# Patient Record
Sex: Female | Born: 1950
Health system: Southern US, Community
[De-identification: ages and names within clinical notes are randomized; demographics above are authoritative.]

## PROBLEM LIST (undated history)

## (undated) ENCOUNTER — Ambulatory Visit: Admission: EM | Source: Home / Self Care

## (undated) DIAGNOSIS — F419 Anxiety disorder, unspecified: Secondary | ICD-10-CM

## (undated) DIAGNOSIS — D649 Anemia, unspecified: Secondary | ICD-10-CM

## (undated) DIAGNOSIS — G4733 Obstructive sleep apnea (adult) (pediatric): Secondary | ICD-10-CM

## (undated) DIAGNOSIS — F32A Depression, unspecified: Secondary | ICD-10-CM

## (undated) DIAGNOSIS — D126 Benign neoplasm of colon, unspecified: Secondary | ICD-10-CM

## (undated) DIAGNOSIS — K589 Irritable bowel syndrome without diarrhea: Secondary | ICD-10-CM

## (undated) DIAGNOSIS — G40909 Epilepsy, unspecified, not intractable, without status epilepticus: Secondary | ICD-10-CM

## (undated) DIAGNOSIS — K2970 Gastritis, unspecified, without bleeding: Secondary | ICD-10-CM

## (undated) DIAGNOSIS — T7840XA Allergy, unspecified, initial encounter: Secondary | ICD-10-CM

## (undated) DIAGNOSIS — E669 Obesity, unspecified: Secondary | ICD-10-CM

## (undated) DIAGNOSIS — G43909 Migraine, unspecified, not intractable, without status migrainosus: Secondary | ICD-10-CM

## (undated) DIAGNOSIS — G4730 Sleep apnea, unspecified: Secondary | ICD-10-CM

## (undated) DIAGNOSIS — I10 Essential (primary) hypertension: Secondary | ICD-10-CM

## (undated) DIAGNOSIS — M797 Fibromyalgia: Secondary | ICD-10-CM

## (undated) DIAGNOSIS — R569 Unspecified convulsions: Secondary | ICD-10-CM

## (undated) DIAGNOSIS — J189 Pneumonia, unspecified organism: Secondary | ICD-10-CM

## (undated) DIAGNOSIS — H269 Unspecified cataract: Secondary | ICD-10-CM

## (undated) DIAGNOSIS — K449 Diaphragmatic hernia without obstruction or gangrene: Secondary | ICD-10-CM

## (undated) DIAGNOSIS — K8020 Calculus of gallbladder without cholecystitis without obstruction: Secondary | ICD-10-CM

## (undated) DIAGNOSIS — F329 Major depressive disorder, single episode, unspecified: Secondary | ICD-10-CM

## (undated) DIAGNOSIS — E039 Hypothyroidism, unspecified: Secondary | ICD-10-CM

## (undated) DIAGNOSIS — K219 Gastro-esophageal reflux disease without esophagitis: Secondary | ICD-10-CM

## (undated) DIAGNOSIS — I219 Acute myocardial infarction, unspecified: Secondary | ICD-10-CM

## (undated) DIAGNOSIS — D1800 Hemangioma unspecified site: Secondary | ICD-10-CM

## (undated) DIAGNOSIS — Z5189 Encounter for other specified aftercare: Secondary | ICD-10-CM

## (undated) DIAGNOSIS — E785 Hyperlipidemia, unspecified: Secondary | ICD-10-CM

## (undated) DIAGNOSIS — M1990 Unspecified osteoarthritis, unspecified site: Secondary | ICD-10-CM

## (undated) DIAGNOSIS — N390 Urinary tract infection, site not specified: Secondary | ICD-10-CM

## (undated) HISTORY — DX: Fibromyalgia: M79.7

## (undated) HISTORY — DX: Anemia, unspecified: D64.9

## (undated) HISTORY — DX: Hemangioma unspecified site: D18.00

## (undated) HISTORY — DX: Benign neoplasm of colon, unspecified: D12.6

## (undated) HISTORY — DX: Pneumonia, unspecified organism: J18.9

## (undated) HISTORY — DX: Calculus of gallbladder without cholecystitis without obstruction: K80.20

## (undated) HISTORY — DX: Allergy, unspecified, initial encounter: T78.40XA

## (undated) HISTORY — DX: Irritable bowel syndrome, unspecified: K58.9

## (undated) HISTORY — DX: Unspecified convulsions: R56.9

## (undated) HISTORY — DX: Major depressive disorder, single episode, unspecified: F32.9

## (undated) HISTORY — DX: Essential (primary) hypertension: I10

## (undated) HISTORY — PX: TOTAL KNEE ARTHROPLASTY: SHX125

## (undated) HISTORY — DX: Migraine, unspecified, not intractable, without status migrainosus: G43.909

## (undated) HISTORY — DX: Sleep apnea, unspecified: G47.30

## (undated) HISTORY — DX: Obesity, unspecified: E66.9

## (undated) HISTORY — DX: Acute myocardial infarction, unspecified: I21.9

## (undated) HISTORY — DX: Encounter for other specified aftercare: Z51.89

## (undated) HISTORY — PX: COLONOSCOPY: SHX174

## (undated) HISTORY — DX: Hyperlipidemia, unspecified: E78.5

## (undated) HISTORY — DX: Urinary tract infection, site not specified: N39.0

## (undated) HISTORY — DX: Obstructive sleep apnea (adult) (pediatric): G47.33

## (undated) HISTORY — DX: Hypothyroidism, unspecified: E03.9

## (undated) HISTORY — DX: Unspecified cataract: H26.9

## (undated) HISTORY — DX: Unspecified osteoarthritis, unspecified site: M19.90

## (undated) HISTORY — DX: Gastro-esophageal reflux disease without esophagitis: K21.9

## (undated) HISTORY — DX: Diaphragmatic hernia without obstruction or gangrene: K44.9

## (undated) HISTORY — DX: Epilepsy, unspecified, not intractable, without status epilepticus: G40.909

## (undated) HISTORY — DX: Gastritis, unspecified, without bleeding: K29.70

## (undated) HISTORY — DX: Depression, unspecified: F32.A

---

## 1983-01-19 HISTORY — PX: CHOLECYSTECTOMY: SHX55

## 1984-01-19 HISTORY — PX: TUBAL LIGATION: SHX77

## 1986-01-18 HISTORY — PX: ABDOMINAL HYSTERECTOMY: SHX81

## 2001-01-06 ENCOUNTER — Encounter: Payer: Self-pay | Admitting: Emergency Medicine

## 2001-01-06 ENCOUNTER — Emergency Department (HOSPITAL_COMMUNITY): Admission: EM | Admit: 2001-01-06 | Discharge: 2001-01-06 | Payer: Self-pay | Admitting: Emergency Medicine

## 2002-01-22 ENCOUNTER — Encounter: Payer: Self-pay | Admitting: Internal Medicine

## 2002-01-22 ENCOUNTER — Encounter: Admission: RE | Admit: 2002-01-22 | Discharge: 2002-01-22 | Payer: Self-pay | Admitting: Internal Medicine

## 2003-04-01 ENCOUNTER — Encounter: Admission: RE | Admit: 2003-04-01 | Discharge: 2003-04-01 | Payer: Self-pay | Admitting: Internal Medicine

## 2003-04-30 ENCOUNTER — Encounter: Admission: RE | Admit: 2003-04-30 | Discharge: 2003-04-30 | Payer: Self-pay | Admitting: Orthopedic Surgery

## 2003-05-09 ENCOUNTER — Ambulatory Visit (HOSPITAL_COMMUNITY): Admission: RE | Admit: 2003-05-09 | Discharge: 2003-05-09 | Payer: Self-pay | Admitting: Orthopedic Surgery

## 2003-05-09 ENCOUNTER — Ambulatory Visit (HOSPITAL_BASED_OUTPATIENT_CLINIC_OR_DEPARTMENT_OTHER): Admission: RE | Admit: 2003-05-09 | Discharge: 2003-05-09 | Payer: Self-pay | Admitting: Orthopedic Surgery

## 2003-12-20 ENCOUNTER — Encounter: Admission: RE | Admit: 2003-12-20 | Discharge: 2003-12-20 | Payer: Self-pay | Admitting: Internal Medicine

## 2004-02-07 ENCOUNTER — Other Ambulatory Visit: Admission: RE | Admit: 2004-02-07 | Discharge: 2004-02-07 | Payer: Self-pay | Admitting: Obstetrics and Gynecology

## 2004-04-09 ENCOUNTER — Encounter: Admission: RE | Admit: 2004-04-09 | Discharge: 2004-04-09 | Payer: Self-pay | Admitting: Internal Medicine

## 2005-01-18 HISTORY — PX: CARDIAC CATHETERIZATION: SHX172

## 2005-02-17 ENCOUNTER — Ambulatory Visit: Payer: Self-pay | Admitting: Internal Medicine

## 2005-04-01 ENCOUNTER — Ambulatory Visit: Payer: Self-pay | Admitting: Internal Medicine

## 2005-06-02 ENCOUNTER — Ambulatory Visit: Payer: Self-pay | Admitting: Internal Medicine

## 2005-06-10 ENCOUNTER — Ambulatory Visit: Payer: Self-pay | Admitting: Internal Medicine

## 2005-08-09 ENCOUNTER — Ambulatory Visit: Payer: Self-pay | Admitting: Internal Medicine

## 2005-08-11 ENCOUNTER — Ambulatory Visit: Payer: Self-pay | Admitting: Internal Medicine

## 2005-09-16 ENCOUNTER — Ambulatory Visit: Payer: Self-pay | Admitting: Internal Medicine

## 2005-10-05 ENCOUNTER — Ambulatory Visit: Payer: Self-pay

## 2005-11-18 HISTORY — PX: TOTAL KNEE ARTHROPLASTY: SHX125

## 2005-11-30 ENCOUNTER — Ambulatory Visit: Payer: Self-pay | Admitting: Internal Medicine

## 2005-11-30 ENCOUNTER — Ambulatory Visit: Payer: Self-pay | Admitting: Pulmonary Disease

## 2005-11-30 ENCOUNTER — Inpatient Hospital Stay (HOSPITAL_COMMUNITY): Admission: RE | Admit: 2005-11-30 | Discharge: 2005-12-08 | Payer: Self-pay | Admitting: Orthopedic Surgery

## 2005-12-06 ENCOUNTER — Ambulatory Visit: Payer: Self-pay | Admitting: Physical Medicine & Rehabilitation

## 2005-12-09 ENCOUNTER — Inpatient Hospital Stay (HOSPITAL_COMMUNITY): Admission: EM | Admit: 2005-12-09 | Discharge: 2005-12-12 | Payer: Self-pay | Admitting: Emergency Medicine

## 2005-12-10 ENCOUNTER — Encounter: Payer: Self-pay | Admitting: Vascular Surgery

## 2005-12-12 ENCOUNTER — Encounter: Payer: Self-pay | Admitting: Internal Medicine

## 2005-12-15 ENCOUNTER — Ambulatory Visit: Payer: Self-pay | Admitting: Gastroenterology

## 2005-12-28 ENCOUNTER — Ambulatory Visit: Payer: Self-pay | Admitting: Gastroenterology

## 2006-01-05 ENCOUNTER — Ambulatory Visit (HOSPITAL_COMMUNITY): Admission: RE | Admit: 2006-01-05 | Discharge: 2006-01-05 | Payer: Self-pay | Admitting: Gastroenterology

## 2006-01-18 DIAGNOSIS — D126 Benign neoplasm of colon, unspecified: Secondary | ICD-10-CM

## 2006-01-18 HISTORY — DX: Benign neoplasm of colon, unspecified: D12.6

## 2006-01-28 ENCOUNTER — Ambulatory Visit: Payer: Self-pay | Admitting: Internal Medicine

## 2006-01-28 LAB — CONVERTED CEMR LAB: Free T4: 0.5 ng/dL — ABNORMAL LOW (ref 0.6–1.6)

## 2006-02-16 ENCOUNTER — Ambulatory Visit: Payer: Self-pay | Admitting: Internal Medicine

## 2006-02-16 LAB — CONVERTED CEMR LAB
Eosinophils Absolute: 0.1 10*3/uL (ref 0.0–0.6)
Eosinophils Relative: 2 % (ref 0.0–5.0)
HCT: 37.7 % (ref 36.0–46.0)
MCV: 86.7 fL (ref 78.0–100.0)
Neutrophils Relative %: 54.6 % (ref 43.0–77.0)
RBC: 4.35 M/uL (ref 3.87–5.11)
RDW: 15.7 % — ABNORMAL HIGH (ref 11.5–14.6)
WBC: 4.6 10*3/uL (ref 4.5–10.5)

## 2006-03-08 ENCOUNTER — Emergency Department (HOSPITAL_COMMUNITY): Admission: EM | Admit: 2006-03-08 | Discharge: 2006-03-08 | Payer: Self-pay | Admitting: Family Medicine

## 2006-05-23 ENCOUNTER — Ambulatory Visit: Payer: Self-pay | Admitting: Internal Medicine

## 2006-05-23 LAB — CONVERTED CEMR LAB
ALT: 17 units/L (ref 0–40)
Basophils Absolute: 0 10*3/uL (ref 0.0–0.1)
Hemoglobin: 13.8 g/dL (ref 12.0–15.0)
Lymphocytes Relative: 33.7 % (ref 12.0–46.0)
MCHC: 33.6 g/dL (ref 30.0–36.0)
MCV: 85.7 fL (ref 78.0–100.0)
Monocytes Absolute: 0.5 10*3/uL (ref 0.2–0.7)
Monocytes Relative: 7.6 % (ref 3.0–11.0)
Neutro Abs: 3.9 10*3/uL (ref 1.4–7.7)

## 2006-07-15 ENCOUNTER — Encounter: Payer: Self-pay | Admitting: Internal Medicine

## 2006-08-01 ENCOUNTER — Encounter (INDEPENDENT_AMBULATORY_CARE_PROVIDER_SITE_OTHER): Payer: Self-pay | Admitting: *Deleted

## 2006-08-09 ENCOUNTER — Emergency Department (HOSPITAL_COMMUNITY): Admission: EM | Admit: 2006-08-09 | Discharge: 2006-08-09 | Payer: Self-pay | Admitting: Emergency Medicine

## 2006-08-09 ENCOUNTER — Telehealth (INDEPENDENT_AMBULATORY_CARE_PROVIDER_SITE_OTHER): Payer: Self-pay | Admitting: *Deleted

## 2006-08-31 ENCOUNTER — Ambulatory Visit: Payer: Self-pay | Admitting: Gastroenterology

## 2006-09-02 ENCOUNTER — Encounter: Admission: RE | Admit: 2006-09-02 | Discharge: 2006-09-02 | Payer: Self-pay | Admitting: Gastroenterology

## 2006-09-21 ENCOUNTER — Encounter: Payer: Self-pay | Admitting: Internal Medicine

## 2006-09-21 ENCOUNTER — Encounter: Payer: Self-pay | Admitting: Gastroenterology

## 2006-09-21 ENCOUNTER — Ambulatory Visit: Payer: Self-pay | Admitting: Gastroenterology

## 2006-10-27 ENCOUNTER — Ambulatory Visit: Payer: Self-pay | Admitting: Internal Medicine

## 2006-10-27 ENCOUNTER — Telehealth (INDEPENDENT_AMBULATORY_CARE_PROVIDER_SITE_OTHER): Payer: Self-pay | Admitting: *Deleted

## 2006-11-11 ENCOUNTER — Telehealth (INDEPENDENT_AMBULATORY_CARE_PROVIDER_SITE_OTHER): Payer: Self-pay | Admitting: *Deleted

## 2006-11-15 ENCOUNTER — Ambulatory Visit: Payer: Self-pay | Admitting: Internal Medicine

## 2006-11-18 ENCOUNTER — Ambulatory Visit: Payer: Self-pay | Admitting: Internal Medicine

## 2006-11-21 ENCOUNTER — Telehealth (INDEPENDENT_AMBULATORY_CARE_PROVIDER_SITE_OTHER): Payer: Self-pay | Admitting: *Deleted

## 2007-02-14 ENCOUNTER — Telehealth (INDEPENDENT_AMBULATORY_CARE_PROVIDER_SITE_OTHER): Payer: Self-pay | Admitting: *Deleted

## 2007-04-26 DIAGNOSIS — R569 Unspecified convulsions: Secondary | ICD-10-CM

## 2007-04-26 DIAGNOSIS — D1800 Hemangioma unspecified site: Secondary | ICD-10-CM | POA: Insufficient documentation

## 2007-04-26 DIAGNOSIS — E785 Hyperlipidemia, unspecified: Secondary | ICD-10-CM

## 2007-04-26 DIAGNOSIS — F329 Major depressive disorder, single episode, unspecified: Secondary | ICD-10-CM

## 2007-04-26 DIAGNOSIS — K449 Diaphragmatic hernia without obstruction or gangrene: Secondary | ICD-10-CM | POA: Insufficient documentation

## 2007-04-26 DIAGNOSIS — G4733 Obstructive sleep apnea (adult) (pediatric): Secondary | ICD-10-CM

## 2007-04-26 DIAGNOSIS — M129 Arthropathy, unspecified: Secondary | ICD-10-CM | POA: Insufficient documentation

## 2007-04-26 DIAGNOSIS — K7689 Other specified diseases of liver: Secondary | ICD-10-CM

## 2007-04-26 DIAGNOSIS — E669 Obesity, unspecified: Secondary | ICD-10-CM

## 2007-08-03 ENCOUNTER — Ambulatory Visit: Payer: Self-pay | Admitting: Internal Medicine

## 2007-08-03 DIAGNOSIS — R1011 Right upper quadrant pain: Secondary | ICD-10-CM

## 2007-08-03 LAB — CONVERTED CEMR LAB
Ketones, urine, test strip: NEGATIVE
Nitrite: NEGATIVE
Specific Gravity, Urine: 1.015
Urobilinogen, UA: 0.2

## 2007-08-04 ENCOUNTER — Encounter: Payer: Self-pay | Admitting: Internal Medicine

## 2007-08-04 ENCOUNTER — Encounter: Admission: RE | Admit: 2007-08-04 | Discharge: 2007-08-04 | Payer: Self-pay | Admitting: Internal Medicine

## 2007-08-04 ENCOUNTER — Telehealth (INDEPENDENT_AMBULATORY_CARE_PROVIDER_SITE_OTHER): Payer: Self-pay | Admitting: *Deleted

## 2007-08-04 LAB — CONVERTED CEMR LAB
ALT: 16 units/L (ref 0–35)
AST: 18 units/L (ref 0–37)
BUN: 8 mg/dL (ref 6–23)
Bilirubin, Direct: 0.1 mg/dL (ref 0.0–0.3)
Hemoglobin: 12.9 g/dL (ref 12.0–15.0)
MCHC: 33.5 g/dL (ref 30.0–36.0)
MCV: 86 fL (ref 78.0–100.0)
RBC: 4.47 M/uL (ref 3.87–5.11)
Total Protein: 7 g/dL (ref 6.0–8.3)
WBC: 7 10*3/uL (ref 4.5–10.5)

## 2007-08-05 ENCOUNTER — Encounter: Payer: Self-pay | Admitting: Internal Medicine

## 2007-08-07 ENCOUNTER — Encounter (INDEPENDENT_AMBULATORY_CARE_PROVIDER_SITE_OTHER): Payer: Self-pay | Admitting: *Deleted

## 2007-08-07 ENCOUNTER — Ambulatory Visit: Payer: Self-pay | Admitting: Gastroenterology

## 2007-08-07 ENCOUNTER — Telehealth (INDEPENDENT_AMBULATORY_CARE_PROVIDER_SITE_OTHER): Payer: Self-pay | Admitting: *Deleted

## 2007-08-07 DIAGNOSIS — R079 Chest pain, unspecified: Secondary | ICD-10-CM | POA: Insufficient documentation

## 2007-08-07 DIAGNOSIS — Z860100 Personal history of colon polyps, unspecified: Secondary | ICD-10-CM | POA: Insufficient documentation

## 2007-08-08 ENCOUNTER — Telehealth (INDEPENDENT_AMBULATORY_CARE_PROVIDER_SITE_OTHER): Payer: Self-pay | Admitting: *Deleted

## 2007-08-09 ENCOUNTER — Encounter (INDEPENDENT_AMBULATORY_CARE_PROVIDER_SITE_OTHER): Payer: Self-pay | Admitting: *Deleted

## 2007-08-09 ENCOUNTER — Ambulatory Visit: Payer: Self-pay | Admitting: Internal Medicine

## 2007-08-09 LAB — CONVERTED CEMR LAB
OCCULT 1: NEGATIVE
OCCULT 3: NEGATIVE

## 2007-08-12 ENCOUNTER — Encounter: Admission: RE | Admit: 2007-08-12 | Discharge: 2007-08-12 | Payer: Self-pay | Admitting: Gastroenterology

## 2007-08-16 ENCOUNTER — Encounter: Payer: Self-pay | Admitting: Gastroenterology

## 2007-08-16 ENCOUNTER — Ambulatory Visit: Payer: Self-pay | Admitting: Gastroenterology

## 2007-08-17 ENCOUNTER — Telehealth: Payer: Self-pay | Admitting: Gastroenterology

## 2007-08-18 ENCOUNTER — Encounter: Payer: Self-pay | Admitting: Gastroenterology

## 2007-08-23 ENCOUNTER — Telehealth (INDEPENDENT_AMBULATORY_CARE_PROVIDER_SITE_OTHER): Payer: Self-pay | Admitting: *Deleted

## 2007-10-01 IMAGING — CT CT ANGIO CHEST
2 of 6 series · 14 of 36 positions shown · IV contrast (APPLIED)
Comparison: Chest radiograph of 12/09/2005

CLINICAL DATA: Chest pain and shortness of breath.

CT ANGIOGRAPHY OF CHEST - PULMONARY EMBOLISM PROTOCOL
TECHNIQUE: Multidetector CT imaging of the chest was performed according to the
protocol for detection of pulmonary embolism during bolus injection of
intravenous contrast.  Coronal and sagittal plane CT angiographic image
reconstructions were also generated.
Contrast:  Initially, 100 cc Pmnipaque-0GG was administered intravenously. This
resulted in a suboptimal bolus. Repeat bolus was performed using 75 cc of
Pmnipaque-0GG.

[Series 4: pulm embolism 2.0 st · axial · 0.72mm/px · z∈[-284,-62]mm · 11 of 135 slices shown]
[im 12/135  lung]
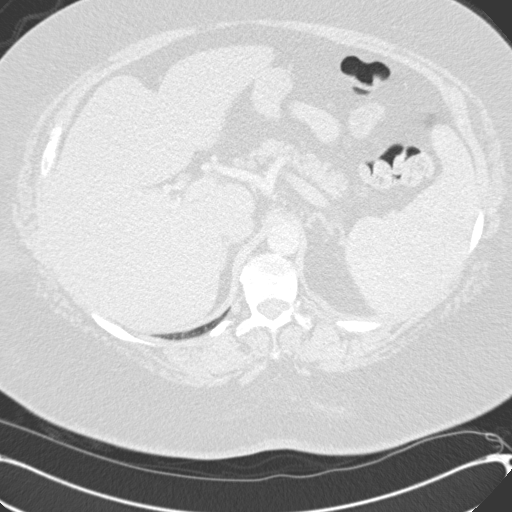
[im 23/135  mediastinal]
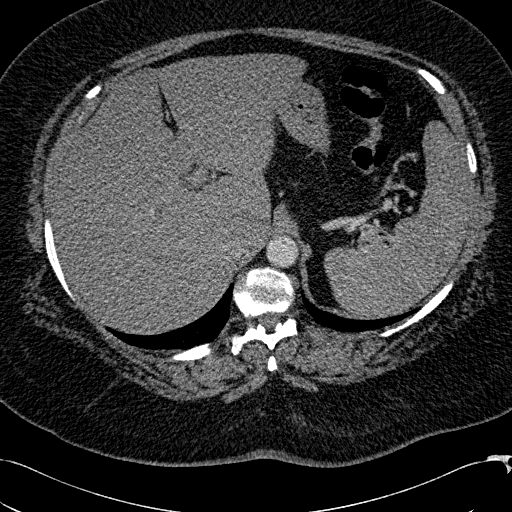
[im 34/135  lung]
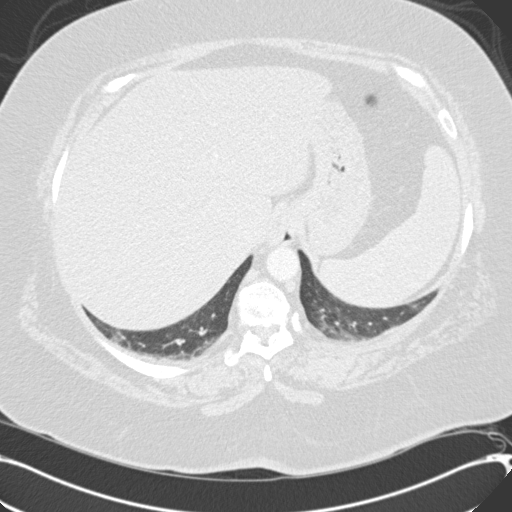
[im 45/135  mediastinal]
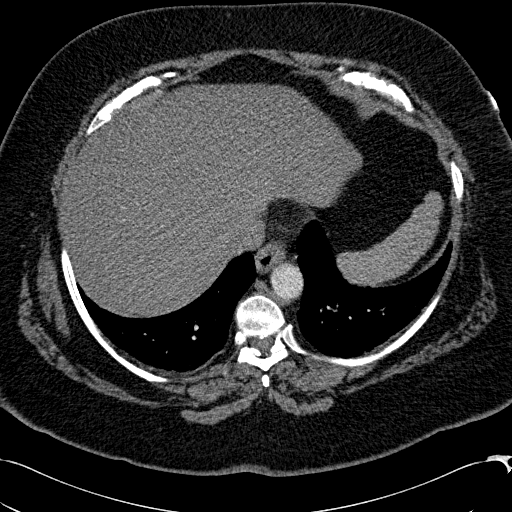
[im 56/135  lung]
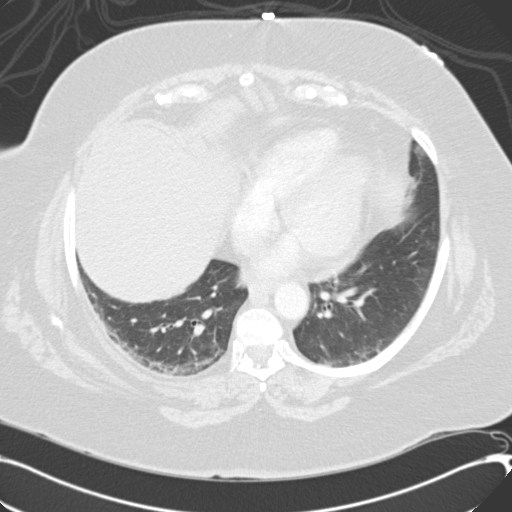
[im 68/135  mediastinal]
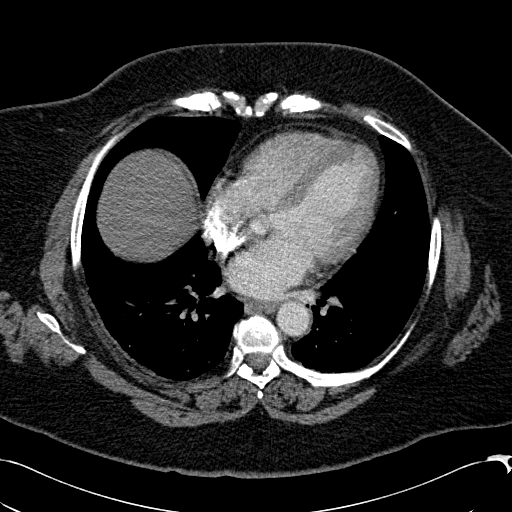
[im 79/135  lung]
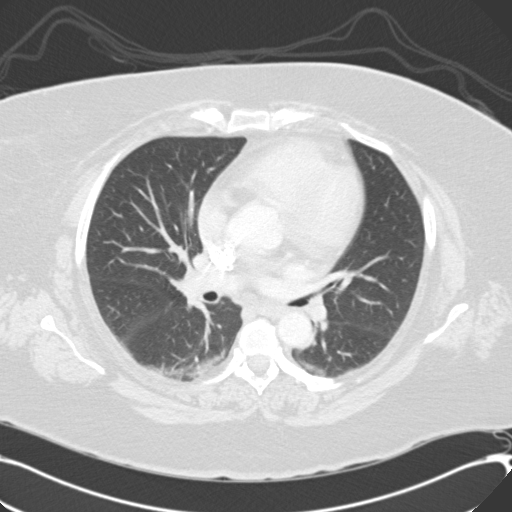
[im 90/135  mediastinal]
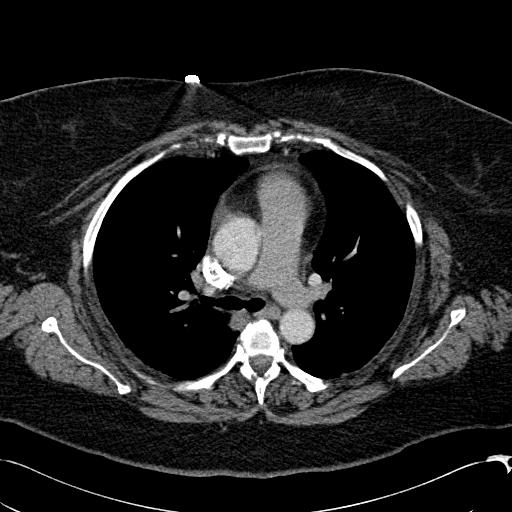
[im 101/135  lung]
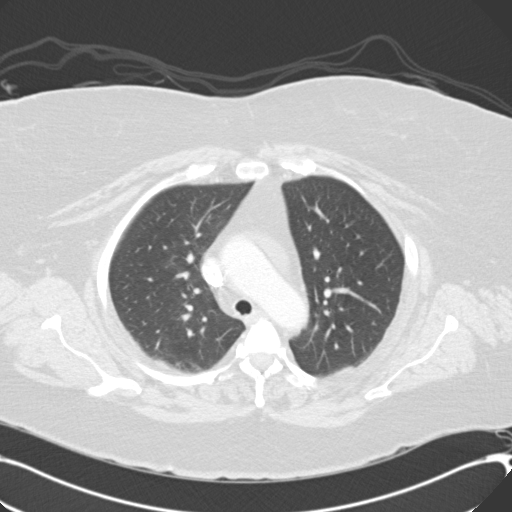
[im 112/135  mediastinal]
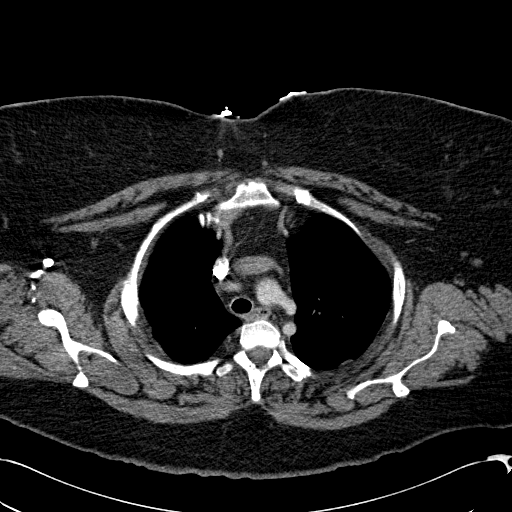
[im 123/135  lung]
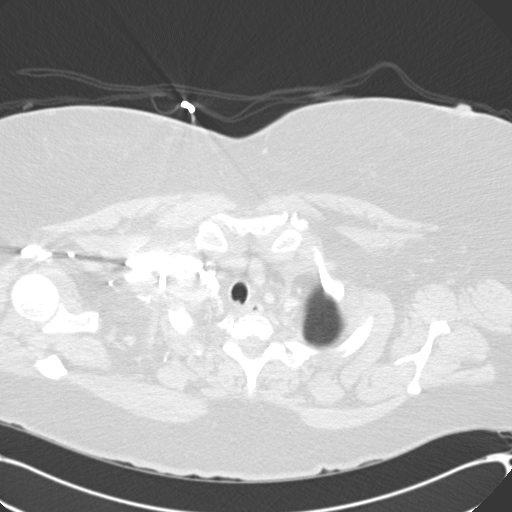

[Series 10: pulm embolism 2.0 cor · coronal · 0.63mm/px · 3 of 123 slices shown]
[im 25/123  mediastinal]
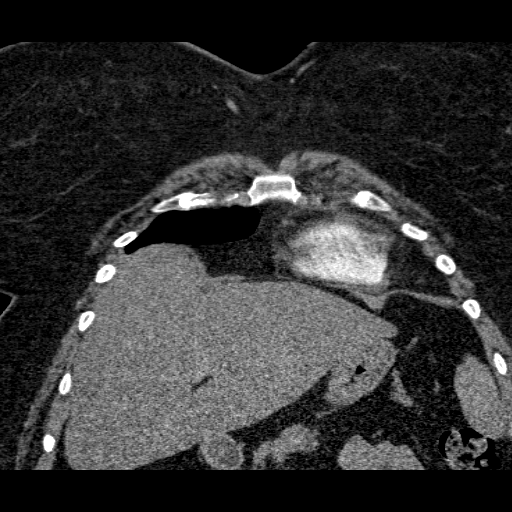
[im 49/123  mediastinal]
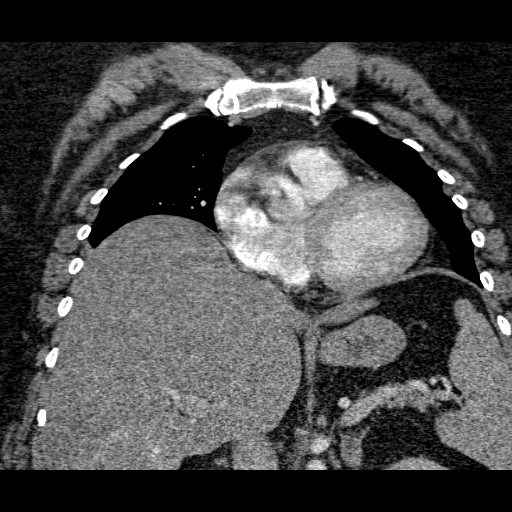
[im 74/123  mediastinal]
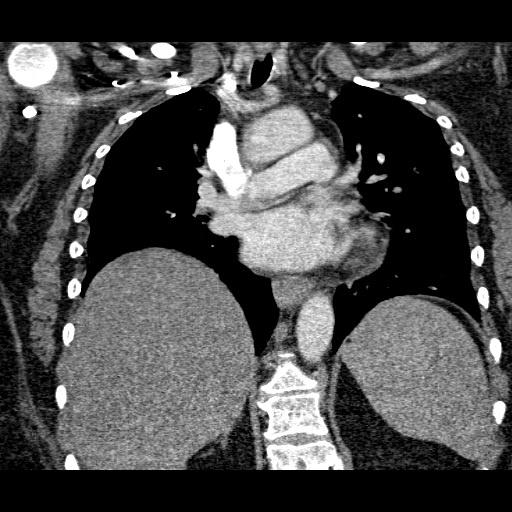

[14 of 36 positions shown; findings below may reference images not displayed]

FINDINGS: No filling defect is identified in the pulmonary arterial tree to
suggest pulmonary embolus. As shown on the prior pulmonary embolus scan of one
week ago, is a mass in the right hepatic lobe which requires MRI followup for
further characterization. Mild bibasilar subsegmental atelectasis is present.

IMPRESSION

1. No pulmonary bolus detected.
2. As shown on last week's pulmonary embolus CT scan, there is a mass inferiorly
in the right hepatic lobe which will require MRI followup to exclude malignancy.

3. Mild bibasilar subsegmental atelectasis.

## 2007-10-23 ENCOUNTER — Telehealth (INDEPENDENT_AMBULATORY_CARE_PROVIDER_SITE_OTHER): Payer: Self-pay | Admitting: *Deleted

## 2007-11-15 ENCOUNTER — Telehealth (INDEPENDENT_AMBULATORY_CARE_PROVIDER_SITE_OTHER): Payer: Self-pay | Admitting: *Deleted

## 2007-11-22 ENCOUNTER — Ambulatory Visit: Payer: Self-pay | Admitting: Family Medicine

## 2007-11-22 ENCOUNTER — Telehealth (INDEPENDENT_AMBULATORY_CARE_PROVIDER_SITE_OTHER): Payer: Self-pay | Admitting: *Deleted

## 2007-11-22 DIAGNOSIS — B370 Candidal stomatitis: Secondary | ICD-10-CM

## 2007-11-22 DIAGNOSIS — J029 Acute pharyngitis, unspecified: Secondary | ICD-10-CM

## 2007-11-27 ENCOUNTER — Telehealth (INDEPENDENT_AMBULATORY_CARE_PROVIDER_SITE_OTHER): Payer: Self-pay | Admitting: *Deleted

## 2007-12-29 ENCOUNTER — Encounter: Payer: Self-pay | Admitting: Internal Medicine

## 2008-01-01 ENCOUNTER — Encounter: Payer: Self-pay | Admitting: Internal Medicine

## 2008-01-02 ENCOUNTER — Ambulatory Visit: Payer: Self-pay | Admitting: Family Medicine

## 2008-01-02 LAB — CONVERTED CEMR LAB: Glucose, Bld: 454 mg/dL

## 2008-01-04 ENCOUNTER — Telehealth: Payer: Self-pay | Admitting: Internal Medicine

## 2008-01-05 ENCOUNTER — Ambulatory Visit: Payer: Self-pay | Admitting: Internal Medicine

## 2008-01-09 ENCOUNTER — Telehealth (INDEPENDENT_AMBULATORY_CARE_PROVIDER_SITE_OTHER): Payer: Self-pay | Admitting: *Deleted

## 2008-01-09 LAB — CONVERTED CEMR LAB
ALT: 22 units/L (ref 0–35)
AST: 21 units/L (ref 0–37)
Albumin: 3.3 g/dL — ABNORMAL LOW (ref 3.5–5.2)
Alkaline Phosphatase: 71 units/L (ref 39–117)
BUN: 12 mg/dL (ref 6–23)
Bilirubin, Direct: 0.1 mg/dL (ref 0.0–0.3)
CO2: 29 meq/L (ref 19–32)
Chloride: 104 meq/L (ref 96–112)
Cholesterol: 128 mg/dL (ref 0–200)
GFR calc Af Amer: 133 mL/min
Glucose, Bld: 337 mg/dL — ABNORMAL HIGH (ref 70–99)
Potassium: 4.1 meq/L (ref 3.5–5.1)
Sodium: 139 meq/L (ref 135–145)
Total Protein: 5.9 g/dL — ABNORMAL LOW (ref 6.0–8.3)
VLDL: 26 mg/dL (ref 0–40)

## 2008-01-11 ENCOUNTER — Encounter (INDEPENDENT_AMBULATORY_CARE_PROVIDER_SITE_OTHER): Payer: Self-pay | Admitting: *Deleted

## 2008-01-19 DIAGNOSIS — I219 Acute myocardial infarction, unspecified: Secondary | ICD-10-CM

## 2008-01-19 DIAGNOSIS — E039 Hypothyroidism, unspecified: Secondary | ICD-10-CM

## 2008-01-19 HISTORY — DX: Hypothyroidism, unspecified: E03.9

## 2008-01-19 HISTORY — DX: Acute myocardial infarction, unspecified: I21.9

## 2008-01-24 ENCOUNTER — Telehealth (INDEPENDENT_AMBULATORY_CARE_PROVIDER_SITE_OTHER): Payer: Self-pay | Admitting: *Deleted

## 2008-01-24 ENCOUNTER — Encounter: Payer: Self-pay | Admitting: Internal Medicine

## 2008-02-21 ENCOUNTER — Telehealth (INDEPENDENT_AMBULATORY_CARE_PROVIDER_SITE_OTHER): Payer: Self-pay | Admitting: *Deleted

## 2008-03-01 ENCOUNTER — Telehealth (INDEPENDENT_AMBULATORY_CARE_PROVIDER_SITE_OTHER): Payer: Self-pay | Admitting: *Deleted

## 2008-03-19 ENCOUNTER — Ambulatory Visit: Payer: Self-pay | Admitting: Internal Medicine

## 2008-03-19 DIAGNOSIS — E039 Hypothyroidism, unspecified: Secondary | ICD-10-CM

## 2008-03-28 ENCOUNTER — Telehealth (INDEPENDENT_AMBULATORY_CARE_PROVIDER_SITE_OTHER): Payer: Self-pay | Admitting: *Deleted

## 2008-03-28 LAB — CONVERTED CEMR LAB
Hgb A1c MFr Bld: 6.6 % — ABNORMAL HIGH (ref 4.6–6.0)
Microalb Creat Ratio: 12.8 mg/g (ref 0.0–30.0)
Microalb, Ur: 0.8 mg/dL (ref 0.0–1.9)
TSH: 2.47 microintl units/mL (ref 0.35–5.50)

## 2008-04-23 ENCOUNTER — Encounter: Payer: Self-pay | Admitting: Internal Medicine

## 2008-05-01 ENCOUNTER — Encounter: Payer: Self-pay | Admitting: Internal Medicine

## 2008-05-24 ENCOUNTER — Telehealth (INDEPENDENT_AMBULATORY_CARE_PROVIDER_SITE_OTHER): Payer: Self-pay | Admitting: *Deleted

## 2008-07-15 ENCOUNTER — Telehealth (INDEPENDENT_AMBULATORY_CARE_PROVIDER_SITE_OTHER): Payer: Self-pay | Admitting: *Deleted

## 2008-07-23 ENCOUNTER — Encounter: Payer: Self-pay | Admitting: Gastroenterology

## 2008-07-31 ENCOUNTER — Ambulatory Visit: Payer: Self-pay | Admitting: Gastroenterology

## 2008-07-31 LAB — CONVERTED CEMR LAB
BUN: 9 mg/dL (ref 6–23)
Creatinine, Ser: 0.7 mg/dL (ref 0.4–1.2)

## 2008-08-01 ENCOUNTER — Encounter: Admission: RE | Admit: 2008-08-01 | Discharge: 2008-08-01 | Payer: Self-pay | Admitting: Gastroenterology

## 2008-09-03 ENCOUNTER — Encounter: Payer: Self-pay | Admitting: Internal Medicine

## 2008-09-03 ENCOUNTER — Telehealth (INDEPENDENT_AMBULATORY_CARE_PROVIDER_SITE_OTHER): Payer: Self-pay | Admitting: *Deleted

## 2008-10-04 ENCOUNTER — Ambulatory Visit: Payer: Self-pay | Admitting: Internal Medicine

## 2008-10-04 DIAGNOSIS — I252 Old myocardial infarction: Secondary | ICD-10-CM | POA: Insufficient documentation

## 2008-10-04 DIAGNOSIS — I214 Non-ST elevation (NSTEMI) myocardial infarction: Secondary | ICD-10-CM | POA: Insufficient documentation

## 2008-10-07 ENCOUNTER — Ambulatory Visit: Payer: Self-pay | Admitting: Internal Medicine

## 2008-10-08 ENCOUNTER — Encounter (INDEPENDENT_AMBULATORY_CARE_PROVIDER_SITE_OTHER): Payer: Self-pay | Admitting: *Deleted

## 2008-10-08 LAB — CONVERTED CEMR LAB
ALT: 12 units/L (ref 0–35)
BUN: 9 mg/dL (ref 6–23)
Basophils Absolute: 0 10*3/uL (ref 0.0–0.1)
Bilirubin, Direct: 0 mg/dL (ref 0.0–0.3)
Calcium: 8.8 mg/dL (ref 8.4–10.5)
Chloride: 104 meq/L (ref 96–112)
Cholesterol: 144 mg/dL (ref 0–200)
Creatinine, Ser: 0.7 mg/dL (ref 0.4–1.2)
Eosinophils Absolute: 0.2 10*3/uL (ref 0.0–0.7)
HCT: 36.3 % (ref 36.0–46.0)
HDL: 49.3 mg/dL (ref >39.00)
Hemoglobin: 12.1 g/dL (ref 12.0–15.0)
Hgb A1c MFr Bld: 6 % (ref 4.6–6.5)
LDL Cholesterol: 79 mg/dL (ref 0–99)
Lymphs Abs: 2.6 10*3/uL (ref 0.7–4.0)
MCHC: 33.5 g/dL (ref 30.0–36.0)
Monocytes Relative: 8.3 % (ref 3.0–12.0)
Neutro Abs: 3.4 10*3/uL (ref 1.4–7.7)
Platelets: 250 10*3/uL (ref 150.0–400.0)
RDW: 17.3 % — ABNORMAL HIGH (ref 11.5–14.6)
Total Bilirubin: 0.6 mg/dL (ref 0.3–1.2)
Total CHOL/HDL Ratio: 3
Total Protein: 6.3 g/dL (ref 6.0–8.3)
Triglycerides: 78 mg/dL (ref 0.0–149.0)

## 2008-10-09 ENCOUNTER — Encounter: Admission: RE | Admit: 2008-10-09 | Discharge: 2008-10-09 | Payer: Self-pay | Admitting: Orthopedic Surgery

## 2008-10-25 ENCOUNTER — Telehealth (INDEPENDENT_AMBULATORY_CARE_PROVIDER_SITE_OTHER): Payer: Self-pay | Admitting: *Deleted

## 2008-11-01 ENCOUNTER — Telehealth (INDEPENDENT_AMBULATORY_CARE_PROVIDER_SITE_OTHER): Payer: Self-pay | Admitting: *Deleted

## 2008-11-07 ENCOUNTER — Inpatient Hospital Stay (HOSPITAL_COMMUNITY): Admission: RE | Admit: 2008-11-07 | Discharge: 2008-11-12 | Payer: Self-pay | Admitting: Orthopedic Surgery

## 2008-11-11 ENCOUNTER — Ambulatory Visit: Payer: Self-pay | Admitting: Vascular Surgery

## 2008-11-11 ENCOUNTER — Ambulatory Visit: Payer: Self-pay | Admitting: Physical Medicine & Rehabilitation

## 2008-11-11 ENCOUNTER — Encounter (INDEPENDENT_AMBULATORY_CARE_PROVIDER_SITE_OTHER): Payer: Self-pay | Admitting: Orthopedic Surgery

## 2008-12-16 ENCOUNTER — Telehealth: Payer: Self-pay | Admitting: Internal Medicine

## 2008-12-18 HISTORY — PX: OTHER SURGICAL HISTORY: SHX169

## 2008-12-25 ENCOUNTER — Encounter: Payer: Self-pay | Admitting: Internal Medicine

## 2009-02-21 ENCOUNTER — Telehealth (INDEPENDENT_AMBULATORY_CARE_PROVIDER_SITE_OTHER): Payer: Self-pay | Admitting: *Deleted

## 2009-02-21 ENCOUNTER — Ambulatory Visit: Payer: Self-pay | Admitting: Internal Medicine

## 2009-02-21 ENCOUNTER — Telehealth: Payer: Self-pay | Admitting: Internal Medicine

## 2009-02-22 ENCOUNTER — Ambulatory Visit: Payer: Self-pay | Admitting: Family Medicine

## 2009-02-22 DIAGNOSIS — R252 Cramp and spasm: Secondary | ICD-10-CM

## 2009-02-24 ENCOUNTER — Ambulatory Visit: Payer: Self-pay | Admitting: Internal Medicine

## 2009-02-24 DIAGNOSIS — M79609 Pain in unspecified limb: Secondary | ICD-10-CM | POA: Insufficient documentation

## 2009-02-24 DIAGNOSIS — E559 Vitamin D deficiency, unspecified: Secondary | ICD-10-CM | POA: Insufficient documentation

## 2009-02-25 ENCOUNTER — Encounter: Payer: Self-pay | Admitting: Internal Medicine

## 2009-02-28 ENCOUNTER — Encounter: Payer: Self-pay | Admitting: Internal Medicine

## 2009-02-28 ENCOUNTER — Ambulatory Visit: Payer: Self-pay

## 2009-03-03 ENCOUNTER — Telehealth (INDEPENDENT_AMBULATORY_CARE_PROVIDER_SITE_OTHER): Payer: Self-pay | Admitting: *Deleted

## 2009-03-11 ENCOUNTER — Encounter: Admission: RE | Admit: 2009-03-11 | Discharge: 2009-03-11 | Payer: Self-pay | Admitting: Internal Medicine

## 2009-03-11 LAB — HM MAMMOGRAPHY: HM Mammogram: NEGATIVE

## 2009-04-25 ENCOUNTER — Ambulatory Visit: Payer: Self-pay | Admitting: Family Medicine

## 2009-04-25 DIAGNOSIS — R609 Edema, unspecified: Secondary | ICD-10-CM | POA: Insufficient documentation

## 2009-04-25 DIAGNOSIS — R21 Rash and other nonspecific skin eruption: Secondary | ICD-10-CM

## 2009-04-26 LAB — CONVERTED CEMR LAB
BUN: 9 mg/dL (ref 6–23)
CO2: 29 meq/L (ref 19–32)
Chloride: 99 meq/L (ref 96–112)
Glucose, Bld: 181 mg/dL — ABNORMAL HIGH (ref 70–99)
Potassium: 4.3 meq/L (ref 3.5–5.3)
Sodium: 137 meq/L (ref 135–145)

## 2009-05-06 ENCOUNTER — Telehealth (INDEPENDENT_AMBULATORY_CARE_PROVIDER_SITE_OTHER): Payer: Self-pay | Admitting: *Deleted

## 2009-05-07 ENCOUNTER — Encounter: Payer: Self-pay | Admitting: Internal Medicine

## 2009-05-08 ENCOUNTER — Ambulatory Visit: Payer: Self-pay | Admitting: Family Medicine

## 2009-05-12 LAB — CONVERTED CEMR LAB
BUN: 7 mg/dL (ref 6–23)
CO2: 31 meq/L (ref 19–32)
Calcium: 9.5 mg/dL (ref 8.4–10.5)
Chloride: 103 meq/L (ref 96–112)
Creatinine, Ser: 0.6 mg/dL (ref 0.4–1.2)
Glucose, Bld: 128 mg/dL — ABNORMAL HIGH (ref 70–99)

## 2009-05-19 ENCOUNTER — Encounter: Payer: Self-pay | Admitting: Family Medicine

## 2009-06-06 ENCOUNTER — Ambulatory Visit: Payer: Self-pay | Admitting: Internal Medicine

## 2009-06-09 ENCOUNTER — Ambulatory Visit: Payer: Self-pay | Admitting: Internal Medicine

## 2009-06-11 LAB — CONVERTED CEMR LAB
Albumin: 3.3 g/dL — ABNORMAL LOW (ref 3.5–5.2)
Alkaline Phosphatase: 66 units/L (ref 39–117)
Bilirubin, Direct: 0.1 mg/dL (ref 0.0–0.3)
LDL Cholesterol: 88 mg/dL (ref 0–99)
Total Bilirubin: 0.3 mg/dL (ref 0.3–1.2)
Total CHOL/HDL Ratio: 3
Triglycerides: 94 mg/dL (ref 0.0–149.0)
Vit D, 25-Hydroxy: 45 ng/mL (ref 30–89)

## 2009-06-20 ENCOUNTER — Telehealth (INDEPENDENT_AMBULATORY_CARE_PROVIDER_SITE_OTHER): Payer: Self-pay | Admitting: *Deleted

## 2009-07-09 ENCOUNTER — Encounter: Payer: Self-pay | Admitting: Internal Medicine

## 2009-07-15 ENCOUNTER — Telehealth (INDEPENDENT_AMBULATORY_CARE_PROVIDER_SITE_OTHER): Payer: Self-pay | Admitting: *Deleted

## 2009-07-18 ENCOUNTER — Telehealth (INDEPENDENT_AMBULATORY_CARE_PROVIDER_SITE_OTHER): Payer: Self-pay

## 2009-07-22 ENCOUNTER — Ambulatory Visit: Payer: Self-pay | Admitting: Gastroenterology

## 2009-07-22 LAB — CONVERTED CEMR LAB
BUN: 9 mg/dL (ref 6–23)
Creatinine, Ser: 0.6 mg/dL (ref 0.4–1.2)

## 2009-07-31 ENCOUNTER — Telehealth (INDEPENDENT_AMBULATORY_CARE_PROVIDER_SITE_OTHER): Payer: Self-pay

## 2009-08-04 ENCOUNTER — Encounter: Admission: RE | Admit: 2009-08-04 | Discharge: 2009-08-04 | Payer: Self-pay | Admitting: Gastroenterology

## 2009-08-09 ENCOUNTER — Encounter: Admission: RE | Admit: 2009-08-09 | Discharge: 2009-08-09 | Payer: Self-pay | Admitting: Gastroenterology

## 2009-08-25 ENCOUNTER — Telehealth (INDEPENDENT_AMBULATORY_CARE_PROVIDER_SITE_OTHER): Payer: Self-pay | Admitting: *Deleted

## 2009-09-16 ENCOUNTER — Ambulatory Visit: Payer: Self-pay | Admitting: Internal Medicine

## 2009-09-16 DIAGNOSIS — R42 Dizziness and giddiness: Secondary | ICD-10-CM

## 2009-09-16 DIAGNOSIS — R197 Diarrhea, unspecified: Secondary | ICD-10-CM

## 2009-09-16 LAB — CONVERTED CEMR LAB: LDL Goal: 70 mg/dL

## 2009-09-23 LAB — CONVERTED CEMR LAB
Albumin: 3.5 g/dL (ref 3.5–5.2)
BUN: 11 mg/dL (ref 6–23)
Basophils Absolute: 0.1 10*3/uL (ref 0.0–0.1)
Eosinophils Absolute: 0.1 10*3/uL (ref 0.0–0.7)
HCT: 36.4 % (ref 36.0–46.0)
Hemoglobin: 12 g/dL (ref 12.0–15.0)
Lymphocytes Relative: 40.4 % (ref 12.0–46.0)
Lymphs Abs: 2.9 10*3/uL (ref 0.7–4.0)
MCHC: 32.9 g/dL (ref 30.0–36.0)
Neutro Abs: 3.7 10*3/uL (ref 1.4–7.7)
Platelets: 264 10*3/uL (ref 150.0–400.0)
Potassium: 4.2 meq/L (ref 3.5–5.1)
RDW: 18.5 % — ABNORMAL HIGH (ref 11.5–14.6)
TSH: 2.36 microintl units/mL (ref 0.35–5.50)

## 2009-11-06 ENCOUNTER — Ambulatory Visit: Payer: Self-pay | Admitting: Family Medicine

## 2009-11-13 ENCOUNTER — Encounter: Payer: Self-pay | Admitting: Internal Medicine

## 2009-11-19 ENCOUNTER — Telehealth (INDEPENDENT_AMBULATORY_CARE_PROVIDER_SITE_OTHER): Payer: Self-pay | Admitting: *Deleted

## 2009-11-25 ENCOUNTER — Telehealth (INDEPENDENT_AMBULATORY_CARE_PROVIDER_SITE_OTHER): Payer: Self-pay | Admitting: *Deleted

## 2010-01-05 ENCOUNTER — Encounter: Payer: Self-pay | Admitting: Gastroenterology

## 2010-01-05 ENCOUNTER — Ambulatory Visit: Payer: Self-pay | Admitting: Gastroenterology

## 2010-01-05 DIAGNOSIS — I10 Essential (primary) hypertension: Secondary | ICD-10-CM

## 2010-01-05 DIAGNOSIS — E1159 Type 2 diabetes mellitus with other circulatory complications: Secondary | ICD-10-CM | POA: Insufficient documentation

## 2010-01-05 DIAGNOSIS — D649 Anemia, unspecified: Secondary | ICD-10-CM | POA: Insufficient documentation

## 2010-01-05 DIAGNOSIS — K589 Irritable bowel syndrome without diarrhea: Secondary | ICD-10-CM

## 2010-01-06 LAB — CONVERTED CEMR LAB: Tissue Transglutaminase Ab, IgA: 5.9 units (ref <20)

## 2010-01-15 ENCOUNTER — Encounter: Payer: Self-pay | Admitting: Gastroenterology

## 2010-01-18 HISTORY — PX: POLYPECTOMY: SHX149

## 2010-01-22 ENCOUNTER — Encounter: Payer: Self-pay | Admitting: Internal Medicine

## 2010-02-06 ENCOUNTER — Ambulatory Visit
Admission: RE | Admit: 2010-02-06 | Discharge: 2010-02-06 | Payer: Self-pay | Source: Home / Self Care | Attending: Gastroenterology | Admitting: Gastroenterology

## 2010-02-06 ENCOUNTER — Other Ambulatory Visit: Payer: Self-pay | Admitting: Gastroenterology

## 2010-02-06 LAB — FECAL OCCULT BLOOD, IMMUNOCHEMICAL: Fecal Occult Bld: NEGATIVE

## 2010-02-07 ENCOUNTER — Other Ambulatory Visit: Payer: Self-pay | Admitting: Internal Medicine

## 2010-02-07 DIAGNOSIS — Z1239 Encounter for other screening for malignant neoplasm of breast: Secondary | ICD-10-CM

## 2010-02-08 ENCOUNTER — Encounter: Payer: Self-pay | Admitting: Gastroenterology

## 2010-02-17 ENCOUNTER — Encounter: Payer: Self-pay | Admitting: Gastroenterology

## 2010-02-17 ENCOUNTER — Ambulatory Visit
Admission: RE | Admit: 2010-02-17 | Discharge: 2010-02-17 | Payer: Self-pay | Source: Home / Self Care | Attending: Gastroenterology | Admitting: Gastroenterology

## 2010-02-17 NOTE — Assessment & Plan Note (Signed)
Summary: recheck edema/cbs   Vital Signs:  Patient profile:   60 year old female Height:      63.75 inches Weight:      292.0 pounds BMI:     50.70 Pulse rate:   70 / minute BP sitting:   110 / 68  Vitals Entered By: Kandice Hams (May 08, 2009 1:04 PM) CC: c/o edema feet and legs, mostly left    History of Present Illness: 60 yo woman here today to f/u on edema.  reports edema is much improved.  no pain from swelling.  tolerating lasix w/out difficulty.  down 7 lbs from last visit.  Current Medications (verified): 1)  Simvastatin 20 Mg Tabs (Simvastatin) .[name].. 1 By Mouth Once Daily Pt Due For Labs Now 2)  Cymbalta 60 Mg  Cpep (Duloxetine Hcl) .[name].. 1 By Mouth Once Daily 3)  Dilt-Xr 240 Mg  Cp24 (Diltiazem Hcl) .[name].. 1 By Mouth Qd 4)  Ambien 10 Mg  Tabs (Zolpidem Tartrate) .[name].. 1 By Mouth Qhs 5)  Aspirin 81 Mg  Tbec (Aspirin) .... Take 1 Tablet By Mouth Once A Day 6)  Symbicort 160-4.5 Mcg/act  Aero (Budesonide-Formoterol Fumarate) .[name].. 1-2 Puffs Q 12 Hrs 7)  Depakote Er 500 Mg  Tb24 (Divalproex Sodium) .... Take 3 Tabs Qhs 8)  Propoxyphene N-Apap 100-650 Mg  Tabs (Propoxyphene N-Apap) .[name].. 1-2 Q 6 Hrs As Needed Pain 9)  Janumet 50-500 Mg Tabs (Sitagliptin-Metformin Hcl) .... Take 1 By Mouth Two Times A Day 10)  Freestyle Lite Test  Strp (Glucose Blood) .... Accu Check Two Times A Day 11)  Freestyle Lite Lancets .... As Directed 12)  L Thyroxine 25 Mg .[name].. 1 By Mouth Once Daily 13)  Fluticasone Propionate 50 Mcg/act Susp (Fluticasone Propionate) .... 2 Sprays Each Nostril Once  Daily 14)  Tramadol Hcl 50 Mg Tabs (Tramadol Hcl) 15)  Meloxicam 7.5 Mg Tabs (Meloxicam) .[name].. 1 Two Times A Day As Needed Caalf  Pain 16)  Furosemide 20 Mg Tabs (Furosemide) .... Take 1 Tab By Mouth Every Day 17)  Neurontin 300 Mg Caps (Gabapentin) .[name].. 1 By Mouth Three Times A Day  Allergies (verified): No Known Drug Allergies  Review of Systems      See HPI  Physical Exam  General:   well-nourished,in no acute distress; alert,appropriate and cooperative throughout examination Pulses:  R and L dorsalis pedis and posterior tibial pulses are full and equal bilaterally Extremities:  minimal LE edema bilaterally, L>R.  no calf tenderness, (-) Homan's.   Impression & Recommendations:  Problem # 1:  EDEMA- LOCALIZED (ICD-782.3) Assessment Improved much less edema, down 7 lbs.  check BMP since pt now taking lasix daily. Her updated medication list for this problem includes:    Furosemide 20 Mg Tabs (Furosemide) .[name]... Take 1 tab by mouth every day  Orders: Venipuncture (16109) TLB-BMP (Basic Metabolic Panel-BMET) (80048-METABOL)  Complete Medication List: 1)  Simvastatin 20 Mg Tabs (Simvastatin) .[name].. 1 by mouth once daily pt due for labs now 2)  Cymbalta 60 Mg Cpep (Duloxetine hcl) .[name].. 1 by mouth once daily 3)  Dilt-xr 240 Mg Cp24 (Diltiazem hcl) .[name].. 1 by mouth qd 4)  Ambien 10 Mg Tabs (Zolpidem tartrate) .[name].. 1 by mouth qhs 5)  Aspirin 81 Mg Tbec (Aspirin) .... Take 1 tablet by mouth once a day 6)  Symbicort 160-4.5 Mcg/act Aero (Budesonide-formoterol fumarate) .[name].. 1-2 puffs q 12 hrs 7)  Depakote Er 500 Mg Tb24 (Divalproex sodium) .... Take 3 tabs qhs 8)  Propoxyphene N-apap 100-650 Mg Tabs (Propoxyphene n-apap) .[name].. 1-2 q 6 hrs as needed pain 9)  Janumet 50-500 Mg Tabs (Sitagliptin-metformin hcl) .... Take 1 by mouth two times a day 10)  Freestyle Lite Test Strp (Glucose blood) .... Accu check two times a day 11)  Freestyle Lite Lancets  .... As directed 12)  L Thyroxine 25 Mg  .Marland KitchenMarland Kitchen. 1 by mouth once daily 13)  Fluticasone Propionate 50 Mcg/act Susp (Fluticasone propionate) .... 2 sprays each nostril once  daily 14)  Tramadol Hcl 50 Mg Tabs (Tramadol hcl) 15)  Meloxicam 7.5 Mg Tabs (Meloxicam) .[name].. 1 two times a day as needed caalf  pain 16)  Furosemide 20 Mg Tabs (Furosemide) .... Take 1 tab by mouth every day 17)  Neurontin 300 Mg Caps (Gabapentin) .[name].. 1 by mouth  three times a day  Patient Instructions: 1)  Please schedule a follow up with Dr Alwyn Ren in the next month 2)  Continue the Furosemide daily 3)  We'll call you with your lab results and let you know if you need to add potassium 4)  Call with any questions or concerns 5)  Happy Easter!

## 2010-02-17 NOTE — Progress Notes (Signed)
Summary: JANUMET REFILL  Phone Note Refill Request Call back at Work Phone [phone] Message from:  Patient on November 19, 2009 12:07 PM  Refills Requested: Medication #1:  JANUMET 50-500 MG TABS take 1 by mouth two times a day [facility]  90 DAY SUPPLY  Initial call taken by: Jerolyn Shin,  November 19, 2009 12:08 PM    New/Updated Medications: JANUMET 50-500 MG TABS (SITAGLIPTIN-METFORMIN HCL) take 1 by mouth two times a day**APPOINTMENT DUE** Prescriptions: JANUMET 50-500 MG TABS (SITAGLIPTIN-METFORMIN HCL) take 1 by mouth two times a day**APPOINTMENT DUE**  #180 x 0   Entered by:   Shonna Chock CMA   Authorized by:   Marga Melnick MD   Signed by:   Shonna Chock CMA on 11/19/2009   Method used:   Faxed to ...       [facility] (mail-order)             , [place]         Ph: [phone]       Fax: [phone]   RxID:   [id number]

## 2010-02-17 NOTE — Progress Notes (Signed)
Summary: difficulty breathing  Phone Note Call from Patient   Caller: Patient Summary of Call: pt left VM that she is having difficulty breathing due to the air outside. Pt states that she was given Symbicort to help with these symptoms. pt would now like to have rx sent to pharmacy.pt uses [address]. left message to call ...........Marland KitchenFelecia Deloach CMA  July 15, 2009 1:22 PM    Follow-up for Phone Call        pt states that she is having difficulty taking a deep breath that is increased when outside.pt denies any SOB, airway restriction, or tighten of chest. pt advise if unable to catch breath or airway become restricted she needs to be seen in ED immediately, pt ok. pt advise rx sent to pharmacy but she needs to f/u with dr hopper if no relief with inhaler, pt ok.......Marland KitchenFelecia Deloach CMA  July 15, 2009 3:48 PM     Prescriptions: SYMBICORT 160-4.5 MCG/ACT  AERO (BUDESONIDE-FORMOTEROL FUMARATE) 1-2 puffs q 12 hrs as needed  #1 x 0   Entered by:   Jeremy Johann CMA   Authorized by:   Marga Melnick MD   Signed by:   Jeremy Johann CMA on 07/15/2009   Method used:   F[facility]ed to ...       [facility]  [address] [phone] (retail)       [address]       [place], [place]  [id number]       Ph: [phone] or [phone]       Fax: [phone]   RxID:   [id number]

## 2010-02-17 NOTE — Letter (Signed)
Summary: [facility]  [facility]   Imported By: Lanelle Bal 05/20/2009 11:40:49  _____________________________________________________________________  External Attachment:    Type:   Image     Comment:   External Document

## 2010-02-17 NOTE — Progress Notes (Signed)
Summary: Refill Requests  Phone Note Refill Request Call back at [phone] Message from:  Pharmacy on June 20, 2009 8:58 AM  Refills Requested: Medication #1:  SIMVASTATIN 20 MG TABS 1 by mouth once daily pt due for labs now   Dosage confirmed as above?Dosage Confirmed   Supply Requested: 3 months  Medication #2:  FLUTICASONE PROPIONATE 50 MCG/ACT SUSP 2 sprays each nostril once  daily   Dosage confirmed as above?Dosage Confirmed   Supply Requested: 3 months   Notes: 3 refills [facility]  Next Appointment Scheduled: none Initial call taken by: Harold Barban,  June 20, 2009 8:58 AM    Prescriptions: FLUTICASONE PROPIONATE 50 MCG/ACT SUSP (FLUTICASONE PROPIONATE) 2 sprays each nostril once  daily  #3 x 3   Entered by:   Shonna Chock   Authorized by:   Marga Melnick MD   Signed by:   Shonna Chock on 06/20/2009   Method used:   Faxed to ...       [facility] (mail-order)             ,          Ph: [phone]       Fax: [phone]   RxID:   [id number] SIMVASTATIN 20 MG TABS (SIMVASTATIN) 1 by mouth once daily pt due for labs now  #90 x 3   Entered by:   Shonna Chock   Authorized by:   Marga Melnick MD   Signed by:   Shonna Chock on 06/20/2009   Method used:   Faxed to ...       [facility] (mail-order)             ,          Ph: [phone]       Fax: [phone]   RxID:   [id number]

## 2010-02-17 NOTE — Miscellaneous (Signed)
Summary: Orders Update  Clinical Lists Changes  Orders: Added new Test order of Venous Duplex Lower Extremity (Venous Duplex Lower) - Signed 

## 2010-02-17 NOTE — Progress Notes (Signed)
Summary: Schedule [place]   ---- Converted from flag ---- ---- 07/30/2009 2:27 PM, Meryl Dare MD [facility] wrote: Discussed with Dr. Zachery Conch at [facility] and abd U/S could be tried first to monitor the size. If this study is not adequate for any reason we could move to MR. MS  ---- 07/22/2009 10:48 AM, Darcey Nora RN, CGRN wrote: Dr Russella Dar please see Ashley's note ,  we are following up from MRI from last year.  Please advise  ---- 07/22/2009 8:19 AM, Dwan Bolt wrote: [facility] is requesting a "peer to peer" review for her MRI abd. I sent them the findings and impression from last year's MRI so I'm not quite sure what the problem is with approving this one.   If Dr Russella Dar would like to call (I know he is hospital doctor this week) [phone] opt 4 will get him to their MD case no. [id number]  Other option, we can let this request expire and attempt a CT approval if Dr Russella Dar feels this would be adequate. ------------------------------  Phone Note Outgoing Call Call back at [facility] Phone [phone]   Call placed by: Darcey Nora RN, CGRN,  July 31, 2009 11:02 AM Call placed to: Patient Summary of Call: Patient  is scheduled at [facility] Imaging for [place] of liver at [facility].  Patient  aware to be NPO after midnight Initial call taken by: Darcey Nora RN, CGRN,  July 31, 2009 11:09 AM

## 2010-02-17 NOTE — Progress Notes (Signed)
Summary: refill  Phone Note Refill Request Message from:  Fax from Pharmacy on May 06, 2009 8:44 AM  Refills Requested: Medication #1:  JANUMET 50-500 MG TABS take 1 by mouth two times a day [facility] fax [phone]   Method Requested: Fax to Local Pharmacy Next Appointment Scheduled: 05/08/2009 Initial call taken by: Barb Merino,  May 06, 2009 8:45 AM    Prescriptions: JANUMET 50-500 MG TABS (SITAGLIPTIN-METFORMIN HCL) take 1 by mouth two times a day  #180 x 0   Entered by:   Shonna Chock   Authorized by:   Marga Melnick MD   Signed by:   Shonna Chock on 05/06/2009   Method used:   Faxed to ...       [facility] (mail-order)             ,          Ph: [phone]       Fax: [phone]   RxID:   [id number]

## 2010-02-17 NOTE — Assessment & Plan Note (Signed)
Summary: edema, feet and ankles/alr   Vital Signs:  Patient profile:   60 year old female Weight:      299 pounds Pulse rate:   56 / minute BP sitting:   118 / 68  (left arm)  Vitals Entered By: Doristine Devoid (April 25, 2009 2:05 PM) CC: swollen feet and ankles, retaining fluid has gained 14lbs    History of Present Illness: 60 yo woman here today for LE edema.  pt reports swelling worsens throughout the day and by 6pm she has difficulty removing shoes.  is up 14 lbs since last visit.  sxs started 3-4 days ago.  no recent travel or immobility.  occurs in both legs, L>R (L knee replacement in Oct).  has cardiologist at [facility].  had recent US of legs which was (-) for DVT.  no current leg pain, CP, SOB.  reports she has yearly ECHOs- last done in Aug.  has never been told she has heart failure.  denies recent change to diet, pt tries to avoid salt.  no edema of hands.  edema started when weather warmed.  facial rash- first appeared 1 week ago.  very itchy.  localized to R jawline.  applying hydrocortisone.  sxs improving.  Current Medications (verified): 1)  Simvastatin 20 Mg Tabs (Simvastatin) .[name].. 1 By Mouth Once Daily Pt Due For Labs Now 2)  Cymbalta 60 Mg  Cpep (Duloxetine Hcl) .[name].. 1 By Mouth Once Daily 3)  Dilt-Xr 240 Mg  Cp24 (Diltiazem Hcl) .[name].. 1 By Mouth Qd 4)  Ambien 10 Mg  Tabs (Zolpidem Tartrate) .[name].. 1 By Mouth Qhs 5)  Aspirin 81 Mg  Tbec (Aspirin) .... Take 1 Tablet By Mouth Once A Day 6)  Symbicort 160-4.5 Mcg/act  Aero (Budesonide-Formoterol Fumarate) .[name].. 1-2 Puffs Q 12 Hrs 7)  Depakote Er 500 Mg  Tb24 (Divalproex Sodium) .... Take 3 Tabs Qhs 8)  Propoxyphene N-Apap 100-650 Mg  Tabs (Propoxyphene N-Apap) .[name].. 1-2 Q 6 Hrs As Needed Pain 9)  Janumet 50-500 Mg Tabs (Sitagliptin-Metformin Hcl) .... Take 1 By Mouth Two Times A Day 10)  Freestyle Lite Test  Strp (Glucose Blood) .... Accu Check Two Times A Day 11)  Freestyle Lite Lancets .... As Directed 12)  L Thyroxine  25 Mg .[name].. 1 By Mouth Once Daily 13)  Fluticasone Propionate 50 Mcg/act Susp (Fluticasone Propionate) .... 2 Sprays Each Nostril Once  Daily 14)  Tramadol Hcl 50 Mg Tabs (Tramadol Hcl) 15)  Meloxicam 7.5 Mg Tabs (Meloxicam) .[name].. 1 Two Times A Day As Needed Caalf  Pain 16)  Furosemide 20 Mg Tabs (Furosemide) .... 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 Chaney Malling steroid injection to the left SI joint 12-2008 per Dr. Alvester Morin  Review of Systems      See HPI  Physical Exam  General:  well-nourished,in no acute distress; alert,appropriate and cooperative throughout examination Head:  Normocephalic and atraumatic without obvious abnormalities. No apparent alopecia or balding. Neck:  No deformities, masses, or tenderness noted. Lungs:  Normal respiratory effort, chest expands symmetrically. Lungs are clear to auscultation, no crackles or wheezes. Heart:  Normal rate and regular rhythm. S1 and S2 normal without  gallop, murmur, click, rub. S4 with slurring Pulses:  R and L dorsalis pedis and posterior tibial pulses are full and equal bilaterally Extremities:  +2 LE edema to mid shin bilaterally.  no calf tenderness, (-) Homan's. Skin:  erythematous macular/papular rash along R lower jaw, some scabbing present.   Impression & Recommendations:  Problem # 1:  EDEMA- LOCALIZED (ICD-782.3) Assessment New pt's edema possibly multifactorial- warm weather, obesity, bilateral knee replacements.  must r/o heart failure- get BMP, re-refer for ECHO.  no evidence of DVT.  encouraged elevation, salt avoidance, adequate hydration.  start Lasix.  reviewed red flags that should prompt immediate attention.  pt aware and in agreement Orders: TLB-BMP (Basic Metabolic Panel-BMET) (80048-METABOL) TLB-BNP (B-Natriuretic Peptide) (83880-BNPR) Cardiology Referral (Cardiology)  Her updated medication list for this problem includes:    Furosemide 20 Mg Tabs (Furosemide) .[name]... Take 1 tab by mouth every day  Problem # 2:  RASH-NONVESICULAR (ICD-782.1) Assessment: New distribution of rash consistent w/ contact dermatitis.  resolving.  should continue hydrocortisone.  Complete Medication List: 1)  Simvastatin 20 Mg Tabs (Simvastatin) .[name].. 1 by mouth once daily pt due for labs now 2)  Cymbalta 60 Mg Cpep (Duloxetine hcl) .[name].. 1 by mouth once daily 3)  Dilt-xr 240 Mg Cp24 (Diltiazem hcl) .[name].. 1 by mouth qd 4)  Ambien 10 Mg Tabs (Zolpidem tartrate) .[name].. 1 by mouth qhs 5)  Aspirin 81 Mg Tbec (Aspirin) .... Take 1 tablet by mouth once a day 6)  Symbicort 160-4.5 Mcg/act Aero (Budesonide-formoterol fumarate) .[name].. 1-2 puffs q 12 hrs 7)  Depakote Er 500 Mg Tb24 (Divalproex sodium) .... Take 3 tabs qhs 8)  Propoxyphene N-apap 100-650 Mg Tabs (Propoxyphene n-apap) .[name].. 1-2 q 6 hrs as needed pain 9)  Janumet 50-500 Mg Tabs (Sitagliptin-metformin hcl) .... Take 1 by mouth two times a day 10)  Freestyle Lite Test Strp (Glucose blood)  .... Accu check two times a day 11)  Freestyle Lite Lancets  .... As directed 12)  L Thyroxine 25 Mg  .Marland KitchenMarland Kitchen. 1 by mouth once daily 13)  Fluticasone Propionate 50 Mcg/act Susp (Fluticasone propionate) .... 2 sprays each nostril once  daily 14)  Tramadol Hcl 50 Mg Tabs (Tramadol hcl) 15)  Meloxicam 7.5 Mg Tabs (Meloxicam) .[name].. 1 two times a day as needed caalf  pain 16)  Furosemide 20 Mg Tabs (Furosemide) .... Take 1 tab by mouth every day  Patient Instructions: 1)  Please schedule a follow-up appointment in 2 weeks to recheck edema. 2)  Start the Furosemide for the swelling 3)  Elevate your legs 4)  Make sure you are not dehydrated 5)  Avoid salt 6)  Someone will call you with your cardiology appt 7)  We'll notify you of your lab results 8)  If you have pain, shortness of breath, increased swelling, leg pain or other concerns- please go to the ER 9)  Hang in there!  Prescriptions:  FUROSEMIDE 20 MG TABS (FUROSEMIDE) Take 1 tab by mouth every day  #30 x 3   Entered and Authorized by:   Neena Rhymes MD   Signed by:   Neena Rhymes MD on 04/25/2009   Method used:   [facility]        [facility]  [address] [phone][facility] (retail)       [address]       [place], [place]  [id number]       Ph: [phone] or [phone]       Fax: [phone]   RxID:   [id number]

## 2010-02-17 NOTE — Assessment & Plan Note (Signed)
Summary: sinus infection/scm   Vital Signs:  Patient profile:   60 year old female Weight:      306 pounds O2 Sat:      95 % on Room air Temp:     98.7 degrees F oral Pulse rate:   98 / minute BP sitting:   134 / 80  (right arm)  Vitals Entered By: Doristine Devoid CMA (November 06, 2009 11:35 AM)  O2 Flow:  Room air CC: sinus infection having drainage, HA, and pressure   History of Present Illness: 60 yo woman here today for ? sinus infxn.  reports 'really bad cold' that started 3 weeks ago.  now w/ barking cough and 'tons of mucous in my head'.  + facial pain and pressure.  + nasal congestion, PND, itchy ears.  no fever, N/V.    Allergies: No Known Drug Allergies   Complete Medication List: 1)  Simvastatin 20 Mg Tabs (Simvastatin) .[name].. 1 by mouth once daily pt due for labs now 2)  Cymbalta 60 Mg Cpep (Duloxetine hcl) .[name].. 1 by mouth once daily 3)  Dilt-xr 240 Mg Cp24 (Diltiazem hcl) .[name].. 1 by mouth qd 4)  Ambien 10 Mg Tabs (Zolpidem tartrate) .[name].. 1 by mouth qhs 5)  Aspirin 81 Mg Tbec (Aspirin) .... Take 1 tablet by mouth once a day 6)  Symbicort 160-4.5 Mcg/act Aero (Budesonide-formoterol fumarate) .[name].. 1-2 puffs q 12 hrs as needed 7)  Depakote Er 500 Mg Tb24 (Divalproex sodium) .... Take 3 tabs qhs 8)  Propoxyphene N-apap 100-650 Mg Tabs (Propoxyphene n-apap) .[name].. 1 by mouth at bedtime 9)  Janumet 50-500 Mg Tabs (Sitagliptin-metformin hcl) .... Take 1 by mouth two times a day 10)  Freestyle Lite Test Strp (Glucose blood) .... Accu check two times a day 11)  Freestyle Lite Lancets  .... As directed 12)  L Thyroxine 25 Mg  .Marland KitchenMarland Kitchen. 1 by mouth once daily 13)  Fluticasone Propionate 50 Mcg/act Susp (Fluticasone propionate) .... 2 sprays each nostril once  daily 14)  Furosemide 20 Mg Tabs (Furosemide) .... Take 1 tab by mouth every day 15)  Neurontin 300 Mg Caps (Gabapentin) .[name].. 1 by mouth three times a day 16)  Vesicare 5 Mg Tabs (Solifenacin succinate) .[name].. 1 by mouth once  daily 17)  Meclizine Hcl 25 Mg Tabs (Meclizine hcl) .[name].. 1 every 6 hrs as needed vertigo 18)  Glimepiride 2 Mg Tabs (Glimepiride) .[name].. 1 once daily 19)  Amoxicillin 500 Mg Tabs (Amoxicillin) .... 2 tabs by mouth two times a day x10 days.  take w/ food. 20)  Tessalon 200 Mg Caps (Benzonatate) .... Take one capsule by mouth three times a day as needed for cough  Patient Instructions: 1)  You have a sinus infection 2)  Take the Amoxicillin as directed- take w/ food to avoid upset stomach 3)  Drink plenty of fluids 4)  Eat a yogurt daily 5)  Use the Tessalon pills as needed for cough 6)  Tylenol or ibuprofen for pain or fever 7)  REST! 8)  Hang in there!!! Prescriptions: TESSALON 200 MG CAPS (BENZONATATE) Take one capsule by mouth three times a day as needed for cough  #60 x 0   Entered and Authorized by:   Neena Rhymes MD   Signed by:   Neena Rhymes MD on 11/06/2009   Method used:   [facility]  [address] [phone]       [address]       [place], [place]  [address]       Ph: [phone] or [phone]       Fax: [phone]   RxID:   [id number] AMOXICILLIN 500 MG TABS (AMOXICILLIN) 2 tabs by mouth two times a day x10 days.  take w/ food.  #40 x 0   Entered and Authorized by:   Neena Rhymes MD   Signed by:   Neena Rhymes MD on 11/06/2009   Method used:   [facility]        [facility] [address] [phone][facility] (retail)       [address]       [place], [place]  [address]       Ph: [phone] or [phone]       Fax: [phone]   RxID:   [id number]   Appended Document: sinus infection/scm     History of Present Illness: see above  Current Medications (verified): 1)  Simvastatin 20 Mg Tabs (Simvastatin) .[name].. 1 By Mouth Once Daily Pt Due For Labs Now 2)  Cymbalta 60 Mg  Cpep (Duloxetine Hcl) .[name].. 1 By Mouth Once Daily 3)  Dilt-Xr 240 Mg  Cp24 (Diltiazem  Hcl) .[name].. 1 By Mouth Qd 4)  Ambien 10 Mg  Tabs (Zolpidem Tartrate) .[name].. 1 By Mouth Qhs 5)  Aspirin 81 Mg  Tbec (Aspirin) .... Take 1 Tablet By Mouth Once A Day 6)  Symbicort 160-4.5 Mcg/act  Aero (Budesonide-Formoterol Fumarate) .[name].. 1-2 Puffs Q 12 Hrs As Needed 7)  Depakote Er 500 Mg  Tb24 (Divalproex Sodium) .... Take 3 Tabs Qhs 8)  Propoxyphene N-Apap 100-650 Mg  Tabs (Propoxyphene N-Apap) .[name].. 1 By Mouth At Bedtime 9)  Janumet 50-500 Mg Tabs (Sitagliptin-Metformin Hcl) .... Take 1 By Mouth Two Times A Day 10)  Freestyle Lite Test  Strp (Glucose Blood) .... Accu Check Two Times A Day 11)  Freestyle Lite Lancets .... As Directed 12)  L Thyroxine 25 Mg .[name].. 1 By Mouth Once Daily 13)  Fluticasone Propionate 50 Mcg/act Susp (Fluticasone Propionate) .... 2 Sprays Each Nostril Once  Daily 14)  Furosemide 20 Mg Tabs (Furosemide) .... Take 1 Tab By Mouth Every Day 15)  Neurontin 300 Mg Caps (Gabapentin) .[name].. 1 By Mouth Three Times A Day 16)  Vesicare 5 Mg Tabs (Solifenacin Succinate) .[name].. 1 By Mouth Once Daily 17)  Meclizine Hcl 25 Mg Tabs (Meclizine Hcl) .[name].. 1 Every 6 Hrs As Needed Vertigo 18)  Glimepiride 2 Mg Tabs (Glimepiride) .[name].. 1 Once Daily 19)  Amoxicillin 500 Mg Tabs (Amoxicillin) .... 2 Tabs By Mouth Two Times A Day X10 Days.  Take W/ Food. 20)  Tessalon 200 Mg Caps (Benzonatate) .... Take One Capsule By Mouth Three Times A Day As Needed For Cough  Allergies (verified): No Known Drug Allergies  Review of Systems      See HPI  Physical Exam  General:  in no acute distress; alert,appropriate and cooperative throughout examination Head:  Normocephalic and atraumatic without obvious abnormalities. No apparent alopecia or balding.  + TTP over frontal and maxillary sinuses Eyes:  no injxn or inflammation Ears:  External ear exam shows no significant lesions or deformities.  Otoscopic examination reveals clear canals, tympanic membranes are intact bilaterally without bulging,  retraction, inflammation or discharge. Hearing is grossly normal bilaterally. Tuning fork lateralizes to R ; air conduction > BC bilaterally Nose:  +  congestion Mouth:  + PND Neck:  No deformities, masses, or tenderness noted. Lungs:  Normal respiratory effort, chest expands symmetrically. Lungs are clear to auscultation, no crackles or wheezes.  + barking cough Heart:  Normal rate and regular rhythm. S1 and S2 normal without gallop, murmur, click, rub or other extra sounds.   Impression & Recommendations:  Problem # 1:  SINUSITIS - ACUTE-NOS (ICD-461.9) Assessment New pt's sxs and PE consistent w/ infxn.  start amox.  reviewed supportive care and red flags that should prompt return.  Pt expresses understanding and is in agreement w/ this plan. Her updated medication list for this problem includes:    Fluticasone Propionate 50 Mcg/act Susp (Fluticasone propionate) .[name]... 2 sprays each nostril once  daily    Amoxicillin 500 Mg Tabs (Amoxicillin) .[name]... 2 tabs by mouth two times a day x10 days.  take w/ food.    Tessalon 200 Mg Caps (Benzonatate) .[name]... Take one capsule by mouth three times a day as needed for cough  Complete Medication List: 1)  Simvastatin 20 Mg Tabs (Simvastatin) .[name].. 1 by mouth once daily pt due for labs now 2)  Cymbalta 60 Mg Cpep (Duloxetine hcl) .[name].. 1 by mouth once daily 3)  Dilt-xr 240 Mg Cp24 (Diltiazem hcl) .[name].. 1 by mouth qd 4)  Ambien 10 Mg Tabs (Zolpidem tartrate) .[name].. 1 by mouth qhs 5)  Aspirin 81 Mg Tbec (Aspirin) .... Take 1 tablet by mouth once a day 6)  Symbicort 160-4.5 Mcg/act Aero (Budesonide-formoterol fumarate) .[name].. 1-2 puffs q 12 hrs as needed 7)  Depakote Er 500 Mg Tb24 (Divalproex sodium) .... Take 3 tabs qhs 8)  Propoxyphene N-apap 100-650 Mg Tabs (Propoxyphene n-apap) .[name].. 1 by mouth at bedtime 9)  Janumet 50-500 Mg Tabs (Sitagliptin-metformin hcl) .... Take 1 by mouth two times a day 10)  Freestyle Lite Test Strp (Glucose blood) .... Accu check  two times a day 11)  Freestyle Lite Lancets  .... As directed 12)  L Thyroxine 25 Mg  .Marland KitchenMarland Kitchen. 1 by mouth once daily 13)  Fluticasone Propionate 50 Mcg/act Susp (Fluticasone propionate) .... 2 sprays each nostril once  daily 14)  Furosemide 20 Mg Tabs (Furosemide) .... Take 1 tab by mouth every day 15)  Neurontin 300 Mg Caps (Gabapentin) .[name].. 1 by mouth three times a day 16)  Vesicare 5 Mg Tabs (Solifenacin succinate) .[name].. 1 by mouth once daily 17)  Meclizine Hcl 25 Mg Tabs (Meclizine hcl) .[name].. 1 every 6 hrs as needed vertigo 18)  Glimepiride 2 Mg Tabs (Glimepiride) .[name].. 1 once daily 19)  Amoxicillin 500 Mg Tabs (Amoxicillin) .... 2 tabs by mouth two times a day x10 days.  take w/ food. 20)  Tessalon 200 Mg Caps (Benzonatate) .... Take one capsule by mouth three times a day as needed for cough   Orders Added: 1)  Est. Patient Level III [62952]

## 2010-02-17 NOTE — Progress Notes (Signed)
Summary: Leg Cramps-Triage Message  Phone Note Call from Patient Call back at Home Phone [phone] Call back at Work Phone [phone]   Caller: Patient Summary of Call: Patient with leg cramps x 1 week (only at night) [facility]. Patient was offered appointment today and was unable to. Patient is wondering if she has low potassium or low Vit-D that could be the cause of leg cramps. Patient would like to know if Dr.Francena Zender would order some labs that she can go to [facility] today and get./[facility]  February 21, 2009 1:16 PM     .Shonna Chock  February 21, 2009 1:14 PM   Follow-up for Phone Call        CPK, vitamin D , K+, Ca++,Mg++ (729.1) Follow-up by: Marga Melnick MD,  February 21, 2009 1:31 PM  Additional Follow-up for Phone Call Additional follow up Details #1::        pt aware lab appt schedule..............Marland KitchenFelecia Deloach CMA  February 21, 2009 2:43 PM

## 2010-02-17 NOTE — Assessment & Plan Note (Signed)
Summary: Leg cramps-Only time patient could come/scm   Vital Signs:  Patient profile:   60 year old female Weight:      281 pounds Temp:     98.1 degrees F oral BP sitting:   122 / 84  (left arm) Cuff size:   large  Vitals Entered By: Alfred Levins, CMA (February 22, 2009 12:56 PM) CC: leg cramps x1 wk   History of Present Illness: Here for one week of nocturnal cramps in the left calf. She has a long hx of occasional nocturnal cramps in both feet, but not typically in the legs. She has some mild soreness in the left calf during the day but no real pain. The cramps only occur at night. No pain or numbness in the feet. No chest pain or SOB. No hx of varicose leg veins. She had normal labs this past September, then she had normal labs yesterday for potassium, CK, calcium., and Mg. Her vitamin D level was low. I don't see where she has had a TSH since March of last year.    Allergies: No Known Drug Allergies  Past History:  Past Medical History: Reviewed history from 10/04/2008 and no changes required. 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 Chaney Malling steroid injection  to the left SI joint 12-2008 per Dr. Alvester Morin  Review of Systems  The patient denies anorexia, fever, weight loss, weight gain, vision loss, decreased hearing, hoarseness, chest pain, syncope, dyspnea on exertion, peripheral edema, prolonged cough, headaches, hemoptysis, abdominal pain, melena, hematochezia, severe indigestion/heartburn, hematuria, incontinence, genital sores, muscle weakness, suspicious skin lesions, transient blindness, difficulty walking, depression, unusual weight change, abnormal bleeding, enlarged lymph nodes, angioedema, breast masses, and testicular masses.    Physical Exam  General:  Well-developed,well-nourished,in no acute distress; alert,appropriate and cooperative throughout examination Lungs:  Normal respiratory effort, chest expands symmetrically. Lungs are clear to auscultation, no crackles or wheezes. Heart:  Normal rate and regular rhythm. S1 and S2 normal without gallop, murmur, click, rub or other extra sounds. Pulses:  L posterior tibial normal and L dorsalis pedis normal.   Extremities:  mildly tender throughout the left calf. No cords are felt, no varicosities. No popliteal masses or tenderness. Denna Haggard is mildly positive  on the left and negative on the right.    Impression & Recommendations:  Problem # 1:  LEG CRAMPS, NOCTURNAL (ICD-729.82)  Complete Medication List: 1)  Simvastatin 20 Mg Tabs (Simvastatin) .[name].. 1 by mouth once daily pt due for labs now 2)  Cymbalta 60 Mg Cpep (Duloxetine hcl) .[name].. 1 by mouth once daily 3)  Dilt-xr 240 Mg Cp24 (Diltiazem hcl) .[name].. 1 by mouth qd 4)  Ambien 10 Mg Tabs (Zolpidem tartrate) .[name].. 1 by mouth qhs 5)  Aspirin 81 Mg Tbec (Aspirin) .... Take 1 tablet by mouth once a day 6)  Symbicort 160-4.5 Mcg/act Aero (Budesonide-formoterol fumarate) .[name].. 1-2 puffs q 12 hrs 7)  Depakote Er 500 Mg Tb24 (Divalproex sodium) .... Take 3 tabs qhs 8)  Propoxyphene N-apap 100-650 Mg Tabs (Propoxyphene n-apap) .[name].. 1-2 q 6 hrs as  needed pain 9)  Janumet 50-500 Mg Tabs (Sitagliptin-metformin hcl) .... Take 1 by mouth two times a day 10)  Freestyle Lite Test Strp (Glucose blood) .... Accu check two times a day 11)  Freestyle Lite Lancets  .... As directed 12)  L Thyroxine 25 Mg  .Marland KitchenMarland Kitchen. 1 by mouth once daily 13)  Fluticasone Propionate 50 Mcg/act Susp (Fluticasone propionate) .... 2 sprays each nostril once  daily 14)  Promethazine Hcl 25 Mg Supp (Promethazine hcl) .... Insert 1 q 6-8 hrs as needed  Patient Instructions: 1)  The pattern of these cramps is consistent with benign nocturnal leg cramps, but the fact that they are unilateral is a bit concerning. The possibility of a DVT exists, but I think it is not likely. She needs to have a TSH drawn next week. I suggested she have a glass of tonic water every night before bed to get a dose of quinine. She knows to go to the ER if she develops any chest pain or SOB. If she is not better by Monday, I advised her to see Dr. Alwyn Ren that day. In that case it may be prudent to get a venous doppler study on the leg. She will rest and stay off her feet this weekend. She will increase her aspirin dose to 325mg  two times a day until next week. She will speak to Dr. Alwyn Ren about treating her vitamin D deficiency as well.

## 2010-02-17 NOTE — Progress Notes (Signed)
Summary: Schedule MRI liver   Phone Note Outgoing Call Call back at [facility] Phone [phone]   Call placed by: Darcey Nora RN, CGRN,  July 18, 2009 11:04 AM Call placed to: Patient Summary of Call: Called patient to arrange for repeat hepatic MRI from 08/01/2008.  Patient  is scheduled for [address] 07/28/09 12:15.  She is asked to be 4 hours NPO.  She will come for labs one day next week. Initial call taken by: Darcey Nora RN, CGRN,  July 18, 2009 11:11 AM

## 2010-02-17 NOTE — Letter (Signed)
Summary: [facility]   Imported By: Lanelle Bal 12/02/2009 10:25:57  _____________________________________________________________________  External Attachment:    Type:   Image     Comment:   External Document

## 2010-02-17 NOTE — Progress Notes (Signed)
Summary: REFILL REQUEST  Phone Note Refill Request Message from:  Pharmacy on August 25, 2009 3:29 PM  Refills Requested: Medication #1:  JANUMET 50-500 MG TABS take 1 by mouth two times a day   Dosage confirmed as above?Dosage Confirmed   Supply Requested: 1 month   Notes: PT IS COMPLETELY OUT OF MEDICINE AND BLOOD SUGAR IS HIGH [facility] [place] [facility] [address]   Initial call taken by: Lavell Islam,  August 25, 2009 3:30 PM    Prescriptions: JANUMET 50-500 MG TABS (SITAGLIPTIN-METFORMIN HCL) take 1 by mouth two times a day  #180 Tablet x 0   Entered by:   Shonna Chock CMA   Authorized by:   Marga Melnick MD   Signed by:   Shonna Chock CMA on 08/25/2009   Method used:   [facility]        [facility] [phone]* (retail)       [address]       [place], [place]  [address]       Ph: [phone] or [phone]       Fax: [phone]   RxID:   [id number]

## 2010-02-17 NOTE — Assessment & Plan Note (Signed)
Summary: BLOOD SUGAR HIGH/KN   Vital Signs:  Patient profile:   60 year old female Weight:      308 pounds O2 Sat:      96 % Temp:     98.6 degrees F oral Pulse rate:   74 / minute Resp:     17 per minute BP supine:   126 / 78  (right arm) BP sitting:   126 / 70  (right arm) BP standing:   122 / 76  (right arm) Cuff size:   large  Vitals Entered By: Shonna Chock CMA (September 16, 2009 2:26 PM) CC: 1.) BS Concerns  2.) Dizziness and overall not feeling well x 2 weeks  3.) Patient with constant Headaches and Diarrhea x a long time, Type 2 diabetes mellitus follow-up, Syncope, Lipid Management Comments Pulse supine: 74 Pulse standing: 86   Primary Care Provider:  Marga Melnick, MD  CC:  1.) BS Concerns  2.) Dizziness and overall not feeling well x 2 weeks  3.) Patient with constant Headaches and Diarrhea x a long time, Type 2 diabetes mellitus follow-up, Syncope, and Lipid Management.  History of Present Illness:      This is a 60 year old woman who presents for Type 2 diabetes mellitus follow-up, concerned about decreased control with new symptoms.  The patient reports polyuria, polydipsia, and blurred vision, but denies self managed hypoglycemia. She has had a  weight gain of 20#. She denies  numbness of extremities.  Other symptoms include  increased lightheadedness in addition to  occasional orthostatic symptoms.  The patient denies the following symptoms: neuropathic pain, chest pain, vomiting, poor wound healing, intermittent claudication, vision loss, and foot ulcer.  Since the last visit the patient reports good dietary compliance, compliance with medications,  but  not exercising regularly.  The patient has been measuring capillary blood glucose before breakfast ( deterioration recently over 3 weeks ; 175-274)and before dinner ( average 200).  Steroid injection L elbow 2 weeks ago. Since the last visit, the patient reports having had eye care by an ophthalmologist ( no retinopathy  06/2009) and no foot care.      The patient also presents with  lightheadedness  The patient denies loss of consciousness, premonitory symptoms, near loss of consciousness, shortness of breath, and incontinence.  Associated symptoms include headache frontally , feeling warm, pallor, and diaphoresis.  The patient denies the following symptoms: abdominal discomfort, nausea, vomiting, focal weakness, and perioral numbness.  The patient reports the following precipitating factors: none noted.  The lightheadedness occurred in the setting of standing position or with rapid side to side head position change. No BPV changesin bed.  The room actually spins  with these episodes. Loose- watery  stool has been a chronic issue X 1-2 months. PMH of same post cholecystectomy; it resolved with Cholestryramine.  Lipid Management History:      Positive NCEP/ATP III risk factors include female age 60 years old or older, diabetes, and ASHD (either angina/prior MI/prior CABG).  Negative NCEP/ATP III risk factors include non-tobacco-user status.    Current Medications (verified): 1)  Simvastatin 20 Mg Tabs (Simvastatin) .[name].. 1 By Mouth Once Daily Pt Due For Labs Now 2)  Cymbalta 60 Mg  Cpep (Duloxetine Hcl) .[name].. 1 By Mouth Once Daily 3)  Dilt-Xr 240 Mg  Cp24 (Diltiazem Hcl) .[name].. 1 By Mouth Qd 4)  Ambien 10 Mg  Tabs (Zolpidem Tartrate) .[name].. 1 By Mouth Qhs 5)  Aspirin 81 Mg  Tbec (Aspirin) .Marland KitchenMarland KitchenMarland Kitchen  Take 1 Tablet By Mouth Once A Day 6)  Symbicort 160-4.5 Mcg/act  Aero (Budesonide-Formoterol Fumarate) .[name].. 1-2 Puffs Q 12 Hrs As Needed 7)  Depakote Er 500 Mg  Tb24 (Divalproex Sodium) .... Take 3 Tabs Qhs 8)  Propoxyphene N-Apap 100-650 Mg  Tabs (Propoxyphene N-Apap) .[name].. 1 By Mouth At Bedtime 9)  Janumet 50-500 Mg Tabs (Sitagliptin-Metformin Hcl) .... Take 1 By Mouth Two Times A Day 10)  Freestyle Lite Test  Strp (Glucose Blood) .... Accu Check Two Times A Day 11)  Freestyle Lite Lancets .... As Directed 12)  L Thyroxine 25 Mg  .[name].. 1 By Mouth Once Daily 13)  Fluticasone Propionate 50 Mcg/act Susp (Fluticasone Propionate) .... 2 Sprays Each Nostril Once  Daily 14)  Furosemide 20 Mg Tabs (Furosemide) .... Take 1 Tab By Mouth Every Day 15)  Neurontin 300 Mg Caps (Gabapentin) .[name].. 1 By Mouth Three Times A Day 16)  Vesicare 5 Mg Tabs (Solifenacin Succinate) .[name].. 1 By Mouth Once Daily  Allergies (verified): No Known Drug Allergies  Review of Systems General:  Denies chills, fever, and sweats. ENT:  Complains of ringing in ears; denies decreased hearing.  Physical Exam  General:  in no acute distress; alert,appropriate and cooperative throughout examination Eyes:  No corneal or conjunctival inflammation noted. EOMI. Perrla. Field of Vision grossly normal. Ears:  External ear exam shows no significant lesions or deformities.  Otoscopic examination reveals clear canals, tympanic membranes are intact bilaterally without bulging, retraction, inflammation or discharge. Hearing is grossly normal bilaterally. Tuning fork lateralizes to R ; air conduction > BC bilaterally Mouth:  Oral mucosa and oropharynx without lesions or exudates.  Tongue w/o deviation Lungs:  Normal respiratory effort, chest expands symmetrically. Lungs are clear to auscultation, no crackles or wheezes. Heart:  Normal rate and regular rhythm. S1 and S2 normal without gallop, murmur, click, rub or other extra sounds. Abdomen:  Bowel sounds positive,abdomen soft and non-tender without masses, organomegaly or hernias noted. Neurologic:  alert & oriented X3, cranial nerves II-XII intact, strength normal in all extremities, sensation intact to light touch, gait normal, DTRs symmetrical  but decreased L knee, finger-to-nose normal, and Romberg negative.   Skin:  Intact without suspicious lesions or rashes Psych:  memory intact for recent and remote, normally interactive, and good eye contact.     Impression & Recommendations:  Problem # 1:  VERTIGO  (ICD-780.4)  Tuning fork exam abnormal  Orders: Venipuncture (04540)  Her updated medication list for this problem includes:    Meclizine Hcl 25 Mg Tabs (Meclizine hcl) .[name]... 1 every 6 hrs as needed vertigo  Problem # 2:  DIAB W/O MENTION COMP TYPE II/UNS TYPE UNCNTRL (ICD-250.02) Assessment: Unchanged  Her updated medication list for this problem includes:    Aspirin 81 Mg Tbec (Aspirin) .[name]... Take 1 tablet by mouth once a day    Janumet 50-500 Mg Tabs (Sitagliptin-metformin hcl) .[name]... Take 1 by mouth two times a day  Orders: Venipuncture (98119) TLB-Creatinine, Blood (82565-CREA) TLB-A1C / Hgb A1C (Glycohemoglobin) (83036-A1C) TLB-Microalbumin/Creat Ratio, Urine (82043-MALB)  Problem # 3:  DIARRHEA (ICD-787.91)  Orders: Venipuncture (14782) TLB-CBC Platelet - w/Differential (85025-CBCD) TLB-Hepatic/Liver Function Pnl (80076-HEPATIC) TLB-TSH (Thyroid Stimulating Hormone) (84443-TSH) TLB-Creatinine, Blood (82565-CREA) TLB-Potassium (K+) (84132-K) TLB-BUN (Urea Nitrogen) (84520-BUN) T-Stool Giardia / Crypto- EIA (95621) T-Stool for O&P (30865-78469) T-Culture, Stool (87045/87046-70140) T-Culture, C-Diff Toxin A/B (62952-84132)  Complete Medication List: 1)  Simvastatin 20 Mg Tabs (Simvastatin) .[name].. 1 by mouth once daily pt due for labs now 2)  Cymbalta 60 Mg Cpep (Duloxetine hcl) .[name].. 1 by mouth once daily 3)  Dilt-xr 240 Mg Cp24 (Diltiazem hcl) .[name].. 1 by mouth qd 4)  Ambien 10 Mg Tabs (Zolpidem tartrate) .[name].. 1 by mouth qhs 5)  Aspirin 81 Mg Tbec (Aspirin) .... Take 1 tablet by mouth once a day 6)  Symbicort 160-4.5 Mcg/act Aero (Budesonide-formoterol fumarate) .[name].. 1-2 puffs q 12 hrs as needed 7)  Depakote Er 500 Mg Tb24 (Divalproex sodium) .... Take 3 tabs qhs 8)  Propoxyphene N-apap 100-650 Mg Tabs (Propoxyphene n-apap) .[name].. 1 by mouth at bedtime 9)  Janumet 50-500 Mg Tabs (Sitagliptin-metformin hcl) .... Take 1 by mouth two times a day 10)  Freestyle Lite Test Strp  (Glucose blood) .... Accu check two times a day 11)  Freestyle Lite Lancets  .... As directed 12)  L Thyroxine 25 Mg  .Marland KitchenMarland Kitchen. 1 by mouth once daily 13)  Fluticasone Propionate 50 Mcg/act Susp (Fluticasone propionate) .... 2 sprays each nostril once  daily 14)  Furosemide 20 Mg Tabs (Furosemide) .... Take 1 tab by mouth every day 15)  Neurontin 300 Mg Caps (Gabapentin) .[name].. 1 by mouth three times a day 16)  Vesicare 5 Mg Tabs (Solifenacin succinate) .[name].. 1 by mouth once daily 17)  Meclizine Hcl 25 Mg Tabs (Meclizine hcl) .[name].. 1 every 6 hrs as needed vertigo  Lipid Assessment/Plan:      Based on NCEP/ATP III, the patient's risk factor category is "history of coronary disease, peripheral vascular disease, cerebrovascular disease, or aortic aneurysm along with either diabetes, current smoker, or LDL > 130 plus HDL < 40 plus triglycerides > 200".  The patient's lipid goals are as follows: Total cholesterol goal is 200; LDL cholesterol goal is 70; HDL cholesterol goal is 40; Triglyceride goal is 150.     Patient Instructions: 1)  Align once daily until bowels are normal. Prescriptions: MECLIZINE HCL 25 MG TABS (MECLIZINE HCL) 1 every 6 hrs as needed vertigo  #30 x 0   Entered and Authorized by:   Marga Melnick MD   Signed by:   Marga Melnick MD on 09/16/2009   Method used:   Print then Give to Patient   RxID:   [id number]   Appended Document: BLOOD SUGAR HIGH/KN

## 2010-02-17 NOTE — Progress Notes (Signed)
Summary: Refill request  Phone Note Refill Request Call back at Fax: [phone] Message from:  [facility]  Refills Requested: Medication #1:  GLIMEPIRIDE 2 MG TABS 1 once daily  Method Requested: Fax to Mail Away Pharmacy Initial call taken by: Shonna Chock CMA,  November 25, 2009 4:37 PM    Prescriptions: GLIMEPIRIDE 2 MG TABS (GLIMEPIRIDE) 1 once daily  #90 x 0   Entered by:   Shonna Chock CMA   Authorized by:   Marga Melnick MD   Signed by:   Shonna Chock CMA on 11/25/2009   Method used:   Electronically to        [facility]* (retail)             ,          Ph: [phone]       Fax: [phone]   RxID:   [id number]

## 2010-02-17 NOTE — Assessment & Plan Note (Signed)
Summary: leg cramps, vit d is low/kdc   Vital Signs:  Patient profile:   60 year old female Weight:      285.6 pounds Temp:     98.9 degrees F oral Pulse rate:   70 / minute Resp:     17 per minute BP sitting:   140 / 84  (left arm) Cuff size:   large  Vitals Entered By: Shonna Chock (February 24, 2009 4:41 PM) CC: Leg Cramps, discuss labs (copy given) Comments REVIEWED MED LIST, PATIENT AGREED DOSE AND INSTRUCTION CORRECT    Primary Care Provider:  Marga Melnick, MD  CC:  Leg Cramps and discuss labs (copy given).  History of Present Illness: Burning , constant L calf pain X 1 week w/o trigger . PMH of L TKR w/o complications; she was on Lovenox  perioperatively. Vitamin D deficiency documented; PMH of same but not on supplement now.  Allergies (verified): No Known Drug Allergies  Review of Systems General:  Denies chills, fever, and sweats. CV:  Denies chest pain or discomfort, difficulty breathing at night, difficulty breathing while lying down, palpitations, swelling of feet, and swelling of hands. Resp:  Denies chest pain with inspiration, coughing up blood, pleuritic, shortness of breath, and wheezing.  Physical Exam  General:  well-nourished,in no acute distress; alert,appropriate and cooperative throughout examination Lungs:  Normal respiratory effort, chest expands symmetrically. Lungs are clear to auscultation, no crackles or wheezes. Heart:  Normal rate and regular rhythm. S1 and S2 normal without gallop, murmur, click, rub. S4 with slurring Pulses:  R and L dorsalis pedis and posterior tibial pulses are full and equal bilaterally Extremities:  trace left pedal edema and trace right pedal edema. + Homan's suggested L calf . Minor crepitus of knees. No L popliteal tenderness     Impression & Recommendations:  Problem # 1:  CALF PAIN, LEFT (ICD-729.5)  Orders: Radiology Referral (Radiology): Venous Doppler  Problem # 2:  VITAMIN D DEFICIENCY  (ICD-268.9)  Complete Medication List: 1)  Simvastatin 20 Mg Tabs (Simvastatin) .[name].. 1 by mouth once daily pt due for labs now 2)  Cymbalta 60 Mg Cpep (Duloxetine hcl) .[name].. 1 by mouth once daily 3)  Dilt-xr 240 Mg Cp24 (Diltiazem hcl) .[name].. 1 by mouth qd 4)  Ambien 10 Mg Tabs (Zolpidem tartrate) .[name].. 1 by mouth qhs 5)  Aspirin 81 Mg Tbec (Aspirin) .... Take 1 tablet by mouth once a day 6)  Symbicort 160-4.5 Mcg/act Aero (Budesonide-formoterol fumarate) .[name].. 1-2 puffs q 12 hrs 7)  Depakote Er 500 Mg Tb24 (Divalproex sodium) .... Take 3 tabs qhs 8)  Propoxyphene N-apap 100-650 Mg Tabs (Propoxyphene n-apap) .[name].. 1-2 q 6 hrs as needed pain 9)  Janumet 50-500 Mg Tabs (Sitagliptin-metformin hcl) .... Take 1 by mouth two times a day 10)  Freestyle Lite Test Strp (Glucose blood) .... Accu check two times a day 11)  Freestyle Lite Lancets  .... As directed 12)  L Thyroxine 25 Mg  .Marland KitchenMarland Kitchen. 1 by mouth once daily 13)  Fluticasone Propionate 50 Mcg/act Susp (Fluticasone propionate) .... 2 sprays each nostril once  daily 14)  Tramadol Hcl 50 Mg Tabs (Tramadol hcl) 15)  Meloxicam 7.5 Mg Tabs (Meloxicam) .[name].. 1 two times a day as needed caalf  pain  Patient Instructions: 1)  Complete Venous Doppler. To ER for chest pain or SOB. Prescriptions: MELOXICAM 7.5 MG TABS (MELOXICAM) 1 two times a day as needed caalf  pain  #20 x 0   Entered and  Authorized by:   Marga Melnick MD   Signed by:   Marga Melnick MD on 02/24/2009   Method used:   Faxed to ...       [facility] [phone]* (retail)       [address]       [place], [place]  [id number]       Ph: [phone] or [phone]       Fax: [phone]   RxID:   [id number]

## 2010-02-17 NOTE — Assessment & Plan Note (Signed)
Summary: rto 1 month/cbs   Vital Signs:  Patient profile:   60 year old female Weight:      302 pounds Temp:     98.1 degrees F oral Pulse rate:   88 / minute Resp:     16 per minute BP sitting:   120 / 82  (left arm) Cuff size:   large  Vitals Entered By: Shonna Chock (Jun 06, 2009 3:33 PM) CC: 1 month f/u- swelling still present Comments REVIEWED MED LIST, PATIENT AGREED DOSE AND INSTRUCTION CORRECT    Primary Care Provider:  Marga Melnick, MD  CC:  1 month f/u- swelling still present.  History of Present Illness: Edema vary week to week  w/o trigger except ?"time on feet". L> R in past month.She is restricting salt as well as possible. She is not on Amlodipine or  Actos. 2D ECHO 05/02 reviewed : essentially normal.Venous Doppler negative in 02/2009.  Allergies (verified): No Known Drug Allergies  Review of Systems CV:  Denies bluish discoloration of lips or nails, chest pain or discomfort, difficulty breathing at night, difficulty breathing while lying down, and swelling of hands.  Physical Exam  General:  in no acute distress; alert,appropriate and cooperative throughout examination Eyes:  No corneal or conjunctival inflammation noted. EOMI. Perrla.No icterus  Neck:  No deformities, masses, or tenderness noted. Lungs:  Normal respiratory effort, chest expands symmetrically. Lungs are clear to auscultation, no crackles or wheezes. Heart:  normal rate, regular rhythm, no murmur, no rub, no JVD, and no HJR.   S4 Abdomen:  Bowel sounds positive,abdomen soft and non-tender without masses, organomegaly or hernias noted. Pulses:  R and L carotid,radial,dorsalis pedis and posterior tibial pulses are full and equal bilaterally Extremities:  legs below knees full w/o pitting. Equivocal Homan's bilaterally Skin:  Intact without suspicious lesions or rashes. No jaundice Psych:  memory intact for recent and remote, normally interactive, and good eye contact.     Impression &  Recommendations:  Problem # 1:  EDEMA- LOCALIZED (ICD-782.3)  Her updated medication list for this problem includes:    Furosemide 20 Mg Tabs (Furosemide) .[name]... Take 1 tab by mouth every day  Problem # 2:  VITAMIN D DEFICIENCY (ICD-268.9)  Complete Medication List: 1)  Simvastatin 20 Mg Tabs (Simvastatin) .[name].. 1 by mouth once daily pt due for labs now 2)  Cymbalta 60 Mg Cpep (Duloxetine hcl) .[name].. 1 by mouth once daily 3)  Dilt-xr 240 Mg Cp24 (Diltiazem hcl) .[name].. 1 by mouth qd 4)  Ambien 10 Mg Tabs (Zolpidem tartrate) .[name].. 1 by mouth qhs 5)  Aspirin 81 Mg Tbec (Aspirin) .... Take 1 tablet by mouth once a day 6)  Symbicort 160-4.5 Mcg/act Aero (Budesonide-formoterol fumarate) .[name].. 1-2 puffs q 12 hrs as needed 7)  Depakote Er 500 Mg Tb24 (Divalproex sodium) .... Take 3 tabs qhs 8)  Propoxyphene N-apap 100-650 Mg Tabs (Propoxyphene n-apap) .[name].. 1 by mouth at bedtime 9)  Janumet 50-500 Mg Tabs (Sitagliptin-metformin hcl) .... Take 1 by mouth two times a day 10)  Freestyle Lite Test Strp (Glucose blood) .... Accu check two times a day 11)  Freestyle Lite Lancets  .... As directed 12)  L Thyroxine 25 Mg  .Marland KitchenMarland Kitchen. 1 by mouth once daily 13)  Fluticasone Propionate 50 Mcg/act Susp (Fluticasone propionate) .... 2 sprays each nostril once  daily 14)  Furosemide 20 Mg Tabs (Furosemide) .... Take 1 tab by mouth every day 15)  Neurontin 300 Mg Caps (Gabapentin) .[name].. 1 by  mouth three times a day 16)  Vesicare 5 Mg Tabs (Solifenacin succinate) .[name].. 1 by mouth once daily  Patient Instructions: 1)  Wear Supoort Hose when up for prolonged periods.Limit your Sodium (Salt) to less than 4 grams a day (slightly less than 1 teaspoon) to prevent fluid retention, swelling, or worsening or symptoms. Schedule fasting labs:Vitamin D level;Hepatic Panel;Lipid Panel ;TSH .

## 2010-02-17 NOTE — Progress Notes (Signed)
Summary: Doppler Results  Phone Note Outgoing Call Call back at [facility] Phone [phone]   Call placed by: Shonna Chock,  March 03, 2009 5:05 PM Call placed to: Patient Summary of Call: Spoke with patient, patient aware. Copy to be mailed  Great report; please verify she ia aware. Please monitor vitamin D level after 4 months. Vitamin D deficiency is #1 cause of muscle pain in women. Hopp  Chrae Malloy  March 03, 2009 5:08 PM

## 2010-02-17 NOTE — Letter (Signed)
Summary: [facility]  [facility]   Imported By: Lanelle Bal 07/28/2009 09:15:10  _____________________________________________________________________  External Attachment:    Type:   Image     Comment:   External Document

## 2010-02-17 NOTE — Progress Notes (Signed)
Summary: Refill Request  Phone Note Refill Request Message from:  Pharmacy  Refills Requested: Medication #1:  L THYROXINE 25 MG 1 by mouth once daily [facility]   Method Requested: Fax to Mail Away Pharmacy Initial call taken by: Shonna Chock,  June 20, 2009 1:16 PM    Prescriptions: L THYROXINE 25 MG 1 by mouth once daily  #90 x 1   Entered by:   Shonna Chock   Authorized by:   Marga Melnick MD   Signed by:   Shonna Chock on 06/20/2009   Method used:   Faxed to ...       [facility]* (mail-order)             ,          Ph: [phone]       Fax: [phone]   RxID:   [id number]

## 2010-02-17 NOTE — Progress Notes (Signed)
Summary: Refill Request  Phone Note Refill Request Message from:  Fax from Pharmacy  Refills Requested: Medication #1:  SIMVASTATIN 20 MG TABS 1 by mouth once daily pt due for labs now [facility]   Method Requested: Fax to Mail Away Pharmacy Initial call taken by: Shonna Chock,  February 21, 2009 2:49 PM    Prescriptions: SIMVASTATIN 20 MG TABS (SIMVASTATIN) 1 by mouth once daily pt due for labs now  #90 x 0   Entered by:   Shonna Chock   Authorized by:   Marga Melnick MD   Signed by:   Shonna Chock on 02/21/2009   Method used:   Faxed to ...       [facility]* (mail-order)             ,          Ph: [phone]       Fax: [phone]   RxID:   [id number]

## 2010-02-18 ENCOUNTER — Other Ambulatory Visit: Payer: Self-pay | Admitting: Gastroenterology

## 2010-02-18 ENCOUNTER — Other Ambulatory Visit (AMBULATORY_SURGERY_CENTER): Payer: 59 | Admitting: Gastroenterology

## 2010-02-18 DIAGNOSIS — D126 Benign neoplasm of colon, unspecified: Secondary | ICD-10-CM

## 2010-02-18 DIAGNOSIS — Z86010 Personal history of colonic polyps: Secondary | ICD-10-CM

## 2010-02-18 DIAGNOSIS — R197 Diarrhea, unspecified: Secondary | ICD-10-CM

## 2010-02-19 ENCOUNTER — Encounter (INDEPENDENT_AMBULATORY_CARE_PROVIDER_SITE_OTHER): Payer: Self-pay | Admitting: *Deleted

## 2010-02-19 ENCOUNTER — Other Ambulatory Visit: Payer: Self-pay

## 2010-02-19 ENCOUNTER — Other Ambulatory Visit: Payer: 59

## 2010-02-19 ENCOUNTER — Ambulatory Visit: Admit: 2010-02-19 | Payer: Self-pay | Admitting: Internal Medicine

## 2010-02-19 ENCOUNTER — Other Ambulatory Visit: Payer: Self-pay | Admitting: Internal Medicine

## 2010-02-19 DIAGNOSIS — E1165 Type 2 diabetes mellitus with hyperglycemia: Secondary | ICD-10-CM

## 2010-02-19 NOTE — Letter (Signed)
Summary: [facility]  [facility]   Imported By: Lanelle Bal 02/13/2010 14:19:21  _____________________________________________________________________  External Attachment:    Type:   Image     Comment:   External Document

## 2010-02-19 NOTE — Assessment & Plan Note (Signed)
Summary: CHRONIC DIARRHEA, ABD CRAMPS, VOMITING...LSW.   History of Present Illness Visit Type: follow up Primary GI MD: Elie Goody MD [facility] Primary Shahida Schnackenberg: Marga Melnick, MD Requesting Terren Haberle: na Chief Complaint: Nausea, diarrhea, bloating, and abd cramping  History of Present Illness:   Alexis Grant relates a 6 month history of frequent, watery, nonbloody diarrhea associated with lower abdominal cramping bloating, and nausea. She states she has between 3 and 10 bowel movements daily. Most of them occur in the morning. She carries a diagnosis of irritable bowel syndrome, however her symptoms have been inactive for a few years. She denies any recent antibiotic use or change in medications before the onset of her symptoms. She states she did take a course of antibiotics for bronchitis while her diarrhea was active however, this did not change her symptoms. She underwent colonoscopy in September 2009 with small colon polyps found.   GI Review of Systems    Reports abdominal pain, bloating, and  nausea.     Location of  Abdominal pain: lower abdomen.    Denies acid reflux, belching, chest pain, dysphagia with liquids, dysphagia with solids, heartburn, loss of appetite, vomiting, vomiting blood, weight loss, and  weight gain.      Reports diarrhea and  irritable bowel syndrome.     Denies anal fissure, black tarry stools, change in bowel habit, constipation, diverticulosis, fecal incontinence, heme positive stool, hemorrhoids, jaundice, light color stool, liver problems, rectal bleeding, and  rectal pain.   Current Medications (verified): 1)  Simvastatin 20 Mg Tabs (Simvastatin) .[name].. 1 By Mouth Once Daily Pt Due For Labs Now 2)  Cymbalta 60 Mg  Cpep (Duloxetine Hcl) .[name].. 1 By Mouth Once Daily 3)  Dilt-Xr 240 Mg  Cp24 (Diltiazem Hcl) .[name].. 1 By Mouth Qd 4)  Ambien 10 Mg  Tabs (Zolpidem Tartrate) .[name].. 1 By Mouth Qhs 5)  Aspirin 81 Mg  Tbec (Aspirin) .... Take 1 Tablet By Mouth Once A  Day 6)  Symbicort 160-4.5 Mcg/act  Aero (Budesonide-Formoterol Fumarate) .[name].. 1-2 Puffs Q 12 Hrs As Needed 7)  Depakote Er 500 Mg  Tb24 (Divalproex Sodium) .... Take 3 Tabs Qhs 8)  Propoxyphene N-Apap 100-650 Mg  Tabs (Propoxyphene N-Apap) .[name].. 1 By Mouth At Bedtime 9)  Janumet 50-500 Mg Tabs (Sitagliptin-Metformin Hcl) .... Take 1 By Mouth Two Times A Day**appointment Due** 10)  Freestyle Lite Test  Strp (Glucose Blood) .... Accu Check Two Times A Day 11)  Freestyle Lite Lancets .... As Directed 12)  L Thyroxine 25 Mg .[name].. 1 By Mouth Once Daily 13)  Fluticasone Propionate 50 Mcg/act Susp (Fluticasone Propionate) .... 2 Sprays Each Nostril Once  Daily 14)  Neurontin 300 Mg Caps (Gabapentin) .[name].. 1 By Mouth Three Times A Day 15)  Vesicare 5 Mg Tabs (Solifenacin Succinate) .[name].. 1 By Mouth Once Daily 16)  Glimepiride 2 Mg Tabs (Glimepiride) .[name].. 1 Once Daily 17)  Tessalon 200 Mg Caps (Benzonatate) .... Take One Capsule By Mouth Three Times A Day As Needed For Cough 18)  Imodium A-D 2 Mg Tabs (Loperamide Hcl) .... 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 Cholecystectomy 1985 Hysterectomy 1988 Tubal ligation 1986 Right knee replacement 11-2005;                                                                                                                                  Cath 2007, Dr Elsie Lincoln left total knee replacement 11-07-08 per Dr. Chaney Malling steroid injection to the left SI joint 12-2008 per Dr. Alvester Morin  Family History: No FH of Colon Cancer: Family History of Colon Polyps: Brother, Mother Family History of Diabetes: Sister, Paternal Aunt No MI or CVA in FH Family History of Irritable Bowel Syndrome: Sister  Social History: Occupation: Environmental health practitioner Widowed Childern Patient has never smoked.  Alcohol Use - no Daily Caffeine Use-4 diet colas  daily Illicit Drug Use - no Patient does not get regular exercise due to knee.   Review of Systems       The patient complains of allergy/sinus, arthritis/joint pain, back pain, cough, fatigue, headaches-new, muscle pains/cramps, sore throat, and urine leakage.         The pertinent positives and negatives are noted as above and in the HPI. All other ROS were reviewed and were negative.  Vital Signs:  Patient profile:   60 year old female Height:      63.75 inches Weight:      310 pounds BMI:     53.82 BSA:     2.35 Pulse rate:   92 / minute Pulse rhythm:   regular BP sitting:   136 / 82  (left arm) Cuff size:   large  Vitals Entered By: Ok Anis CMA (January 05, 2010 2:42 PM)  Physical Exam  General:  Well developed, well nourished, no acute distress. obese.   Head:  Normocephalic and atraumatic. Eyes:  PERRLA, no icterus. Ears:  Normal auditory acuity. Mouth:  No deformity or lesions, dentition normal. Neck:  Supple; no masses or thyromegaly. Lungs:  Clear throughout to auscultation. Heart:  Regular rate and rhythm; no murmurs, rubs,  or bruits. Abdomen:  Soft, nontender and nondistended. No masses, hepatosplenomegaly or hernias noted. Normal bowel sounds. Msk:  Symmetrical with no gross deformities. Normal posture. Pulses:  Normal pulses  noted. Extremities:  No clubbing, cyanosis, edema or deformities noted. Neurologic:  Alert and  oriented x4;  grossly normal neurologically. Cervical Nodes:  No significant cervical adenopathy. Inguinal Nodes:  No significant inguinal adenopathy. Psych:  Alert and cooperative. Normal mood and affect.  Impression & Recommendations:  Problem # 1:  DIARRHEA (ICD-787.91) Chronic diarrhea, associated with crampy lower abdominal pain, bloating, and nausea. History of irritable bowel syndrome. Rule out diabetic diarrhea, celiac disease, chronic intestinal infections, and inflammatory bowel disease. Studies as outlined below. Trial of glycopyrrolate 2 mg twice daily. Orders: TLB-IgA (Immunoglobulin A) (  82784-IGA) T-Sprue Panel (Celiac Disease Aby Eval) (83516x3/[id number]) T-Culture, Stool ([id number]) T-Culture, C-Diff Toxin A/B [id number]) T-Stool Fats [place] Stain [phone]) T-Stool Giardia / Crypto- EIA (29562) T-Stool for O&P (13086-57846) T-Fecal WBC (96295-28413)  Problem # 2:  COLONIC POLYPS, ADENOMATOUS, HX OF (ICD-V12.72) Personal history of adenomatous colon polyps. Surveillance colonoscopy recommended September 2013.  Problem # 3:  FATTY LIVER DISEASE (ICD-571.8)  Problem # 4:  SLEEP APNEA, OBSTRUCTIVE (ICD-327.23)  Problem # 5:  OBESITY (ICD-278.00)  Patient Instructions: 1)  Please go directly to the basement to have your labs drawn.  2)  Pick up your prescriptions from your pharmacy.  3)  Please schedule a follow-up appointment in 6  weeks.  4)  Copy sent to : Marga Melnick, MD 5)  The medication list was reviewed and reconciled.  All changed / newly prescribed medications were explained.  A complete medication list was provided to the patient / caregiver.  Prescriptions: GLYCOPYRROLATE 2 MG TABS (GLYCOPYRROLATE) one tablet by mouth two times a day  #60 x 5   Entered by:   Christie Nottingham CMA (AAMA)   Authorized by:   Meryl Dare MD [facility]   Signed by:    Christie Nottingham CMA (AAMA) on 01/05/2010   Method used:   [facility]  [address] [phone]* (retail)       [address]       [place], [place]  [id number]       Ph: [phone] or [phone]       Fax: [phone]   RxID:   [id number]

## 2010-02-19 NOTE — Letter (Signed)
Summary: [facility]: Immunoassay Fecal Occult Blood (iFOB) Order [facility]  [address] [place], [place] [address]   Phone: [phone]  Fax: [phone]      [facility]: Immunoassay Fecal Occult Blood (iFOB) Order Form   January 05, 2010 MRN: [id number]   Alexis Grant 09-16-1950   Physicican Name: Dr. Russella Dar  Diagnosis Code: 787.91,789.09      Christie Nottingham CMA (AAMA)

## 2010-02-23 ENCOUNTER — Encounter: Payer: Self-pay | Admitting: Gastroenterology

## 2010-02-23 LAB — GLUCOSE, CAPILLARY: Glucose-Capillary: 97 mg/dL (ref 70–99)

## 2010-02-25 NOTE — Procedures (Addendum)
Summary: Colonoscopy  Patient: Alexis Grant Note: All result statuses are Final unless otherwise noted.  Tests: (1) Colonoscopy (COL)   COL Colonoscopy           DONE     [facility]     [address]     [place], [place]  [address]           COLONOSCOPY PROCEDURE REPORT           PATIENT:  Alexis, Grant  MR#:  [id number]     BIRTHDATE:  1950/05/05, 59 yrs. old  GENDER:  female     ENDOSCOPIST:  Judie Petit T. Russella Dar, MD, [facility]           PROCEDURE DATE:  02/18/2010     PROCEDURE:  Colonoscopy with biopsy and snare polypectomy     ASA CLASS:  Class III     INDICATIONS:  1) unexplained diarrhea  2) history of pre-cancerous     (adenomatous) colon polyps: 09/2006.     MEDICATIONS:   Fentanyl 125 mcg IV, Versed 12 mg IV, naloxone     (Narcan) 0.4 mg IV     DESCRIPTION OF PROCEDURE:   After the risks benefits and     alternatives of the procedure were thoroughly explained, informed     consent was obtained.  Digital rectal exam was performed and     revealed no abnormalities.   The LB [id number] endoscope     was introduced through the anus and advanced to the cecum, which     was identified by both the appendix and ileocecal valve, limited     by a tortuous colon.  The quality of the prep was excellent, using     MoviPrep.  The instrument was then slowly withdrawn as the colon     was fully examined.     <<PROCEDUREIMAGES>>           FINDINGS:  A sessile polyp was found in the ascending colon. It     was 5 mm in size. Polyp was snared without cautery. Retrieval was     successful.  A sessile polyp was found at the hepatic flexure. It     was 3 mm in size. The polyp was removed using cold biopsy forceps.     Three polyps were found in the transverse colon. They were 5 mm in     size. Polyps were snared without cautery. Retrieval was     successful. A sessile polyp was found in the descending colon. It     was 5 mm in size. Polyp was snared without cautery. Retrieval  was     successful. Otherwise normal colonoscopy without other polyps,     masses, vascular ectasias, or inflammatory changes. Random     biopsies were obtained and sent to pathology. Retroflexed views in     the rectum revealed no abnormalities. The time to cecum =  5.75     . The scope was then withdrawn (time =  16.75  min) from     the patient and the procedure completed.           COMPLICATIONS:  A complication of hypoxemia occured on 02/18/2010     at 1030, corrected post Narcan, pt repositioning and increased O2.           ENDOSCOPIC IMPRESSION:     1) 5 mm sessile polyp in the ascending colon     2) 3 mm sessile polyp  at the hepatic flexure     3) 5 mm Three polyps in the transverse colon     4) 5 mm sessile polyp in the descending colon           RECOMMENDATIONS:     1) Await pathology results     2) Repeat Colonoscopy in 3 years pending pathology review.           Venita Lick. Russella Dar, MD, [facility]           n.     eSIGNED:   Venita Lick. Stark at 02/18/2010 10:56 AM           Nino Parsley, [id number]  Note: An exclamation mark (!) indicates a result that was not dispersed into the flowsheet. Document Creation Date: 02/18/2010 10:56 AM _______________________________________________________________________  (1) Order result status: Final Collection or observation date-time: 02/18/2010 10:47 Requested date-time:  Receipt date-time:  Reported date-time:  Referring Physician:   Ordering Physician: Claudette Head [phone]) Specimen Source:  Source: Launa Grill Order Number: [id number] Lab site:   Appended Document: Colonoscopy     Procedures Next Due Date:    Colonoscopy: 02/2013

## 2010-02-25 NOTE — Letter (Signed)
Summary: Diabetic Instructions  [facility]  [address] [place], [place] [address]   Phone: [phone]  Fax: [phone]    Alexis Grant 1950/09/24 MRN: [id number]   _ x_   ORAL DIABETIC MEDICATION INSTRUCTIONS         Janumet, Glimeperide  The day before your procedure:   Take your diabetic pill as you do normally  The day of your procedure:   Do not take your diabetic pill    We will check your blood sugar levels during the admission process and again in Recovery before discharging you home  ________________________________________________________________________

## 2010-02-25 NOTE — Assessment & Plan Note (Signed)
Summary: Billie Lade FU Marnee Guarneri   History of Present Illness Visit Type: Follow-up Visit Primary GI MD: Elie Goody MD [facility] Primary Provider: Marga Melnick, MD Requesting Provider: na Chief Complaint: Six week f/u for diarrhea, abd cramping, nausea, and bloating. Pt states abd cramping and diarrhea are worse and medicine is not helping  History of Present Illness:   Mrs. Alden Server notes worsening problems with urgent watery, nonbloody diarrhea occurring report times each day. Episodes of diarrhea are preceded by lower abdominal cramping, and she has had a few episodes of incontinence and urgency. Stool studies, and blood work recently performed were unremarkable.    GI Review of Systems    Reports abdominal pain and  nausea.     Location of  Abdominal pain: lower abdomen.    Denies acid reflux, belching, bloating, chest pain, dysphagia with liquids, dysphagia with solids, heartburn, loss of appetite, vomiting, vomiting blood, weight loss, and  weight gain.      Reports diarrhea.     Denies anal fissure, black tarry stools, change in bowel habit, constipation, diverticulosis, fecal incontinence, heme positive stool, hemorrhoids, irritable bowel syndrome, jaundice, light color stool, liver problems, rectal bleeding, and  rectal pain.   Current Medications (verified): 1)  Simvastatin 20 Mg Tabs (Simvastatin) .[name].. 1 By Mouth Once Daily Pt Due For Labs Now 2)  Cymbalta 60 Mg  Cpep (Duloxetine Hcl) .[name].. 1 By Mouth Once Daily 3)  Dilt-Xr 240 Mg  Cp24 (Diltiazem Hcl) .[name].. 1 By Mouth Qd 4)  Ambien 10 Mg  Tabs (Zolpidem Tartrate) .[name].. 1 By Mouth Qhs 5)  Aspirin 81 Mg  Tbec (Aspirin) .... Take 1 Tablet By Mouth Once A Day 6)  Symbicort 160-4.5 Mcg/act  Aero (Budesonide-Formoterol Fumarate) .[name].. 1-2 Puffs Q 12 Hrs As Needed 7)  Depakote Er 500 Mg  Tb24 (Divalproex Sodium) .... Take 3 Tabs Qhs 8)  Propoxyphene N-Apap 100-650 Mg  Tabs (Propoxyphene N-Apap) .[name].. 1 By Mouth At Bedtime 9)  Janumet 50-500 Mg  Tabs (Sitagliptin-Metformin Hcl) .... Take 1 By Mouth Two Times A Day**appointment Due** 10)  Freestyle Lite Test  Strp (Glucose Blood) .... Accu Check Two Times A Day 11)  Freestyle Lite Lancets .... As Directed 12)  Fluticasone Propionate 50 Mcg/act Susp (Fluticasone Propionate) .... 2 Sprays Each Nostril Once  Daily 13)  Neurontin 300 Mg Caps (Gabapentin) .[name].. 1 By Mouth Three Times A Day 14)  Glimepiride 2 Mg Tabs (Glimepiride) .[name].. 1 Once Daily 15)  Imodium A-D 2 Mg Tabs (Loperamide Hcl) .... As Needed 16)  Glycopyrrolate 2 Mg Tabs (Glycopyrrolate) .... One Tablet By Mouth Two Times A Day 17)  Levothroid 25 Mcg Tabs (Levothyroxine Sodium) .[name].. 1 By Mouth Qd  Allergies (verified): No Known Drug Allergies  Past History:  Past Medical History: Reviewed history from 01/05/2010 and no changes required. DEPRESSION (ICD-311), PMH of SEIZURE DISORDER (ICD-780.39),PMH of DYSLIPIDEMIA (ICD-272.4): LDL goal = < 70 based on NMR Lipoprofile ARTHRITIS (ICD-716.90) FATTY LIVER DISEASE (ICD-571.8) SLEEP APNEA, OBSTRUCTIVE (ICD-327.23) OBESITY (ICD-278.00) HIATAL HERNIA (ICD-553.3) HEMANGIOMA (ICD-228.00)  Hepatic , "giant hemangioma" 8.7 cm in greatest diameter, Dr Russella Dar COLONIC POLYPS, ADENOMATOUS (ICD-211.3) Subendocadrial  Myocardial infarction 2025following R TKR GERD Gastritis Diabetes mellitus, type II Hypothyroidism , diagnosed 2010, Dr Corliss Skains IBS Anemia Hx of UTI Hyperlipidemia Hypertension  Past Surgical History: Reviewed history from 02/22/2009 and no changes required. Cholecystectomy 1985 Hysterectomy 1988 Tubal ligation 1986 Right knee replacement 11-2005;  Cath 2007, Dr Elsie Lincoln left total knee replacement 11-07-08 per Dr. Chaney Malling steroid injection to the left SI joint 12-2008 per Dr. Alvester Morin  Family History: Reviewed history from  01/05/2010 and no changes required. No FH of Colon Cancer: Family History of Colon Polyps: Brother, Mother Family History of Diabetes: Sister, Paternal Aunt No MI or CVA in FH Family History of Irritable Bowel Syndrome: Sister  Social History: Reviewed history from 01/05/2010 and no changes required. 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  Alcohol Use - no Daily Caffeine Use-4 diet colas  daily Illicit Drug Use - no Patient does not get regular exercise due to knee.   Review of Systems       The patient complains of back pain, fatigue, headaches-new, and urination changes/pain.         The pertinent positives and negatives are noted as above and in the HPI. All other ROS were reviewed and were negative.   Vital Signs:  Patient profile:   60 year old female Height:      63.75 inches Weight:      308 pounds BMI:     53.48 BSA:     2.35 Pulse rate:   96 / minute Pulse rhythm:   regular BP sitting:   134 / 76  (left arm) Cuff size:   large  Vitals Entered By: Ok Anis CMA (February 17, 2010 3:35 PM)  Physical Exam  General:  Well developed, well nourished, no acute distress. obese.   Head:  Normocephalic and atraumatic. Eyes:  PERRLA, no icterus. Ears:  Normal auditory acuity. Mouth:  No deformity or lesions, dentition normal. Neck:  Supple; no masses or thyromegaly. Lungs:  Clear throughout to auscultation. Heart:  Regular rate and rhythm; no murmurs, rubs,  or bruits. Abdomen:  Soft, nontender and nondistended. No masses, hepatosplenomegaly or hernias noted. Normal bowel sounds. Rectal:  deferred until time of colonoscopy.   Msk:  Symmetrical with no gross deformities. Normal posture. Pulses:  Normal pulses noted. Extremities:  No clubbing, cyanosis, edema or deformities noted. Neurologic:  Alert and  oriented x4;  grossly normal neurologically. Cervical Nodes:  No significant cervical adenopathy. Psych:  Alert and  cooperative. Normal mood and affect.  Impression & Recommendations:  Problem # 1:  DIARRHEA (ICD-787.91) Rule out microscopic colitis and inflammatory bowel disease. Increased frequency of Imodium usage. Continue glycopyrrolate. The risks, benefits and alternatives to colonoscopy with possible biopsy and possible polypectomy were discussed with the patient and they consent to proceed. The procedure will be scheduled electively.  Problem # 2:  DIAB W/O MENTION COMP TYPE II/UNS TYPE UNCNTRL (ICD-250.02) Standard preprocedure management per protocol.  Other Orders: Colonoscopy (Colon)  Patient Instructions: 1)  You have been scheduled for a colonoscopy. Please follow written prep instructions that were given to you today at your visit.  2)  Please pick up your prescription for Moviprep at the pharmacy. An electronic presription has already been sent.  3)  Copy sent to : Dr Marga Melnick 4)  The medication list was reviewed and reconciled.  All changed / newly prescribed medications were explained.  A complete medication list was provided to the patient / caregiver.  Prescriptions: MOVIPREP 100 GM  SOLR (PEG-KCL-NACL-NASULF-NA ASC-C) As per prep instructions.  #1 x 0   Entered by:   Lamona Curl CMA (AAMA)   Authorized by:   Meryl Dare MD [facility]   Signed by:   Lamona Curl CMA (AAMA) on 02/17/2010  Method used:   [phone]       [address]       [place], [place]  [id number]       Ph: [phone] or [phone]       Fax: [phone]   RxID:   [id number]

## 2010-02-25 NOTE — Letter (Signed)
Summary: [facility] Instructions  [facility]  [address] [place], [place] [address]   Phone: [phone]  Fax: [phone]       Alexis Grant    02-May-1950    MRN: [id number]        Procedure Day Dorna Bloom: Wednesday 02/18/10     Arrival Time: 9:00am     Procedure Time: 10:00 am     Location of Procedure:                    _ x_  [facility] (4th Floor)  PREPARATION FOR COLONOSCOPY WITH MOVIPREP    THE DAY BEFORE YOUR PROCEDURE         DATE: 02/17/10  DAY: Tuesday  1.  Drink clear liquids the entire day-NO SOLID FOOD  2.  Do not drink anything colored red or purple.  Avoid juices with pulp.  No orange juice.  3.  Drink at least 64 oz. (8 glasses) of fluid/clear liquids during the day to prevent dehydration and help the prep work efficiently.  CLEAR LIQUIDS INCLUDE: Water Jello Ice Popsicles Tea (sugar ok, no milk/cream) Powdered fruit flavored drinks Coffee (sugar ok, no milk/cream) Gatorade Juice: apple, white grape, white cranberry  Lemonade Clear bullion, consomm, broth Carbonated beverages (any kind) Strained chicken noodle soup Hard Candy                             4.  In the morning, mix first dose of MoviPrep solution:    Empty 1 Pouch A and 1 Pouch B into the disposable container    Add lukewarm drinking water to the top line of the container. Mix to dissolve    Refrigerate (mixed solution should be used within 24 hrs)  5.  Begin drinking the prep at 5:00 p.m. The MoviPrep container is divided by 4 marks.   Every 15 minutes drink the solution down to the next mark (approximately 8 oz) until the full liter is complete.   6.  Follow completed prep with 16 oz of clear liquid of your choice (Nothing red or purple).  Continue to drink clear liquids until bedtime.  7.  Before going to bed, mix second dose of MoviPrep solution:    Empty 1 Pouch A and 1 Pouch B into the disposable container    Add lukewarm drinking water to  the top line of the container. Mix to dissolve    Refrigerate  THE DAY OF YOUR PROCEDURE      DATE: 02/18/10 DAY: Wednesday  Beginning at 5:00 a.m. (5 hours before procedure):         1. Every 15 minutes, drink the solution down to the next mark (approx 8 oz) until the full liter is complete.  2. Follow completed prep with 16 oz. of clear liquid of your choice.    3. You may drink clear liquids until 8:00 am (2 HOURS BEFORE PROCEDURE).   MEDICATION INSTRUCTIONS  Unless otherwise instructed, you should take regular prescription medications with a small sip of water   as early as possible the morning of your procedure.  Diabetic patients - see separate instructions.        OTHER INSTRUCTIONS  You will need a responsible adult at least 60 years of age to accompany you and drive you home.   This person must remain in the waiting room during your procedure.  Wear loose fitting clothing that is  easily removed.  Leave jewelry and other valuables at home.  However, you may wish to bring a book to read or  an iPod/MP3 player to listen to music as you wait for your procedure to start.  Remove all body piercing jewelry and leave at home.  Total time from sign-in until discharge is approximately 2-3 hours.  You should go home directly after your procedure and rest.  You can resume normal activities the  day after your procedure.  The day of your procedure you should not:   Drive   Make legal decisions   Operate machinery   Drink alcohol   Return to work  You will receive specific instructions about eating, activities and medications before you leave.    The above instructions have been reviewed and explained to me by  Lamona Curl CMA (AAMA)  February 17, 2010 4:10 PM _    I fully understand and can verbalize these instructions _____________________________ Date _________

## 2010-02-26 ENCOUNTER — Telehealth: Payer: Self-pay | Admitting: Gastroenterology

## 2010-02-26 ENCOUNTER — Other Ambulatory Visit: Payer: 59

## 2010-03-02 ENCOUNTER — Other Ambulatory Visit: Payer: 59

## 2010-03-02 ENCOUNTER — Encounter (INDEPENDENT_AMBULATORY_CARE_PROVIDER_SITE_OTHER): Payer: Self-pay | Admitting: *Deleted

## 2010-03-02 DIAGNOSIS — R197 Diarrhea, unspecified: Secondary | ICD-10-CM

## 2010-03-04 ENCOUNTER — Encounter: Payer: Self-pay | Admitting: Gastroenterology

## 2010-03-05 NOTE — Progress Notes (Signed)
Summary: Biopsy results  Medications Added METRONIDAZOLE 500 MG TABS (METRONIDAZOLE) one tablet by mouth two times a day       Phone Note Call from Patient Call back at Work Phone [phone]   Caller: Patient Call For: Dr. Russella Dar Reason for Call: Lab or Test Results Summary of Call: Calling about her biopsy results Initial call taken by: Karna Christmas,  February 26, 2010 8:40 AM  Follow-up for Phone Call        Pt notified of biopsy results and patient is still having diarrhea every day. Pt wants to know after the negative biopsy results, what the next step would be? Do you want to see her back in the office? Follow-up by: Christie Nottingham CMA Duncan Dull),  February 26, 2010 8:57 AM  Additional Follow-up for Phone Call Additional follow up Details #1::        Pancreatic fecal elastase 1 metronidazole 500mg  by mouth two times a day for 10 days continue gylcopyrrolate and imodium REV in 2-4 weeks Additional Follow-up by: Meryl Dare MD Clementeen Graham,  February 26, 2010 9:42 AM    Additional Follow-up for Phone Call Additional follow up Details #2::    Left message for patient  to call back. Follow-up by: Christie Nottingham CMA Duncan Dull),  February 26, 2010 10:34 AM  Additional Follow-up for Phone Call Additional follow up Details #3:: Details for Additional Follow-up Action Taken: Rx was sent to pts pharmacy. Pt also told to come to the lab to have pancreatic fecal elastace 1 done today or tomorrow. Pt told to stay on glycopyrrolate and Imodium. REV scheduled for 03/13/10 at 2:45pm. Pt verbalized understanding. Additional Follow-up by: Christie Nottingham CMA (AAMA),  February 26, 2010 11:13 AM  New/Updated Medications: METRONIDAZOLE 500 MG TABS (METRONIDAZOLE) one tablet by mouth two times a day Prescriptions: METRONIDAZOLE 500 MG TABS (METRONIDAZOLE) one tablet by mouth two times a day  #20 x 0   Entered by:   Christie Nottingham CMA (AAMA)   Authorized by:   Meryl Dare MD [facility]   Signed  by:   Christie Nottingham CMA (AAMA) on 02/26/2010   Method used:   [facility]  [address] [phone]* (retail)       [address]       [place], [place]  [id number]       Ph: [phone] or [phone]       Fax: [phone]   RxID:   [id number]

## 2010-03-05 NOTE — Letter (Signed)
Summary: Patient Notice- Polyp Results  [facility]  [address] [place], [place] [address]   Phone: [phone]  Fax: [phone]        February 23, 2010 MRN: [id number]    [facility] [address] [place], [place]  [address]    Dear Ms. Parrow,  I am pleased to inform you that the colon polyp(s) removed during your recent colonoscopy was (were) found to be benign (no cancer detected) upon pathologic examination.  I recommend you have a repeat colonoscopy examination in 3 years to look for recurrent polyps, as having colon polyps increases your risk for having recurrent polyps or even colon cancer in the future. The random colon  biopsies were normal.  Should you develop new or worsening symptoms of abdominal pain, bowel habit changes or bleeding from the rectum or bowels, please schedule an evaluation with either your primary care physician or with me.  Continue treatment plan as outlined the day of your exam.  Please call us if you are having persistent problems or have questions about your condition that have not been fully answered at this time.  Sincerely,  Meryl Dare MD [facility]  This letter has been electronically signed by your physician.  Appended Document: Patient Notice- Polyp Results LETTER MAILED

## 2010-03-06 ENCOUNTER — Ambulatory Visit: Payer: 59 | Admitting: Internal Medicine

## 2010-03-10 ENCOUNTER — Ambulatory Visit: Payer: 59 | Admitting: Internal Medicine

## 2010-03-12 ENCOUNTER — Encounter: Payer: Self-pay | Admitting: Internal Medicine

## 2010-03-12 ENCOUNTER — Ambulatory Visit: Payer: Self-pay

## 2010-03-13 ENCOUNTER — Ambulatory Visit (INDEPENDENT_AMBULATORY_CARE_PROVIDER_SITE_OTHER): Payer: 59 | Admitting: Gastroenterology

## 2010-03-13 ENCOUNTER — Encounter: Payer: Self-pay | Admitting: Gastroenterology

## 2010-03-13 DIAGNOSIS — R197 Diarrhea, unspecified: Secondary | ICD-10-CM

## 2010-03-13 DIAGNOSIS — Z86010 Personal history of colonic polyps: Secondary | ICD-10-CM

## 2010-03-17 ENCOUNTER — Ambulatory Visit (INDEPENDENT_AMBULATORY_CARE_PROVIDER_SITE_OTHER): Payer: 59 | Admitting: Internal Medicine

## 2010-03-17 ENCOUNTER — Encounter: Payer: Self-pay | Admitting: Internal Medicine

## 2010-03-17 ENCOUNTER — Other Ambulatory Visit: Payer: Self-pay | Admitting: Internal Medicine

## 2010-03-17 DIAGNOSIS — R197 Diarrhea, unspecified: Secondary | ICD-10-CM

## 2010-03-17 DIAGNOSIS — E1169 Type 2 diabetes mellitus with other specified complication: Secondary | ICD-10-CM | POA: Insufficient documentation

## 2010-03-17 DIAGNOSIS — E1151 Type 2 diabetes mellitus with diabetic peripheral angiopathy without gangrene: Secondary | ICD-10-CM | POA: Insufficient documentation

## 2010-03-17 DIAGNOSIS — E039 Hypothyroidism, unspecified: Secondary | ICD-10-CM

## 2010-03-17 DIAGNOSIS — E119 Type 2 diabetes mellitus without complications: Secondary | ICD-10-CM

## 2010-03-17 NOTE — Assessment & Plan Note (Signed)
Summary: follow-up diarrhea   History of Present Illness Visit Type: Follow-up Visit Primary GI MD: Elie Goody MD [facility] Primary Provider: Marga Melnick, MD Requesting Provider: na Chief Complaint: follow-up diarrhea pt. states she is much better. History of Present Illness:    This is a 60 year old female has had complete resolution of her diarrhea after a course of metronidazole. She no longer needs Imodium.   GI Review of Systems      Denies abdominal pain, acid reflux, belching, bloating, chest pain, dysphagia with liquids, dysphagia with solids, heartburn, loss of appetite, nausea, vomiting, vomiting blood, weight loss, and  weight gain.        Denies anal fissure, black tarry stools, change in bowel habit, constipation, diarrhea, diverticulosis, fecal incontinence, heme positive stool, hemorrhoids, irritable bowel syndrome, jaundice, light color stool, liver problems, rectal bleeding, and  rectal pain.   Current Medications (verified): 1)  Simvastatin 20 Mg Tabs (Simvastatin) .[name].. 1 By Mouth Once Daily Pt Due For Labs Now 2)  Cymbalta 60 Mg  Cpep (Duloxetine Hcl) .[name].. 1 By Mouth Once Daily 3)  Dilt-Xr 240 Mg  Cp24 (Diltiazem Hcl) .[name].. 1 By Mouth Qd 4)  Ambien 10 Mg  Tabs (Zolpidem Tartrate) .[name].. 1 By Mouth Qhs 5)  Aspirin 81 Mg  Tbec (Aspirin) .... Take 1 Tablet By Mouth Once A Day 6)  Symbicort 160-4.5 Mcg/act  Aero (Budesonide-Formoterol Fumarate) .[name].. 1-2 Puffs Q 12 Hrs As Needed 7)  Depakote Er 500 Mg  Tb24 (Divalproex Sodium) .... Take 3 Tabs Qhs 8)  Propoxyphene N-Apap 100-650 Mg  Tabs (Propoxyphene N-Apap) .[name].. 1 By Mouth At Bedtime 9)  Janumet 50-500 Mg Tabs (Sitagliptin-Metformin Hcl) .... Take 1 By Mouth Two Times A Day**appointment Due** 10)  Freestyle Lite Test  Strp (Glucose Blood) .... Accu Check Two Times A Day 11)  Freestyle Lite Lancets .... As Directed 12)  Fluticasone Propionate 50 Mcg/act Susp (Fluticasone Propionate) .... 2 Sprays Each Nostril Once   Daily 13)  Neurontin 300 Mg Caps (Gabapentin) .[name].. 1 By Mouth Three Times A Day 14)  Glimepiride 2 Mg Tabs (Glimepiride) .[name].. 1 Once Daily 15)  Imodium A-D 2 Mg Tabs (Loperamide Hcl) .... As Needed 16)  Glycopyrrolate 2 Mg Tabs (Glycopyrrolate) .... One Tablet By Mouth Two Times A Day 17)  Levothroid 25 Mcg Tabs (Levothyroxine Sodium) .[name].. 1 By Mouth Qd 18)  Prednisone (Pak) 5 Mg Tabs (Prednisone) .... Take As Directed  Allergies (verified): No Known Drug Allergies  Past History:  Past Medical History: Reviewed history from 01/05/2010 and no changes required. 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 Cholecystectomy 1985 Hysterectomy 1988 Tubal ligation 1986 Right knee replacement 11-2005;  Cath 2007, Dr Elsie Lincoln left total knee replacement 11-07-08 per Dr. Chaney Malling steroid injection to the left SI joint 12-2008 per Dr. Alvester Morin  Family History: Reviewed history from 01/05/2010 and no changes required. No FH of Colon Cancer: Family History of Colon Polyps: Brother, Mother Family History of Diabetes: Sister, Paternal Aunt No MI or CVA in FH Family History of Irritable Bowel Syndrome: Sister  Social  History: Reviewed history from 01/05/2010 and no changes required. 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  Alcohol Use - no Daily Caffeine Use-4 diet colas  daily Illicit Drug Use - no Patient does not get regular exercise due to knee.   Review of Systems       The patient complains of headaches-new.         The pertinent positives and negatives are noted as above and in the HPI. All other ROS were reviewed and were negative.   Vital Signs:  Patient profile:   60 year old female Height:      63.75 inches Weight:      308 pounds BMI:     53.48 Pulse rate:   80 / minute Pulse rhythm:   regular BP sitting:   124 / 82  (right arm)  Vitals Entered By: Milford Cage NCMA (March 13, 2010 2:59 PM)  Physical Exam  General:  Well developed, well nourished, no acute distress. Head:  Normocephalic and atraumatic. Eyes:  PERRLA, no icterus. Mouth:  No deformity or lesions, dentition normal. Lungs:  Clear throughout to auscultation. Heart:  Regular rate and rhythm; no murmurs, rubs,  or bruits. Abdomen:  Soft, nontender and nondistended. No masses, hepatosplenomegaly or hernias noted. Normal bowel sounds. Neurologic:  Alert and  oriented x4;  grossly normal neurologically. Psych:  Alert and cooperative. Normal mood and affect.  Impression & Recommendations:  Problem # 1:  DIARRHEA (ICD-787.91)  Resolved after a course of metronidazole. Possible bacterial overgrowth, Giardia or another  metronidazole sensitive infection.  She also has ureteral bowel syndrome which appears to be well controlled on glycopyrrolate. Trial of  tapering and possibly discontinuing glycopyrrolate.  Problem # 2:  IRRITABLE BOWEL SYNDROME (ICD-564.1)  See above.  Problem # 3:  COLONIC POLYPS, ADENOMATOUS, HX OF (ICD-V12.72)  Surveillance colonoscopy February 2015.  Problem # 4:  FATTY LIVER DISEASE (ICD-571.8)  Long-term weight loss diet supervised by her  primary care physician.  Patient Instructions: 1)  Please continue current medications.  2)  Please schedule a follow-up appointment as needed.  3)  The medication list was reviewed and reconciled.  All changed / newly prescribed medications were explained.  A complete medication list was provided to the patient / caregiver.

## 2010-03-19 ENCOUNTER — Ambulatory Visit: Payer: Self-pay

## 2010-03-26 ENCOUNTER — Ambulatory Visit
Admission: RE | Admit: 2010-03-26 | Discharge: 2010-03-26 | Disposition: A | Discharge status: Home / Self Care | Payer: 59 | Source: Ambulatory Visit | Attending: Internal Medicine | Admitting: Internal Medicine

## 2010-03-26 DIAGNOSIS — Z1239 Encounter for other screening for malignant neoplasm of breast: Secondary | ICD-10-CM

## 2010-03-26 NOTE — Assessment & Plan Note (Signed)
Summary: Discuss cbg radings and DM med changes needed/kb   Vital Signs:  Patient profile:   60 year old female Weight:      300 pounds BMI:     52.09 Temp:     98.3 degrees F oral Pulse rate:   72 / minute Resp:     14 per minute BP sitting:   122 / 84  (left arm) Cuff size:   large  Vitals Entered By: Shonna Chock CMA (March 17, 2010 9:23 AM) CC: Discuss meds , Type 2 diabetes mellitus follow-up, Diarrhea   Primary Care Provider:  Marga Melnick, MD  CC:  Discuss meds , Type 2 diabetes mellitus follow-up, and Diarrhea.  History of Present Illness:      This is a 60 year old woman who presents for Type 2 diabetes mellitus follow-up.  The patient reports blurred vision as "double vision" when  fatigued. Weight is variable. She  denies polyuria. She has had self managed hypoglycemia 3X/week. She denies numbness of extremities, neuropathic pain, vomiting, orthostatic symptoms, poor wound healing, vision loss, and foot ulcer.  Since the last visit the patient reports good dietary compliance and not exercising regularly but active.  The patient has been measuring capillary blood glucose before breakfast ( average 140) and  2 hrs after lunch(average 165).  Since the last visit, the patient reports having had eye care by an Ophthalmologist and no foot care.  A1c is 6.2%( risk 24%,average sugar 132   ); it was 7.1 % in 08/2009(risk 42%, 157)      The patient also presents with Diarrhea.  The patient reports 3-4  stools per day, watery/unformed stools, voluminous stools, malodorous stools, fecal urgency, fecal soiling, nocturnal diarrhea, and fasting diarrhea, but denies blood in stool, mucus in stool, greasy stools, and alternating diarrhea/constipation.  Associated symptoms include increased thirst.  The patient denies fever, abdominal cramps, vomiting, joint pains, mouth ulcers, and eye redness.  The symptoms  have no specific trigger.  The symptoms are better with hypomotility agents &  Glycopyrrolate Rxed by Dr Russella Dar. .    Current Medications (verified): 1)  Simvastatin 20 Mg Tabs (Simvastatin) .[name].. 1 By Mouth Once Daily Pt Due For Labs Now 2)  Cymbalta 60 Mg  Cpep (Duloxetine Hcl) .[name].. 1 By Mouth Once Daily 3)  Dilt-Xr 240 Mg  Cp24 (Diltiazem Hcl) .[name].. 1 By Mouth Qd 4)  Ambien 10 Mg  Tabs (Zolpidem Tartrate) .[name].. 1 By Mouth Qhs 5)  Aspirin 81 Mg  Tbec (Aspirin) .... Take 1 Tablet By Mouth Once A Day 6)  Symbicort 160-4.5 Mcg/act  Aero (Budesonide-Formoterol Fumarate) .[name].. 1-2 Puffs Q 12 Hrs As Needed 7)  Depakote Er 500 Mg  Tb24 (Divalproex Sodium) .... Take 3 Tabs Qhs 8)  Propoxyphene N-Apap 100-650 Mg  Tabs (Propoxyphene N-Apap) .[name].. 1 By Mouth At Bedtime 9)  Janumet 50-500 Mg Tabs (Sitagliptin-Metformin Hcl) .... Take 1 By Mouth Two Times A Day**appointment Due** 10)  Freestyle Lite Test  Strp (Glucose Blood) .... Accu Check Two Times A Day 11)  Freestyle Lite Lancets .... As Directed 12)  Fluticasone Propionate 50 Mcg/act Susp (Fluticasone Propionate) .... 2 Sprays Each Nostril Once  Daily 13)  Neurontin 300 Mg Caps (Gabapentin) .[name].. 1 By Mouth Three Times A Day 14)  Glimepiride 2 Mg Tabs (Glimepiride) .[name].. 1 Once Daily 15)  Imodium A-D 2 Mg Tabs (Loperamide Hcl) .... As Needed 16)  Glycopyrrolate 2 Mg Tabs (Glycopyrrolate) .... One Tablet By Mouth Two Times  A Day 17)  Levothroid 25 Mcg Tabs (Levothyroxine Sodium) .[name].. 1 By Mouth Qd 18)  Vitamin D 2000 Unit Tabs (Cholecalciferol) .[name].. 1 By Mouth Once Daily 19)  Fish Oil 1500mg  .... 1 By Mouth Two Times A Day  Allergies (verified): No Known Drug Allergies  Past History:  Past Surgical History: Cholecystectomy 1985 Hysterectomy 1988 Tubal ligation 1986 Right knee replacement 11-2005;                                                                                                                                 Cath 2007, Dr Elsie Lincoln left total knee replacement 11-07-08 per Dr. Chaney Malling steroid injection to the  left SI joint 12-2008 per Dr. Alvester Morin Colon polypectomy ( 6 of 7 adenomatous ), 02/2010 , Dr Russella Dar  Physical Exam  General:  in no acute distress; alert,appropriate and cooperative throughout examination Eyes:  No corneal or conjunctival inflammation noted. EOMI. Perrla.No lid lag Neck:  No deformities, masses, or tenderness noted. Lungs:  Normal respiratory effort, chest expands symmetrically. Lungs are clear to auscultation, no crackles or wheezes. Heart:  Normal rate and regular rhythm. S1 and S2 normal without gallop, murmur, click, rub .S4 Pulses:  R and L carotid,radial,dorsalis pedis and posterior tibial pulses are full and equal bilaterally Extremities:  No clubbing, cyanosis, edema. No onycholysis Neurologic:  alert & oriented X3, sensation intact to light touch over feet, and DTRs symmetrical and normal. No tremor  Skin:  Intact without suspicious lesions or rashes Cervical Nodes:  No lymphadenopathy noted Axillary Nodes:  No palpable lymphadenopathy Psych:  memory intact for recent and remote, normally interactive, and good eye contact.  memory intact for recent and remote, normally interactive, and good eye contact.     Impression & Recommendations:  Problem # 1:  DIABETES MELLITUS, CONTROLLED (ICD-250.00)  Her updated medication list for this problem includes:    Aspirin 81 Mg Tbec (Aspirin) .[name]... Take 1 tablet by mouth once a day    Janumet 50-500 Mg Tabs (Sitagliptin-metformin hcl) .[name]... Take 1 by mouth two times a day    Glimepiride 2 Mg Tabs (Glimepiride) .[name]... 1/2 once daily  Her updated medication list for this problem includes:    Aspirin 81 Mg Tbec (Aspirin) .[name]... Take 1 tablet by mouth once a day    Janumet 50-500 Mg Tabs (Sitagliptin-metformin hcl) .[name]... Take 1 by mouth two times a day    Glimepiride 2 Mg Tabs (Glimepiride) .[name]... 1 once daily  Problem # 2:  DIARRHEA (ICD-787.91)  Her updated medication list for this problem includes:    Imodium A-d 2 Mg Tabs  (Loperamide hcl) .[name]... As needed  Her updated medication list for this problem includes:    Imodium A-d 2 Mg Tabs (Loperamide hcl) .[name]... As needed  Orders: Venipuncture ([address]109) TLB-TSH (Thyroid Stimulating Hormone) (84443-TSH)  Problem # 3:  HYPOTHYROIDISM (ICD-244.9)  Her updated medication list for this problem includes:  Levothroid 25 Mcg Tabs (Levothyroxine sodium) .[name]... 1 by mouth qd  Orders: Venipuncture (16109) TLB-TSH (Thyroid Stimulating Hormone) (84443-TSH)  Complete Medication List: 1)  Simvastatin 20 Mg Tabs (Simvastatin) .[name].. 1 by mouth once daily pt due for labs now 2)  Cymbalta 60 Mg Cpep (Duloxetine hcl) .[name].. 1 by mouth once daily 3)  Dilt-xr 240 Mg Cp24 (Diltiazem hcl) .[name].. 1 by mouth qd 4)  Ambien 10 Mg Tabs (Zolpidem tartrate) .[name].. 1 by mouth qhs 5)  Aspirin 81 Mg Tbec (Aspirin) .... Take 1 tablet by mouth once a day 6)  Symbicort 160-4.5 Mcg/act Aero (Budesonide-formoterol fumarate) .[name].. 1-2 puffs q 12 hrs as needed 7)  Depakote Er 500 Mg Tb24 (Divalproex sodium) .... Take 3 tabs qhs 8)  Propoxyphene N-apap 100-650 Mg Tabs (Propoxyphene n-apap) .[name].. 1 by mouth at bedtime 9)  Janumet 50-500 Mg Tabs (Sitagliptin-metformin hcl) .... Take 1 by mouth two times a day 10)  Freestyle Lite Test Strp (Glucose blood) .... Accu check two times a day 11)  Freestyle Lite Lancets  .... As directed 12)  Fluticasone Propionate 50 Mcg/act Susp (Fluticasone propionate) .... 2 sprays each nostril once  daily 13)  Neurontin 300 Mg Caps (Gabapentin) .[name].. 1 by mouth three times a day 14)  Glimepiride 2 Mg Tabs (Glimepiride) .... 1/2 once daily 15)  Imodium A-d 2 Mg Tabs (Loperamide hcl) .... As needed 16)  Glycopyrrolate 2 Mg Tabs (Glycopyrrolate) .... One tablet by mouth two times a day 17)  Levothroid 25 Mcg Tabs (Levothyroxine sodium) .[name].. 1 by mouth qd 18)  Vitamin D 2000 Unit Tabs (Cholecalciferol) .[name].. 1 by mouth once daily 19)  Fish Oil 1500mg   .... 1 by mouth two times a  day  Patient Instructions: 1)  Use  Align once daily as trial to treat diarrhea. Take Janumet with a meal. 2)  Please schedule a follow-up  Lab appointment in 4 months for  3)  HbgA1C ; 4)  Urine Microalbumin . (250.00)   Orders Added: 1)  Est. Patient Level IV [60454] 2)  Venipuncture [09811] 3)  TLB-TSH (Thyroid Stimulating Hormone) [91478-GNF]  Appended Document: Discuss cbg radings and DM med changes needed/kb

## 2010-03-26 NOTE — Letter (Signed)
Summary: [facility]  [facility]   Imported By: Lanelle Bal 03/18/2010 14:21:48  _____________________________________________________________________  External Attachment:    Type:   Image     Comment:   External Document

## 2010-04-01 ENCOUNTER — Telehealth (INDEPENDENT_AMBULATORY_CARE_PROVIDER_SITE_OTHER): Payer: Self-pay | Admitting: *Deleted

## 2010-04-07 NOTE — Progress Notes (Signed)
Summary: Glimepiride refill  Phone Note Refill Request Message from:  Fax from Pharmacy on April 01, 2010 12:45 PM  Refills Requested: Medication #1:  GLIMEPIRIDE 2 MG TABS 1/2 once daily   Notes: Directions on fax state:  Take 1 taablet by mouth every day [id number], [address] [place], [place]   phone [phone]    fax - [phone]    qty = 30  Next Appointment Scheduled: none Initial call taken by: Jerolyn Shin,  April 01, 2010 12:46 PM    Prescriptions: GLIMEPIRIDE 2 MG TABS (GLIMEPIRIDE) 1/2 once daily  #45 x 1   Entered by:   Shonna Chock CMA   Authorized by:   Marga Melnick MD   Signed by:   Shonna Chock CMA on 04/01/2010   Method used:   [phone][facility] (retail)       [address]       [place], [place]  [id number]       Ph: [phone] or [phone]       Fax: [phone]   RxID:   [id number]

## 2010-04-21 ENCOUNTER — Encounter: Payer: Self-pay | Admitting: Internal Medicine

## 2010-04-23 LAB — CBC
HCT: 28.8 % — ABNORMAL LOW (ref 36.0–46.0)
HCT: 32.8 % — ABNORMAL LOW (ref 36.0–46.0)
Hemoglobin: 11 g/dL — ABNORMAL LOW (ref 12.0–15.0)
Hemoglobin: 9.4 g/dL — ABNORMAL LOW (ref 12.0–15.0)
Hemoglobin: 13.9 g/dL (ref 12.0–15.0)
MCHC: 33.5 g/dL (ref 30.0–36.0)
MCHC: 34 g/dL (ref 30.0–36.0)
MCV: 82 fL (ref 78.0–100.0)
MCV: 82.3 fL (ref 78.0–100.0)
MCV: 82.9 fL (ref 78.0–100.0)
Platelets: 179 10*3/uL (ref 150–400)
Platelets: 209 10*3/uL (ref 150–400)
Platelets: 227 10*3/uL (ref 150–400)
Platelets: 235 10*3/uL (ref 150–400)
RBC: 3.5 MIL/uL — ABNORMAL LOW (ref 3.87–5.11)
RBC: 5.06 MIL/uL (ref 3.87–5.11)
RDW: 18 % — ABNORMAL HIGH (ref 11.5–15.5)
RDW: 18 % — ABNORMAL HIGH (ref 11.5–15.5)
RDW: 18.7 % — ABNORMAL HIGH (ref 11.5–15.5)
RDW: 18.4 % — ABNORMAL HIGH (ref 11.5–15.5)
WBC: 6.1 10*3/uL (ref 4.0–10.5)
WBC: 9.1 10*3/uL (ref 4.0–10.5)

## 2010-04-23 LAB — URINALYSIS, ROUTINE W REFLEX MICROSCOPIC
Glucose, UA: NEGATIVE mg/dL
Hgb urine dipstick: NEGATIVE
Ketones, ur: NEGATIVE mg/dL
Nitrite: NEGATIVE
Protein, ur: NEGATIVE mg/dL
Specific Gravity, Urine: 1.011 (ref 1.005–1.030)
pH: 6 (ref 5.0–8.0)

## 2010-04-23 LAB — GLUCOSE, CAPILLARY
Glucose-Capillary: 124 mg/dL — ABNORMAL HIGH (ref 70–99)
Glucose-Capillary: 139 mg/dL — ABNORMAL HIGH (ref 70–99)
Glucose-Capillary: 143 mg/dL — ABNORMAL HIGH (ref 70–99)
Glucose-Capillary: 148 mg/dL — ABNORMAL HIGH (ref 70–99)
Glucose-Capillary: 149 mg/dL — ABNORMAL HIGH (ref 70–99)
Glucose-Capillary: 150 mg/dL — ABNORMAL HIGH (ref 70–99)
Glucose-Capillary: 152 mg/dL — ABNORMAL HIGH (ref 70–99)
Glucose-Capillary: 156 mg/dL — ABNORMAL HIGH (ref 70–99)

## 2010-04-23 LAB — PROTIME-INR
INR: 1.17 (ref 0.00–1.49)
INR: 0.91 (ref 0.00–1.49)
Prothrombin Time: 14.8 seconds (ref 11.6–15.2)

## 2010-04-23 LAB — TYPE AND SCREEN: Antibody Screen: NEGATIVE

## 2010-04-23 LAB — BASIC METABOLIC PANEL
BUN: 3 mg/dL — ABNORMAL LOW (ref 6–23)
BUN: 4 mg/dL — ABNORMAL LOW (ref 6–23)
BUN: 7 mg/dL (ref 6–23)
CO2: 32 mEq/L (ref 19–32)
Calcium: 8.4 mg/dL (ref 8.4–10.5)
Calcium: 8.6 mg/dL (ref 8.4–10.5)
Chloride: 100 mEq/L (ref 96–112)
Chloride: 101 mEq/L (ref 96–112)
Chloride: 92 mEq/L — ABNORMAL LOW (ref 96–112)
Creatinine, Ser: 0.46 mg/dL (ref 0.4–1.2)
Creatinine, Ser: 0.48 mg/dL (ref 0.4–1.2)
GFR calc Af Amer: >60 mL/min (ref >60)
GFR calc Af Amer: >60 mL/min (ref >60)
GFR calc non Af Amer: >60 mL/min (ref >60)
GFR calc non Af Amer: >60 mL/min (ref >60)
Glucose, Bld: 127 mg/dL — ABNORMAL HIGH (ref 70–99)
Glucose, Bld: 144 mg/dL — ABNORMAL HIGH (ref 70–99)
Potassium: 3.3 mEq/L — ABNORMAL LOW (ref 3.5–5.1)
Potassium: 5 mEq/L (ref 3.5–5.1)
Sodium: 139 mEq/L (ref 135–145)
Sodium: 139 mEq/L (ref 135–145)

## 2010-04-23 LAB — URINE CULTURE: Colony Count: NO GROWTH

## 2010-04-23 LAB — COMPREHENSIVE METABOLIC PANEL
ALT: 16 U/L (ref 0–35)
AST: 18 U/L (ref 0–37)
Alkaline Phosphatase: 78 U/L (ref 39–117)
GFR calc Af Amer: >60 mL/min (ref >60)
Glucose, Bld: 116 mg/dL — ABNORMAL HIGH (ref 70–99)
Potassium: 3.9 mEq/L (ref 3.5–5.1)
Sodium: 137 mEq/L (ref 135–145)
Total Protein: 7 g/dL (ref 6.0–8.3)

## 2010-04-23 LAB — DIFFERENTIAL
Basophils Relative: 1 % (ref 0–1)
Eosinophils Absolute: 0.3 10*3/uL (ref 0.0–0.7)
Eosinophils Relative: 3 % (ref 0–5)
Monocytes Absolute: 0.7 10*3/uL (ref 0.1–1.0)
Monocytes Relative: 7 % (ref 3–12)
Neutrophils Relative %: 54 % (ref 43–77)

## 2010-04-23 LAB — HEMOGLOBIN A1C
Hgb A1c MFr Bld: 6 % (ref 4.6–6.1)
Mean Plasma Glucose: 126 mg/dL

## 2010-04-23 LAB — IRON: Iron: 16 ug/dL — ABNORMAL LOW (ref 42–135)

## 2010-04-23 LAB — LACTATE DEHYDROGENASE: LDH: 165 U/L (ref 94–250)

## 2010-04-23 LAB — URINE MICROSCOPIC-ADD ON

## 2010-04-25 ENCOUNTER — Other Ambulatory Visit: Payer: Self-pay | Admitting: Internal Medicine

## 2010-04-27 ENCOUNTER — Telehealth: Payer: Self-pay | Admitting: *Deleted

## 2010-04-27 MED ORDER — DIAZEPAM 5 MG PO TABS: 5.0000 mg | ORAL_TABLET | Freq: Three times a day (TID) | ORAL | Status: AC | PRN

## 2010-04-27 NOTE — Telephone Encounter (Signed)
Pt aware rx sent to pharmacy and to schedule ov if no better.

## 2010-04-27 NOTE — Telephone Encounter (Signed)
Diazepam 5 mg every 8 hrs prn #15. OV if no better

## 2010-05-02 ENCOUNTER — Inpatient Hospital Stay (INDEPENDENT_AMBULATORY_CARE_PROVIDER_SITE_OTHER)
Admission: RE | Admit: 2010-05-02 | Discharge: 2010-05-02 | Disposition: A | Discharge status: Home / Self Care | Payer: 59 | Source: Ambulatory Visit | Attending: Family Medicine | Admitting: Family Medicine

## 2010-05-02 DIAGNOSIS — J019 Acute sinusitis, unspecified: Secondary | ICD-10-CM

## 2010-05-04 ENCOUNTER — Other Ambulatory Visit (HOSPITAL_COMMUNITY): Payer: Self-pay | Admitting: Orthopaedic Surgery

## 2010-05-04 DIAGNOSIS — T84030A Mechanical loosening of internal right hip prosthetic joint, initial encounter: Secondary | ICD-10-CM

## 2010-05-04 DIAGNOSIS — T84033A Mechanical loosening of internal left knee prosthetic joint, initial encounter: Secondary | ICD-10-CM

## 2010-05-11 ENCOUNTER — Other Ambulatory Visit (HOSPITAL_COMMUNITY): Payer: 59

## 2010-05-11 ENCOUNTER — Encounter (HOSPITAL_COMMUNITY)
Admission: RE | Admit: 2010-05-11 | Discharge: 2010-05-11 | Disposition: A | Discharge status: Home / Self Care | Payer: 59 | Source: Ambulatory Visit | Attending: Orthopaedic Surgery | Admitting: Orthopaedic Surgery

## 2010-05-11 DIAGNOSIS — Z96659 Presence of unspecified artificial knee joint: Secondary | ICD-10-CM | POA: Insufficient documentation

## 2010-05-11 DIAGNOSIS — M25559 Pain in unspecified hip: Secondary | ICD-10-CM | POA: Insufficient documentation

## 2010-05-11 DIAGNOSIS — M25569 Pain in unspecified knee: Secondary | ICD-10-CM | POA: Insufficient documentation

## 2010-05-11 DIAGNOSIS — T84030A Mechanical loosening of internal right hip prosthetic joint, initial encounter: Secondary | ICD-10-CM

## 2010-05-11 DIAGNOSIS — T84033A Mechanical loosening of internal left knee prosthetic joint, initial encounter: Secondary | ICD-10-CM

## 2010-05-11 MED ORDER — TECHNETIUM TC 99M MEDRONATE IV KIT: 23.7000 | PACK | Freq: Once | INTRAVENOUS | Status: AC | PRN

## 2010-05-12 ENCOUNTER — Ambulatory Visit: Payer: 59 | Admitting: Internal Medicine

## 2010-05-12 DIAGNOSIS — Z0289 Encounter for other administrative examinations: Secondary | ICD-10-CM

## 2010-05-18 ENCOUNTER — Other Ambulatory Visit: Payer: Self-pay | Admitting: Orthopaedic Surgery

## 2010-05-18 DIAGNOSIS — M545 Low back pain: Secondary | ICD-10-CM

## 2010-05-21 ENCOUNTER — Other Ambulatory Visit: Payer: 59

## 2010-05-28 ENCOUNTER — Ambulatory Visit
Admission: RE | Admit: 2010-05-28 | Discharge: 2010-05-28 | Disposition: A | Discharge status: Home / Self Care | Payer: 59 | Source: Ambulatory Visit | Attending: Orthopaedic Surgery | Admitting: Orthopaedic Surgery

## 2010-05-28 DIAGNOSIS — M545 Low back pain: Secondary | ICD-10-CM

## 2010-06-02 NOTE — Assessment & Plan Note (Signed)
[facility]                         GASTROENTEROLOGY OFFICE NOTE   NAME:Alexis Grant, Alexis Grant                      MRN:          [id number]  DATE:08/31/2006                            DOB:          09/09/1950    This is a return office visit for followup of hepatic hemangiomas.  Her  last imaging study was an MRI of the abdomen with and without contrast  performed on September 05, 2005, which showed a total of three hepatic  hemangiomas with the largest measuring 8.7 cm in greatest dimension,  felt to be consistent with a giant hemangioma.  Hepatomegaly, hepatic  steatosis, a small hiatal hernia and a probable lipoma of the transverse  portion of the duodenum was noted on the study.  She is interested in  considering gastric bypass surgery for treatment of obesity.   CURRENT MEDICATIONS:  Listed on the chart.  Updated and reviewed.   MEDICATION ALLERGIES:  None known.   PHYSICAL EXAM:  Morbidly obese, no acute distress.  Weight 323 pounds,  blood pressure 118/74, pulse 72 and regular.  She is not re-examined.   ASSESSMENT AND PLAN:  1. Three hepatic hemangiomas with one giant hemangioma measuring 8.7      centimeters in greatest dimension on MRI.  Will plan to repeat her      hepatic MRI with and without contrast.  If the hemangiomas remain      stable in size, will plan for a 1 year followup imaging study.  2. Morbid obesity with fatty liver, obstructive sleep apnea and      bilateral knee problems likely related to obesity.  She appears to      be a reasonable candidate for consideration of gastric bypass      surgery.  A referral will be made to [facility] for      further evaluation.  I have asked her to further discuss this with      Dr. Alwyn Ren as well.  She is due for colorectal cancer screening and      colonoscopy was recommended.  We will plan to proceed with that      after the results of her MRI are available.     Venita Lick.  Russella Dar, MD, [facility]  Electronically Signed    MTS/MedQ  DD: 08/31/2006  DT: 09/01/2006  Job #: [id number]   cc:   Titus Dubin. Alwyn Ren, MD,FACP,FCCP

## 2010-06-03 ENCOUNTER — Ambulatory Visit (INDEPENDENT_AMBULATORY_CARE_PROVIDER_SITE_OTHER): Payer: 59 | Admitting: Internal Medicine

## 2010-06-03 ENCOUNTER — Encounter: Payer: Self-pay | Admitting: Internal Medicine

## 2010-06-03 DIAGNOSIS — J40 Bronchitis, not specified as acute or chronic: Secondary | ICD-10-CM | POA: Insufficient documentation

## 2010-06-03 MED ORDER — ALBUTEROL SULFATE HFA 108 (90 BASE) MCG/ACT IN AERS: 2.0000 | INHALATION_SPRAY | Freq: Four times a day (QID) | RESPIRATORY_TRACT | Status: DC | PRN

## 2010-06-03 MED ORDER — AZITHROMYCIN 250 MG PO TABS: ORAL_TABLET | ORAL | Status: AC

## 2010-06-03 MED ORDER — BUDESONIDE-FORMOTEROL FUMARATE 160-4.5 MCG/ACT IN AERO: 1.0000 | INHALATION_SPRAY | Freq: Two times a day (BID) | RESPIRATORY_TRACT | Status: DC

## 2010-06-03 NOTE — Patient Instructions (Addendum)
Rest, fluids , tylenol For cough, take Mucinex DM twice a day as needed  Go back to symbicort twice a day every day Ventolin as needed 4 times a day if whhezing Take the antibiotic as prescribed (zpack) Call if no better in few days Call anytime if the symptoms are severe, you have high fever, short of breath, chest pain Please see Hopp next month for your regular check up!

## 2010-06-03 NOTE — Progress Notes (Signed)
  Subjective:    Patient ID: Alexis Grant, female    DOB: Jun 25, 1950, 60 y.o.   MRN: [id number]  HPI 6 days history of sore throat postnasal dripping. In the last few days"the infection got in my chest"  Past Medical History  Diagnosis Date  . Depression   . Seizure disorder   . Dyslipidemia   . Arthritis   . OSA (obstructive sleep apnea)   . Obesity   . Hiatal hernia   . Hemangioma   . Hx of adenomatous colonic polyps   . Myocardial infarct     subendocardrial, following R TKR  . GERD (gastroesophageal reflux disease)   . Gastritis   . Diabetes mellitus   . Hypothyroidism 2010    Dr.Deveshawr  . IBS (irritable bowel syndrome)   . Anemia   . UTI (lower urinary tract infection)   . Hyperlipidemia      Review of Systems Coughing with green sputum. Some wheezing. Mild shortness of breath. She was taken Symbicort however she ran out a month ago. Has not been using Ventolin recently, usually between episodes of wheezing she feels very well.     Objective:   Physical Exam  Constitutional: She is oriented to person, place, and time. She appears well-developed. She appears distressed.  HENT:  Head: Normocephalic and atraumatic.  Right Ear: External ear normal.  Left Ear: External ear normal.  Nose: Nose normal.       Not tender to palpation in the maxillary sinuses  Cardiovascular: Normal rate, regular rhythm and normal heart sounds.   No murmur heard. Pulmonary/Chest:       Good air movement, some rhonchi bilaterally, more noticeable when she coughs. No actual wheezing at the time of exam, no increased work of breathing  Musculoskeletal: She exhibits no edema.  Neurological: She is alert and oriented to person, place, and time.  Skin: She is not diaphoretic.          Assessment & Plan:

## 2010-06-03 NOTE — Assessment & Plan Note (Signed)
Symptoms consistent with bronchitis, see  instructions 

## 2010-06-05 NOTE — Consult Note (Signed)
Alexis Grant, HEPP               ACCOUNT NO.:  [id number]   MEDICAL RECORD NO.:  [id number]          PATIENT TYPE:  INP   LOCATION:  3738                         FACILITY:  [facility]   PHYSICIAN:  Valetta Mole. Swords, MD    DATE OF BIRTH:  Jan 02, 1951   DATE OF CONSULTATION:  DATE OF DISCHARGE:                                 CONSULTATION   REQUESTING PHYSICIAN:  Madaline Savage, M.D.   Ms. Quinby is a 60 year old female who I am asked to see by Dr. Elsie Lincoln  regarding chest discomfort.  The patient has a complicated medical  history but presents with a 1-day history of chest discomfort which  apparently was associated with EKG abnormalities (that EKG is not  available for review).  Cardiac enzymes were elevated as was a D-dimer.  CT angiogram of the pulmonary arteries was negative for pulmonary  emboli.  Cardiac enzymes were significantly elevated.  The patient was  taken to the cath lab.  Verbal report from Dr. Truett Perna physician  extender is that the catheterization was normal.  Currently, the patient  has no pain.  Previously, it was described as a sharp discomfort in the  left anterior chest wall which radiated to the left arm.  It was also  associated with minimal nausea and some diaphoresis.  She denies any  shortness of breath.  She was at rest when the pain came on.  She has  not noted any chest discomfort with exertion.  It is worth noting that  she had a recent total knee arthroplasty which was complicated with post-  op bleeding and hypoxia.   PAST MEDICAL HISTORY:  1. Sleep apnea.  2. History of hyperlipidemia.  3. History of seizure disorder.  4. Degenerative arthritis.  5. Morbid obesity.  6. Sleep apnea.  7. Fibromyalgia.   MEDICATIONS:  Prior to admission:  1. Effexor 75 mg p.o. every day.  2. Relafen 500 mg p.o. p.r.n.  3. Allegra 180 mg p.o. every day.  4. Depakote ER 500 mg, 3 p.o. every day.  5. Simvastatin 20 mg p.o. every day.  6. Ambien CR 12.5 mg p.o.  q.h.s.  7. Lyrica 100 mg b.i.d.  8. Nortriptyline 75 mg p.o. q.h.s.  9. Nexium 40 mg p.o. every day.  10.Oxycodone p.r.n.  11.Aspirin 325 mg p.o. every day.   FAMILY HISTORY:  No significant heart disease except for mother status  post her aortic valve replacement, has a sister with diabetes.   SHE HAS NO KNOWN DRUG ALLERGIES.   SOCIAL HISTORY:  She is a widow.  She does not use alcohol or tobacco.   PHYSICAL EXAMINATION:  VITAL SIGNS:  Blood pressure 120/60, pulse 80,  respirations 16, saturation is 95%.  GENERAL:  She appears as a morbidly obese female in no acute distress.  HEENT:  Atraumatic, normocephalic.  Extraocular muscles are intact.  NECK:  Supple without lymphadenopathy, thyromegaly, jugular venous  distention, or carotid bruits.  CHEST:  Clear to auscultation without any increased work of breathing.  CARDIAC:  S1 S2 are regular without murmurs or gallops.  There is no  palpable chest discomfort.  ABDOMEN:  Morbid obesity, active bowel sounds, soft, nontender.  EXTREMITIES:  There is no clubbing or cyanosis.  She has a surgical scar  over her right knee.  There is 1+ pretibial edema bilaterally.  NEUROLOGIC:  She is alert and oriented without any motor or sensory  deficits.   LABORATORY:  Cardiac enzymes:  CK total maximum 222, CK-MB total 29.1,  relative index 13.1.  Initial troponin I was 0.25.  Maximum troponin I  5.96.  Lipid profile within normal limits, except HDL 39.  D-dimer  elevated at 1.71.  CBC, on admission, normal with a hemoglobin of 11.7.  Hemoglobin A1c 5.8%.   CT scan of the chest demonstrates no pulmonary emboli.  There is a right  hepatic lobe mass which needs followup.  Chest x-ray without acute  disease.  EKG is unable to be located at this moment.   ASSESSMENT/PLAN:  1. Chest discomfort, unknown etiology.  Confusing picture given      positive D-dimer, elevated cardiac enzymes.  CT of the chest      negative for pulmonary embolus and  coronary angiogram apparently      negative for obstructive disease.  I wonder about coronary artery      vasospasm.  It may be worth treating her prophylactically with      nitrates or calcium-channel blocker.  I will leave that decision to      cardiology.  2. She does have a history of gastroesophageal reflux disease and      hiatal hernia.  She takes a proton pump inhibitor daily.  I doubt      that is the cause of her chest discomfort.  3. Elevated D-dimer.  It may be reasonable to check a venous Doppler      ultrasound of her lower extremities but that would not explain her      symptoms, unless a pulmonary embolus was not seen on CT scan.   From my point-of-view, I think the patient can be discharged home after  a Doppler ultrasound as long as okay with cardiology.  Again, it may be  reasonable to treat the patient prophylactically with an anti-vasospasm  medication if possible.      Bruce Rexene Edison Swords, MD  Electronically Signed     BHS/MEDQ  D:  12/10/2005  T:  12/10/2005  Job:  [id number]

## 2010-06-05 NOTE — Discharge Summary (Signed)
NAMELAURENCIA, Alexis Grant               ACCOUNT NO.:  [id number]   MEDICAL RECORD NO.:  [id number]          PATIENT TYPE:  OBV   LOCATION:  3738                         FACILITY:  [facility]   PHYSICIAN:  Abelino Derrick, P.A.   DATE OF BIRTH:  [birth year]   DATE OF ADMISSION:  12/09/2005  DATE OF DISCHARGE:  12/12/2005                                 DISCHARGE SUMMARY   [name] apparently has a follow-up appointment with Dr. Russella Dar to follow  up for her hepatic hemangioma.  She knows to keep this.  Her TFTs were  repeated and she knows to contact Dr. Alwyn Ren for followup regarding these.      Abelino Derrick, P.ALenard Lance  D:  12/12/2005  T:  12/12/2005  Job:  [id number]   cc:   Madaline Savage, M.D.  Titus Dubin. Alwyn Ren, MD,FACP,FCCP  Venita Lick. Russella Dar, MD, [facility]

## 2010-06-05 NOTE — Letter (Signed)
October 10, 2005     Thereasa Distance A. Chaney Malling, M.D.  [address] [place], [place] [address]   RE:  TANEASHA, FUQUA  MRN:  [id number]  /  DOB:  11-03-1950   Dear Asencion Islam:   Thank you for referring Nino Parsley for preoperative evaluation.  She was  seen August 30 but report delayed until a nuclear stress test could be  completed.   She had severe degenerative disease which profoundly limits her  mobilization.  She has essentially no exercise due to the pain and must walk  with a cane.  She is pain free only when off of her feet.   Significantly she has fibromyalgia and has been seen by Dr. Donell Beers, a  psychiatrist.  He has adjusted her medications and prescribed Lyrica and  nortriptyline with benefit.   Her past medical history includes:  Surgery for HPV in 2006.  She has also  had total abdominal hysterectomy and bilateral salpingo oophorectomy for an  ovarian cyst.  She has had cholecystectomy and arthroscopic knee surgery.  Apparently prior to the abdominal hysterectomy she did have additional  vaginal surgeries.  She has a history of sleep apnea and possible seizure  disorder.   Family history is positive for hypertension, coronary artery disease,  rheumatoid arthritis and autoimmune liver disease in her mother.   Presently she is on:  1. Nexium 40 mg daily.  2. Soma 350 mg twice a day.  3. Relafen 500 mg as needed for pain.  4. Effexor 75 mg 3 daily.  5. Depakote ER 1500 mg daily.  6. Klonopin 1 mg 1/2 at bedtime.  7. Nortriptyline.  8. Lyrica.  Lyrica dose is 50 mg one each morning and two in the evening.   She has no known drug allergies.   She has never smoked.   Weight is 312.  Pulse 92.  Respiratory rate 20.  Blood pressure is 110/80.  She exhibits a tachycardia with a full murmur.  ABDOMEN:  Protuberant without masses or organomegaly.  There is dullness in  the upper quadrant but no definite organ enlargement.  She ambulates with a cane.  There is marked  crepitus of the knees.   There was a scab lesion of the right buttock.  This is related to an ulcer  she had sustained in July; she stated she has had an open sore for a month  over the right hip after being in [place].  This was treated with wet to dry  saline dressings and Bactroban.   At this time there is no evidence of active skin infections; specifically,  there is nothing to suggest methicillin-resistant Staphylococcus aureus .   She has a total cholesterol of 225 with low-density lipoprotein of 155.  Hemoglobin A1C were normal at 5.3.   I have recommended that she avoid white carbohydrates and any foods with  high fructose corn syrup as the 1st, 2nd, 3rd ingredient.  Simvastatin at  bedtime was recommended with a followup lipids after 10 weeks.   Her nuclear stress test was associated with chest pain but there was normal  blood pressure response and no EKG changes or profusion changes.   She is medically cleared for surgery but she is an extremely high risk due  to her metabolic syndrome and deconditioning.  You may wish to have  preoperative clearance from Dr. Orlin Hilding in reference to her seizure  disorder.   If medical problems arise she could be seen in consultation by the  [facility].  I would recommend the surgery be performed as an in  patient instead of one of the Outpatient [facility] Centers.   Sincerely,      Titus Dubin. Alwyn Ren, MD,FACP,FCCP   [facility]/MedQ  DD:  10/10/2005 DT:  10/12/2005 Job #:  [id number]

## 2010-06-05 NOTE — Assessment & Plan Note (Signed)
[facility]                         GASTROENTEROLOGY OFFICE NOTE   NAME:Alexis Grant, Alexis Grant                      MRN:          [id number]  DATE:12/28/2005                            DOB:          Aug 22, 1950    Alexis Grant is a 60 year old female who is status post total right knee  replacement on November 30, 2005. She was subsequently hospitalized on  December 09, 2005, for chest pain and was diagnosed with a  subendocardial myocardial infarction. She was discharged on December 12, 2005. Please see the enclosed discharge summary. She had unexplained  drop in hematocrit during her first hospitalization and underwent a CT  scan of the abdomen and pelvis on December 03, 2005, which revealed an 8-  cm mass in the inferior portion of the right lobe of the liver, which  has features consistent with a giant hemangioma. There was no evidence  of any retroperitoneal hemorrhage. She returns for followup of her liver  lesion. Liver function tests performed during her hospitalization were  normal on December 09, 2005. Her albumin was slightly low at 2.5, with a  low total protein of 5.2 at that time. She has no prior history of liver  disease except for a liver lesion noted at cholecystectomy in 1985. She  does have problems with intermittent heartburn and indigestion. Her  primary physician is Dr. Marga Melnick.   PAST MEDICAL HISTORY:  1. Subendocardial myocardial infarction on December 09, 2005.  2. Arthritis, status post right total knee replacement November 30, 2005.  3. Obesity.  4. Sleep apnea, on continuous positive airway pressure.  5. Dyslipidemia.  6. History of seizure disorder.  7. Depression.  8. Status post cholecystectomy in 1985.  9. Status post tubal ligation in 1986.  10.Status post hysterectomy in 1988.   CURRENT MEDICATIONS:  Listed on the chart; updated and reviewed.   MEDICATION ALLERGIES:  None known.   SOCIAL HISTORY AND  REVIEW OF SYSTEMS:  Per handwritten form.   PHYSICAL EXAMINATION:  Well-developed, well-nourished obese female in no  acute distress. Height: 5 feet, 4 inches. Weight is 290 pounds. Blood  pressure is 102/78, pulse is 88 and regular.  HEENT: Anicteric sclerae. Oropharynx clear.  CHEST: Clear to auscultation bilaterally.  CARDIAC: Regular rate and rhythm without murmurs appreciated.  ABDOMEN: Soft and nontender. Nondistended. Normoactive bowel sounds. No  palpable organomegaly, masses or hernias.  RECTAL: Deferred.  EXTREMITIES: Without clubbing, cyanosis or edema.  NEUROLOGIC: Alert and oriented x3. Grossly nonfocal.   ASSESSMENT/PLAN:  Giant hemangioma involving the inferior portion of the  right lobe of the liver which measures 8.3 cm in greatest dimension by  CT scan. Proceed with MR imaging to further evaluate this lesion and to  obtain additional confirmation that this does appear to be a hemangioma.  If the MRI is has findings typcial for a hemangiomas it should be  followed with interval imaging to assess for growth. Should the lesion  enlarge or become symptomatic, surgical management should be considered,  otherwise it will be followed by serial imaging studies. Return office  visit in 4 to 6 weeks.     Venita Lick. Russella Dar, MD, [facility]  Electronically Signed    MTS/MedQ  DD: 12/30/2005  DT: 12/30/2005  Job #: [id number]   cc:   Titus Dubin. Alwyn Ren, MD,FACP,FCCP

## 2010-06-05 NOTE — Assessment & Plan Note (Signed)
[facility]                                 ON-CALL NOTE   NAME:EARNESTForest, Pruden                        MRN:          [id number]  DATE:12/09/2005                            DOB:          1950-10-01    TELEPHONE TRIAGE NOTE   TIME RECEIVED:  8:54 a.m.   The caller is Lillia Abed.  She sees Dr. Alwyn Ren.  Date of birth December 04, 1950.  Telephone [phone].  Apparently, the patient got out of the  hospital yesterday after having had a total knee replacement.  She now  has a lot of urinary incontinence.  There is no burning.  No fever.  No  discomfort involved.  The caller would like for me to call in something  to stop her incontinence and also to order a home health nurse to assist  with the patient around the home.  My response is that no I cannot call  in this type of medication sight unseen.  She would need to be evaluated  for this problem.  Also, I cannot order home health aides on a holiday  like today.  I suggested she contact Dr. Frederik Pear office tomorrow to  have all of his taken care of.  In the meantime, I suggested the patient  wear a TENS or some type of adult protective garment.     Tera Mater. Clent Ridges, MD  Electronically Signed    SAF/MedQ  DD: 12/09/2005  DT: 12/09/2005  Job #: [id number]

## 2010-06-05 NOTE — Op Note (Signed)
NAME:  Alexis Grant, Alexis Grant                         ACCOUNT NO.:  [id number]   MEDICAL RECORD NO.:  [id number]                   PATIENT TYPE:  AMB   LOCATION:  DSC                                  FACILITY:  [facility]   PHYSICIAN:  Rodney A. Chaney Malling, M.D.           DATE OF BIRTH:  [birth year]   DATE OF PROCEDURE:  05/09/2003  DATE OF DISCHARGE:                                 OPERATIVE REPORT   PREOPERATIVE DIAGNOSIS:  Tear medial meniscus, left knee, and  osteoarthritis, left knee.   POSTOPERATIVE DIAGNOSIS:  Tear medial meniscus, left knee, and  osteoarthritis, left knee.   OPERATION:  Debridement torn posterior horn medial meniscus, left knee.   ANESTHESIA:  MAC.   SURGEON:  Lenard Galloway. Chaney Malling, M.D.   PATHOLOGY:  We arthroscoped the knee and very careful examination was  undertaken.  There was some early chondromalacia of the posterior aspect of  the patella.  The trochlea and notch area appeared fairly normal.  We  arthroscoped in the lateral compartment, the lateral femoral condyle and  lateral tibial plateau and entire circumference of the lateral meniscus was  absolutely normal as was the ACL.  In the medial compartment, there was some  fraying of articular cartilage in the weight bearing area of the medial  femoral condyle and this rated about a grade 3 cartilage damage.  Over the  medial tibial plateau, there was a fairly large area with total loss of all  articular cartilage and the underlying bone was completely exposed.  There  was fraying and tearing of the posterior horn of the medial meniscus.   PROCEDURE:  The patient was placed on the operating table in supine  position, pneumatic tourniquet was placed on the upper thigh.  The left leg  was placed in a leg holder and the entire left lower extremity was prepped  with DuraPrep and draped out in the usual manner.  Marcaine was placed to  infiltrate the puncture wounds.  An infusion cannula was placed in the  superomedial pouch and the knee distended with saline.  An anteromedial and  anterolateral portals were made and the arthroscope was introduced.  All the  pathology was seen in the medial compartment.  There was fraying and tearing  of the posterior horn of the medial meniscus and this was debrided through  both medial and lateral portals with a series of baskets followed by the  shaver.  The torn portion of the meniscus was debrided down and the  remainder of the rim was smoothed and balanced.  There was a large area of  total loss of articular cartilage of the weight bearing area of the medial  tibial plateau as noted above.  No other significant pathology could be  seen.  No loose bodies were noted.  Marcaine was then placed and a large,  bulky, compressive dressing applied.  The patient returned to the recovery  room in excellent  condition.  Technically, this procedure went extremely  well.   DISCHARGE INSTRUCTIONS:  1. To my office on Wednesday.  2. Percocet for pain.                                               Rodney A. Chaney Malling, M.D.   RAM/MEDQ  D:  05/09/2003  T:  05/09/2003  Job:  [id number]

## 2010-06-05 NOTE — Discharge Summary (Signed)
Alexis Grant, Alexis Grant               ACCOUNT NO.:  [id number]   MEDICAL RECORD NO.:  [id number]          PATIENT TYPE:  INP   LOCATION:  5039                         FACILITY:  [facility]   PHYSICIAN:  Lenard Galloway. Mortenson, M.D.DATE OF BIRTH:  11-Oct-1950   DATE OF ADMISSION:  11/30/2005  DATE OF DISCHARGE:  12/08/2005                               DISCHARGE SUMMARY   ADMISSION DIAGNOSIS:  Osteoarthritis of the right knee.   DISCHARGE DIAGNOSES:  1. Osteoarthritis of the right knee.  2. History of seizures.  3. Depression.  4. Migraines.  5. Acute respiratory failure.  6. Sleep apnea.  7. Hyperkalemia.  8. Postop anemia.   PROCEDURE:  Right total knee arthroplasty.   HISTORY:  A 60 year old white female with chronic constant severe knee  pain, right greater than left.  It is a sharp and aching pain, which is  worsening.  She has had corticosteroids without much help.  Has failed  conservative treatment.  Now is having difficulty with activities of  daily living.  Radiographically, has severe osteoarthritis.  Admitted at  this time for total joint replacement.   HOSPITAL COURSE:  A 60 year old female admitted November 30, 2005, after  appropriate laboratory studies were obtained as well as one gram of  Ancef IV on-call the operating room.  Was taken to the operating room  where she underwent a Right total knee arthroplasty with computer  navigation.  This was performed by Dr. Lenard Galloway. Mortenson, assisted by  Legrand Pitts Duffy, P.A.-C.  A PCA Dilaudid pump at full dose protocol was  used for pain management.  Ancef one gram IV q.8 h. x3 doses was  continued postoperatively.  The patient was placed on Arixtra 2.5 mg  subcu q.10 p.m. x7 days.  A Foley was placed intraopeartively.   CONSULTATIONS:  PT, OT, and care management were made.  PT for  weightbearing, 50% body weight on the right.  She did have a CPAP  ordered for her sleep apnea postoperatively.  She was also hyperkalemic.  Consultation with Dr. Corinda Gubler, internal medicine was ordered.  She was  then taken to the unit for her initial postop care.  This was a tele  bed.  A 500 normal sterile saline bolus was given and then 125 mL per  hour afterwards was then started.  Her  Dilaudid PCA was replaced and  reduced protocol and her Arixtra was held.  On the November 30, 2005,  Lasix 40 mg IV was given and CBC and BMET were ordered.  That occurred  at 1755 p.m.  At 1855, Dr. Posey Rea for a Lasix 80 mg IV.  At 1950, the  patient was given normal sterile saline bolus of 1,000 mL.  At 2040 on  the November 30, 2005, a repeat normal sterile saline bolus 1,000 mL x1  was given.  An ICU bed was then ordered instead of stepdown.  She did  well overnight and the Arixtra was then started on December 01, 2005.  A  stat portable chest x-ray and an H&H and ABGs on room air was ordered on  the  December 01, 2005, at 2115.  A transfer was held to tele.  An EKG  was ordered and CT with PE protocol for hypoxia was ordered.  Unfortunately, there was no axis being able to be found by the IV team  and a QT study was ordered instead.  The patient was placed on a  nonrebreather mask.  Was transfused 2 units of blood on December 02, 2005.  Daily dressing changes were also started on December 02, 2005.  CPM was restarted at 0 to 90 degrees as written.  She was weaned off her  PCA medicines.  A PICC line was placed on December 02, 2005.  Two units  of packed cells were given on December 02, 2005.  Her Arixtra was held  until further notice at that time.  Another 40 of Lasix IV x1 after the  2nd unit was ordered.  A DIC panel was ordered on December 02, 2005,  also.  On December 03, 2005, transfused 1 unit of packed cells also.  On  December 03, 2005, the LDH and urine myoglobin was ordered.  Hemoccult  stools x2 was also ordered.  A stat MRI of the liver and spleen to rule  out a bleed was ordered.  Also on December 03, 2005, at 1600  hours,  transfused another unit of packed cells.  A CT of the pelvis and abdomen  with or without contrast was then ordered at 1740.  On December 04, 2005, Depakote levels were ordered and the patient received 40 mEq of  Kay Ciel.  On the 18th, I felt that she was stable enough for transfer  and she was transferred to 5,000.  The Foley was discontinued on  December 06, 2005.  Arixtra was discontinued on the 20th and aspirin 325  mg daily with food was begun.  A rehab and OT consults were ordered.  However, it was felt that she had improved enough that discharge to her  home was indicated and on December 08, 2005, this was accomplished.  EKG  of December 01, 2005, reveals sinus tachycardia with nonspecific T-wave  abnormalities.  A CT of the pelvis on December 03, 2005, revealed no  evidence of retroperitoneal  hemorrhage or other acute findings.  A  chest x-ray of December 02, 2005, revealed right PICC line is directed  superiorly into the jugular vein, hip is not seen.  Persistent bibasilar  atelectasis is noted.   LABORATORY DATA:  Hemoglobin of 14.2, hematocrit 42.3%, white count  6,000, platelets 229,000.  Her hemoglobin dropped to 7.4 on the 15th of  November.  Discharge hemoglobin was 11.9, hematocrit 35.4%.  Occult  blood x2 revealed one positive and one negative test result.  Pro time  admission 12.6, INR 0.9, PTT 29.  Pro time on November 16, was 15.3, INR  1.2, PTT 36, fibrinogen 574, D-dimer 1.71, platelet was 132,000.  Preop  sodium 142, potassium 4, chloride 105, CO2 31, glucose 123, BUN 8,  creatinine 0.7, calcium 9.2, total protein 6.4, albumin 3.3, AST 21, ALT  19, ALP 72, total bilirubin 0.5.  Discharge sodium 138, potassium 3.9,  chloride 102, CO2 31, glucose 128, BUN 5, creatinine 0.5, calcium 7.9.  Glycosylated hemoglobin is 5.8.  Valproic acid 75, myoglobin 3.5.  Blood type was A+, antibody screen negative.  Transfused 4 units of packed  cells during her hospital  course.   DISCHARGE INSTRUCTIONS:  There is no restriction in her diet.  No  driving or  lifting for 6 weeks.  To use her crutches at 50%  weightbearing on the right leg.  May shower and bathe.  Followup with  instruction sheet.  Change her dressing daily and cover with a sterile  gauze.  She was started on Percocet 5/325 one to two tabs a day 4 hours  as needed for pain and aspirin 325 mg one tab daily with food.  She is  to followup with Dr. Alwyn Ren in 1 to 2 weeks for hemoglobin check.  Dr.  Chaney Malling in 2 weeks for postop surgery.  Dr. Judie Petit T. Russella Dar and GI on  December 17, 2005, at 1:15 p.m.  CPM 0 to 90 degrees 6 to 8 hours per  day.  She is to call Dr. Russella Dar if bright red bleeding per rectum is  noted or dark tar like stools occur.  She was discharged in improved  condition.      Oris Drone Petrarca, P.A.-C.    ______________________________  Lenard Galloway Chaney Malling, M.D.    BDP/MEDQ  D:  02/09/2006  T:  02/10/2006  Job:  [id number]

## 2010-06-05 NOTE — Op Note (Signed)
Alexis Grant, Alexis Grant               ACCOUNT NO.:  [id number]   MEDICAL RECORD NO.:  [id number]          PATIENT TYPE:  INP   LOCATION:  3106                         FACILITY:  [facility]   PHYSICIAN:  Lenard Galloway. Mortenson, M.D.DATE OF BIRTH:  1950/10/16   DATE OF PROCEDURE:  DATE OF DISCHARGE:                                 OPERATIVE REPORT   PREOPERATIVE DIAGNOSIS:  End-stage osteoarthritis, right knee.   POSTOPERATIVE DIAGNOSIS:  End-stage osteoarthritis, right knee.   OPERATION:  Total knee replacement on the right, glued in components using a  standard right femoral cemented component with a size 3 NVT keel tibial tray  cemented, 15 mm LCS standard sized poly insert, and a standard sized metal  backed patella cemented.   SURGEON:  Lenard Galloway. Chaney Malling, M.D.   ASSISTANT:  Diane Duffy.   ANESTHESIA:  General.   DESCRIPTION OF THE PROCEDURE:  The patient was placed on the operating table  in the supine position with a pneumatic tourniquet on the right lower  extremity.  The entire right lower extremity was prepped with DuraPrep and  draped in the usual manner.  The Vi-Drape had been placed over the operative  site.  An Esmarch was used to wrap out the leg, and the tourniquet was  elevated.  An incision was made above the patella and carried down to the  tibial tubercle in the midline.  Bleeders were coagulated.  Self-retaining  retractors were put in place.  A long median parapatellar incision was made  using cut and cautery.  Bovie was used to control bleeding.  The knee was  opened.  A synovectomy was done superiorly.  With the knee flexed to 90  degrees, both medial and lateral meniscus were excised.  Once cleanup had  been completed, Schanz pins were placed in the distal femur and the proximal  tibial in a standard manner.  The tibial and femoral arrays for the computer  assist was employed.  Once this was completed, registration of mechanical  access was done.  Following  computer promptings, the femoral head center,  tibial mechanical axis, the proximal tibia plateau and the femoral condyles  and epicondyles were all registered.  Once registration was completed,  attention was turned to the proximal tibia for tibial resection.  Settings  were registered.  Tibial guide was placed over the anterior aspect of the  tibia, and using computer promptings, this was placed in the appropriate  position of varus and valgus, flexion, and height.  Once this was to my  satisfaction, it was pinned with fixation pins.  The capture guard was  applied, and proximal tibia resected.  Using confirmation guide minor,  corrections were made.  Once this was completed, soft tissue balancing was  done with the knee in flexion and extension following tibial guards.  Some  soft tissues were released, especially medially because there was some varus  deformity.  Once this was completed, femoral implanting was registered, and  then femoral resection guide #1 was placed over the anterior aspect of the  femur.  Following computer promptings, the guide was placed  at the  appropriate level and locked in position.  Anterior and posterior cuts were  then made, and confirmation guide views to clean up the cuts anteriorly.  Guide #2 was placed over the distal end of the femur.  Following computer  promptings, the guide was  placed in the appropriate position and locked in  place and then the distal end of the femur was resected.  Once this was  completed, a spacer block was inserted.  A 15 mm was inserted with the knee  flexed and extended and excellent balancing of ligaments in both flexion and  extension.  The final femoral guide was placed on the distal end of the  femur and locked in place.  Chamfer cuts were made and drill holes placed in  the distal end of the femur.  At this point, the tibial subluxed anteriorly  using retractors.  A size 3 tibial trial was put in place, and this had   aligned __________ hip.  It was locked in place with fixation pins.  The  tower was applied, and a drill hole was placed in the proximal tibia.  The  winged keel was then driven down in the proximal tibia flush.  A trial  tibial poly was inserted, and a standard femoral component was placed over  the distal end of the femur, and the knee was reduced.  The knee was put  through full range of motion.  There was no AP drill.  There was good range  of motion in both extension and forward flexion.  Nice balancing of the  ligaments in both flexion and extension.  At this point the patella was  everted.  The posterior aspect of the patella was then amputated using a  patella cutting guide, and then the drill hole guide was placed in the  posterior aspect of the patella, and 3 drill holes were made.  The patella  trial was inserted.  The knee was then articulated again and put through  full range of motion with excellent stability.  Good flexion, good  extension.  All components were then removed.  Pulsatile lavage was used.  All debris was removed.  The glue was mixed simultaneously.  The final  components were placed on the back table.  Glue was placed over the tibial  tray and the femoral component on the posterior aspect of the patella.  The  glue was then placed on the proximal tibia as it was exposed.  The tibial  component was then inserted.  Excess glue was removed as the tibial  component was impacted.  Glue was placed on the distal end of the femur and  with heavy thumb pressure.  The femoral component was then placed over the  distal femur and driven home.  The poly trial had been inserted just prior  to this maneuver.  The glue was placed in the posterior aspect of the  patella.  The patella was clamped in place and held in place.  Excess glue  was removed, and the glue was allowed to cure.  Once this was completed, a small osteotome was used to remove all excess glue.  At this point,  the  trial poly was removed.  The knee was irrigated with copious amounts of  saline using pulsatile lavage of all debris and debris removed.  The  tourniquet was withdrawn.  Bleeders were coagulated.  No significant  bleeding was seen.  Good hemostasis was achieved.  The final tibial trial  poly was then selected and snap fit in place.  The knee put through full  range of motion.  I was extremely pleased with range of motion and stability  and balancing of all of the ligaments.  A Hemovac drain was placed in  position.  The long medial parapatellar incision was then closed with  interrupted Ethibond sutures.  A Marcaine pump was then inserted in the  subcutaneous area.  The subcutaneous tissue was then closed in layers using  Vicryl.  The skin was closed with stainless-steel staples.  A large bulky  compressive dressing was applied, and the patient was returned to the  recovery room in excellent condition.  Technically I was extremely pleased  with the final outcome.           ______________________________  Lenard Galloway. Chaney Malling, M.D.     RAM/MEDQ  D:  11/30/2005  T:  12/01/2005  Job:  [id number]

## 2010-06-05 NOTE — Discharge Summary (Signed)
Alexis Grant, Alexis Grant               ACCOUNT NO.:  [id number]   MEDICAL RECORD NO.:  [id number]          PATIENT TYPE:  OBV   LOCATION:  3738                         FACILITY:  [facility]   PHYSICIAN:  Madaline Savage, M.D.DATE OF BIRTH:  December 05, 1950   DATE OF ADMISSION:  12/09/2005  DATE OF DISCHARGE:  12/12/2005                                 DISCHARGE SUMMARY   DISCHARGE DIAGNOSES:  1. Subendocardial myocardial infarction this admission with positive      enzymes and EKG changes but normal coronaries at catheterization,      presumed coronary spasm.  2. Elevated D-dimer this admission with negative CT for pulmonary      embolism.  3. Recent right total knee replacement.  4. Obesity and sleep apnea, on continuous positive airway pressure.  5. Treated dyslipidemia.  6. Severe arthritis.  7. History of seizure disorder.   HOSPITAL COURSE:  Alexis Grant is a 60 year old female followed by Dr. Alwyn Ren  who recently had right total knee replacement.  She presented to emergency  room December 09, 2005 with chest pain.  She said she got out of bed, went  to the bathroom and after she returned she developed sharp left anterior  chest pain which radiated down her left arm.  She denied any associated  shortness of breath but she did have some nausea and diaphoresis.  She was  seen in the emergency room by Dr. Elsie Lincoln.  Her initial EKG was abnormal with  ST elevation in the septal and inferior leads.  A CT scan was done which was  negative for pulmonary embolism.  There is an incidental finding of a right  hepatic lobe mass that will require an MRI at some point to exclude  malignancy.  The patient has had an abdominal CT which showed an 8 cm mass  in the inferior right hepatic lobe which was consistent by CT with a giant  hemangioma.  The patient was admitted to telemetry.  Further enzymes came  back elevated with a CK peak of 222 with 29 MB and a troponin of 5.96.  LDL  was 66.  TSH is slightly  elevated at 7.85.  The patient had a  catheterization done on December 10, 2005 which revealed normal coronaries  and normal LV function.  Her Foley was pulled on the 24th and she had some  trouble with urinary incontinence.  A urine culture did come back positive  for greater than 100,000 E. coli which was sensitive to Cipro.  She was put  on Cipro 3-day course for this.  We also added Norvasc 2.5 mg a day to her  current medications as well as aspirin and nitroglycerin sublingual p.r.n.  The patient will need to follow up with Dr. Elsie Lincoln as an outpatient.  She  also needs to follow up with Dr. Alwyn Ren.  Discharge medications are as  follows:  Cipro 500 mg b.i.d. for 3 days, Effexor XR 75 mg once a day,  Depakote ER 500 mg three tablets a day, Lyrica 100 mg twice a day,  simvastatin 20 mg once a day, nortriptyline  25 mg three tablets h.s., Nexium  40 mg a day, aspirin 81 mg a day, felodipine 2.5 mg a day, nitroglycerin  sublingual p.r.n.  Laboratories:  As noted urine culture on the 23rd showed  greater than 100,000 E. coli sensitive to Cipro.  CT scan was negative for  pulmonary embolism but did show a right hepatic lobe mass which on previous  CT was felt to be possibly a hemangioma, an MRI was suggested on this most  recent CT.  On the 24th white count was 8.9, hemoglobin 11.8, hematocrit  32.7, platelets 366.  Sodium 142, potassium 4.2, BUN 5, creatinine 0.7,  hemoglobin A1c is 5.8.  TSH 7.83.  CKs peaked as noted 222 with 29 MBs and a  troponin of 5.96.  Cholesterol was 129, LDL 66, HDL 39.   DISPOSITION:  The patient is discharged in stable condition and will follow  up with Dr. Elsie Lincoln in the office.  She will need a follow-up TSH with TFTs  with her primary care doctor.  We will draw this before she goes home today  and repeat it.  She will also need to follow up for this hemangioma that was  found on CT.      Abelino Derrick, P.A.    ______________________________  Madaline Savage, M.D.    Lenard Lance  D:  12/12/2005  T:  12/12/2005  Job:  [id number]   cc:   Titus Dubin. Alwyn Ren, MD,FACP,FCCP

## 2010-06-05 NOTE — Cardiovascular Report (Signed)
NAMEDAMARYS, Alexis Grant               ACCOUNT NO.:  [id number]   MEDICAL RECORD NO.:  [id number]          PATIENT TYPE:  INP   LOCATION:  2807                         FACILITY:  [facility]   PHYSICIAN:  Madaline Savage, M.D.DATE OF BIRTH:  [birth year]   DATE OF PROCEDURE:  12/10/2005  DATE OF DISCHARGE:                              CARDIAC CATHETERIZATION   PROCEDURES PERFORMED:  1. Selective coronary angiography by Judkins technique.  2. Retrograde left heart catheterization.  3. Left ventricular angiography.  4. Successful right percutaneous femoral AngioSeal closure of the right      femoral artery.   COMPLICATIONS:  None.   ENTRY SITE:  Right femoral.   DYE USED:  Omnipaque.   PATIENT PROFILE:  Alexis Grant is a 60 year old white female who is a medical  patient of Dr. Marga Melnick and a cardiology patient of Dr. Daphene Jaeger who  is 2 weeks status post right total knee replacement.  She had had chest pain  and states that she got out of bed and went to the bathroom and when  returning to bed described a sharp chest pain in the anterior chest and down  the left arm.  There was sorry there was some nausea and diaphoresis, but no  syncope.  Postoperatively the patient developed hypoxemia and anemia and  tachycardia and the hypoxemia was thought to be due to sleep apnea and the  use of narcotics.  Past history shows that the patient suffers with morbid  obesity and has obstructive sleep apnea and is on CPAP.  She has  fibromyalgia, dyslipidemia, degenerative arthritis and seizure disorder.  She is widowed with three children and three grandchildren.  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  Today's procedure was  performed by the right percutaneous femoral approach using a Smart needle  for guidance because of inability to feel much of a pulse due to the layers  of adipose tissue.  The procedure was performed with minimal patient  discomfort after giving two doses of 1  mg Versed, 50 mg fentanyl for a total  of 2 mg Versed and 100 mg fentanyl.  AngioSeal closure was successful with  excellent hemostasis.   RESULTS:  Pressures:  Left ventricular pressure was 130/7 and diastolic  pressure 13.  Central aortic pressure 130/75, mean of 100.  No valve  gradient by pullback technique.   ANGIOGRAPHIC RESULTS:  All coronary arteries were patent and normal.  Anatomically the left main had a downward takeoff from the left coronary  sinus of Valsalva.  That left main was normal.  The LAD coursed to the  cardiac apex giving rise to one major diagonal branch and a septal branch.  No lesions were seen.  Circumflex was very tortuous, nondominant, containing  no lesions.  There were three obtuse marginal branches that were normal as  well.  The right coronary artery was large and dominant giving rise to a  large acute marginal branch, a posterior descending branch and  posterolateral branch.  No lesions were seen.   Left ventricular angiography showed LVEF of 60% without wall motion  abnormalities.  No LV filling defects.  No evidence of mitral regurgitation.   As previously stated, right percutaneous femoral artery Angio-Seal closure  was performed with excellent hemostasis and no evidence of any oozing after  the placement of the device.  This was done due to the patient's morbid  obesity.   FINAL DIAGNOSIS:  Recent episodes of chest pain.  I failed to mention  previously that the patient had on her 12-lead EKG of November 22 at 1941  hours 1 mm of ST-segment elevation in the inferior leads and also in V2-V4.  My suspicion after now knowing her coronary artery status is that these  changes represent early repolarization.   PLAN:  The patient will be observed overnight because of her obesity because  of pending labs and hopefully to observe her ambulate before discharge.           ______________________________  Madaline Savage, M.D.     WHG/MEDQ  D:   12/10/2005  T:  12/10/2005  Job:  [id number]   cc:   Titus Dubin. Alwyn Ren, MD,FACP,FCCP  Nicki Guadalajara, M.D.

## 2010-06-05 NOTE — Consult Note (Signed)
Alexis Grant, Alexis Grant               ACCOUNT NO.:  [id number]   MEDICAL RECORD NO.:  [id number]          PATIENT TYPE:  INP   LOCATION:  3106                         FACILITY:  [facility]   PHYSICIAN:  Coralyn Helling, MD        DATE OF BIRTH:  1950/08/11   DATE OF CONSULTATION:  12/02/2005  DATE OF DISCHARGE:                                   CONSULTATION   REASON FOR CONSULTATION:  Hypoxemia and hypertension.   Alexis Grant is a 60 year old female who is postoperative day two for a total  right knee replacement. She has a past medical history significant for  morbid obesity, degenerative arthritis, and obstructive sleep apnea.  Apparently, her initial surgical course was relatively uneventful.  She had  received a total of approximately 350 mL of fluid during the procedure and  had an approximate blood loss of 100 mL.  She was extubated postoperatively  and returned to the recovery room in stable condition.  However, she  subsequently developed tachycardia and was noted to have a significant  amount of oozing from her surgical site. She received a 2 liters fluid bolus  and this improved her tachycardia to some degree. However, she had developed  worsening hypotension with a blood pressure systolic in the 90s. She was  then noted to have a hemoglobin as low as 7.4 compared to a baseline of 14.2  and has received 2 units of packed red blood cells.  She is also noted to be  hypoxic with oxygen desaturations in spite of being on supplemental oxygen  and then placed on BiPap. She had a chest x-ray done on November14 which  showed decreased lung volumes and atelectasis.  She had a CT scan of the  chest which, again, showed the decreased lung volumes and atelectasis but no  evidence for pulmonary embolism, infiltrate or pleural effusion. She has  also been on a Dilaudid PCA for pain control which was discontinued earlier  in the day.   PAST MEDICAL HISTORY:  Significant for fibromyalgia, obstructive  sleep  apnea, morbid obesity, degenerative arthritis, and seizure disorder.   PAST SURGICAL HISTORY:  Significant for a total abdominal hysterectomy with  bilateral salpingo-oophorectomy and cholecystectomy.   ALLERGIES:  She has no known drug allergies.   CURRENT MEDICATIONS:  Arixtra which is on hold, Claritin 10 mg daily, Colace  100 mg b.i.d., Depakote 1500 mg q.h.s., Effexor XR 150 mg daily, Lyrica 100  mg daily and 200 mg at night, Protonix 80 mg daily, Senokot 1 tablet b.i.d.,  Zocor 20 mg q.h.s.   FAMILY HISTORY:  Significant for hypertension, coronary disease, rheumatoid  arthritis and autoimmune liver disease.   SOCIAL HISTORY:  The patient denies any history of tobacco or significant  alcohol use.   REVIEW OF SYSTEMS:  She complains of some intermittent nausea and pain at  the surgical site. Otherwise, essentially negative.   PHYSICAL EXAMINATION:  GENERAL:  She is seen in the intensive care unit.  She is arousable but  somewhat somnolent, able to follow commands appropriately and moves all four  extremities.  VITAL SIGNS:  Temperature is 98.2, blood pressure 94/50, heart rate is 105,  oxygen saturation is 100%, respiratory rate is 14.  HEENT:  Pupils were sluggish but reactive.  There is no sinus tenderness.  She has oral lesions.  No lymphadenopathy.  No thyromegaly.  HEART:  S1 and S2, tachycardiac.  CHEST:  She had bibasilar rales but no wheezing.  ABDOMEN:  Obese, soft, nontender positive bowel sounds.  GU:  No obvious lesions.  EXTREMITIES:  Her right knee bandage appears clean.  She has nonpitting  edema of the lower extremities.  NEUROLOGIC EXAM:  She is able to follow simple commands and move all four of  her extremities.   Chest x-ray and CT scan findings as stated above.  The most recent  laboratory values show arterial blood gas of 7.31, pCO2 of 55.3, pO2 58.8.  Hemoglobin is 7.4, hematocrit 21.2, WBC is 5.3, platelet count 141. Sodium  is 132,  potassium 4.4, chloride is 97, CO2 is 29, BUN is 10, creatinine 0.7,  glucose 144.  PT is 12.6, INR is 0.9, PTT 29. AST 21, ALT 19, ALP is 72,  bilirubin 0.5, albumin 2.3, calcium is 7.   IMPRESSION:  1. Postoperative hypoxemia in the setting of obstructive sleep apnea with      possible obesity hyperventilation syndrome with morbid obesity and      possible hypoventilation related to narcotic use for pain control.  I      would continue her on supplemental oxygen to keep her oxygen      saturations greater than 90%. I would also continue her on BiPap while      she is asleep and as needed during the day.  Her Dilaudid PCA has been      discontinued and hopefully this will help improve her ventilatory      status.  She also was having some degree of atelectasis which is likely      related to her hypoventilation, being bedridden, and her obesity.  She      is continued on her incentive spirometry and then she will also need to      be mobilized as early as possible with regards to her surgical status.  2. Tachycardia with anemia which is likely related to acute blood loss.      She appears to be reasonably hemodynamically stable at the present      time. She has received 2 units of packed red cells.  I would follow-up      on her hemoglobin level and depending upon this, would consider      additional transfusions. She should also have an anemia panel checked      to assess whether she would benefit from iron supplementation, as well.  3. Thrombocytopenia. I would follow-up on her CBC and check a DIC panel.      She has also had her Arixtra held for the present time.  4. She is postoperative day two for a right total knee replacement and the      plan for this will be as per orthopedic surgery.  5. History of seizure disorder and she is on antiseizure medications for      this.      Coralyn Helling, MD  Electronically Signed    VS/MEDQ  D:  12/02/2005  T:  12/02/2005  Job:  [id number]

## 2010-06-27 ENCOUNTER — Encounter: Payer: Self-pay | Admitting: Internal Medicine

## 2010-07-03 ENCOUNTER — Ambulatory Visit: Payer: 59 | Admitting: Internal Medicine

## 2010-07-04 ENCOUNTER — Encounter: Payer: Self-pay | Admitting: Internal Medicine

## 2010-07-06 ENCOUNTER — Encounter: Payer: Self-pay | Admitting: Internal Medicine

## 2010-07-06 ENCOUNTER — Ambulatory Visit (INDEPENDENT_AMBULATORY_CARE_PROVIDER_SITE_OTHER): Payer: 59 | Admitting: Internal Medicine

## 2010-07-06 DIAGNOSIS — I10 Essential (primary) hypertension: Secondary | ICD-10-CM

## 2010-07-06 DIAGNOSIS — E559 Vitamin D deficiency, unspecified: Secondary | ICD-10-CM

## 2010-07-06 DIAGNOSIS — E119 Type 2 diabetes mellitus without complications: Secondary | ICD-10-CM

## 2010-07-06 DIAGNOSIS — I214 Non-ST elevation (NSTEMI) myocardial infarction: Secondary | ICD-10-CM

## 2010-07-06 DIAGNOSIS — E785 Hyperlipidemia, unspecified: Secondary | ICD-10-CM

## 2010-07-06 DIAGNOSIS — IMO0001 Reserved for inherently not codable concepts without codable children: Secondary | ICD-10-CM

## 2010-07-06 DIAGNOSIS — E039 Hypothyroidism, unspecified: Secondary | ICD-10-CM

## 2010-07-06 LAB — MICROALBUMIN / CREATININE URINE RATIO
Creatinine,U: 101.1 mg/dL
Microalb Creat Ratio: 1.1 mg/g (ref 0.0–30.0)
Microalb, Ur: 1.1 mg/dL (ref 0.0–1.9)

## 2010-07-06 LAB — TSH: TSH: 3.03 u[IU]/mL (ref 0.35–5.50)

## 2010-07-06 LAB — HEPATIC FUNCTION PANEL
Albumin: 3.7 g/dL (ref 3.5–5.2)
Alkaline Phosphatase: 66 U/L (ref 39–117)
Bilirubin, Direct: 0.1 mg/dL (ref 0.0–0.3)
Total Protein: 7.1 g/dL (ref 6.0–8.3)

## 2010-07-06 LAB — LIPID PANEL
LDL Cholesterol: 100 mg/dL — ABNORMAL HIGH (ref 0–99)
Total CHOL/HDL Ratio: 3
Triglycerides: 108 mg/dL (ref 0.0–149.0)

## 2010-07-06 LAB — BUN: BUN: 8 mg/dL (ref 6–23)

## 2010-07-06 NOTE — Assessment & Plan Note (Signed)
Plan: TSH ideal goal = 1-3

## 2010-07-06 NOTE — Patient Instructions (Addendum)
Preventive Health Care: Exercise  30-45  minutes a day, 3-4 days a week. Walking is especially valuable in preventing Osteoporosis. Eat a low-fat diet with lots of fruits and vegetables, up to 7-9 servings per day. Avoid obesity; your goal = waist less than 35 inches.Consume less than 30 grams of sugar per day from foods & drinks with High Fructose Corn Syrup as #2,3 or #4 on label. Eye Doctor - have an eye exam @ least annually Do Isometric Exercises before standing Share this record & lab results with al lMDs seen

## 2010-07-06 NOTE — Assessment & Plan Note (Signed)
Plan: A1c goal = < 7% as long as no hypoglycemic episodes

## 2010-07-06 NOTE — Progress Notes (Signed)
Subjective:    Patient ID: Alexis Grant, female    DOB: May 20, 1950, 60 y.o.   MRN: [id number]  HPI #1  Allergy flare X 2 months; Zyrtec, Ventolin HFA, & Symbicort Rxed by Dr Drue Novel. These control symptoms.  #2 Diabetes status assessment: Fasting or morning glucose range:average :  120  . Highest glucose 2 hours after any meal:  < 160. Hypoglycemia :  no .                                                     Excess thirst : yes;  Excess hunger:  no ;  Excess urination:  no.                                  Lightheadedness with standing:  yes. Chest pain:  no ; Palpitations :no ;  Pain in  calves with walking:  no .                                                                                                                                 Non healing skin  ulcers or sores,especially over the feet:  no. Numbness or tingling or burning in feet : no .                                                                                                                                              Significant change in  Weight : no. Vision changes : blurring  .                                                                    Exercise : walking 1/2 mpd . Nutrition/diet:  No pattern. Medication compliance : yes. Medication adverse  Effects:  no . Eye exam : due 7/11. Foot care : no. #3 Dyslipidemia assessment: Prior  Advanced Lipid Testing: see NMR.   Family history of premature CAD/ MI: no .  Diabetes : A1c 6.2% in 2/12 . HTN: well controlled. Smoking history  : never .   ROS: fatigue: yes ; abd pain/bowel changes: intermittent watery stool ; myalgias:yes (Fibromyalgia, seeing Dr Corliss Skains);  syncope : no ; memory loss: some short term;skin changes: no. Lab results reviewed :copy given.     Review of Systems     Objective:   Physical Exam Gen.:  Alert, appropriate and cooperative throughout exam. Eyes: No corneal or conjunctival inflammation noted.  Neck: No deformities, masses, or  tenderness noted. Thyroid normal. Lungs: Normal respiratory effort; chest expands symmetrically. Lungs are clear to auscultation without rales, wheezes, or increased work of breathing. Heart: Normal rate and rhythm. Normal S1 and S2. No gallop, click, or rub. No murmur. Abdomen: Bowel sounds normal; abdomen soft and nontender. No masses, organomegaly or hernias noted. No clubbing, cyanosis, edema, or deformity noted.  Nail health  good. Vascular: Carotid, radial artery, dorsalis pedis and dorsalis posterior tibial pulses are full and equal. No bruits present. Neurologic: Alert and oriented x3. Deep tendon reflexes symmetrical  But 0-1/2+.Light touch normal over feet.        Skin: Intact without suspicious lesions or rashes. Lymph: No cervical, axillary, or inguinal lymphadenopathy present. Psych: Mood and affect are normal. Normally interactive                                                                                         Assessment & Plan:  #1 See Problem List & Assessment/Plans. #2 Postural Symptoms  Plan : see Orders

## 2010-07-06 NOTE — Assessment & Plan Note (Signed)
Vit D goal = >40.; monitor annually

## 2010-07-20 ENCOUNTER — Telehealth: Payer: Self-pay

## 2010-07-20 DIAGNOSIS — K769 Liver disease, unspecified: Secondary | ICD-10-CM

## 2010-07-20 NOTE — Telephone Encounter (Signed)
3 liver lesions which are again most consistent with hemangiomas.  The largest lesion, which is partially exophytic off the right lobe the liver demonstrates slight decrease since the prior exam. Fatty liver also noted.  Repeat hepatic MR in 07/2010   Signed by Meryl Dare MD [facility] on 08/11/2009 at 3:48 PM  ________________________________________________________________________ Left message for patient to call back    Signed by Darcey Nora RN, CGRN on 08/11/2009 at 4:41 PM  ________________________________________________________________________ Patient notified of radiology results    Signed by Darcey Nora RN, CGRN on 08/11/2009 at 4:45 PM  ________________________________________________________________________ The above is documentation on MRI from 07/2009.  Patient is advised of the above, she is scheduled for 08/03/10 10:15 arrival.  She is advised to be NPO for 4 hours prior

## 2010-07-20 NOTE — Telephone Encounter (Signed)
Message copied by Annett Fabian on Mon Jul 20, 2010 10:57 AM ------      Message from: Annett Fabian      Created: Mon Mar 23, 2010  3:57 PM       Needs follow up MRI

## 2010-07-23 ENCOUNTER — Other Ambulatory Visit: Payer: Self-pay | Admitting: Internal Medicine

## 2010-07-24 ENCOUNTER — Other Ambulatory Visit: Payer: Self-pay | Admitting: Internal Medicine

## 2010-08-03 ENCOUNTER — Other Ambulatory Visit: Payer: 59

## 2010-08-05 ENCOUNTER — Ambulatory Visit
Admission: RE | Admit: 2010-08-05 | Discharge: 2010-08-05 | Disposition: A | Discharge status: Home / Self Care | Payer: 59 | Source: Ambulatory Visit | Attending: Gastroenterology | Admitting: Gastroenterology

## 2010-08-05 DIAGNOSIS — K769 Liver disease, unspecified: Secondary | ICD-10-CM

## 2010-08-05 MED ORDER — GADOBENATE DIMEGLUMINE 529 MG/ML IV SOLN
20.0000 mL | Freq: Once | INTRAVENOUS | Status: AC | PRN
  Administered 2010-08-05: 20 mL via INTRAVENOUS

## 2010-09-02 IMAGING — CR DG CHEST 2V
1 series · 1 of 1 positions shown · non-contrast
Comparison: Chest 11/01/2008.

CLINICAL DATA: Cough and congestion.

CHEST - 2 VIEW

[w chest lat]
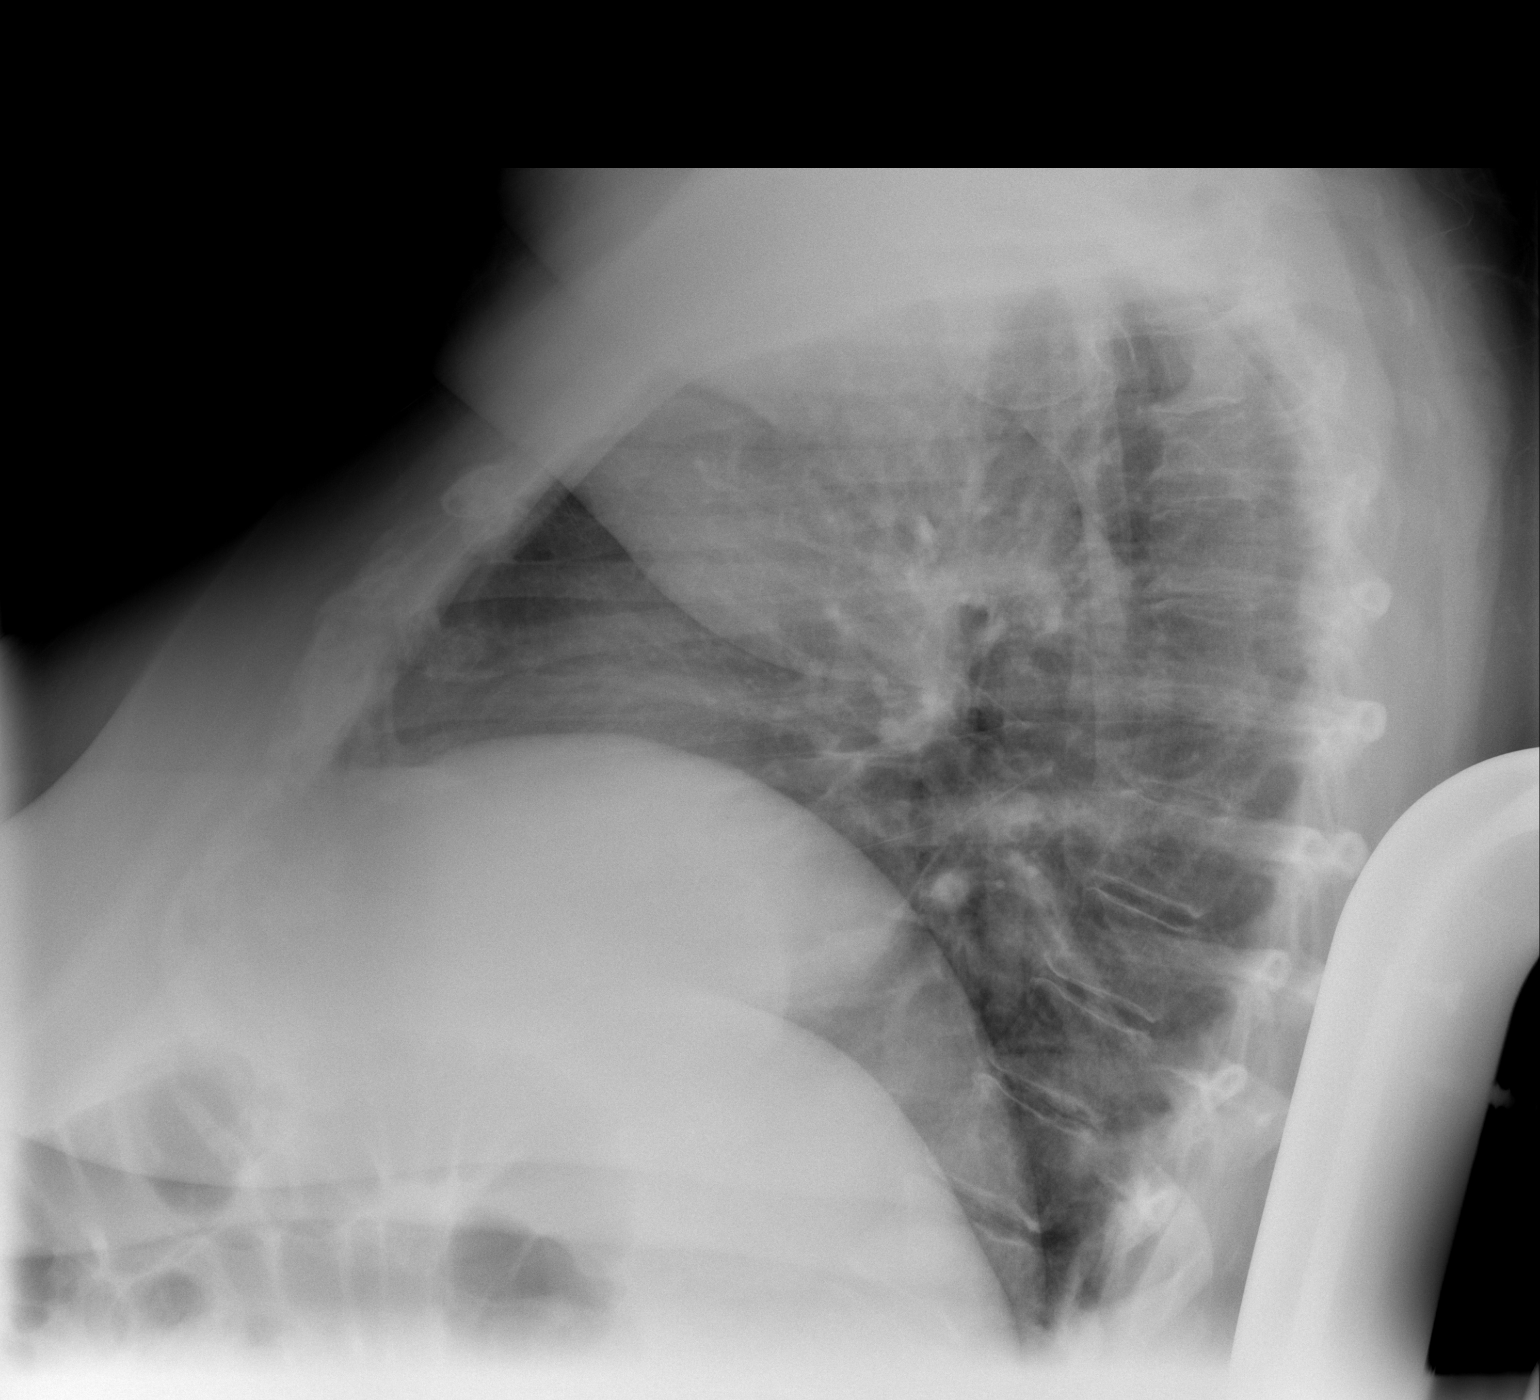

[1 of 1 positions shown; findings below may reference images not displayed]

FINDINGS: Elevation of the right hemidiaphragm is noted.  There is
some discoid atelectatic change in the right mid lung.  Lungs
otherwise appear clear.  No effusion.
IMPRESSION: Right mid lung atelectasis.  Otherwise negative.

## 2010-09-28 ENCOUNTER — Other Ambulatory Visit: Payer: Self-pay | Admitting: Internal Medicine

## 2010-10-01 ENCOUNTER — Other Ambulatory Visit: Payer: Self-pay | Admitting: Internal Medicine

## 2010-11-02 LAB — STOOL CULTURE

## 2010-11-02 LAB — CLOSTRIDIUM DIFFICILE EIA

## 2010-11-02 LAB — GIARDIA/CRYPTOSPORIDIUM SCREEN(EIA): Giardia Screen - EIA: NEGATIVE

## 2010-11-04 ENCOUNTER — Encounter: Payer: Self-pay | Admitting: Internal Medicine

## 2010-11-05 ENCOUNTER — Ambulatory Visit: Payer: 59 | Admitting: Internal Medicine

## 2010-11-26 ENCOUNTER — Telehealth: Payer: Self-pay | Admitting: Gastroenterology

## 2010-11-26 ENCOUNTER — Other Ambulatory Visit: Payer: Self-pay | Admitting: Internal Medicine

## 2010-11-26 MED ORDER — GLYCOPYRROLATE 2 MG PO TABS: 2.0000 mg | ORAL_TABLET | Freq: Two times a day (BID) | ORAL | Status: DC

## 2010-11-26 NOTE — Telephone Encounter (Signed)
Patient c/o multiple loose stools a day.  She denies any recent travel or antibiotic use.  She has not been taking her Robinul as previously ordered.  She is advised to try a low residue diet and resume Robinul.  She is asked to call back next week if she is not seeing an improvement in her symptoms for an appt.

## 2010-12-30 ENCOUNTER — Other Ambulatory Visit: Payer: Self-pay | Admitting: Internal Medicine

## 2011-01-04 ENCOUNTER — Other Ambulatory Visit: Payer: Self-pay | Admitting: Internal Medicine

## 2011-01-04 DIAGNOSIS — E119 Type 2 diabetes mellitus without complications: Secondary | ICD-10-CM

## 2011-01-05 ENCOUNTER — Other Ambulatory Visit: Payer: 59

## 2011-01-07 ENCOUNTER — Telehealth: Payer: Self-pay | Admitting: *Deleted

## 2011-01-07 NOTE — Telephone Encounter (Signed)
Please schedule her with me or any other physician who can see her 12/21

## 2011-01-07 NOTE — Telephone Encounter (Signed)
Pt c/o having "deep chest cough" and request OV [canceled 12.18.12 appointment].  Attempted to contact Pt to inform that Dr Drue Novel has same day availabilities this afternoon and to call back after 1:00pm as soon as she could to schedule. Please advise if you would like to work her in to your schedule.

## 2011-01-07 NOTE — Telephone Encounter (Signed)
Left message on voicemail for patient to call the office first thing in the morning to schedule appointment with Dr.Paz or Dr.Tabori (Dr.Hopper's schedule is full)

## 2011-01-11 NOTE — Telephone Encounter (Signed)
Left message on voicemail for patient to follow-up (appointment was never schedule)

## 2011-01-14 NOTE — Telephone Encounter (Signed)
Pt scheduled appt tomorrow 01/15/11 with Alwyn Ren.

## 2011-01-14 NOTE — Telephone Encounter (Signed)
Left message on voicemail informing patient if she is still with symptoms to please give [place] a call to schedule office visit

## 2011-01-15 ENCOUNTER — Ambulatory Visit (INDEPENDENT_AMBULATORY_CARE_PROVIDER_SITE_OTHER): Payer: 59 | Admitting: Internal Medicine

## 2011-01-15 ENCOUNTER — Encounter: Payer: Self-pay | Admitting: Internal Medicine

## 2011-01-15 VITALS — BP 142/88 | HR 91 | Temp 98.9°F | Resp 18 | Wt 305.5 lb

## 2011-01-15 DIAGNOSIS — J209 Acute bronchitis, unspecified: Secondary | ICD-10-CM

## 2011-01-15 MED ORDER — HYDROCODONE-HOMATROPINE 5-1.5 MG/5ML PO SYRP: 5.0000 mL | ORAL_SOLUTION | Freq: Four times a day (QID) | ORAL | Status: AC | PRN

## 2011-01-15 MED ORDER — AZITHROMYCIN 250 MG PO TABS: ORAL_TABLET | ORAL | Status: DC

## 2011-01-15 MED ORDER — AZITHROMYCIN 250 MG PO TABS: ORAL_TABLET | ORAL | Status: AC

## 2011-01-15 NOTE — Progress Notes (Signed)
  Subjective:    Patient ID: Alexis Grant, female    DOB: [birth year], 60 y.o.   MRN: [id number]  HPI Respiratory tract infection Onset/symptoms:2 weeks ago as ST Exposures (illness/environmental/extrinsic):no Progression of symptoms:to dry cough &  R ear pain w/o discharge Treatments/response:Tylenol for headache w/o response Present symptoms: Fever/chills/sweats:chills Frontal headache:no Facial pain:no Nasal purulence:no Dental pain:no Lymphadenopathy:no Wheezing/shortness of breath:both Cough/sputum/hemoptysis:NP cough to point of N&V Pleuritic pain:no Past medical history: Seasonal allergies : no/asthma:yes Smoking history:never           Review of Systems     Objective:   Physical Exam General appearance: good nourishment; no acute distress or increased work of breathing is present.  No  lymphadenopathy about the head, neck, or axilla noted.   Eyes: No conjunctival inflammation or lid edema is present.   Ears:  External ear exam shows no significant lesions or deformities.  Otoscopic examination reveals clear canals, tympanic membranes are intact bilaterally without bulging, retraction, inflammation or discharge.  Nose:  External nasal examination shows no deformity or inflammation. Nasal mucosa are dry with dried blood on R without exudates. No septal dislocation .No obstruction to airflow.   Oral exam: Dental hygiene is good; lips and gums are healthy appearing.There is no oropharyngeal erythema or exudate noted but tonsils are large.     Heart:  Normal rate and regular rhythm. S1 and S2 normal without gallop, murmur, click, rub or other extra sounds.   Lungs:Chest clear to auscultation; no wheezes, rhonchi,rales ,or rubs present.No increased work of breathing.  Paroxysmal cough   Extremities:  No cyanosis, edema, or clubbing  noted    Skin: Warm & dry          Assessment & Plan:   #1 bronchitis, protracted period clinically no bronchospasm or  reactive airways component is present this time  #2 right otic pain; clinically no otitis is present. Possible Eustachian tube dysfunction from large tonsils  Plan: See orders recommendations

## 2011-01-15 NOTE — Progress Notes (Signed)
Addended by: Maurice Small on: 01/15/2011 01:27 PM   Modules accepted: Orders

## 2011-01-15 NOTE — Patient Instructions (Signed)
Drink to thirst up to 40 ounces of liquid  daily. Sip liquid with each coughing spell

## 2011-02-01 ENCOUNTER — Other Ambulatory Visit: Payer: Self-pay | Admitting: Internal Medicine

## 2011-02-14 ENCOUNTER — Emergency Department (INDEPENDENT_AMBULATORY_CARE_PROVIDER_SITE_OTHER)
Admission: EM | Admit: 2011-02-14 | Discharge: 2011-02-14 | Disposition: A | Discharge status: Home / Self Care | Payer: PRIVATE HEALTH INSURANCE | Source: Home / Self Care | Attending: Emergency Medicine | Admitting: Emergency Medicine

## 2011-02-14 ENCOUNTER — Emergency Department (INDEPENDENT_AMBULATORY_CARE_PROVIDER_SITE_OTHER): Payer: PRIVATE HEALTH INSURANCE

## 2011-02-14 ENCOUNTER — Encounter (HOSPITAL_COMMUNITY): Payer: Self-pay | Admitting: *Deleted

## 2011-02-14 DIAGNOSIS — J40 Bronchitis, not specified as acute or chronic: Secondary | ICD-10-CM

## 2011-02-14 LAB — POCT RAPID STREP A: Streptococcus, Group A Screen (Direct): NEGATIVE

## 2011-02-14 MED ORDER — BENZONATATE 200 MG PO CAPS: 200.0000 mg | ORAL_CAPSULE | Freq: Three times a day (TID) | ORAL | Status: DC | PRN

## 2011-02-14 MED ORDER — AMOXICILLIN-POT CLAVULANATE 875-125 MG PO TABS: 1.0000 | ORAL_TABLET | Freq: Two times a day (BID) | ORAL | Status: AC

## 2011-02-14 NOTE — ED Provider Notes (Signed)
Chief Complaint  Patient presents with  . Sore Throat  . Cough  . Nasal Congestion  . Generalized Body Aches  . Headache    History of Present Illness:  The patient has had a one-month history of dry cough and over the past week has felt feverish, had ear pressure, sore throat, headache, nasal congestion with greenish, bloody drainage, and generalized body aches. She was seen here January 7 and given a Z-Pak. She felt a little bit better for a while but then her symptoms seemed to come back again.  Review of Systems:  Other than noted above, the patient denies any of the following symptoms. Systemic:  No fever, chills, sweats, fatigue, myalgias, headache, or anorexia. Eye:  No redness, pain or drainage. ENT:  No earache, nasal congestion, rhinorrhea, sinus pressure, or sore throat. Lungs:  No cough, sputum production, wheezing, shortness of breath. Or chest pain. GI:  No nausea, vomiting, abdominal pain or diarrhea. Skin:  No rash or itching.  PMFSH:  Past medical history, family history, social history, meds, and allergies were reviewed.  Physical Exam:   Vital signs:  BP 152/86  Pulse 100  Temp(Src) 100.2 F (37.9 C) (Oral)  Resp 18  SpO2 100% General:  Alert, in no distress. Eye:  No conjunctival injection or drainage. ENT:  TMs and canals were normal, without erythema or inflammation.  Nasal mucosa was clear and uncongested, without drainage.  Mucous membranes were moist.  Pharynx was clear, without exudate or drainage.  There were no oral ulcerations or lesions. Neck:  Supple, no adenopathy, tenderness or mass. Lungs:  No respiratory distress.  Lungs were clear to auscultation, without wheezes, rales or rhonchi.  Breath sounds were clear and equal bilaterally. Heart:  Regular rhythm, without gallops, murmers or rubs. Skin:  Clear, warm, and dry, without rash or lesions.  Labs:   Results for orders placed during the hospital encounter of 02/14/11  POCT RAPID STREP A ([facility]  CARE ONLY)      Component Value Range   Streptococcus, Group A Screen (Direct) NEGATIVE  NEGATIVE      Radiology:  Dg Chest 2 View  02/14/2011  *RADIOLOGY REPORT*  Clinical Data: Cough and fever.  Congestion  CHEST - 2 VIEW  Comparison: 11/10/2008  Findings: Heart size appears normal.  No pleural effusion or pulmonary edema identified.  There is no airspace consolidation identified.  IMPRESSION:  1.  No active cardiopulmonary abnormalities.  Original Report Authenticated By: Rosealee Albee, M.D.    Medications given in U[facility]:  None  Assessment:   Diagnoses that have been ruled out:  None  Diagnoses that are still under consideration:  None  Final diagnoses:  Bronchitis      Plan:   1.  The following meds were prescribed:   New Prescriptions   AMOXICILLIN-CLAVULANATE (AUGMENTIN) 875-125 MG PER TABLET    Take 1 tablet by mouth 2 (two) times daily.   BENZONATATE (TESSALON) 200 MG CAPSULE    Take 1 capsule (200 mg total) by mouth 3 (three) times daily as needed for cough.   2.  The patient was instructed in symptomatic care and handouts were given. 3.  The patient was told to return if becoming worse in any way, if no better in 3 or 4 days, and given some red flag symptoms that would indicate earlier return.   Roque Lias, MD 02/14/11 1739

## 2011-02-14 NOTE — ED Notes (Signed)
Pt with c/o sore throat/bilateral ear pain/headache/cough/sinus congestion/facial soreness/aching all over onset x approx one month ago - seen by own physician approx 3 weeks ago z-pack given mild improvement after completeing z pack - worse x one - two weeks -

## 2011-02-18 ENCOUNTER — Other Ambulatory Visit: Payer: Self-pay | Admitting: Internal Medicine

## 2011-02-18 DIAGNOSIS — Z1231 Encounter for screening mammogram for malignant neoplasm of breast: Secondary | ICD-10-CM

## 2011-02-22 ENCOUNTER — Ambulatory Visit: Payer: PRIVATE HEALTH INSURANCE | Admitting: Internal Medicine

## 2011-02-22 ENCOUNTER — Encounter: Payer: Self-pay | Admitting: Family Medicine

## 2011-02-22 ENCOUNTER — Ambulatory Visit (INDEPENDENT_AMBULATORY_CARE_PROVIDER_SITE_OTHER): Payer: PRIVATE HEALTH INSURANCE | Admitting: Family Medicine

## 2011-02-22 VITALS — BP 115/75 | HR 89 | Temp 98.5°F | Ht 63.5 in | Wt 306.0 lb

## 2011-02-22 DIAGNOSIS — B373 Candidiasis of vulva and vagina: Secondary | ICD-10-CM

## 2011-02-22 DIAGNOSIS — R300 Dysuria: Secondary | ICD-10-CM

## 2011-02-22 DIAGNOSIS — R350 Frequency of micturition: Secondary | ICD-10-CM

## 2011-02-22 LAB — POCT URINALYSIS DIPSTICK
Leukocytes, UA: NEGATIVE
Nitrite, UA: NEGATIVE
pH, UA: 6

## 2011-02-22 MED ORDER — FLUCONAZOLE 150 MG PO TABS: ORAL_TABLET | ORAL | Status: DC

## 2011-02-22 NOTE — Assessment & Plan Note (Signed)
No evidence of UTI on UA- likely discomfort from urinating over inflamed skin.  No need for abx.  Pt expressed understanding and is in agreement w/ plan.

## 2011-02-22 NOTE — Progress Notes (Signed)
  Subjective:    Patient ID: Alexis Grant, female    DOB: May 27, 1950, 61 y.o.   MRN: [id number]  HPI Possible yeast- currently taking abx for URI.  Scant vaginal d/c but what is present is thick, white, very itchy.  'i've had so many yeast infections'.  Dysuria- feels sxs are when she urinates over the inflamed skin.  + frequency.  Denies urgency.  No hesitancy.  No hematuria.  Denies pelvic pain or back pain.   Review of Systems For ROS see HPI     Objective:   Physical Exam  Vitals reviewed. Constitutional: She appears well-developed and well-nourished. No distress.  Abdominal: Soft. She exhibits no distension. There is no tenderness (no suprapubic or CVA tenderness).  Genitourinary:       Exam deferred at pt's request due to current musculoskeletal back pain          Assessment & Plan:

## 2011-02-22 NOTE — Assessment & Plan Note (Signed)
New.  Pt reports hx of similar.  Often occuring in tandem w/ abx use.  Start diflucan based on pt's description of sxs.  Pt expressed understanding and is in agreement w/ plan.

## 2011-02-22 NOTE — Patient Instructions (Signed)
Your urine looks fine w/ the exception of mild dehydration Increase your fluid intake Take the Diflucan today- you can repeat in 3 days if symptoms persist Call with any questions or concerns Hang in there!

## 2011-02-27 ENCOUNTER — Other Ambulatory Visit: Payer: Self-pay | Admitting: Gastroenterology

## 2011-03-01 NOTE — Telephone Encounter (Signed)
NEEDS OFFICE VISIT FOR ANY FURTHER REFILLS! 

## 2011-03-29 ENCOUNTER — Ambulatory Visit
Admission: RE | Admit: 2011-03-29 | Discharge: 2011-03-29 | Disposition: A | Discharge status: Home / Self Care | Payer: PRIVATE HEALTH INSURANCE | Source: Ambulatory Visit | Attending: Internal Medicine | Admitting: Internal Medicine

## 2011-03-29 DIAGNOSIS — Z1231 Encounter for screening mammogram for malignant neoplasm of breast: Secondary | ICD-10-CM

## 2011-04-05 ENCOUNTER — Telehealth: Payer: Self-pay | Admitting: Internal Medicine

## 2011-04-05 NOTE — Telephone Encounter (Signed)
Hepatic function Profile 995.20

## 2011-04-05 NOTE — Telephone Encounter (Signed)
Patient states she needs a liver function test? She is coming in for A1C on Friday and would like to have the liver function done at the same time.

## 2011-04-06 NOTE — Telephone Encounter (Signed)
Thank you :)

## 2011-04-09 ENCOUNTER — Other Ambulatory Visit (INDEPENDENT_AMBULATORY_CARE_PROVIDER_SITE_OTHER): Payer: PRIVATE HEALTH INSURANCE

## 2011-04-09 DIAGNOSIS — Z0389 Encounter for observation for other suspected diseases and conditions ruled out: Secondary | ICD-10-CM

## 2011-04-09 DIAGNOSIS — T887XXA Unspecified adverse effect of drug or medicament, initial encounter: Secondary | ICD-10-CM

## 2011-04-09 DIAGNOSIS — Z79899 Other long term (current) drug therapy: Secondary | ICD-10-CM

## 2011-04-09 DIAGNOSIS — E119 Type 2 diabetes mellitus without complications: Secondary | ICD-10-CM

## 2011-04-09 DIAGNOSIS — R5381 Other malaise: Secondary | ICD-10-CM

## 2011-04-09 LAB — HEPATIC FUNCTION PANEL
Albumin: 3.8 g/dL (ref 3.5–5.2)
Bilirubin, Direct: 0.1 mg/dL (ref 0.0–0.3)
Total Protein: 6.9 g/dL (ref 6.0–8.3)

## 2011-04-09 NOTE — Progress Notes (Unsigned)
Pt brought in paper work stating she needed a Cmp ,Cbc with Diff and Drug Screen Urine  lab work that needed to be collected for [facility].. This is why extra lab work is ordered..                                                                                                                                                                                                                      Thanks Zenon Mayo

## 2011-04-10 LAB — COMPREHENSIVE METABOLIC PANEL
ALT: 14 U/L (ref 0–35)
AST: 16 U/L (ref 0–37)
Albumin: 4 g/dL (ref 3.5–5.2)
Calcium: 8.9 mg/dL (ref 8.4–10.5)
Chloride: 104 mEq/L (ref 96–112)
Potassium: 4.3 mEq/L (ref 3.5–5.3)

## 2011-04-10 LAB — CBC WITH DIFFERENTIAL/PLATELET
Basophils Absolute: 0 10*3/uL (ref 0.0–0.1)
Basophils Relative: 0 % (ref 0–1)
MCHC: 30.8 g/dL (ref 30.0–36.0)
Neutro Abs: 4 10*3/uL (ref 1.7–7.7)
Neutrophils Relative %: 53 % (ref 43–77)
RDW: 17.1 % — ABNORMAL HIGH (ref 11.5–15.5)

## 2011-04-10 LAB — DRUG SCREEN, URINE
Benzodiazepines.: NEGATIVE
Creatinine,U: 129.59 mg/dL
Opiates: NEGATIVE
Phencyclidine (PCP): NEGATIVE
Propoxyphene: NEGATIVE

## 2011-04-22 ENCOUNTER — Telehealth: Payer: Self-pay | Admitting: Internal Medicine

## 2011-04-22 NOTE — Telephone Encounter (Signed)
Discussed with pt

## 2011-04-22 NOTE — Telephone Encounter (Signed)
Patient called & wanted lab results. I did advise they were mailed on 3.28.13, but she would still like for someone to call her to go over them with her. Patient work # [id number]

## 2011-05-02 ENCOUNTER — Other Ambulatory Visit: Payer: Self-pay | Admitting: Internal Medicine

## 2011-05-06 ENCOUNTER — Ambulatory Visit (INDEPENDENT_AMBULATORY_CARE_PROVIDER_SITE_OTHER): Payer: PRIVATE HEALTH INSURANCE | Admitting: Internal Medicine

## 2011-05-06 ENCOUNTER — Encounter: Payer: Self-pay | Admitting: Internal Medicine

## 2011-05-06 VITALS — BP 126/84 | HR 83 | Wt 303.0 lb

## 2011-05-06 DIAGNOSIS — E119 Type 2 diabetes mellitus without complications: Secondary | ICD-10-CM

## 2011-05-06 MED ORDER — SIMVASTATIN 20 MG PO TABS: 20.0000 mg | ORAL_TABLET | Freq: Every day | ORAL | Status: DC

## 2011-05-06 MED ORDER — SITAGLIPTIN PHOS-METFORMIN HCL 50-500 MG PO TABS: 1.0000 | ORAL_TABLET | Freq: Two times a day (BID) | ORAL | Status: DC

## 2011-05-06 MED ORDER — ALBUTEROL SULFATE HFA 108 (90 BASE) MCG/ACT IN AERS: 2.0000 | INHALATION_SPRAY | Freq: Four times a day (QID) | RESPIRATORY_TRACT | Status: DC | PRN

## 2011-05-06 MED ORDER — FLUTICASONE PROPIONATE 50 MCG/ACT NA SUSP: NASAL | Status: DC

## 2011-05-06 MED ORDER — BUDESONIDE-FORMOTEROL FUMARATE 160-4.5 MCG/ACT IN AERO: 1.0000 | INHALATION_SPRAY | Freq: Two times a day (BID) | RESPIRATORY_TRACT | Status: DC

## 2011-05-06 MED ORDER — LEVOTHYROXINE SODIUM 25 MCG PO TABS: ORAL_TABLET | ORAL | Status: DC

## 2011-05-06 NOTE — Progress Notes (Signed)
Subjective:    Patient ID: Alexis Grant, female    DOB: 1950/08/10, 61 y.o.   MRN: [id number]  HPI Diabetes status assessment: Fasting or morning glucose  average :  100  . Highest glucose 2 hours after any meal:  < 135. Hypoglycemia :  2 x /week around 10-11 am; she skips breakfast .                                                     Excess thirst :yes;  Excess hunger:  no ;  Excess urination:  no.                                  Lightheadedness with standing: frequently. Chest pain:  no ; Palpitations :no ;  Pain in  calves with walking:  no .                                                                                                                                 Non healing skin  ulcers or sores,especially over the feet: no. Numbness or tingling or burning in feet : no .                                                                                                                                              Significant change in  Weight : stable. Vision changes : blurred due to cataract  .                                                                    Exercise : walks @ work. Nutrition/diet:  No plan but low sugar. Medication compliance : yes.  Eye exam : 05/05/11 ; no retinopathy. Foot care : no.  A1c/ urine microalbumin monitor:  6.2%      Review of Systems  Objective:   Physical Exam She appears  well-nourished; she is in no acute distress  No carotid bruits are present.  Heart rhythm and rate are normal with no significant murmurs or gallops. S 4  Chest is clear with no increased work of breathing  There is no evidence of aortic aneurysm or renal artery bruits  She has no clubbing or edema.   Pedal pulses are intact  But slightly decreased  No ischemic skin changes are present   Light touch is normal the feet. Nail health is good        Assessment & Plan:

## 2011-05-06 NOTE — Patient Instructions (Addendum)
Preventive Health Care: Exercise  30-45  minutes a day, 3-4 days a week. Walking is especially valuable in preventing Osteoporosis. Eat a low-fat diet with lots of fruits and vegetables, up to 7-9 servings per day. Consume less than 30 (preferably ZERO) grams of sugar per day from foods & drinks with High Fructose Corn Syrup (HFCS) sugar as #1,2,3 or # 4 on label.Whole Foods, [employer] & [employer] do not carry products with HFCS. Follow a  low carb nutrition program such as [place] or The New Sugar Busters  to prevent Diabetes progression . White carbohydrates (potatoes, rice, bread, and pasta) have a high spike of sugar and a high load of sugar. For example a  baked potato has a cup of sugar and a  french fry  2 teaspoons of sugar. Yams, wild  rice, whole grained bread &  wheat pasta have been much lower spike and load of  sugar. Portions should be the size of a deck of cards or your palm.  Please  schedule fasting Labs in 6 months: Lipids, A1c , vitamin D level, TSH. PLEASE BRING THESE INSTRUCTIONS TO FOLLOW UP  LAB APPOINTMENT.This will guarantee correct labs are drawn, eliminating need for repeat blood sampling ( needle sticks ! ). Diagnoses /Codes: 250.00, vit D deficiency, 244.9

## 2011-05-06 NOTE — Assessment & Plan Note (Signed)
Diabetes control is excessive as documented by hypoglycemic episodes several times a week. The sulfonylurea will be discontinued. Focus will be on nutrition and exercise. A1c should be rechecked in 6 months with lipids

## 2011-08-23 ENCOUNTER — Encounter: Payer: Self-pay | Admitting: Internal Medicine

## 2011-08-23 ENCOUNTER — Ambulatory Visit (INDEPENDENT_AMBULATORY_CARE_PROVIDER_SITE_OTHER): Payer: PRIVATE HEALTH INSURANCE | Admitting: Internal Medicine

## 2011-08-23 VITALS — BP 118/76 | HR 87 | Temp 97.9°F | Wt 310.0 lb

## 2011-08-23 DIAGNOSIS — R197 Diarrhea, unspecified: Secondary | ICD-10-CM

## 2011-08-23 DIAGNOSIS — M79609 Pain in unspecified limb: Secondary | ICD-10-CM

## 2011-08-23 DIAGNOSIS — M79672 Pain in left foot: Secondary | ICD-10-CM

## 2011-08-23 MED ORDER — DIPHENOXYLATE-ATROPINE 2.5-0.025 MG PO TABS: 1.0000 | ORAL_TABLET | Freq: Four times a day (QID) | ORAL | Status: AC | PRN

## 2011-08-23 MED ORDER — METRONIDAZOLE 500 MG PO TABS: 500.0000 mg | ORAL_TABLET | Freq: Three times a day (TID) | ORAL | Status: AC

## 2011-08-23 MED ORDER — CIPROFLOXACIN HCL 500 MG PO TABS: 500.0000 mg | ORAL_TABLET | Freq: Two times a day (BID) | ORAL | Status: AC

## 2011-08-23 NOTE — Progress Notes (Signed)
Subjective:    Patient ID: Alexis Grant, female    DOB: [birth year], 61 y.o.   MRN: [id number]  HPI She began to have frank diarrhea acutely 8/1 without prodrome. If that day she had approximately 10 watery bowel movements. This has persisted at this rate over the next several days despite Imodium right ear. Today she's only had 3 watery stools. There has been no travel exposure or exposure to undercooked or suspect foods. She is not had any antibiotics recently. Her water source is city. Her past history is positive for adenomatous polyps. She's also had a cholecystectomy. There is family history of colon polyps as well.  She has experienced nausea without vomiting. She's describes mid abdominal cramping pain with the diarrhea. There's been no associated weight loss or decreased urine output. She has some postural lightheadedness which is unrelated to the diarrhea. She has not had fever, chills, or sweats. She also denies melena or rectal bleeding.    Review of Systems Since 8/2 she's had swelling, redness and tenderness over the anterior aspect of the left ankle and entire dorsal foot. There was no injury or trigger for this. Ice was of no benefit. Flexion aggravates his symptoms. She's also noted some numbness and tingling in the left calf. There's been no lymphadenopathy , abnormal bruising or bleeding.         Objective:   Physical Exam Gen.:  well-nourished in appearance. Alert, appropriate and cooperative throughout exam. Eyes: No corneal or conjunctival inflammation noted.  Mouth: Oral mucosa and oropharynx reveal no lesions or exudates. Teeth in good repair. Neck: No deformities, masses, or tenderness noted.  Thyroid normal. Lungs: Normal respiratory effort; chest expands symmetrically. Lungs are clear to auscultation without rales, wheezes, or increased work of breathing. Heart: Normal rate and rhythm. Normal S1 and S2. No gallop, click, or rub. S4 w/o murmur. Abdomen: Bowel  sounds slightly decreased ; abdomen soft and nontender. No masses, organomegaly or hernias noted.                                                                                 Musculoskeletal/extremities:  Edema is present at the left ankle and foot. She has pain with flexion or rotation of the ankle. There's also pain to compression or palpation of the dorsum of the foot. Homans sign is negative. There is erythema of the area the swelling. I cannot appreciate temperature differential between the 2 ankles. Nail health  good. Vascular: Carotid, radial artery, dorsalis pedis and  posterior tibial pulses are full and equal. No bruits present. Neurologic: Alert and oriented x3.        Skin: Intact without suspicious lesions or rashes. Lymph: No cervical, axillary lymphadenopathy present. Psych: Mood and affect are normal. Normally interactive  Assessment & Plan:   #1 diarrhea, presumed infectious  #2 pain and swelling left foot. Rule out small bone fracture versus gout.  Plan: See orders and recommendations

## 2011-08-23 NOTE — Patient Instructions (Addendum)
Stay on clear liquids for 48-72 hours or until bowels are normal.This would include  jello, sherbert (NOT ice cream), Lipton's chicken noodle soup(NOT cream based soups),Gatorade Lite, flat Ginger ale (without High Fructose Corn Syrup),dry toast or crackers, baked potato.No milk , dairy or grease until bowels are formed. Align , a [employer] , daily if stools are loose. Lomotil  for frankly watery stool. Report increasing pain, fever or rectal bleeding . Order for x-rays entered into  the computer; these will be performed at [address] [facility]. across from [facility]. No appointment is necessary. Please try to go on My Chart within the next 24 hours to allow me to release the results directly to you.

## 2011-08-24 LAB — CBC WITH DIFFERENTIAL/PLATELET
Basophils Absolute: 0 10*3/uL (ref 0.0–0.1)
Hemoglobin: 13.4 g/dL (ref 12.0–15.0)
Lymphocytes Relative: 41.1 % (ref 12.0–46.0)
Monocytes Relative: 8 % (ref 3.0–12.0)
Neutro Abs: 3.4 10*3/uL (ref 1.4–7.7)
Neutrophils Relative %: 48.3 % (ref 43.0–77.0)
RBC: 4.78 Mil/uL (ref 3.87–5.11)
RDW: 16.9 % — ABNORMAL HIGH (ref 11.5–14.6)

## 2011-08-24 LAB — BASIC METABOLIC PANEL
CO2: 26 mEq/L (ref 19–32)
Chloride: 98 mEq/L (ref 96–112)
Creatinine, Ser: 0.7 mg/dL (ref 0.4–1.2)
Potassium: 4.3 mEq/L (ref 3.5–5.1)

## 2011-08-27 ENCOUNTER — Other Ambulatory Visit: Payer: Self-pay | Admitting: Orthopaedic Surgery

## 2011-08-27 ENCOUNTER — Ambulatory Visit
Admission: RE | Admit: 2011-08-27 | Discharge: 2011-08-27 | Disposition: A | Discharge status: Home / Self Care | Payer: PRIVATE HEALTH INSURANCE | Source: Ambulatory Visit | Attending: Orthopaedic Surgery | Admitting: Orthopaedic Surgery

## 2011-08-27 DIAGNOSIS — M79669 Pain in unspecified lower leg: Secondary | ICD-10-CM

## 2011-09-15 ENCOUNTER — Encounter: Payer: Self-pay | Admitting: Internal Medicine

## 2011-09-15 ENCOUNTER — Ambulatory Visit (INDEPENDENT_AMBULATORY_CARE_PROVIDER_SITE_OTHER): Payer: PRIVATE HEALTH INSURANCE | Admitting: Internal Medicine

## 2011-09-15 VITALS — BP 122/84 | HR 74 | Temp 98.5°F | Wt 307.2 lb

## 2011-09-15 DIAGNOSIS — R609 Edema, unspecified: Secondary | ICD-10-CM

## 2011-09-15 DIAGNOSIS — M79672 Pain in left foot: Secondary | ICD-10-CM

## 2011-09-15 DIAGNOSIS — M79609 Pain in unspecified limb: Secondary | ICD-10-CM

## 2011-09-15 DIAGNOSIS — R197 Diarrhea, unspecified: Secondary | ICD-10-CM

## 2011-09-15 NOTE — Progress Notes (Signed)
  Subjective:    Patient ID: Alexis Grant, female    DOB: 22-Nov-1950, 61 y.o.   MRN: [id number]  HPI  History as recorded 8/5 unchanged; major issue is swelling with redness  of L foot & pain with extension of L ankle. Uric acid 3.6 ; plain films, MRI  & Venous Doppler by Dr Cleophas Dunker  all negative except for tendonitis.    Review of Systems  Constitutional: no fever, chills, sweats, change in weight  Musculoskeletal:no  muscle cramps or pain Skin:no rash Neuro: no  weakness; incontinence (stool/urine); numbness and tingling Heme:no lymphadenopathy; abnormal bruising or bleeding     Every BM is watery for 4-6 weeks despite Immodium AD  & Lomotil She has 5-6 BMs / day.Last colonoscopy 2011: polyps resected.     Objective:   Physical Exam Gen.:  well-nourished in appearance. Alert, appropriate and cooperative throughout exam.In no distress Neck: no NVD @ 10 degrees Lungs: Normal respiratory effort; chest expands symmetrically. Lungs are clear to auscultation without rales, wheezes, or increased work of breathing. Heart: Normal rate and rhythm. Normal S1 and S2. No gallop, click, or rub.S4 w/o  murmur. Abdomen: Bowel sounds normal; abdomen soft and nontender. No masses, organomegaly or hernias noted.No HJR                                                                           Musculoskeletal/extremities: No clubbing, cyanosis or deformity noted. Range of motion  normal .Tone & strength  normal. Nail health  Good.L foot swollen & tender to compression Vascular:  dorsalis pedis and  posterior tibial pulses are full and equal.  Neurologic: Alert and oriented x3. Deep tendon reflexes symmetrical and normal.          Skin: Intact without suspicious lesions or rashes.L foot slightly erythematous w/o increased temp Lymph: No cervical, axillary lymphadenopathy present. Psych: Mood and affect are normal. Normally interactive                                                                                          Assessment & Plan:  #1 foot pain & swelling; ? Etiology, R/O occult fracture vs RSD. I question whether limited bone scan of value. #2 diarrhea. See stool studies; GI referral if negative

## 2011-09-15 NOTE — Patient Instructions (Signed)
Please take the probiotic , Align, every day until the bowels are normal. This will replace the normal bacteria which  are necessary for formation of normal stool and processing of food. 

## 2011-09-23 ENCOUNTER — Encounter (HOSPITAL_COMMUNITY)
Admission: RE | Admit: 2011-09-23 | Discharge: 2011-09-23 | Disposition: A | Discharge status: Home / Self Care | Payer: PRIVATE HEALTH INSURANCE | Source: Ambulatory Visit | Attending: Internal Medicine | Admitting: Internal Medicine

## 2011-09-23 ENCOUNTER — Ambulatory Visit (HOSPITAL_COMMUNITY)
Admission: RE | Admit: 2011-09-23 | Discharge: 2011-09-23 | Disposition: A | Discharge status: Home / Self Care | Payer: PRIVATE HEALTH INSURANCE | Source: Ambulatory Visit | Attending: Internal Medicine | Admitting: Internal Medicine

## 2011-09-23 DIAGNOSIS — M7989 Other specified soft tissue disorders: Secondary | ICD-10-CM | POA: Insufficient documentation

## 2011-09-23 DIAGNOSIS — M79672 Pain in left foot: Secondary | ICD-10-CM

## 2011-09-23 DIAGNOSIS — M79609 Pain in unspecified limb: Secondary | ICD-10-CM | POA: Insufficient documentation

## 2011-09-23 LAB — OVA AND PARASITE SCREEN: OP: NONE SEEN

## 2011-09-23 MED ORDER — TECHNETIUM TC 99M MEDRONATE IV KIT
25.0000 | PACK | Freq: Once | INTRAVENOUS | Status: AC | PRN
  Administered 2011-09-23: 25 via INTRAVENOUS

## 2011-09-25 LAB — CLOSTRIDIUM DIFFICILE EIA: CDIFTX: NEGATIVE

## 2011-09-26 LAB — STOOL CULTURE

## 2011-10-15 ENCOUNTER — Telehealth: Payer: Self-pay | Admitting: Internal Medicine

## 2011-10-15 MED ORDER — DILTIAZEM HCL ER COATED BEADS 240 MG PO TB24: 240.0000 mg | ORAL_TABLET | Freq: Every day | ORAL | Status: DC

## 2011-10-15 NOTE — Telephone Encounter (Signed)
Patient states Dr. Herbie Baltimore prescribes her diltiazem, but she does not want to go to him any longer. She would like to see if Dr. Alwyn Ren will prescribe this for her. She requests a 90 day supply [facility] into [facility] [address]. Call pt back at work.

## 2011-10-15 NOTE — Telephone Encounter (Signed)
Dr.Hopper please advise 

## 2011-10-15 NOTE — Telephone Encounter (Signed)
Rx sent.    MW 

## 2011-10-15 NOTE — Telephone Encounter (Signed)
OK # 90 

## 2011-10-20 ENCOUNTER — Telehealth: Payer: Self-pay

## 2011-10-20 ENCOUNTER — Telehealth: Payer: Self-pay | Admitting: Internal Medicine

## 2011-10-20 DIAGNOSIS — E785 Hyperlipidemia, unspecified: Secondary | ICD-10-CM

## 2011-10-20 DIAGNOSIS — T887XXA Unspecified adverse effect of drug or medicament, initial encounter: Secondary | ICD-10-CM

## 2011-10-20 DIAGNOSIS — I10 Essential (primary) hypertension: Secondary | ICD-10-CM

## 2011-10-20 NOTE — Telephone Encounter (Signed)
Dr.Hopper please advise:   Warning: Plasma concentrations and pharmacologic effects of simvastatin may be increased by co-administration of diltiazem. The risk of myopathy and rhabdomyolysis may be increased. Specific maximum dose recommendations exist for simvastatin and lovastatin when coadministered with diltiazem in official package labeling.

## 2011-10-20 NOTE — Telephone Encounter (Signed)
Caller: Marcelino Duster RPH/Patient; Patient Name: Alexis Grant; PCP: Marga Melnick; Best Callback Phone Number: [phone].   Marcelino Duster [facility] calling to inform about a level 1 interaction with script Diltiazem and Simvastatin and to clear it before she fills/gives the medications to the pt.    PLEASE FOLLOW UP WITH PHARMACY IN REGARD TO SCRIPT.  Thank you.

## 2011-10-20 NOTE — Telephone Encounter (Signed)
Pt returning call Scheduled pt for labs and f/u appt for labs.   MW

## 2011-10-20 NOTE — Telephone Encounter (Signed)
Calcium channel blocker blood pressure  /cardiac medications such as   diltiazem IN COMBINATION with  Statin cholesterol lowering medications such as simvastatin  could possibly increase risk of muscle insult. As she is on this combination ; fasting labs would be necessary to verify absence of risk before continuing these together: CK,Lipids, hepatic panel. Codes:995.20,401.9,272.4. An office visit in 3-5 days after the fasting labs are drawn would be needed prior to refilling the meds.

## 2011-10-20 NOTE — Telephone Encounter (Signed)
Left message on voicemail with Dr.Hopper's response. Patient to call back and schedule appointment 1.) Fasting lab appointment 2.) F/U appointment 3-5 days later   **Future orders placed

## 2011-10-21 ENCOUNTER — Other Ambulatory Visit: Payer: PRIVATE HEALTH INSURANCE

## 2011-10-22 ENCOUNTER — Other Ambulatory Visit (INDEPENDENT_AMBULATORY_CARE_PROVIDER_SITE_OTHER): Payer: PRIVATE HEALTH INSURANCE

## 2011-10-22 DIAGNOSIS — E119 Type 2 diabetes mellitus without complications: Secondary | ICD-10-CM

## 2011-10-22 DIAGNOSIS — I10 Essential (primary) hypertension: Secondary | ICD-10-CM

## 2011-10-22 DIAGNOSIS — E785 Hyperlipidemia, unspecified: Secondary | ICD-10-CM

## 2011-10-22 DIAGNOSIS — T887XXA Unspecified adverse effect of drug or medicament, initial encounter: Secondary | ICD-10-CM

## 2011-10-22 LAB — HEMOGLOBIN A1C: Hgb A1c MFr Bld: 8.7 % — ABNORMAL HIGH (ref 4.6–6.5)

## 2011-10-22 LAB — HEPATIC FUNCTION PANEL
ALT: 17 U/L (ref 0–35)
AST: 14 U/L (ref 0–37)
Bilirubin, Direct: 0.1 mg/dL (ref 0.0–0.3)
Total Bilirubin: 0.4 mg/dL (ref 0.3–1.2)

## 2011-10-22 LAB — LIPID PANEL: Cholesterol: 154 mg/dL (ref 0–200)

## 2011-10-25 ENCOUNTER — Other Ambulatory Visit: Payer: Self-pay | Admitting: Internal Medicine

## 2011-10-25 MED ORDER — DIPHENOXYLATE-ATROPINE 2.5-0.025 MG PO TABS: 1.0000 | ORAL_TABLET | Freq: Four times a day (QID) | ORAL | Status: DC | PRN

## 2011-10-25 NOTE — Telephone Encounter (Signed)
refill diphen/atropine tab --take one tablet by mouth 4 times a day as needed for diarrhea or loose stools -- last fill 8.5.13 last ov 8.28.13 acute

## 2011-10-25 NOTE — Telephone Encounter (Signed)
#   10, NR. should be used on a regular basis; if symptoms persist I recommend following up with Dr. Russella Dar to define the etiology

## 2011-10-25 NOTE — Telephone Encounter (Signed)
Dr.Hopper please advise, last ov 09/15/11

## 2011-10-25 NOTE — Telephone Encounter (Signed)
RX sent with notation OV if no better

## 2011-10-26 ENCOUNTER — Encounter: Payer: Self-pay | Admitting: Internal Medicine

## 2011-10-26 ENCOUNTER — Ambulatory Visit (INDEPENDENT_AMBULATORY_CARE_PROVIDER_SITE_OTHER): Payer: PRIVATE HEALTH INSURANCE | Admitting: Internal Medicine

## 2011-10-26 ENCOUNTER — Other Ambulatory Visit: Payer: Self-pay

## 2011-10-26 VITALS — BP 126/80 | HR 101 | Wt 305.8 lb

## 2011-10-26 DIAGNOSIS — E039 Hypothyroidism, unspecified: Secondary | ICD-10-CM

## 2011-10-26 DIAGNOSIS — E785 Hyperlipidemia, unspecified: Secondary | ICD-10-CM

## 2011-10-26 DIAGNOSIS — I10 Essential (primary) hypertension: Secondary | ICD-10-CM

## 2011-10-26 DIAGNOSIS — IMO0001 Reserved for inherently not codable concepts without codable children: Secondary | ICD-10-CM

## 2011-10-26 DIAGNOSIS — R079 Chest pain, unspecified: Secondary | ICD-10-CM

## 2011-10-26 DIAGNOSIS — Z23 Encounter for immunization: Secondary | ICD-10-CM

## 2011-10-26 MED ORDER — INSULIN PEN NEEDLE 32G X 6 MM MISC: Status: DC

## 2011-10-26 MED ORDER — LIRAGLUTIDE 18 MG/3ML ~~LOC~~ SOLN: 0.6000 mg | Freq: Every day | SUBCUTANEOUS | Status: DC

## 2011-10-26 MED ORDER — GLUCOSE BLOOD VI STRP: ORAL_STRIP | Status: DC

## 2011-10-26 NOTE — Telephone Encounter (Signed)
Spoke with pt concerning lancets for one touch. Pt wanted them ordered but i didn't see them available in med and orders I advised [facility]. Pt stated she would ask.       MW

## 2011-10-26 NOTE — Progress Notes (Signed)
Subjective:    Patient ID: Alexis Grant, female    DOB: [birth year], 61 y.o.   MRN: [id number]  HPI The patient is here for followup of diabetes, hyperlipidemia, and hypertension.  The most recent A1c 10/22/11   Was 8.7%   , which correlates to an average sugar of 203 , and long-term risk of 64 % . Fasting blood sugar ranges  135-190; no average .  Highest two-hour postprandial glucose not checked . Hypoglycemia 1-2X / month if no b'fast Last ophthalmologic examination 8/13 revealed no retinopathy. No active podiatry assessment on record. Meal intake irregular  . No exercise   .  The most recent lipids  10/4  reveal LDL 79 , HDL 49.2  , and triglycerides 129  . There is medical compliance with the statin.  Blood pressure  average is  120/70 ,even off CCB. WRIST CUFF employed  . See notes re CCB discontinuation          Review of Systems Constitutional: No fever, chills, significant weight change,  weakness or night sweats. Excess fatigue Eyes: No  blurred vision, double vision, or loss of vision. Cataract excised 8/13 Cardiovascular: Occasional exertional chest pain. Occasional racing; PMH of PAF for which CCB Rxed .No syncopeor claudication. LLE edema  @ night Respiratory: No exertinal dyspnea, paroxysmal nocturnal dyspnea Gastrointestinal: No heartburn or dysphagia on Pepcid.No  constipation, rectal bleeding, melena. Loose stool ; Align helps. Janumet now taken with food Genitourinary: No dysuria,hematuria, pyuria, frequency,  incontinence, nocturia Musculoskeletal: No myalgias or muscle cramping , weakness, or cyanosis. CK was WNL @ 41 Dermatologic: No rash, pruritus, urticaria, or change in color or temperature of skin Neurologic: No  limb weakness,  numbness or tingling Endocrine: Some hair loss.No change in skin/ nails,  excessive hunger,or excessive urination. Polydipsia    Objective:   Physical Exam Gen.:  well-nourished in appearance. Alert, appropriate and  cooperative throughout exam. Eyes: No corneal or conjunctival inflammation noted.  Neck: No deformities, masses, or tenderness noted.  Thyroid normal. Lungs: Normal respiratory effort; chest expands symmetrically. Lungs are clear to auscultation without rales, wheezes, or increased work of breathing. Heart: Normal rate and rhythm. Normal S1 and S2. No gallop, click, or rub.  S4 with slurring at  LSB . Abdomen: Bowel sounds normal; abdomen soft and nontender but protuberant. No masses, organomegaly or hernias                                                                                   Musculoskeletal/extremities: No clubbing, cyanosis, edema, or deformity noted.Joints normal. Nail health  good. Vascular: Carotid & radial artery  are full and equal. No bruits present.Decreased dorsalis pedis and  posterior tibial pulses . Neurologic: Alert and oriented x3. Deep tendon reflexes symmetrical but 0-1/2+ @ knees.  Decreased sensation L foot        Skin: Intact without suspicious lesions or rashes.light plethora L foot Lymph: No cervical, axillary lymphadenopathy present. Psych: Mood and affect are normal. Normally interactive  Assessment & Plan:

## 2011-10-26 NOTE — Assessment & Plan Note (Signed)
Thyroid status should be reassessed; last TSH was 3.03 in mid 2012

## 2011-10-26 NOTE — Patient Instructions (Addendum)
Eat a low-fat diet with lots of fruits and vegetables, up to 7-9 servings per day. Consume less than 30 (preferably ZERO) grams of sugar per day from foods & drinks with High Fructose Corn Syrup (HFCS) sugar as #1,2,3 or # 4 on label.Whole Foods, [employer] & [employer] do not carry products with HFCS. Follow a  low carb nutrition program such as [place] or The New Sugar Busters  to prevent Diabetes progression . White carbohydrates (potatoes, rice, bread, and pasta) have a high spike of sugar and a high load of sugar. For example a  baked potato has a cup of sugar and a  french fry  2 teaspoons of sugar. Yams, wild  rice, whole grained bread &  wheat pasta have been much lower spike and load of  sugar. Portions should be the size of a deck of cards or your palm.  Please  schedule  Labs in 10 weeks : BMET,A1c, urine microalbumin. PLEASE BRING THESE INSTRUCTIONS TO FOLLOW UP  LAB APPOINTMENT.This will guarantee correct labs are drawn, eliminating need for repeat blood sampling ( needle sticks ! ). Diagnoses /Codes: 250.02  If you activate My Chart; the results can be released to you as soon as they populate from the lab. If you choose not to use this program; the labs have to be reviewed, copied & mailed   causing a delay in getting the results to you.

## 2011-10-26 NOTE — Assessment & Plan Note (Signed)
Lipids are essentially at goal on low-dose simvastatin. No med  change indicated. She had been on a calcium channel blocker which she self discontinued. Nutritional intervention will be stressed

## 2011-10-26 NOTE — Assessment & Plan Note (Signed)
Blood pressure appears to be well controlled; but the measurements are on a wrist cuff. The calcium channel blocker and also been prescribed for paroxysmal atrial fibrillation.

## 2011-10-26 NOTE — Assessment & Plan Note (Signed)
She continues to have some exertional chest pain. Her cardiologist, Dr. Elsie Lincoln has retired. She wishes referral to [facility] cardiology

## 2011-11-02 ENCOUNTER — Telehealth: Payer: Self-pay | Admitting: Internal Medicine

## 2011-11-02 NOTE — Telephone Encounter (Signed)
Caller: Nellie/Patient; Patient Name: Alexis Grant; PCP: Duncan Dull (Adults only); Best Callback Phone Number: [phone]; Reason for call: Larey Seat 09/25/11 and injured left  arm.  Arm is swollen and painful.  She does not want to go back to ED.  Went to UC.  10/19/11 saw Dr. Darrick Huntsman and evaluated.  After flu shot she developed hematoma and had one in ED on 10/2 and 11/01/11.  [place] arm 10/22/11, CT of head.  Went to Urgent Care on 10/14 and was sent to ED; she left there at 2300 on 11/01/11. Caller states she was instructed to see Dr. Darrick Huntsman or have her to look at the arm on 11/02/11.  Note to office for possible appointment this date per PCP Calls, No Triage protocol due to patient history of being seen in ED and instruction to see provider on 11/02/11.

## 2011-11-02 NOTE — Telephone Encounter (Signed)
Caller: Derhonda/Patient; Patient Name: Alexis Grant; PCP: Marga Melnick; Best Callback Phone Number: [phone].  Caller reports, "My blood sugar is real high."  Lowest is 175 fasting.  Runs 225-250;  Victosa started on 10/27/11.  Most recent FSBG 225 fasting.  Reports feeling weak, light headed and thirsty; dark urine reported.  Other emergent symptoms ruled out.  See in ED Immediately per Diabetes:  Control Problems protocol due to "signs of dehydration".  No appointments available at this office until 1545 with Dr. Drue Novel; next available with P. Orvan Falconer is also 1545 and none with M. Peggyann Juba.   Note to office for possible work in appointment or direction to ED.  Caller advised to anticipate callback ASAP regarding possible appointment.

## 2011-11-02 NOTE — Telephone Encounter (Signed)
Per Dr.Hopper increase Victozia to 1.2 once daily. I called patient and gave her instructions, patient to continue monitoring BS and to call if still remaining high.

## 2011-11-09 ENCOUNTER — Other Ambulatory Visit: Payer: Self-pay | Admitting: Internal Medicine

## 2011-11-09 ENCOUNTER — Telehealth: Payer: Self-pay | Admitting: Internal Medicine

## 2011-11-09 MED ORDER — LIRAGLUTIDE 18 MG/3ML ~~LOC~~ SOLN: 0.6000 mg | Freq: Every day | SUBCUTANEOUS | Status: DC

## 2011-11-09 MED ORDER — INSULIN PEN NEEDLE 32G X 6 MM MISC: Status: DC

## 2011-11-09 NOTE — Telephone Encounter (Signed)
pt would like to participate in DM mgmt clinic at [facility] & she states she needs a referral  Cb# [phone]

## 2011-11-09 NOTE — Telephone Encounter (Signed)
OK ;Dx 250.02

## 2011-11-09 NOTE — Telephone Encounter (Signed)
Pt called in and left message on triage line request Victoza and insulin needles. I printed the Rx to take for Hopper to sign. Pt has ahd recent OV and labs.      MW

## 2011-11-09 NOTE — Telephone Encounter (Signed)
Per my work que and patient's referrals, there was no nutrition/diabetic referral placed.

## 2011-11-09 NOTE — Telephone Encounter (Signed)
Dr.Hopper please advise and enter referral

## 2011-11-12 ENCOUNTER — Other Ambulatory Visit: Payer: Self-pay | Admitting: Internal Medicine

## 2011-11-12 NOTE — Telephone Encounter (Signed)
Refill done.  

## 2011-11-24 ENCOUNTER — Telehealth: Payer: Self-pay | Admitting: Internal Medicine

## 2011-11-24 NOTE — Telephone Encounter (Signed)
Noted, per Dr.Hopper's protocol if patient sent to ER or Urgent care ok to close encounter

## 2011-11-24 NOTE — Telephone Encounter (Signed)
Patient calling, has had a cold, sore throat and ear pain since Monday 11/4.  Continues to have sx with hoarseness.  FBS 134mg  this am.  Having some shortness of breath.  The cough is nonproductive but very congested sounding.   Needs to be seen immediately.  States that she cannot make it to the office this afternoon.  Sent to UC to be seen.

## 2011-11-25 ENCOUNTER — Emergency Department (INDEPENDENT_AMBULATORY_CARE_PROVIDER_SITE_OTHER)
Admission: EM | Admit: 2011-11-25 | Discharge: 2011-11-25 | Disposition: A | Discharge status: Home / Self Care | Payer: PRIVATE HEALTH INSURANCE | Source: Home / Self Care | Attending: Emergency Medicine | Admitting: Emergency Medicine

## 2011-11-25 ENCOUNTER — Encounter (HOSPITAL_COMMUNITY): Payer: Self-pay | Admitting: *Deleted

## 2011-11-25 DIAGNOSIS — J069 Acute upper respiratory infection, unspecified: Secondary | ICD-10-CM

## 2011-11-25 DIAGNOSIS — J029 Acute pharyngitis, unspecified: Secondary | ICD-10-CM

## 2011-11-25 DIAGNOSIS — J40 Bronchitis, not specified as acute or chronic: Secondary | ICD-10-CM

## 2011-11-25 MED ORDER — PREDNISONE 20 MG PO TABS: 40.0000 mg | ORAL_TABLET | Freq: Every day | ORAL | Status: AC

## 2011-11-25 MED ORDER — DOXYCYCLINE HYCLATE 100 MG PO CAPS: 100.0000 mg | ORAL_CAPSULE | Freq: Two times a day (BID) | ORAL | Status: DC

## 2011-11-25 MED ORDER — GUAIFENESIN-CODEINE 100-10 MG/5ML PO SYRP: 5.0000 mL | ORAL_SOLUTION | Freq: Three times a day (TID) | ORAL | Status: DC | PRN

## 2011-11-25 NOTE — ED Notes (Signed)
Pt       Reports  Symptoms  Of  Cough       Congested       sorethroat    As  Well   As        Bilateral  Earache      X  4  Days  - p[t  Reports  The  Cough is  For the  Most  part  Non  Productive          He  Is  Awake  As  Well  As  Alert and  Oriented

## 2011-11-25 NOTE — ED Provider Notes (Signed)
History     CSN: [id number]  Arrival date & time 11/25/11  4010   First MD Initiated Contact with Patient 11/25/11 0830      Chief Complaint  Patient presents with  . Cough    (Consider location/radiation/quality/duration/timing/severity/associated sxs/prior treatment) HPI Comments: Patient presents to urgent care this morning as recommended by her doctor's office. She described that since Monday she's been feeling sick felt unusually tired but then started experiencing body aches cough and congestion her nose is stuffy and runny, and has a sore throat that has progressed since Tuesday. She has recorded temperatures of 101.  she's been coughing unable to bring up any phlegm she does feel her chest to be congested. Denies any shortness of breath abdominal pain nausea or vomiting.   Patient is a 61 y.o. female presenting with cough. The history is provided by the patient.  Cough This is a new problem. The current episode started more than 2 days ago. The problem occurs constantly. The cough is non-productive. The maximum temperature recorded prior to her arrival was 101 to 101.9 F. Associated symptoms include chills, ear congestion, ear pain, headaches, rhinorrhea, sore throat and myalgias. Pertinent negatives include no shortness of breath and no wheezing. The treatment provided no relief. She is not a smoker. Her past medical history does not include COPD or asthma.    Past Medical History  Diagnosis Date  . Depression   . Seizure disorder   . Dyslipidemia   . Arthritis   . OSA (obstructive sleep apnea)   . Obesity   . Hiatal hernia   . Hemangioma   . Hx of adenomatous colonic polyps   . Myocardial infarct     subendocardrial, following R TKR  . GERD (gastroesophageal reflux disease)   . Gastritis   . Diabetes mellitus   . Hypothyroidism 2010    Dr.Deveshawr  . IBS (irritable bowel syndrome)     Dr Russella Dar  . Anemia   . Hyperlipidemia   . Hypertension   . Colon polyp    adenomatous    Past Surgical History  Procedure Date  . Cholecystectomy 1985  . Abdominal hysterectomy 1988  . Tubal ligation 1986  . Total knee arthroplasty 11/2005  . Cardiac catheterization 2007    DrGamble ( now seeing Dr Darrell Jewel cardiology)  . Total knee arthroplasty     left  . Steroid injection to left si joint 12/2008    Dr.Newton  . Polypectomy 2012    6 or 7 adenomatous, Dr.Stark    Family History  Problem Relation Age of Onset  . Colon cancer Neg Hx   . Heart attack Neg Hx   . Stroke Neg Hx   . Cancer Neg Hx   . Colon polyps Brother   . Colon polyps Mother   . Diabetes Sister   . Diabetes Paternal Aunt   . Ulcers Father     History  Substance Use Topics  . Smoking status: Never Smoker   . Smokeless tobacco: Not on file  . Alcohol Use: No    OB History    Grav Para Term Preterm Abortions TAB SAB Ect Mult Living                  Review of Systems  Constitutional: Positive for chills. Negative for fever, activity change and fatigue.  HENT: Positive for ear pain, sore throat and rhinorrhea. Negative for neck pain.   Respiratory: Positive for cough. Negative for shortness of breath and  wheezing.   Musculoskeletal: Positive for myalgias.  Skin: Negative for rash and wound.  Neurological: Positive for headaches.    Allergies  Review of patient's allergies indicates no known allergies.  Home Medications   Current Outpatient Rx  Name  Route  Sig  Dispense  Refill  . ALBUTEROL SULFATE HFA 108 (90 BASE) MCG/ACT IN AERS   Inhalation   Inhale 2 puffs into the lungs every 6 (six) hours as needed for wheezing.   1 Inhaler   5   . ASPIRIN 81 MG PO TABS   Oral   Take 81 mg by mouth daily.           . BUDESONIDE-FORMOTEROL FUMARATE 160-4.5 MCG/ACT IN AERO   Inhalation   Inhale 1-2 puffs into the lungs 2 (two) times daily.   1 Inhaler   5   . DILTIAZEM HCL ER COATED BEADS 240 MG PO TB24   Oral   Take 1 tablet (240 mg total) by mouth daily.    90 tablet   0   . DIPHENOXYLATE-ATROPINE 2.5-0.025 MG PO TABS   Oral   Take 1 tablet by mouth 4 (four) times daily as needed.   10 tablet   0     OFFICE VISIT IF NO BETTER   . DIVALPROEX SODIUM ER 500 MG PO TB24      2 by mouth in am, 2 by mouth in pm         . DOXYCYCLINE HYCLATE 100 MG PO CAPS   Oral   Take 1 capsule (100 mg total) by mouth 2 (two) times daily.   20 capsule   0   . DULOXETINE HCL 60 MG PO CPEP   Oral   Take 60 mg by mouth daily.           [name] FABB 2.2-25-1 MG PO TABS               . OMEGA-3 FATTY ACIDS 1000 MG PO CAPS   Oral   Take 2 g by mouth daily.           [name] FLUTICASONE PROPIONATE 50 MCG/ACT NA SUSP      USE 2 SPRAYS IN EACH NOSTRIL DAILY   16 g   5   . GABAPENTIN 300 MG PO CAPS   Oral   Take 300 mg by mouth 3 (three) times daily.           [name] GLUCOSE BLOOD VI STRP      1 each by Other route. (Ultra Blue Test Strips) Check blood sugar twice daily DX 250.02   200 each   3   . GUAIFENESIN-CODEINE 100-10 MG/5ML PO SYRP   Oral   Take 5 mLs by mouth 3 (three) times daily as needed for cough.   120 mL   0   . INSULIN PEN NEEDLE 32G X 6 MM MISC      1 Inject daily, DX: 250.00   100 each   3   . LEVOTHYROXINE SODIUM 25 MCG PO TABS      TAKE 1 TABLET DAILY   90 tablet   0   . LIRAGLUTIDE 18 MG/3ML Elmer SOLN   Subcutaneous   Inject 0.1 mLs (0.6 mg total) into the skin daily.   6 mL   2   . LOPERAMIDE HCL 2 MG PO TABS   Oral   Take 2 mg by mouth as needed.           [name]  PREDNISONE 20 MG PO TABS   Oral   Take 2 tablets (40 mg total) by mouth daily. 2 tablets daily for 5 days   10 tablet   0   . SIMVASTATIN 20 MG PO TABS      TAKE 1 TABLET (20 MG TOTAL) BY MOUTH AT BEDTIME.   90 tablet   0   . SITAGLIPTIN-METFORMIN HCL 50-500 MG PO TABS   Oral   Take 1 tablet by mouth 2 (two) times daily with a meal.   180 tablet   1   . ZOLPIDEM TARTRATE 10 MG PO TABS   Oral   Take 10 mg by mouth at bedtime.              BP 147/73  Pulse 99  Temp 98.3 F (36.8 C) (Oral)  Resp 22  SpO2 97%  Physical Exam  Nursing note and vitals reviewed. Constitutional: Vital signs are normal. She appears well-developed and well-nourished.  Non-toxic appearance. She does not have a sickly appearance. She does not appear ill. No distress.  HENT:  Head: Normocephalic.  Right Ear: Hearing, tympanic membrane, external ear and ear canal normal.  Left Ear: Tympanic membrane, external ear and ear canal normal.  Mouth/Throat: Uvula is midline and mucous membranes are normal. Posterior oropharyngeal erythema present. No oropharyngeal exudate, posterior oropharyngeal edema or tonsillar abscesses.  Eyes: Conjunctivae normal are normal.  Neck: Neck supple. No JVD present.  Cardiovascular: Normal rate.   No murmur heard. Pulmonary/Chest: Effort normal and breath sounds normal. No respiratory distress. She has no wheezes. She has no rales. She exhibits no tenderness.  Lymphadenopathy:    She has no cervical adenopathy.    ED Course  Procedures (including critical care time)   Labs Reviewed  POCT RAPID STREP A ([facility] ONLY)   No results found.   1. Upper respiratory infection   2. Bronchitis   3. Pharyngitis       MDM  Symptoms and exam were consistent upper respiratory infection- early stages of bronchitis. Patient is comfortable in no respiratory distress afebrile tolerating fluids well. Moderate to severe pharyngitis.        Jimmie Molly, MD 11/25/11 1031

## 2011-12-01 ENCOUNTER — Ambulatory Visit (INDEPENDENT_AMBULATORY_CARE_PROVIDER_SITE_OTHER): Payer: PRIVATE HEALTH INSURANCE | Admitting: Cardiovascular Disease

## 2011-12-01 ENCOUNTER — Telehealth: Payer: Self-pay | Admitting: Internal Medicine

## 2011-12-01 ENCOUNTER — Encounter: Payer: Self-pay | Admitting: Cardiovascular Disease

## 2011-12-01 VITALS — BP 145/88 | HR 104 | Ht 63.0 in | Wt 289.6 lb

## 2011-12-01 DIAGNOSIS — I10 Essential (primary) hypertension: Secondary | ICD-10-CM

## 2011-12-01 MED ORDER — LOSARTAN POTASSIUM 50 MG PO TABS: 50.0000 mg | ORAL_TABLET | Freq: Every day | ORAL | Status: DC

## 2011-12-01 MED ORDER — AZITHROMYCIN 250 MG PO TABS: ORAL_TABLET | ORAL | Status: DC

## 2011-12-01 NOTE — Telephone Encounter (Signed)
Noted  

## 2011-12-01 NOTE — Patient Instructions (Addendum)
Start Losartan 50mg  daily. (START WHEN Z-PAK IS FINISHED)  Your physician recommends that you return for lab work in: 4 weeks - BMET.  Your physician wants you to follow-up in: 1 year with Dr. Excell Seltzer. You will receive a reminder letter in the mail two months in advance. If you don't receive a letter, please call our office to schedule the follow-up appointment.  Z-Pak as directed.

## 2011-12-01 NOTE — Telephone Encounter (Signed)
Generic Tessalon Perles 200 mg one every 8 hours as needed for cough, #15. Office visit if no better

## 2011-12-01 NOTE — Telephone Encounter (Signed)
Caller: [name]/Patient; Patient Name: Alexis Grant; PCP: Marga Melnick; Best Callback Phone Number: [phone] Calling regarding she has a cough and sore throat, was seen at [facility] 11/24/11 and started on Doxycycline (which she has 1 day left), Prednisone and cough medicine, was diagnosed with bronchitis and viral infection, feels worse. Afebrile, cough with green/yellow mucus. Taking sugar free cough drops with relief. Emergent signs and symptoms ruled out as per Cough protocol except for see in 4 hours due to worsening cough and has history of cardiac condition, has an appt with Cardiologist at 11 am 12/01/11, she will call for appt with PCP if cough is not addressed.

## 2011-12-01 NOTE — Progress Notes (Signed)
HPI:  61 year old woman presenting for cardiac evaluation. The patient has several cardiovascular risk factors, including poorly controlled type 2 diabetes, hypertension, and hyperlipidemia. She was recently evaluated by Dr. Alwyn Ren and she complained of exertional chest discomfort. She was referred for further evaluation.  The patient has a history of undergoing diagnostic cardiac catheterization in 2007 by Dr. Elsie Lincoln. I have reviewed his cath report and she was noted to have normal coronary arteries at that time. Her left ventricular function has been normal. She also reports a remote history of atrial fibrillation but she tells me she has never been on any anticoagulant medications. She has not had any heart rhythm abnormalities noted in the last several years.  The patient reports dyspnea and chest discomfort with heavy exertion. She's had these symptoms for several years. She does not report any progression. She can do routine activities without any symptoms. She denies orthopnea, PND, or leg swelling. She describes her chest discomfort as a "muscle cramp" and "tightness."  She has not had any resting chest pain.  The patient incidentally reports about 2 weeks of upper respiratory symptoms. She has now developed a productive cough with green/yellow sputum. She's had subjective fevers and chills but has not measured her temperature.  Outpatient Encounter Prescriptions as of 12/01/2011  Medication Sig Dispense Refill  . albuterol (VENTOLIN HFA) 108 (90 BASE) MCG/ACT inhaler Inhale 2 puffs into the lungs every 6 (six) hours as needed for wheezing.  1 Inhaler  5  . aspirin 81 MG tablet Take 81 mg by mouth daily.        . budesonide-formoterol (SYMBICORT) 160-4.5 MCG/ACT inhaler Inhale 1-2 puffs into the lungs 2 (two) times daily.  1 Inhaler  5  . diphenoxylate-atropine (LOMOTIL) 2.5-0.025 MG per tablet Take 1 tablet by mouth 4 (four) times daily as needed.  10 tablet  0  . divalproex (DEPAKOTE) 500  MG 24 hr tablet 2 by mouth in am, 2 by mouth in pm      . doxycycline (VIBRAMYCIN) 100 MG capsule Take 1 capsule (100 mg total) by mouth 2 (two) times daily.  20 capsule  0  . FABB 2.2-25-1 MG TABS       . fish oil-omega-3 fatty acids 1000 MG capsule Take 2 g by mouth daily.        . fluticasone (FLONASE) 50 MCG/ACT nasal spray USE 2 SPRAYS IN EACH NOSTRIL DAILY  16 g  5  . glucose blood (ONE TOUCH TEST STRIPS) test strip 1 each by Other route. (Ultra Blue Test Strips) Check blood sugar twice daily DX 250.02  200 each  3  . guaiFENesin-codeine (ROBITUSSIN AC) 100-10 MG/5ML syrup Take 5 mLs by mouth 3 (three) times daily as needed for cough.  120 mL  0  . Insulin Pen Needle (NOVOFINE) 32G X 6 MM MISC 1 Inject daily, DX: 250.00  100 each  3  . levothyroxine (SYNTHROID, LEVOTHROID) 25 MCG tablet TAKE 1 TABLET DAILY  90 tablet  0  . Liraglutide (VICTOZA) 18 MG/3ML SOLN Inject 0.1 mLs (0.6 mg total) into the skin daily.  6 mL  2  . loperamide (IMODIUM A-D) 2 MG tablet Take 2 mg by mouth as needed.        . simvastatin (ZOCOR) 20 MG tablet TAKE 1 TABLET (20 MG TOTAL) BY MOUTH AT BEDTIME.  90 tablet  0  . sitaGLIPtan-metformin (JANUMET) 50-500 MG per tablet Take 1 tablet by mouth 2 (two) times daily with a meal.  180 tablet  1  . zolpidem (AMBIEN) 10 MG tablet Take 10 mg by mouth at bedtime.       [name] diltiazem (CARDIZEM LA) 240 MG 24 hr tablet Take 1 tablet (240 mg total) by mouth daily.  90 tablet  0  . [DISCONTINUED] DULoxetine (CYMBALTA) 60 MG capsule Take 60 mg by mouth daily.        . [DISCONTINUED] gabapentin (NEURONTIN) 300 MG capsule Take 300 mg by mouth 3 (three) times daily.          Review of patient's allergies indicates no known allergies.  Past Medical History  Diagnosis Date  . Depression   . Seizure disorder   . Dyslipidemia   . Arthritis   . OSA (obstructive sleep apnea)   . Obesity   . Hiatal hernia   . Hemangioma   . Hx of adenomatous colonic polyps   . Myocardial infarct       subendocardrial, following R TKR  . GERD (gastroesophageal reflux disease)   . Gastritis   . Diabetes mellitus   . Hypothyroidism 2010    Dr.Deveshawr  . IBS (irritable bowel syndrome)     Dr Russella Dar  . Anemia   . Hyperlipidemia   . Hypertension   . Colon polyp     adenomatous    Past Surgical History  Procedure Date  . Cholecystectomy 1985  . Abdominal hysterectomy 1988  . Tubal ligation 1986  . Total knee arthroplasty 11/2005  . Cardiac catheterization 2007    DrGamble ( now seeing Dr Darrell Jewel cardiology)  . Total knee arthroplasty     left  . Steroid injection to left si joint 12/2008    Dr.Newton  . Polypectomy 2012    6 or 7 adenomatous, Dr.Stark    History   Social History  . Marital Status: Widowed    Spouse Name: N/A    Number of Children: N/A  . Years of Education: N/A   Occupational History  . Not on file.   Social History Main Topics  . Smoking status: Never Smoker   . Smokeless tobacco: Not on file  . Alcohol Use: No  . Drug Use: No  . Sexually Active: Not on file   Other Topics Concern  . Not on file   Social History Narrative   Illicit drug use- noPatient does not get regular exercise due to knee.    Family History  Problem Relation Age of Onset  . Colon cancer Neg Hx   . Heart attack Neg Hx   . Stroke Neg Hx   . Cancer Neg Hx   . Colon polyps Brother   . Colon polyps Mother   . Diabetes Sister   . Diabetes Paternal Aunt   . Ulcers Father     ROS: General: no fevers/chills/night sweats Eyes: no blurry vision, diplopia, or amaurosis ENT: no sore throat or hearing loss Resp: no cough, wheezing, or hemoptysis CV: no edema or palpitations GI: no abdominal pain, nausea, vomiting, diarrhea, or constipation GU: no dysuria, frequency, or hematuria Skin: no rash Neuro: no headache, numbness, tingling, or weakness of extremities Musculoskeletal: no joint pain or swelling Heme: no bleeding, DVT, or easy bruising Endo: no  polydipsia or polyuria  BP 145/88  Pulse 104  Ht 5\' 3"  (1.6 m)  Wt 131.362 kg (289 lb 9.6 oz)  BMI 51.30 kg/m2  SpO2 99%  PHYSICAL EXAM: Pt is alert and oriented, pleasant obese woman in no distress. HEENT: normal Neck: JVP normal.  Carotid upstrokes normal without bruits. No thyromegaly. Lungs: equal expansion, clear bilaterally CV: Apex is discrete and nondisplaced, RRR without murmur or gallop Abd: soft, NT, +BS, no bruit, no hepatosplenomegaly Back: no CVA tenderness Ext: no C/C/E        DP/PT pulses intact and = Skin: warm and dry without rash Neuro: CNII-XII intact             Strength intact = bilaterally  EKG:  Normal sinus rhythm 91 beats per minute, nonspecific ST abnormality.  ASSESSMENT AND PLAN: 1. Chest pain with exertion. The patient's symptoms have not changed and several years. She's had a normal cardiac catheterization dating back to 2007 when she had similar symptoms. I would recommend ongoing medical treatment. Her body habitus would make a stress imaging challenging and I suspect there would be a high likelihood of a false positive test. She is on appropriate therapy with aspirin and a statin drug. Considering her hypertension and underlying diabetes, I recommended starting losartan 50 mg daily as well. She will have a followup metabolic panel checked in 4 weeks. I would like to see her back in one year for followup.  2. Hypertension, essential. The patient is on diltiazem and seems to be tolerating this well. I have taken the liberty of adding losartan 50 mg daily.  3. Hyperlipidemia. Her lipids were reviewed and LFTs are within normal limits. Lipids are at goal with an LDL less than 100. She is on low dose simvastatin and actually is on a maximal dose of this with concomitant calcium channel blockade. As her lipids are well controlled she is tolerating this without problems, have recommended continuation of simvastatin at a dose of 20 mg daily.  4. Acute  bronchitis. The patient has persistent symptoms with a worsening productive cough. I prescribed her azithromycin in a Z-Pak. If no improvement she will followup with Dr. Alwyn Ren.  Tonny Bollman 12/01/2011 2:28 PM

## 2011-12-02 ENCOUNTER — Ambulatory Visit: Payer: PRIVATE HEALTH INSURANCE | Admitting: Dietician

## 2011-12-02 NOTE — Telephone Encounter (Signed)
Spoke with patient, patient states the cardiologist gave her ABX. Patient will monitor symptoms and call for appointment if needed

## 2011-12-08 ENCOUNTER — Ambulatory Visit (INDEPENDENT_AMBULATORY_CARE_PROVIDER_SITE_OTHER): Payer: PRIVATE HEALTH INSURANCE | Admitting: Internal Medicine

## 2011-12-08 ENCOUNTER — Encounter: Payer: Self-pay | Admitting: Internal Medicine

## 2011-12-08 VITALS — BP 122/84 | HR 88 | Temp 98.1°F | Wt 298.8 lb

## 2011-12-08 DIAGNOSIS — J019 Acute sinusitis, unspecified: Secondary | ICD-10-CM

## 2011-12-08 DIAGNOSIS — F430 Acute stress reaction: Secondary | ICD-10-CM

## 2011-12-08 DIAGNOSIS — R197 Diarrhea, unspecified: Secondary | ICD-10-CM

## 2011-12-08 DIAGNOSIS — R059 Cough, unspecified: Secondary | ICD-10-CM

## 2011-12-08 DIAGNOSIS — R05 Cough: Secondary | ICD-10-CM

## 2011-12-08 DIAGNOSIS — J302 Other seasonal allergic rhinitis: Secondary | ICD-10-CM | POA: Insufficient documentation

## 2011-12-08 MED ORDER — METRONIDAZOLE 500 MG PO TABS: 500.0000 mg | ORAL_TABLET | Freq: Three times a day (TID) | ORAL | Status: DC

## 2011-12-08 MED ORDER — CITALOPRAM HYDROBROMIDE 20 MG PO TABS: 20.0000 mg | ORAL_TABLET | Freq: Every day | ORAL | Status: DC

## 2011-12-08 MED ORDER — FLUTICASONE-SALMETEROL 250-50 MCG/DOSE IN AEPB: 1.0000 | INHALATION_SPRAY | Freq: Two times a day (BID) | RESPIRATORY_TRACT | Status: DC

## 2011-12-08 NOTE — Progress Notes (Signed)
  Subjective:    Patient ID: Alexis Grant, female    DOB: 1950-09-15, 61 y.o.   MRN: [id number]  HPI  Symptoms began approximately 3 weeks ago as fatigue associated with a dry cough, sneezing, and itchy, watery eyes. She was seen in urgent care and treated with doxycycline. Subsequently she received a course of Zithromax from her cardiologist.  She describes frontal headache, facial pain, and nasal purulence. She previously had some fever and chills initially but these has resolved. She has no associated wheezing or shortness of breath.  There is no history of asthma or smoking; but she has used albuterol for reactive airways disease phenomena which appear in the context of exacerbations of her seasonal allergies.  Stools have been watery within the last 24 hours; she took some Imodium with help    Review of Systems She is under a great deal of stress as her brother and sister have cancer in her son is profoundly limited by arthritis. This is impacting sleep.  Past history she previously taken Paxil after the death of her husband; this had been of benefit.     Objective:   Physical Exam General appearance:well nourished; no acute distress or increased work of breathing is present.  No  lymphadenopathy about the head, neck, or axilla noted.   Eyes: No conjunctival inflammation or lid edema is present. There is no scleral icterus.  Ears:  External ear exam shows no significant lesions or deformities.  Otoscopic examination reveals clear canals, tympanic membranes are intact bilaterally without bulging, retraction, inflammation or discharge.  Nose:  External nasal examination shows no deformity or inflammation. Nasal mucosa are pink and moist without lesions or exudates. No septal dislocation or deviation.No obstruction to airflow.   Oral exam: Dental hygiene is good; lips and gums are healthy appearing.There is no oropharyngeal erythema or exudate noted.   Neck:  No deformities,  thyromegaly, masses, or tenderness noted.   Supple with full range of motion without pain.   Heart:  Normal rate and regular rhythm. S1 and S2 normal without gallop, murmur, click, rub or other extra sounds.   Lungs:Chest clear to auscultation; no wheezes, rhonchi,rales ,or rubs present.No increased work of breathing.    Extremities:  No cyanosis, edema, or clubbing  noted    Skin: Warm & dry   Psych:  Cognition and judgment appear intact. Alert, communicative  and cooperative with normal attention span and concentration. Tearful when discussing family health issues          Assessment & Plan:  #1 rhinosinusitis; status post doxycycline and Zithromax. It is possible that this represents a resistant organism  #2 cough secondary to #1 and possible reactive airway component  #3 exogenous stress related to family health issues  #4 change in stool in the context of 2 courses of antibiotics; there could increase risk for Clostridium difficile infection  Plan: See orders  & recommendations

## 2011-12-08 NOTE — Patient Instructions (Addendum)
Please take the probiotic , Align, every day until the bowels are normal. This will replace the normal bacteria which  are necessary for formation of normal stool and processing of food.  Plain Mucinex for thick secretions ;force NON dairy fluids . Use a Neti pot daily as needed for sinus congestion; going from open side to congested side . Nasal cleansing in the shower as discussed. Make sure that all residual soap is removed to prevent irritation. Fluticasone 1 spray in each nostril twice a day as needed. Use the "crossover" technique as discussed. Plain Allegra 160 daily as needed for itchy eyes & sneezing. Advair one inhalation every 12 hours; gargle and spit after use

## 2011-12-09 ENCOUNTER — Encounter: Payer: PRIVATE HEALTH INSURANCE | Attending: Internal Medicine | Admitting: Dietician

## 2011-12-09 ENCOUNTER — Encounter: Payer: Self-pay | Admitting: Dietician

## 2011-12-09 VITALS — Ht 63.0 in | Wt 304.8 lb

## 2011-12-09 DIAGNOSIS — E119 Type 2 diabetes mellitus without complications: Secondary | ICD-10-CM | POA: Insufficient documentation

## 2011-12-09 DIAGNOSIS — Z713 Dietary counseling and surveillance: Secondary | ICD-10-CM | POA: Insufficient documentation

## 2011-12-09 NOTE — Patient Instructions (Addendum)
   Don't skip meals.  Aim for 15-30 Gm of carb at each meal.  It Alexis Grant well be a small meal, but you need to put something into your body to keep the metabolism going.    Aim for 15 Gm of Carb at snack times.  Have the small meals/snacks through out the day.  Have a protein source at all meals and snacks.  Use your food labels and aim for fiber in all breads, cereals, and grains.  Aim to keep the sugar level on the food label at (0-9 gm) per serving.  Aim for 150 minutes of walking every 7 days.  Work up slowly.  Consider walking in the pool on the weekend for 45-60 minutes.  Use the stress reduction techniques that will work the best for you.  Consider seeing Marco Collie for further assistance with stress reduction measures.  Keep fresh veggies readily available.  Consider using the frozen products that require only microwaving.

## 2011-12-09 NOTE — Progress Notes (Signed)
  Medical Nutrition Therapy:  Appt start time: 1630 end time:  1730.   Assessment:  Primary concerns today: Needs a good meal plan. Most recent A1C is at 8.7%.  Gives a history of type 2 diabetes for 6 years.  Has had no formal diabetes education.  Wanting to eat better and to lose more weight.  Current weight is a t 304.8 lbs.  Prior to starting Victoza her weight was at 313 lbs.  Verbalizes that she is under a great deal of stress both at home and at her job.  Currently, is not relaxing, she is not taking care of herself.  Others come first in her life.  She essentially admitted that she has only become concerned when her health has begun to decline.[name]  BLOOD GLUCOSE MONITORING:  Fasting: 100-110 mg/dl       After the largest meal of the day:  Usually in the 180's at 1 hour after the meal.  HYPERGLYCEMIA:  Notes increased thirst, drowsiness, and blurred vision with increased glucose levels.  HYPOGLYCEMIA:  Notes with low blood glucose will experience dizziness, sweating, and grouchy mood.   MEDICATIONS: Completed review of medications.   DIETARY INTAKE:  24-hr recall: Victoza has markedly decreased appetite. B ( AM): 10:00 diet soda or green tea with citrus.  Snk ( AM): None  L ( PM):12:30  Protein shake (Adkins :  Protein 15 gm, carb 6 gm) Snk ( PM): 2-3 PM apple or banana or yogurt ( greek  carbs at 9 and protin 112 gm D ( PM): None Snk ( PM): Will  Eat 3 days out of 7 days.  Salad with grill chicken breast and low fat [place] dressing. Beverages: 2 green tea at 32 oz or diet soda 16 oz water (16 oz)     Usual physical activity: Currently has no established exercise regimen  Estimated energy needs:  HT: 63 in  WT: 304.8 lb  BMI: 54.1 kg/m2   Adj WT: 175 lb  (80 kg) 1200-1300 calories 135 g carbohydrates 90-95 g protein 32-35 g fat  Progress Towards Goal(s):  In progress.   Nutritional Diagnosis:  [place]-2.1 Inpaired nutrition utilization As related to blood glucose.  As evidenced  by Diagnosis of type 2 diabetes, A1C at 8.7%, and increased post meal blood glucose levels..    Intervention:  Nutrition review of food nutrient groups and their effects on blood glucose levels. Review of food label and portion sizes.  Review of the CHO foods and carb counting using the food label and the exchange system.  Recommended keeping CHO intake to 15-30 gm per meal and 15 gm per snack with a serving of protein at all meals and snacks. Recommended that she consider seeing phycologist Shona Simpson for some stress reduction techniques and emotional support.  Recommended that she daily continue to walk in her building and to look at the possibility of walking in the water on the weekend.  Handouts given during visit include:  Living Well with Diabetes  Controlling Blood Glucose  Carbohydrate Counting Guide by [employer] suggestions for 30 gm CHO meals  Snack list  Monitoring/Evaluation:  Dietary intake, exercise, blood glucose levels, and body weight in 6-8 weeks.[name]

## 2011-12-28 ENCOUNTER — Other Ambulatory Visit: Payer: PRIVATE HEALTH INSURANCE

## 2012-01-04 ENCOUNTER — Other Ambulatory Visit (INDEPENDENT_AMBULATORY_CARE_PROVIDER_SITE_OTHER): Payer: PRIVATE HEALTH INSURANCE

## 2012-01-04 DIAGNOSIS — IMO0001 Reserved for inherently not codable concepts without codable children: Secondary | ICD-10-CM

## 2012-01-04 LAB — BASIC METABOLIC PANEL WITH GFR
BUN: 7 mg/dL (ref 6–23)
CO2: 26 meq/L (ref 19–32)
Calcium: 9.1 mg/dL (ref 8.4–10.5)
Chloride: 101 meq/L (ref 96–112)
Creatinine, Ser: 0.5 mg/dL (ref 0.4–1.2)
GFR: 136.42 mL/min
Glucose, Bld: 128 mg/dL — ABNORMAL HIGH (ref 70–99)
Potassium: 4 meq/L (ref 3.5–5.1)
Sodium: 133 meq/L — ABNORMAL LOW (ref 135–145)

## 2012-01-04 LAB — MICROALBUMIN / CREATININE URINE RATIO
Creatinine,U: 73.5 mg/dL
Microalb Creat Ratio: 0.7 mg/g (ref 0.0–30.0)

## 2012-01-04 LAB — HEMOGLOBIN A1C: Hgb A1c MFr Bld: 7.3 % — ABNORMAL HIGH (ref 4.6–6.5)

## 2012-02-11 ENCOUNTER — Other Ambulatory Visit: Payer: Self-pay | Admitting: Internal Medicine

## 2012-02-11 NOTE — Telephone Encounter (Signed)
Per last lab check 01/04/12, patient to be seen prior to refill on DM medication. Spoke with patient, patient to be seen on Monday

## 2012-02-14 ENCOUNTER — Ambulatory Visit (INDEPENDENT_AMBULATORY_CARE_PROVIDER_SITE_OTHER): Payer: PRIVATE HEALTH INSURANCE | Admitting: Internal Medicine

## 2012-02-14 ENCOUNTER — Encounter: Payer: Self-pay | Admitting: Internal Medicine

## 2012-02-14 VITALS — BP 118/80 | HR 77 | Wt 298.0 lb

## 2012-02-14 DIAGNOSIS — E119 Type 2 diabetes mellitus without complications: Secondary | ICD-10-CM

## 2012-02-14 MED ORDER — SITAGLIPTIN PHOS-METFORMIN HCL 50-500 MG PO TABS: 1.0000 | ORAL_TABLET | Freq: Two times a day (BID) | ORAL | Status: DC

## 2012-02-14 MED ORDER — LIRAGLUTIDE 18 MG/3ML ~~LOC~~ SOLN: 1.8000 mg | Freq: Every day | SUBCUTANEOUS | Status: DC

## 2012-02-14 NOTE — Patient Instructions (Addendum)
A1c in 3-4 months ; Code: 250.02

## 2012-02-14 NOTE — Progress Notes (Signed)
Subjective:    Patient ID: Alexis Grant, female    DOB: 09/11/1950, 62 y.o.   MRN: [id number]  HPI Diabetes status assessment: Fasting or morning glucose range 118-127 Glucose 2 hours after any meal is not monitored usually. No hypoglycemia reported                                                                                                                 Exercise described as walking 1/2 mpd 7 times per week  Low sugar/carb , increased vegetables Medication compliance is good. No medication adverse effects noted. Eye exam current. Foot care not current  A1c/ urine microalbumin monitor  01/04/12 7.3%/0.5 (average sugar 163  with long-term 74 %  risk )   .         Review of Systems Excess thirst ; no excess hunger ;no excess urination reported                              No lightheadedness with standing reported No chest pain ; palpitations ; claudication described .                                                                                                                             No non healing skin  ulcers or sores of extremities noted. No numbness or tingling or burning in feet described                                                                                                                                             Significant change in weight of  10# pounds lost. No blurred,double, or loss of vision reported       Objective:   Physical Exam General appearance is one of good  nourishment w/o distress.  Eyes: No conjunctival  inflammation or scleral icterus is present.  Oral exam: Dental hygiene is good; lips and gums are healthy appearing.There is no oropharyngeal erythema or exudate noted.  Small osteoma  Heart:  Normal rate and regular rhythm. S1 and S2 normal without gallop, murmur, click, rub or other extra sounds  .S4 with slurring     Lungs:Chest clear to auscultation; no wheezes, rhonchi,rales ,or rubs present.No increased work of breathing.    Abdomen: bowel sounds normal, soft and non-tender without masses, organomegaly or hernias noted.  No guarding or rebound   Skin:Warm & dry.  Intact without suspicious lesions or rashes   Lymphatic: No lymphadenopathy is noted about the head, neck, axilla                Assessment & Plan:

## 2012-02-14 NOTE — Assessment & Plan Note (Signed)
There's been a dramatic improvement in the risk; risk has decreased almost 30%. She is beginning an exercise program. I recommend increasing Victoza up to 1.8 units daily and rechecking the A1c in 3-4 months. I would hope to change her from Janumet to metformin at that time. Most important is absence of hypoglycemic episodes.

## 2012-02-23 ENCOUNTER — Other Ambulatory Visit: Payer: Self-pay | Admitting: Internal Medicine

## 2012-03-10 ENCOUNTER — Other Ambulatory Visit: Payer: Self-pay | Admitting: Internal Medicine

## 2012-04-10 ENCOUNTER — Other Ambulatory Visit: Payer: Self-pay | Admitting: *Deleted

## 2012-04-10 DIAGNOSIS — E039 Hypothyroidism, unspecified: Secondary | ICD-10-CM

## 2012-04-10 MED ORDER — LEVOTHYROXINE SODIUM 25 MCG PO TABS: ORAL_TABLET | ORAL | Status: DC

## 2012-04-10 NOTE — Telephone Encounter (Signed)
[facility] [employer]

## 2012-04-25 ENCOUNTER — Other Ambulatory Visit: Payer: Self-pay | Admitting: Neurology

## 2012-04-26 ENCOUNTER — Other Ambulatory Visit: Payer: Self-pay | Admitting: Internal Medicine

## 2012-06-30 ENCOUNTER — Telehealth: Payer: Self-pay | Admitting: Internal Medicine

## 2012-06-30 NOTE — Telephone Encounter (Signed)
Patient aware ok to pick-up samples

## 2012-06-30 NOTE — Telephone Encounter (Signed)
Patient is calling to see if Dr. Alwyn Ren can provide her with any samples of Janumet and Victoza. She states that her insurance has changed and they have informed her that it will be another 3-4 days of processing before she can get these medications filled. Patient is going to [place] to see about a family member and will run out before she gets back. Please advise patient about if samples are available.

## 2012-08-05 ENCOUNTER — Other Ambulatory Visit: Payer: Self-pay | Admitting: Internal Medicine

## 2012-08-07 NOTE — Telephone Encounter (Signed)
A1c MALB 250.00 

## 2012-08-23 ENCOUNTER — Other Ambulatory Visit: Payer: Self-pay | Admitting: Neurology

## 2012-09-13 ENCOUNTER — Other Ambulatory Visit: Payer: Self-pay | Admitting: Internal Medicine

## 2012-09-13 ENCOUNTER — Other Ambulatory Visit: Payer: Self-pay | Admitting: Neurology

## 2012-09-13 NOTE — Telephone Encounter (Signed)
Med filled.  

## 2012-10-12 ENCOUNTER — Other Ambulatory Visit: Payer: Self-pay | Admitting: Internal Medicine

## 2012-10-13 NOTE — Telephone Encounter (Signed)
Med filled with 0 refills. Letter mailed to pt to inform need for OV and labs.

## 2012-10-20 ENCOUNTER — Encounter: Payer: Self-pay | Admitting: Nurse Practitioner

## 2012-10-20 ENCOUNTER — Ambulatory Visit (INDEPENDENT_AMBULATORY_CARE_PROVIDER_SITE_OTHER): Payer: BC Managed Care – PPO | Admitting: Nurse Practitioner

## 2012-10-20 VITALS — BP 128/73 | HR 80 | Ht 64.25 in | Wt 309.0 lb

## 2012-10-20 DIAGNOSIS — G40309 Generalized idiopathic epilepsy and epileptic syndromes, not intractable, without status epilepticus: Secondary | ICD-10-CM

## 2012-10-20 DIAGNOSIS — G43009 Migraine without aura, not intractable, without status migrainosus: Secondary | ICD-10-CM

## 2012-10-20 DIAGNOSIS — Z79899 Other long term (current) drug therapy: Secondary | ICD-10-CM

## 2012-10-20 MED ORDER — DIVALPROEX SODIUM ER 500 MG PO TB24: ORAL_TABLET | ORAL | Status: DC

## 2012-10-20 NOTE — Patient Instructions (Addendum)
Pt to continue Depakote at current dose, she has cut back to 1000mg  daily on her own.  Will check depakote level today.  Routine labs are done by Dr. Alwyn Ren Continue CPAP F/U yearly

## 2012-10-20 NOTE — Progress Notes (Signed)
[facility]  PATIENT: Alexis Grant DOB: 10-09-1950   REASON FOR VISIT: Followup for seizure disorder    HISTORY OF PRESENT ILLNESS: Alexis Grant 62 year old white female returns today for followup. She was last seen 03/31/11. She has a history of seizure disorder with last known seizure in 2002. She also has a history of migraine.  She has been on Topamax in the past and had side effects of GI distress. She is currently on Depakote with better control of headaches.  She also has a history of reflux, fibromyalgia, and sleep  apnea. She is using her CPAP routinely however no recent download. She has a history of metabolic syndrome and, morbid obesity and dyslipidemia.  CBG's generally between 130-140. No recent labs but claims her routine labs reviewed in the next month or so with Dr. Alwyn Ren.       REVIEW OF SYSTEMS: Full 14 system review of systems performed and notable only for:  Constitutional: N/A  Cardiovascular: N/A  Ear/Nose/Throat: N/A  Skin: N/A  Eyes: Blurred vision  Respiratory: N/A  Gastroitestinal: N/A  Hematology/Lymphatic: N/A  Endocrine: N/A Musculoskeletal: Joint pain, aching muscles  Allergy/Immunology: N/A  Neurological: N/A Psychiatric: N/A   ALLERGIES: No Known Allergies  HOME MEDICATIONS: Outpatient Prescriptions Prior to Visit  Medication Sig Dispense Refill  . aspirin 81 MG tablet Take 81 mg by mouth daily.        . citalopram (CELEXA) 20 MG tablet TAKE ONE TABLET BY MOUTH EVERY DAY  30 tablet  5  . divalproex (DEPAKOTE ER) 500 MG 24 hr tablet TAKE 2 TABLETS BY MOUTH TWICE A DAY  120 tablet  0  . fish oil-omega-3 fatty acids 1000 MG capsule Take 2 g by mouth daily.        . fluticasone (FLONASE) 50 MCG/ACT nasal spray USE 2 SPRAYS IN EACH NOSTRIL DAILY  16 g  5  . Fluticasone-Salmeterol (ADVAIR DISKUS) 250-50 MCG/DOSE AEPB Inhale 1 puff into the lungs 2 (two) times daily.  14 each  0  . glucose blood (ONE TOUCH TEST STRIPS) test  strip 1 each by Other route. (Ultra Blue Test Strips) Check blood sugar twice daily DX 250.02  200 each  3  . Insulin Pen Needle (NOVOFINE) 32G X 6 MM MISC 1 Inject daily, DX: 250.00  100 each  3  . JANUMET 50-500 MG per tablet TAKE 1 TABLET BY MOUTH TWICE A DAY  60 tablet  0  . levothyroxine (SYNTHROID, LEVOTHROID) 25 MCG tablet TAKE 1 TABLET DAILY  90 tablet  0  . loperamide (IMODIUM A-D) 2 MG tablet Take 2 mg by mouth as needed.        [name] losartan (COZAAR) 50 MG tablet Take 1 tablet (50 mg total) by mouth daily.  90 tablet  3  . simvastatin (ZOCOR) 20 MG tablet TAKE 1 TABLET (20 MG TOTAL) BY MOUTH AT BEDTIME.  90 tablet  2  . VICTOZA 18 MG/3ML SOPN INJECT 0.1ML'S INTO SKIN DAILY  27 mL  0  . zolpidem (AMBIEN) 10 MG tablet Take 10 mg by mouth at bedtime.       . diphenoxylate-atropine (LOMOTIL) 2.5-0.025 MG per tablet Take 1 tablet by mouth 4 (four) times daily as needed.  10 tablet  0   No facility-administered medications prior to visit.    PAST MEDICAL HISTORY: Past Medical History  Diagnosis Date  . Depression   . Seizure disorder   . Dyslipidemia   . Arthritis   .  OSA (obstructive sleep apnea)   . Obesity   . Hiatal hernia   . Hemangioma   . Hx of adenomatous colonic polyps   . Myocardial infarct     subendocardrial, following R TKR  . GERD (gastroesophageal reflux disease)   . Gastritis   . Diabetes mellitus   . Hypothyroidism 2010    Dr.Deveshawr  . IBS (irritable bowel syndrome)     Dr Russella Dar  . Anemia   . Hyperlipidemia   . Hypertension   . Colon polyp     adenomatous    PAST SURGICAL HISTORY: Past Surgical History  Procedure Laterality Date  . Cholecystectomy  1985  . Abdominal hysterectomy  1988  . Tubal ligation  1986  . Total knee arthroplasty  11/2005  . Cardiac catheterization  2007    DrGamble ( now seeing Dr Darrell Jewel cardiology)  . Total knee arthroplasty      left  . Steroid injection to left si joint  12/2008    Dr.Newton  . Polypectomy   2012    6 or 7 adenomatous, Dr.Stark    FAMILY HISTORY: Family History  Problem Relation Age of Onset  . Colon cancer Neg Hx   . Heart attack Neg Hx   . Stroke Neg Hx   . Cancer Neg Hx   . Colon polyps Brother   . Colon polyps Mother   . Diabetes Sister   . Diabetes Paternal Aunt   . Ulcers Father     SOCIAL HISTORY: History   Social History  . Marital Status: Widowed    Spouse Name: N/A    Number of Children: 2  . Years of Education: N/A   Occupational History  .    [name] Retired    Social History Main Topics  . Smoking status: Never Smoker   . Smokeless tobacco: Never Used  . Alcohol Use: No  . Drug Use: No  . Sexual Activity: Not on file   Other Topics Concern  . Not on file   Social History Narrative   Illicit drug use- no   Patient does not get regular exercise due to knee.   Patient lives at home alone.    Patient son and wife lives next door.   Patient has 3 children.    Patient has some college.    Patient retired May 2014.      PHYSICAL EXAM  Filed Vitals:   10/20/12 1108  BP: 128/73  Pulse: 80  Height: 5' 4.25" (1.632 m)  Weight: 309 lb (140.161 kg)   Body mass index is 52.62 kg/(m^2).  Generalized: Well developed, morbidly obese in no acute distress  Head: normocephalic and atraumatic,. Oropharynx benign  Neck: Supple, no carotid bruits  Cardiac: Regular rate rhythm, no murmur    Neurological examination   Mentation: Alert oriented to time, place, history taking. Follows all commands speech and language fluent  Cranial nerve II-XII: Pupils were equal round reactive to light extraocular movements were full, visual field were full on confrontational test. Facial sensation and strength were normal. hearing was intact to finger rubbing bilaterally. Uvula tongue midline. head turning and shoulder shrug and were normal and symmetric.Tongue protrusion into cheek strength was normal. Motor: normal bulk and tone, full strength in the BUE, BLE,  fine finger movements normal, no pronator drift. No focal weakness Sensory: normal and symmetric to light touch, pinprick, and  vibration  Coordination: finger-nose-finger, heel-to-shin bilaterally, no dysmetria Reflexes: 1+ upper and lower and symmetric  Gait  and Station: Rising up from seated position without assistance, normal stance, without trunk ataxia, moderate stride, good arm swing, smooth turning, able to perform tiptoe, and heel walking without difficulty. Unsteady with tandem  DIAGNOSTIC DATA (LABS, IMAGING, TESTING) - I reviewed patient records, labs, notes, testing and imaging myself where available.  Lab Results  Component Value Date   WBC 7.0 08/23/2011   HGB 13.4 08/23/2011   HCT 40.1 08/23/2011   MCV 83.8 08/23/2011   PLT 224.0 08/23/2011      Component Value Date/Time   NA 133* 01/04/2012 0808   K 4.0 01/04/2012 0808   CL 101 01/04/2012 0808   CO2 26 01/04/2012 0808   GLUCOSE 128* 01/04/2012 0808   BUN 7 01/04/2012 0808   CREATININE 0.5 01/04/2012 0808   CREATININE 0.64 04/09/2011 0921   CALCIUM 9.1 01/04/2012 0808   PROT 6.4 10/22/2011 0853   ALBUMIN 3.4* 10/22/2011 0853   AST 14 10/22/2011 0853   ALT 17 10/22/2011 0853   ALKPHOS 61 10/22/2011 0853   BILITOT 0.4 10/22/2011 0853   GFRNONAA 108.97 05/08/2009 1320   GFRAA  Value: >60        The eGFR has been calculated using the MDRD equation. This calculation has not been validated in all clinical situations. eGFR's persistently <60 mL/min signify possible Chronic Kidney Disease. 11/12/2008 0615   Lab Results  Component Value Date   CHOL 154 10/22/2011   HDL 49.20 10/22/2011   LDLCALC 79 10/22/2011   TRIG 129.0 10/22/2011   CHOLHDL 3 10/22/2011   Lab Results  Component Value Date   HGBA1C 7.3* 01/04/2012   No results found for this basename: VITAMINB12   Lab Results  Component Value Date   TSH 1.27 10/26/2011      ASSESSMENT AND PLAN  62 y.o. year old female  has a past medical history of Depression; Seizure  disorder; Dyslipidemia; Arthritis; OSA (obstructive sleep apnea); Obesity; Hiatal hernia; Hemangioma; adenomatous colonic polyps; Myocardial infarct; GERD (gastroesophageal reflux disease); Gastritis; Diabetes mellitus; Hypothyroidism (2010); IBS (irritable bowel syndrome); Anemia; Hyperlipidemia; Hypertension; and Colon polyp. here to followup for seizure disorder and headaches both are in good control. She tells me she has reduced her Depakote from 2000 mg daily to 1000 mg daily.   Pt to continue Depakote at current dose, she has cut back to 1000mg  daily on her own.  Will check depakote level today.  Routine labs are done by Dr. Alwyn Ren Continue CPAP, need to bring in download F/U yearly Nilda Riggs, [facility], [facility], APRN  [facility] [address] [place], [place] [address] [phone]

## 2012-10-25 ENCOUNTER — Ambulatory Visit (INDEPENDENT_AMBULATORY_CARE_PROVIDER_SITE_OTHER): Payer: BC Managed Care – PPO | Admitting: Internal Medicine

## 2012-10-25 ENCOUNTER — Encounter: Payer: Self-pay | Admitting: Internal Medicine

## 2012-10-25 VITALS — BP 132/84 | HR 84 | Temp 98.2°F | Wt 307.2 lb

## 2012-10-25 DIAGNOSIS — I10 Essential (primary) hypertension: Secondary | ICD-10-CM

## 2012-10-25 DIAGNOSIS — E119 Type 2 diabetes mellitus without complications: Secondary | ICD-10-CM

## 2012-10-25 DIAGNOSIS — E039 Hypothyroidism, unspecified: Secondary | ICD-10-CM

## 2012-10-25 DIAGNOSIS — E785 Hyperlipidemia, unspecified: Secondary | ICD-10-CM

## 2012-10-25 DIAGNOSIS — E559 Vitamin D deficiency, unspecified: Secondary | ICD-10-CM

## 2012-10-25 DIAGNOSIS — Z23 Encounter for immunization: Secondary | ICD-10-CM

## 2012-10-25 LAB — MICROALBUMIN / CREATININE URINE RATIO
Creatinine,U: 69.4 mg/dL
Microalb, Ur: 1.5 mg/dL (ref 0.0–1.9)

## 2012-10-25 MED ORDER — SITAGLIPTIN PHOS-METFORMIN HCL 50-500 MG PO TABS: ORAL_TABLET | ORAL | Status: DC

## 2012-10-25 NOTE — Assessment & Plan Note (Addendum)
Lipids, LFTs,CK 

## 2012-10-25 NOTE — Patient Instructions (Signed)

## 2012-10-25 NOTE — Assessment & Plan Note (Signed)
A1c & urine microalb 

## 2012-10-25 NOTE — Assessment & Plan Note (Signed)
TSH 

## 2012-10-25 NOTE — Assessment & Plan Note (Signed)
BMET  Goals discussed 

## 2012-10-25 NOTE — Progress Notes (Signed)
  Subjective:    Patient ID: Alexis Grant, female    DOB: 1950-05-17, 62 y.o.   MRN: [id number]  HPI   She has been compliant with her  hypertensive medications ; she's had no adverse effects from these. She does not monitor her blood pressure at home. Her last renal function was 01/04/12; creatinine 0.5, BUN 7, and GFR 136.42.  Her average fasting glucose is 118 on Victoza & Janumet. She does not monitor glucoses 2 hours after meals. There has been medication compliance.She denies any hypoglycemia. She does describe excessive thirst. Her ophthalmologic exam is due in one month; there's been no retinopathy on prior exams. She does not have blurred vision, double vision or loss of vision. Her last A1c was 7.3% 01/04/12. A1c had been 8.7% in October 2013.  She has been compliant with her statin without adverse effects ( but see "myalgias " below). Lipids were essentially at goal 10/22/11. LDL is 79 and HDL 49.20. Triglycerides were not elevated. She also has some myalgias in her extremities.  She has been exercising walking 30 minutes daily. She has a low carb nutrition program.    Review of Systems    She is not having excessive hunger or urination. She has no numbness, tingling, or burning in the extremities. She also has no nonhealing skin lesions. She continues to have significant headaches and is followed by Dr Marylou Flesher and is on Depakote. If headaches are severe she will take a five-day course of oral prednisone.  She's had some abdominal discomfort described as cramps in the lower abdomen bilaterally. This is attributed to irritable bowel syndrome. This is associated with intermittent diarrhea for which she takes generic Imodium right ear as needed.  She has been evaluated by Dr. Russella Dar, gastrologist. Probiotics have not been of benefit. This is not associated with unexplained weight loss, melena, or rectal bleeding.     Objective:   Physical Exam Gen.:  well-nourished, appropriate and  alert, weight excess Eyes: No lid/conjunctival changes, extraocular motion intact, ptosis bilaterally,vision grossly normal Neck: Normal range of motion, thyroid normal Respiratory: No increased work of breathing or abnormal breath sounds Cardiac : regular rhythm, no extra heart sounds, gallop, murmur. S4 Abdomen: No organomegaly ,masses, bruits or aortic enlargement. Dullness RUQ Lymph: No lymphadenopathy of the neck or axilla Skin: No rashes, lesions, ulcers or ischemic changes Muscle skeletal: no nail changes; joints normal Vasc:All pulses intact, no bruits present Neuro: Normal deep tendon reflexes, alert & oriented, sensation over feet normal Psych: judgment and insight, mood and affect normal        Assessment & Plan:  See Current Assessment & Plan in Problem List under specific Diagnosis

## 2012-10-26 LAB — TSH: TSH: 2.9 u[IU]/mL (ref 0.35–5.50)

## 2012-10-26 LAB — BASIC METABOLIC PANEL
CO2: 27 mEq/L (ref 19–32)
Calcium: 9.5 mg/dL (ref 8.4–10.5)
Glucose, Bld: 123 mg/dL — ABNORMAL HIGH (ref 70–99)
Potassium: 4.2 mEq/L (ref 3.5–5.1)
Sodium: 139 mEq/L (ref 135–145)

## 2012-10-26 LAB — HEPATIC FUNCTION PANEL
AST: 17 U/L (ref 0–37)
Albumin: 3.9 g/dL (ref 3.5–5.2)
Alkaline Phosphatase: 59 U/L (ref 39–117)
Bilirubin, Direct: 0 mg/dL (ref 0.0–0.3)
Total Protein: 7.2 g/dL (ref 6.0–8.3)

## 2012-10-26 LAB — LIPID PANEL
HDL: 61.3 mg/dL (ref >39.00)
Total CHOL/HDL Ratio: 3
Triglycerides: 167 mg/dL — ABNORMAL HIGH (ref 0.0–149.0)
VLDL: 33.4 mg/dL (ref 0.0–40.0)

## 2012-10-26 LAB — CK: Total CK: 45 U/L (ref 7–177)

## 2012-10-27 ENCOUNTER — Telehealth: Payer: Self-pay | Admitting: Nurse Practitioner

## 2012-10-27 NOTE — Telephone Encounter (Signed)
TC to patient. Had to leave a message. Her VPA level is 50, lowest end of normal. She has cut back her Depakote to 1 cap daily. I am concerned she may have a seizure.

## 2012-10-29 LAB — VITAMIN D 1,25 DIHYDROXY
Vitamin D2 1, 25 (OH)2: <8 pg/mL
Vitamin D3 1, 25 (OH)2: 41 pg/mL

## 2012-10-30 ENCOUNTER — Telehealth: Payer: Self-pay | Admitting: Nurse Practitioner

## 2012-10-30 MED ORDER — DIVALPROEX SODIUM ER 500 MG PO TB24: ORAL_TABLET | ORAL | Status: DC

## 2012-10-30 NOTE — Telephone Encounter (Signed)
Called patient. She will increase to 1 am, 2 hs, changed in medications and new refill sent

## 2012-10-30 NOTE — Telephone Encounter (Signed)
Once again called patient, left message about Depakote level and need to take Depakte 500mg  BID. Call for further questions.

## 2012-11-08 ENCOUNTER — Ambulatory Visit (HOSPITAL_COMMUNITY): Payer: BC Managed Care – PPO

## 2012-11-22 ENCOUNTER — Other Ambulatory Visit: Payer: Self-pay | Admitting: Neurology

## 2012-11-24 ENCOUNTER — Other Ambulatory Visit: Payer: Self-pay | Admitting: Internal Medicine

## 2012-11-24 NOTE — Telephone Encounter (Signed)
Simvastatin refill sent to pharmacy 

## 2012-12-04 ENCOUNTER — Other Ambulatory Visit: Payer: Self-pay | Admitting: Internal Medicine

## 2012-12-04 NOTE — Telephone Encounter (Signed)
Janumet refilled per protocol

## 2012-12-11 ENCOUNTER — Other Ambulatory Visit: Payer: Self-pay | Admitting: Cardiovascular Disease

## 2012-12-21 ENCOUNTER — Telehealth: Payer: Self-pay | Admitting: Nurse Practitioner

## 2012-12-21 NOTE — Telephone Encounter (Signed)
Wants nurse or provider to call her back. She has some questions about a medication change

## 2012-12-21 NOTE — Telephone Encounter (Signed)
Pharmacy needs clarification of new dosage of depacote(1 in am, 2 at night)

## 2012-12-22 NOTE — Telephone Encounter (Signed)
I called the pharmacy.  Spoke with [employer].  She said they filled the old Depakote Rx of 2 hs.  I advised her we sent them a new Rx for 1 in am and 2 hs in October.  She said they did get that Rx, and it is on file.  Since the wrong dose was filled, she said they will call the patient to explain and get it corrected.

## 2013-01-24 ENCOUNTER — Encounter: Payer: Self-pay | Admitting: Gastroenterology

## 2013-01-24 ENCOUNTER — Other Ambulatory Visit: Payer: Self-pay | Admitting: Internal Medicine

## 2013-01-24 NOTE — Telephone Encounter (Signed)
Novofine insulin pen needles refilled per protocol. JG//CMA

## 2013-01-25 ENCOUNTER — Telehealth: Payer: Self-pay | Admitting: *Deleted

## 2013-01-25 NOTE — Telephone Encounter (Signed)
Patient called and stated that her pharmacy have sent numerous of request to refill her [place] 80 MG/3ML SOPN   Pharmacy  [id number] - [place], [place]

## 2013-01-26 MED ORDER — LIRAGLUTIDE 18 MG/3ML ~~LOC~~ SOPN: PEN_INJECTOR | SUBCUTANEOUS | Status: DC

## 2013-01-26 NOTE — Telephone Encounter (Signed)
Rx sent to the pharmacy by e-script.//AB/CMA 

## 2013-02-14 ENCOUNTER — Other Ambulatory Visit: Payer: Self-pay | Admitting: *Deleted

## 2013-02-14 ENCOUNTER — Telehealth: Payer: Self-pay | Admitting: *Deleted

## 2013-02-14 ENCOUNTER — Encounter: Payer: Self-pay | Admitting: Gastroenterology

## 2013-02-14 MED ORDER — LIRAGLUTIDE 18 MG/3ML ~~LOC~~ SOPN: PEN_INJECTOR | SUBCUTANEOUS | Status: DC

## 2013-02-14 NOTE — Telephone Encounter (Signed)
[facility] and has question about pt's rx Liraglutide (VICTOZA) 18 MG/3ML SOPN. Patient states that she takes a higher dosage.

## 2013-02-14 NOTE — Telephone Encounter (Signed)
Called patient and verified Victoza dose. Patient states she takes 1.8 ml daily. Script sent to pharmacy. JG//CMA

## 2013-03-04 ENCOUNTER — Encounter (HOSPITAL_COMMUNITY): Payer: Self-pay | Admitting: Emergency Medicine

## 2013-03-04 ENCOUNTER — Emergency Department (HOSPITAL_COMMUNITY): Payer: BC Managed Care – PPO

## 2013-03-04 ENCOUNTER — Emergency Department (HOSPITAL_COMMUNITY)
Admission: EM | Admit: 2013-03-04 | Discharge: 2013-03-04 | Disposition: A | Discharge status: Home / Self Care | Payer: BC Managed Care – PPO | Attending: Emergency Medicine | Admitting: Emergency Medicine

## 2013-03-04 DIAGNOSIS — F329 Major depressive disorder, single episode, unspecified: Secondary | ICD-10-CM | POA: Insufficient documentation

## 2013-03-04 DIAGNOSIS — R0602 Shortness of breath: Secondary | ICD-10-CM | POA: Insufficient documentation

## 2013-03-04 DIAGNOSIS — K589 Irritable bowel syndrome without diarrhea: Secondary | ICD-10-CM | POA: Insufficient documentation

## 2013-03-04 DIAGNOSIS — Z862 Personal history of diseases of the blood and blood-forming organs and certain disorders involving the immune mechanism: Secondary | ICD-10-CM | POA: Insufficient documentation

## 2013-03-04 DIAGNOSIS — E039 Hypothyroidism, unspecified: Secondary | ICD-10-CM | POA: Insufficient documentation

## 2013-03-04 DIAGNOSIS — E785 Hyperlipidemia, unspecified: Secondary | ICD-10-CM | POA: Insufficient documentation

## 2013-03-04 DIAGNOSIS — IMO0002 Reserved for concepts with insufficient information to code with codable children: Secondary | ICD-10-CM | POA: Insufficient documentation

## 2013-03-04 DIAGNOSIS — E119 Type 2 diabetes mellitus without complications: Secondary | ICD-10-CM | POA: Insufficient documentation

## 2013-03-04 DIAGNOSIS — G40909 Epilepsy, unspecified, not intractable, without status epilepticus: Secondary | ICD-10-CM | POA: Insufficient documentation

## 2013-03-04 DIAGNOSIS — R450 Nervousness: Secondary | ICD-10-CM | POA: Insufficient documentation

## 2013-03-04 DIAGNOSIS — R079 Chest pain, unspecified: Secondary | ICD-10-CM | POA: Insufficient documentation

## 2013-03-04 DIAGNOSIS — Z860100 Personal history of colon polyps, unspecified: Secondary | ICD-10-CM | POA: Insufficient documentation

## 2013-03-04 DIAGNOSIS — Z791 Long term (current) use of non-steroidal anti-inflammatories (NSAID): Secondary | ICD-10-CM | POA: Insufficient documentation

## 2013-03-04 DIAGNOSIS — F411 Generalized anxiety disorder: Secondary | ICD-10-CM | POA: Insufficient documentation

## 2013-03-04 DIAGNOSIS — M129 Arthropathy, unspecified: Secondary | ICD-10-CM | POA: Insufficient documentation

## 2013-03-04 DIAGNOSIS — I10 Essential (primary) hypertension: Secondary | ICD-10-CM | POA: Insufficient documentation

## 2013-03-04 DIAGNOSIS — M549 Dorsalgia, unspecified: Secondary | ICD-10-CM

## 2013-03-04 DIAGNOSIS — I252 Old myocardial infarction: Secondary | ICD-10-CM | POA: Insufficient documentation

## 2013-03-04 DIAGNOSIS — F3289 Other specified depressive episodes: Secondary | ICD-10-CM | POA: Insufficient documentation

## 2013-03-04 DIAGNOSIS — G4733 Obstructive sleep apnea (adult) (pediatric): Secondary | ICD-10-CM | POA: Insufficient documentation

## 2013-03-04 DIAGNOSIS — Z79899 Other long term (current) drug therapy: Secondary | ICD-10-CM | POA: Insufficient documentation

## 2013-03-04 DIAGNOSIS — Z7982 Long term (current) use of aspirin: Secondary | ICD-10-CM | POA: Insufficient documentation

## 2013-03-04 LAB — BASIC METABOLIC PANEL
BUN: 6 mg/dL (ref 6–23)
CHLORIDE: 94 meq/L — AB (ref 96–112)
CO2: 22 mEq/L (ref 19–32)
Calcium: 9.4 mg/dL (ref 8.4–10.5)
Creatinine, Ser: 0.53 mg/dL (ref 0.50–1.10)
GFR calc non Af Amer: >90 mL/min (ref >90)
Glucose, Bld: 204 mg/dL — ABNORMAL HIGH (ref 70–99)
Potassium: 3.6 mEq/L — ABNORMAL LOW (ref 3.7–5.3)
Sodium: 135 mEq/L — ABNORMAL LOW (ref 137–147)

## 2013-03-04 LAB — CBC
HEMATOCRIT: 37.1 % (ref 36.0–46.0)
Hemoglobin: 13 g/dL (ref 12.0–15.0)
MCH: 28.7 pg (ref 26.0–34.0)
MCHC: 35 g/dL (ref 30.0–36.0)
MCV: 81.9 fL (ref 78.0–100.0)
PLATELETS: 184 10*3/uL (ref 150–400)
RBC: 4.53 MIL/uL (ref 3.87–5.11)
RDW: 14.1 % (ref 11.5–15.5)
WBC: 5.1 10*3/uL (ref 4.0–10.5)

## 2013-03-04 LAB — GLUCOSE, CAPILLARY: Glucose-Capillary: 157 mg/dL — ABNORMAL HIGH (ref 70–99)

## 2013-03-04 LAB — D-DIMER, QUANTITATIVE (NOT AT ARMC)

## 2013-03-04 LAB — POCT I-STAT TROPONIN I: TROPONIN I, POC: 0 ng/mL (ref 0.00–0.08)

## 2013-03-04 LAB — TROPONIN I
Troponin I: <0.3 ng/mL (ref <0.30)
Troponin I: <0.3 ng/mL (ref <0.30)

## 2013-03-04 MED ORDER — IOHEXOL 350 MG/ML SOLN
100.0000 mL | Freq: Once | INTRAVENOUS | Status: AC | PRN
  Administered 2013-03-04: 100 mL via INTRAVENOUS

## 2013-03-04 MED ORDER — HYDROCODONE-ACETAMINOPHEN 5-325 MG PO TABS: 2.0000 | ORAL_TABLET | ORAL | Status: DC | PRN

## 2013-03-04 MED ORDER — ONDANSETRON HCL 4 MG/2ML IJ SOLN
4.0000 mg | Freq: Once | INTRAMUSCULAR | Status: AC
  Administered 2013-03-04: 4 mg via INTRAVENOUS
  Filled 2013-03-04: qty 2

## 2013-03-04 MED ORDER — NAPROXEN 500 MG PO TABS: 500.0000 mg | ORAL_TABLET | Freq: Two times a day (BID) | ORAL | Status: DC

## 2013-03-04 MED ORDER — KETOROLAC TROMETHAMINE 30 MG/ML IJ SOLN
30.0000 mg | Freq: Once | INTRAMUSCULAR | Status: AC
  Administered 2013-03-04: 30 mg via INTRAVENOUS
  Filled 2013-03-04: qty 1

## 2013-03-04 MED ORDER — METHOCARBAMOL 500 MG PO TABS: 500.0000 mg | ORAL_TABLET | Freq: Three times a day (TID) | ORAL | Status: DC | PRN

## 2013-03-04 MED ORDER — MORPHINE SULFATE 4 MG/ML IJ SOLN: 4.0000 mg | INTRAMUSCULAR | Status: DC | PRN

## 2013-03-04 MED ORDER — MORPHINE SULFATE 2 MG/ML IJ SOLN
2.0000 mg | INTRAMUSCULAR | Status: DC | PRN
  Administered 2013-03-04 (×2): 2 mg via INTRAVENOUS
  Filled 2013-03-04 (×2): qty 1

## 2013-03-04 NOTE — Discharge Instructions (Signed)
Recheck here if you have any change or worsening or symptoms at home. Keep your cardiologist appointment this Thursday.  Metformin and X-ray Contrast Studies For some X-ray exams, a contrast dye is used. Contrast dye is a type of medicine used to make the X-ray image clearer. The contrast dye is given to the patient through a vein (intravenously). If you need to have this type of X-ray exam and you take a medication called metformin, your caregiver may have you stop taking metformin before the exam.  LACTIC ACIDOSIS In rare cases, a serious medical condition called lactic acidosis can develop in people who take metformin and receive contrast dye. The following conditions can increase the risk of this complication:   Kidney failure.  Liver problems.  Certain types of heart problems such as:  Heart failure.  Heart attack.  Heart infection.  Heart valve problems.  Alcohol abuse. If left untreated, lactic acidosis can lead to coma.  SYMPTOMS OF LACTIC ACIDOSIS Symptoms of lactic acidosis can include:  Rapid breathing (hyperventilation).  Neurologic symptoms such as:  Headaches.  Confusion.  Dizziness.  Excessive sweating.  Feeling sick to your stomach (nauseous) or throwing up (vomiting). AFTER THE X-RAY EXAM  Stay well-hydrated. Drink fluids as instructed by your caregiver.  If you have a risk of developing lactic acidosis, blood tests may be done to make sure your kidney function is okay.  Metformin is usually stopped for 48 hours after the X-ray exam. Ask your caregiver when you can start taking metformin again. SEEK MEDICAL CARE IF:   You have shortness of breath or difficulty breathing.  You develop a headache that does not go away.  You have nausea or vomiting.  You urinate more than normal.  You develop a skin rash and have:  Redness.  Swelling.  Itching. Document Released: 12/23/2008 Document Revised: 03/29/2011 Document Reviewed:  12/23/2008 [facility] Patient Information 2014 [place], [place].    Back Pain, Adult Back pain is very common. The pain often gets better over time. The cause of back pain is usually not dangerous. Most people can learn to manage their back pain on their own.  HOME CARE   Stay active. Start with short walks on flat ground if you can. Try to walk farther each day.  Do not sit, drive, or stand in one place for more than 30 minutes. Do not stay in bed.  Do not avoid exercise or work. Activity can help your back heal faster.  Be careful when you bend or lift an object. Bend at your knees, keep the object close to you, and do not twist.  Sleep on a firm mattress. Lie on your side, and bend your knees. If you lie on your back, put a pillow under your knees.  Only take medicines as told by your doctor.  Put ice on the injured area.  Put ice in a plastic bag.  Place a towel between your skin and the bag.  Leave the ice on for 15-20 minutes, 03-04 times a day for the first 2 to 3 days. After that, you can switch between ice and heat packs.  Ask your doctor about back exercises or massage.  Avoid feeling anxious or stressed. Find good ways to deal with stress, such as exercise. GET HELP RIGHT AWAY IF:   Your pain does not go away with rest or medicine.  Your pain does not go away in 1 week.  You have new problems.  You do not feel well.  The  pain spreads into your legs.  You cannot control when you poop (bowel movement) or pee (urinate).  Your arms or legs feel weak or lose feeling (numbness).  You feel sick to your stomach (nauseous) or throw up (vomit).  You have belly (abdominal) pain.  You feel like you may pass out (faint). MAKE SURE YOU:   Understand these instructions.  Will watch your condition.  Will get help right away if you are not doing well or get worse. Document Released: 06/23/2007 Document Revised: 03/29/2011 Document Reviewed: 05/25/2010 [facility]  Patient Information 2014 [place].

## 2013-03-04 NOTE — ED Notes (Signed)
Patient now to ct.  Remains alert and oriented.  She was resting prior to transport.  Family remains at bedside.  Pain is 5/10

## 2013-03-04 NOTE — ED Notes (Signed)
Patient continues to have pain,  5/10.  ermd at bedside.  Patient to have ct scan and troponin to be done in approx 1 hour to re-check her troponin

## 2013-03-04 NOTE — ED Notes (Signed)
CBG 157 

## 2013-03-04 NOTE — ED Notes (Signed)
Patient is ready for discharge home.  No s/sx of distress.  Family at bedside and aware of plan of care

## 2013-03-04 NOTE — ED Notes (Signed)
Patient reports she woke up at 0500 with sob and left shoulder pain/pressure.  Patient was found by ems to have wheezing.  Albuterol treatment given 5mg .  Patient did take apsirin 4x 81 mg aspirin.  Patient does have hx of mi.  She states this pain is different.  Patient cbg 189.  Patient has had hx of cough recently.  Patient with no reported fever.  98.1 per ems.  Patient did take all of her morning meds prior to transport.  Patient current pain is 5/10

## 2013-03-04 NOTE — ED Notes (Signed)
Patient and family verbalized understanding of discharge instructions.  She has been encouraged to return if her sx worsen or new sx develope

## 2013-03-04 NOTE — ED Provider Notes (Signed)
CSN: [id number]     Arrival date & time 03/04/13  0727 History   First MD Initiated Contact with Patient 03/04/13 [phone]     Chief Complaint  Patient presents with  . Shortness of Breath  . Shoulder Pain     HPI  She waking this morning with some pain in her back. She moved around more painful than images going to get her bed she admits that she got anxious. She started breathing hard and fast to make her pain worse. She called paramedics. She was brought here by ambulance. She was given a nebulized albuterol treatment. She states she feels anxious but does not feel like her pain is any better he does not really feel short of breath. She is crying upon her arrival here. History of MI found after an episode of pain 7 years ago. Also cardiology, Dr. Burt Knack every year. Normal mild scans yearly for the first 3 years after her MI. Yearly visit since then. Her pain today is between her shoulder blades. Hurts to cough and breathe or move. No arm pain, note anterior chest pain, no neck or jaw pain. No nausea. No GI complaints. No recent cough sputum production difficulty breathing. No history of DVT or PE. No recent prolonged immobilization cast splints fractures surgeries malignancies or other DVT or PE risks.  Past Medical History  Diagnosis Date  . Depression   . Seizure disorder   . Dyslipidemia   . Arthritis   . OSA (obstructive sleep apnea)   . Obesity   . Hiatal hernia   . Hemangioma   . Hx of adenomatous colonic polyps   . Myocardial infarct     subendocardrial, following R TKR  . GERD (gastroesophageal reflux disease)   . Gastritis   . Diabetes mellitus   . Hypothyroidism 2010    Dr.Deveshawr  . IBS (irritable bowel syndrome)     Dr Fuller Plan  . Anemia   . Hyperlipidemia   . Hypertension   . Colon polyp     adenomatous   Past Surgical History  Procedure Laterality Date  . Cholecystectomy  1985  . Abdominal hysterectomy  1988  . Tubal ligation  1986  . Total knee arthroplasty   11/2005  . Cardiac catheterization  2007    DrGamble ( now seeing Dr Tonita Cong cardiology)  . Total knee arthroplasty      left  . Steroid injection to left si joint  12/2008    Dr.Newton  . Polypectomy  2012    6 or 7 adenomatous, Dr.Stark   Family History  Problem Relation Age of Onset  . Colon cancer Neg Hx   . Heart attack Neg Hx   . Stroke Neg Hx   . Cancer Neg Hx   . Colon polyps Brother   . Colon polyps Mother   . Diabetes Sister   . Diabetes Paternal Aunt   . Ulcers Father    History  Substance Use Topics  . Smoking status: Never Smoker   . Smokeless tobacco: Never Used  . Alcohol Use: No   OB History   Grav Para Term Preterm Abortions TAB SAB Ect Mult Living                 Review of Systems  Constitutional: Negative for fever, chills, diaphoresis, appetite change and fatigue.  HENT: Negative for mouth sores, sore throat and trouble swallowing.   Eyes: Negative for visual disturbance.  Respiratory: Positive for shortness of breath. Negative for cough,  chest tightness and wheezing.   Cardiovascular: Positive for chest pain.  Gastrointestinal: Negative for nausea, vomiting, abdominal pain, diarrhea and abdominal distention.  Endocrine: Negative for polydipsia, polyphagia and polyuria.  Genitourinary: Negative for dysuria, frequency and hematuria.  Musculoskeletal: Positive for back pain. Negative for gait problem.  Skin: Negative for color change, pallor and rash.  Neurological: Negative for dizziness, syncope, light-headedness and headaches.  Hematological: Does not bruise/bleed easily.  Psychiatric/Behavioral: Negative for behavioral problems and confusion. The patient is nervous/anxious.       Allergies  Review of patient's allergies indicates no known allergies.  Home Medications   Current Outpatient Rx  Name  Route  Sig  Dispense  Refill  . aspirin 81 MG tablet   Oral   Take 81 mg by mouth daily.           . citalopram (CELEXA) 20 MG  tablet   Oral   Take 20 mg by mouth daily.         . divalproex (DEPAKOTE ER) 500 MG 24 hr tablet   Oral   Take 500-1,000 mg by mouth 2 (two) times daily. 1 in the am 2 at hs         . fluticasone (FLONASE) 50 MCG/ACT nasal spray   Each Nare   Place 1-2 sprays into both nostrils daily as needed for allergies or rhinitis.          . Fluticasone-Salmeterol (ADVAIR) 250-50 MCG/DOSE AEPB   Inhalation   Inhale 1 puff into the lungs 2 (two) times daily as needed (seasonal allergies).         [name] glucose blood (ONE TOUCH TEST STRIPS) test strip      1 each by Other route. (Ultra Blue Test Strips) Check blood sugar twice daily DX 250.02   200 each   3   . Ibuprofen-Diphenhydramine Cit (IBUPROFEN PM PO)   Oral   Take 1 tablet by mouth at bedtime as needed (pain, sleep).         [name] levothyroxine (SYNTHROID, LEVOTHROID) 25 MCG tablet   Oral   Take 25 mcg by mouth daily before breakfast.         . Liraglutide 18 MG/3ML SOPN   Subcutaneous   Inject 1.8 mLs into the skin daily.         [name] loperamide (IMODIUM A-D) 2 MG tablet   Oral   Take 2 mg by mouth as needed.           [name] losartan (COZAAR) 50 MG tablet   Oral   Take 50 mg by mouth daily.         [name] NOVOFINE 32G X 6 MM MISC      USE DAILY WITH PENS AS DIRECTED   100 each   3   . simvastatin (ZOCOR) 20 MG tablet   Oral   Take 20 mg by mouth at bedtime.         . sitaGLIPtin-metformin (JANUMET) 50-500 MG per tablet   Oral   Take 1 tablet by mouth 2 (two) times daily with a meal.         . HYDROcodone-acetaminophen (NORCO/VICODIN) 5-325 MG per tablet   Oral   Take 2 tablets by mouth every 4 (four) hours as needed.   10 tablet   0   . methocarbamol (ROBAXIN) 500 MG tablet   Oral   Take 1 tablet (500 mg total) by mouth 3 (three) times daily between meals as needed.  20 tablet   0   . naproxen (NAPROSYN) 500 MG tablet   Oral   Take 1 tablet (500 mg total) by mouth 2 (two) times daily.   30  tablet   0    BP 119/52  Pulse 82  Temp(Src) 97.6 F (36.4 C) (Oral)  Resp 10  Ht 5\' 3"  (1.6 m)  Wt 300 lb (136.079 kg)  BMI 53.16 kg/m2  SpO2 97% Physical Exam  Constitutional: She is oriented to person, place, and time. She appears well-developed and well-nourished. No distress.  HENT:  Head: Normocephalic.  Mouth/Throat: Oropharynx is clear and moist. Mucous membranes are not pale and not dry.  Eyes: Conjunctivae are normal. Pupils are equal, round, and reactive to light. No scleral icterus.  Anicteric.  Not pale in the conjunctiva.  Neck: Normal range of motion. Neck supple. No JVD present. No thyromegaly present.  Cardiovascular: Normal rate and regular rhythm.   No extrasystoles are present. Exam reveals no gallop and no friction rub.   No murmur heard. Pulmonary/Chest: Effort normal and breath sounds normal. No respiratory distress. She has no decreased breath sounds. She has no wheezes. She has no rhonchi. She has no rales.  No rash across the back no skin sensitivity.  Normal breath sounds.  Abdominal: Soft. Bowel sounds are normal. She exhibits no distension. There is no tenderness. There is no rebound.  Musculoskeletal: Normal range of motion.  Neurological: She is alert and oriented to person, place, and time.  Skin: Skin is warm and dry. No rash noted.  Psychiatric: She has a normal mood and affect. Her behavior is normal.    ED Course  Procedures (including critical care time) Labs Review Labs Reviewed  BASIC METABOLIC PANEL - Abnormal; Notable for the following:    Sodium 135 (*)    Potassium 3.6 (*)    Chloride 94 (*)    Glucose, Bld 204 (*)    All other components within normal limits  GLUCOSE, CAPILLARY - Abnormal; Notable for the following:    Glucose-Capillary 157 (*)    All other components within normal limits  CBC  D-DIMER, QUANTITATIVE  TROPONIN I  TROPONIN I  POCT I-STAT TROPONIN I   Imaging Review Dg Chest 2 View  03/04/2013   CLINICAL  DATA:  Shortness of breath.  EXAM: CHEST  2 VIEW  COMPARISON:  02/14/2011  FINDINGS: Lungs are clear. Cardiomediastinal silhouette and remainder of the exam is unchanged.  IMPRESSION: No active cardiopulmonary disease.   Electronically Signed   By: Elberta Fortis M.D.   On: 03/04/2013 08:27   Ct Angio Chest Aorta W/cm &/or Wo/cm  03/04/2013   CLINICAL DATA:  Left-sided shoulder pain and pressure, wheezing, evaluate for thoracic aortic dissection  EXAM: CT ANGIOGRAPHY CHEST WITH CONTRAST  TECHNIQUE: Multidetector CT imaging of the chest was performed using the standard protocol during bolus administration of intravenous contrast. Multiplanar CT image reconstructions and MIPs were obtained to evaluate the vascular anatomy.  CONTRAST:  OMNIPAQUE IOHEXOL 350 MG/ML SOLN  COMPARISON:  CT ANGIO CHEST dated 12/09/2005; CT ANGIO CHEST dated 12/02/2005; MR ABDOMEN WO/W CM dated 08/09/2009  FINDINGS: Vascular Findings:  Normal caliber of the thoracic aorta with measurements as follows. Evaluation of the ascending thoracic aorta is degraded due to pulsation artifact. No definite evidence of thoracic aortic dissection or periaortic stranding. Review of the precontrast images are negative for the presence of an intramural hematoma.  Bovine configuration of the aortic arch is incidentally noted. Wall  of the proximal aspects of the major branch vessels of the aorta are noted to be tortuous, they remain widely patent throughout their imaged course.  Borderline cardiomegaly. Trace amount of pericardial fluid, presumably physiologic.  While this examination was not tailored for the evaluation of the pulmonary arteries, there are no discrete filling defects within the pulmonary arterial tree. Normal caliber the main pulmonary artery.  -------------------------------------------------------------  Thoracic aortic measurements:  Sinotubular junction  30 mm as measured in greatest oblique coronal dimension.  Proximal ascending  aorta  35 mm as measured in greatest oblique axial dimension at the level of the main pulmonary artery. Approximately 36 mm in greatest oblique caudal dimension.  Aortic arch aorta  27 mm in greatest oblique axial dimension. Approximately 25 mm as measured in greatest oblique sagittal dimension.  Proximal descending thoracic aorta  29 mm as measured in greatest oblique axial dimension at the level of the main pulmonary artery.  Distal descending thoracic aorta  25 mm as measured in greatest oblique axial dimension at the level of the diaphragmatic hiatus.  Review of the MIP images confirms the above findings.  -------------------------------------------------------------  Non-Vascular Findings:  There is minimal grossly symmetric dependent ground-glass atelectasis. No discrete focal airspace opacities. No pleural effusion or pneumothorax. The central pulmonary airways appear widely patent. No discrete pulmonary nodules. No mediastinal, hilar or axillary lymphadenopathy.  Limited early arterial phase evaluation of the upper abdomen in demonstrates diffuse decreased attenuation of the hepatic parenchyma suggestive of hepatic steatosis. Re-demonstrated is a contour abnormality involving the caudal aspect of the right lobe of the liver previously characterized as a cluster of hepatic hemangiomas on prior abdominal MRI, incompletely evaluated on the present examination. Post cholecystectomy.  No acute or aggressive osseous abnormalities. Regional soft tissues appear normal.  IMPRESSION: 1. No evidence of thoracic aortic dissection or aneurysm. No evidence of pulmonary embolism. 2. Borderline cardiomegaly. 3. Hepatic steatosis. 4. Contour or abnormality involving the caudal aspect of the right lobe of the liver at the location of known hepatic hemangiomas demonstrated on prior abdominal MRI.   Electronically Signed   By: Sandi Mariscal M.D.   On: 03/04/2013 11:22    EKG Interpretation    Date/Time:  Sunday March 04 2013 07:34:08 EST Ventricular Rate:  91 PR Interval:  204 QRS Duration: 83 QT Interval:  368 QTC Calculation: 453 R Axis:   87 Text Interpretation:  Sinus Rhythm Ventricular premature complex Borderline prolonged PR interval Confirmed by Jeneen Rinks  MD, [place] ([id number]) on 03/04/2013 1:04:42 PM            MDM   Final diagnoses:  Back pain    Enzymes normal after 8 hours of continuous pain. EKG shows no ischemic changes or injury. Normal aorta and pulmonary arteries by CT angiogram. Normal d-dimer. Normal plain chest x-ray. Structurally normal lungs per CT imaging. I think she is appropriate for outpatient treatment for simple musculoskeletal/pleuritic back and chest pain. Anti-inflammatories, pain medicine, muscle relaxants. I sat and discussed with her in the presence of her brother and family that if she has any pain is reminiscent of her heart disease, fevers, or worsening or change in symptoms or recurrence of symptoms not discussed like a recheck. She expressed understanding. She is comfortable and desirous of being discharged.    Tanna Furry, MD 03/04/13 [phone]

## 2013-03-04 NOTE — ED Notes (Signed)
Patient states she is breathing better.  She continues to have mid back pain.  Repeat troponin has been collected.  Additional pain meds given.  She remains on cardiac monitoring.  sr 78

## 2013-03-04 NOTE — ED Notes (Signed)
Patient states she is not feeling well.  States she feels a little disoriented.  Patient also reports her back pain has increased.  No chemistry results noted on chart.  Call to lab to inquire.  Report no specimen.  There was a light green tube sent with labs.

## 2013-03-15 ENCOUNTER — Other Ambulatory Visit: Payer: Self-pay | Admitting: Cardiovascular Disease

## 2013-03-22 ENCOUNTER — Ambulatory Visit: Payer: BC Managed Care – PPO | Admitting: Cardiovascular Disease

## 2013-04-06 ENCOUNTER — Encounter: Payer: Self-pay | Admitting: Cardiovascular Disease

## 2013-04-06 ENCOUNTER — Ambulatory Visit (INDEPENDENT_AMBULATORY_CARE_PROVIDER_SITE_OTHER): Payer: BC Managed Care – PPO | Admitting: Cardiovascular Disease

## 2013-04-06 VITALS — BP 148/76 | HR 83 | Ht 63.0 in | Wt 312.0 lb

## 2013-04-06 DIAGNOSIS — R0602 Shortness of breath: Secondary | ICD-10-CM

## 2013-04-06 DIAGNOSIS — E785 Hyperlipidemia, unspecified: Secondary | ICD-10-CM

## 2013-04-06 DIAGNOSIS — I214 Non-ST elevation (NSTEMI) myocardial infarction: Secondary | ICD-10-CM

## 2013-04-06 DIAGNOSIS — I10 Essential (primary) hypertension: Secondary | ICD-10-CM

## 2013-04-06 MED ORDER — FUROSEMIDE 20 MG PO TABS: 20.0000 mg | ORAL_TABLET | Freq: Every day | ORAL | Status: DC

## 2013-04-06 MED ORDER — POTASSIUM CHLORIDE ER 10 MEQ PO TBCR: 10.0000 meq | EXTENDED_RELEASE_TABLET | Freq: Every day | ORAL | Status: DC

## 2013-04-06 NOTE — Patient Instructions (Addendum)
Your physician has requested that you have an echocardiogram. Echocardiography is a painless test that uses sound waves to create images of your heart. It provides your doctor with information about the size and shape of your heart and how well your heart's chambers and valves are working. This procedure takes approximately one hour. There are no restrictions for this procedure.  Your physician has recommended you make the following change in your medication:  START Lasix (Furosemide) 20 mg once daily START KDur (potassium supplement) 10 mEq once daily  Your physician recommends that you return for lab work in: 2 weeks (Monday April 6, anytime between 7:30 am and 4:30 pm) - basic metabolic panel - you do not have to fast for this appointment  Your physician wants you to follow-up in: 1 year with Dr. Burt Knack.  You will receive a reminder letter in the mail two months in advance. If you don't receive a letter, please call our office to schedule the follow-up appointment.

## 2013-04-06 NOTE — Progress Notes (Signed)
HPI:  63 year old woman presenting for follow-up cardiac evaluation. The patient has several cardiovascular risk factors, including type 2 diabetes, hypertension, and hyperlipidemia. She also has a remote history of paroxysmal atrial fibrillation. Last lipids 10/2012:  Lipid Panel     Component Value Date/Time   CHOL 185 10/25/2012 1210   TRIG 167.0* 10/25/2012 1210   HDL 61.30 10/25/2012 1210   CHOLHDL 3 10/25/2012 1210   VLDL 33.4 10/25/2012 1210   LDLCALC 90 10/25/2012 1210   Dyspnea continues to be her primary cardiac complaint. She also has mild edema. No orthopnea, PND, or lightheadedness/syncope. She's not physically active.     Outpatient Encounter Prescriptions as of 04/06/2013  Medication Sig  . aspirin 81 MG tablet Take 81 mg by mouth daily.    . divalproex (DEPAKOTE ER) 500 MG 24 hr tablet Take 500-1,000 mg by mouth 2 (two) times daily. 1 in the am 2 at hs  . fluticasone (FLONASE) 50 MCG/ACT nasal spray Place 1-2 sprays into both nostrils daily as needed for allergies or rhinitis.   . Fluticasone-Salmeterol (ADVAIR) 250-50 MCG/DOSE AEPB Inhale 1 puff into the lungs 2 (two) times daily as needed (seasonal allergies).  [name] glucose blood (ONE TOUCH TEST STRIPS) test strip 1 each by Other route. (Ultra Blue Test Strips) Check blood sugar twice daily DX 250.02  . HYDROcodone-acetaminophen (NORCO/VICODIN) 5-325 MG per tablet Take 2 tablets by mouth every 4 (four) hours as needed.  . Ibuprofen-Diphenhydramine Cit (IBUPROFEN PM PO) Take 1 tablet by mouth at bedtime as needed (pain, sleep).  [name] levothyroxine (SYNTHROID, LEVOTHROID) 25 MCG tablet Take 25 mcg by mouth daily before breakfast.  . Liraglutide 18 MG/3ML SOPN Inject 1.8 mLs into the skin daily.  [name] loperamide (IMODIUM A-D) 2 MG tablet Take 2 mg by mouth as needed.    [name] losartan (COZAAR) 50 MG tablet TAKE 1 TABLET (50 MG TOTAL) BY MOUTH DAILY.  . methocarbamol (ROBAXIN) 500 MG tablet Take 1 tablet (500 mg total) by mouth 3 (three)  times daily between meals as needed.  . naproxen (NAPROSYN) 500 MG tablet Take 1 tablet (500 mg total) by mouth 2 (two) times daily.  [name] NOVOFINE 32G X 6 MM MISC USE DAILY WITH PENS AS DIRECTED  . simvastatin (ZOCOR) 20 MG tablet Take 20 mg by mouth at bedtime.  . sitaGLIPtin-metformin (JANUMET) 50-500 MG per tablet Take 1 tablet by mouth 2 (two) times daily with a meal.  . [DISCONTINUED] losartan (COZAAR) 50 MG tablet TAKE 1 TABLET (50 MG TOTAL) BY MOUTH DAILY. PT NEEDS YEARLY FOLLOW UP FOR FURTHER REFILLS  . [DISCONTINUED] citalopram (CELEXA) 20 MG tablet Take 20 mg by mouth daily.  . [DISCONTINUED] losartan (COZAAR) 50 MG tablet Take 50 mg by mouth daily.    No Known Allergies  Past Medical History  Diagnosis Date  . Depression   . Seizure disorder   . Dyslipidemia   . Arthritis   . OSA (obstructive sleep apnea)   . Obesity   . Hiatal hernia   . Hemangioma   . Hx of adenomatous colonic polyps   . Myocardial infarct     subendocardrial, following R TKR  . GERD (gastroesophageal reflux disease)   . Gastritis   . Diabetes mellitus   . Hypothyroidism 2010    Dr.Deveshawr  . IBS (irritable bowel syndrome)     Dr Fuller Plan  . Anemia   . Hyperlipidemia   . Hypertension   . Colon polyp     adenomatous  ROS: Negative except as per HPI  BP 148/76  Pulse 83  Ht 5\' 3"  (1.6 m)  Wt 312 lb (141.522 kg)  BMI 55.28 kg/m2  PHYSICAL EXAM: Pt is alert and oriented,morbidly obese woman in NAD HEENT: normal Neck: JVP - unable to visualize, carotids 2+= without bruits Lungs: CTA bilaterally CV: RRR without murmur or gallop Abd: soft, NT, Positive BS, no hepatomegaly Ext: 1+ pretibial edema, distal pulses intact and equal Skin: warm/dry no rash  EKG:  NSR with low voltage QRS. No significant ST-T changes.  ASSESSMENT AND PLAN: 1. Shortness of breath. Suspect multifactorial with significant component related to obesity. However, she's clearly at risk for diastolic CHF. Add lasix 20  mg daily and KDur 10 meq. Repeat BMET in 2-3 weeks. Check 2D Echo.  2. Obesity - extensive counseling done.   3. HTN - continue losartan  4. Hyperlipidemia - pt on low-dose simvasatin with last lipids showing LDL 90  I will see her back in one year unless echo shows significant abnormalities.  Sherren Mocha 04/09/2013 11:03 PM

## 2013-04-12 ENCOUNTER — Encounter: Payer: BC Managed Care – PPO | Admitting: Gastroenterology

## 2013-04-20 ENCOUNTER — Ambulatory Visit (HOSPITAL_COMMUNITY): Payer: BC Managed Care – PPO | Attending: Cardiovascular Disease | Admitting: Radiology

## 2013-04-20 ENCOUNTER — Encounter: Payer: Self-pay | Admitting: Cardiovascular Disease

## 2013-04-20 DIAGNOSIS — R0602 Shortness of breath: Secondary | ICD-10-CM

## 2013-04-20 DIAGNOSIS — I4891 Unspecified atrial fibrillation: Secondary | ICD-10-CM

## 2013-04-20 NOTE — Progress Notes (Signed)
Echocardiogram Performed. 

## 2013-04-23 ENCOUNTER — Other Ambulatory Visit (INDEPENDENT_AMBULATORY_CARE_PROVIDER_SITE_OTHER): Payer: BC Managed Care – PPO

## 2013-04-23 DIAGNOSIS — R0602 Shortness of breath: Secondary | ICD-10-CM

## 2013-04-23 DIAGNOSIS — I10 Essential (primary) hypertension: Secondary | ICD-10-CM

## 2013-04-23 DIAGNOSIS — E785 Hyperlipidemia, unspecified: Secondary | ICD-10-CM

## 2013-04-23 DIAGNOSIS — I214 Non-ST elevation (NSTEMI) myocardial infarction: Secondary | ICD-10-CM

## 2013-04-23 LAB — BASIC METABOLIC PANEL
BUN: 7 mg/dL (ref 6–23)
CHLORIDE: 103 meq/L (ref 96–112)
CO2: 30 meq/L (ref 19–32)
CREATININE: 0.5 mg/dL (ref 0.4–1.2)
Calcium: 9.1 mg/dL (ref 8.4–10.5)
GFR: 132.71 mL/min (ref >60.00)
GLUCOSE: 179 mg/dL — AB (ref 70–99)
POTASSIUM: 3.9 meq/L (ref 3.5–5.1)
Sodium: 140 mEq/L (ref 135–145)

## 2013-05-02 ENCOUNTER — Telehealth: Payer: Self-pay | Admitting: *Deleted

## 2013-05-02 NOTE — Telephone Encounter (Signed)
Pt scheduled for OV with Dr Fuller Plan 6/4.  Colonoscopy scheduled for 5/1 cancelled

## 2013-05-02 NOTE — Telephone Encounter (Signed)
Dr Fuller Plan: pt is scheduled for recall colon 05/18/13 and PV on Friday 4/17.  While doing chart prep I see that she has a BMI of 55.28 on 04/06/13.  Is  pt ok for direct hospital colon or does she need OV before scheduling colon?  Thanks, Juliann Pulse

## 2013-05-02 NOTE — Telephone Encounter (Signed)
Given her multiple medical problems she should have an office evaluation before scheduling. I would strongly consider changing her recall to 5 years instead of at 3 years as it is planned now.

## 2013-05-08 ENCOUNTER — Other Ambulatory Visit: Payer: Self-pay

## 2013-05-08 MED ORDER — SITAGLIPTIN PHOS-METFORMIN HCL 50-500 MG PO TABS: 1.0000 | ORAL_TABLET | Freq: Two times a day (BID) | ORAL | Status: DC

## 2013-05-18 ENCOUNTER — Encounter: Payer: BC Managed Care – PPO | Admitting: Gastroenterology

## 2013-05-25 ENCOUNTER — Other Ambulatory Visit: Payer: Self-pay

## 2013-05-25 MED ORDER — SIMVASTATIN 20 MG PO TABS: 20.0000 mg | ORAL_TABLET | Freq: Every day | ORAL | Status: DC

## 2013-05-30 ENCOUNTER — Ambulatory Visit (INDEPENDENT_AMBULATORY_CARE_PROVIDER_SITE_OTHER): Payer: BC Managed Care – PPO | Admitting: Internal Medicine

## 2013-05-30 ENCOUNTER — Encounter: Payer: Self-pay | Admitting: Internal Medicine

## 2013-05-30 ENCOUNTER — Other Ambulatory Visit (INDEPENDENT_AMBULATORY_CARE_PROVIDER_SITE_OTHER): Payer: BC Managed Care – PPO

## 2013-05-30 VITALS — BP 134/90 | HR 82 | Temp 98.2°F | Wt 305.0 lb

## 2013-05-30 DIAGNOSIS — E1159 Type 2 diabetes mellitus with other circulatory complications: Secondary | ICD-10-CM

## 2013-05-30 DIAGNOSIS — M2550 Pain in unspecified joint: Secondary | ICD-10-CM

## 2013-05-30 DIAGNOSIS — F39 Unspecified mood [affective] disorder: Secondary | ICD-10-CM

## 2013-05-30 DIAGNOSIS — M797 Fibromyalgia: Secondary | ICD-10-CM

## 2013-05-30 DIAGNOSIS — R4586 Emotional lability: Secondary | ICD-10-CM

## 2013-05-30 DIAGNOSIS — E559 Vitamin D deficiency, unspecified: Secondary | ICD-10-CM

## 2013-05-30 DIAGNOSIS — IMO0001 Reserved for inherently not codable concepts without codable children: Secondary | ICD-10-CM

## 2013-05-30 LAB — CK: CK TOTAL: 45 U/L (ref 7–177)

## 2013-05-30 LAB — SEDIMENTATION RATE: SED RATE: 12 mm/h (ref 0–22)

## 2013-05-30 LAB — HEMOGLOBIN A1C: HEMOGLOBIN A1C: 6.9 % — AB (ref 4.6–6.5)

## 2013-05-30 MED ORDER — CITALOPRAM HYDROBROMIDE 20 MG PO TABS: 20.0000 mg | ORAL_TABLET | Freq: Every day | ORAL | Status: DC

## 2013-05-30 NOTE — Progress Notes (Signed)
Subjective:    Patient ID: Alexis Grant, female    DOB: Jun 10, 1950, 63 y.o.   MRN: [id number]  HPI  Random glucose was 157  @ her cardiologist in February . Her A1c was 7.3% on October 8,2014. Results sent by My Chart 10/10 & read by her 11/09/12; but she did not follow up as recommended in the lab comments.  She is trying to exercise and has followed a low carb diet.  She checks her sugars and they're ranging 130s in the mornings. She is not checking 2 hours after a meal.  She describes tingling her feet. She also has polydipsia    Review of Systems  Unfortunately she ha been experiencing pain in the knee, ankles, back which is limiting her exercise. This is actually worse when supine. She has fibromyalgia & is followed by Dr Estanislado Pandy. Pain is "different " than usual fibromyalgia symptoms.  With the lifestyle changes she lost 9 pounds since April 30.  She is having emotional swings. She stopped her citalopram in early February as she did not feel she needed it anymore. She expresses her desire to restart it.      Objective:   Physical Exam Gen.: Adequately nourished in appearance; but weight excess present. Alert, appropriate and cooperative throughout exam. Appears younger than stated age  Head: Normocephalic without obvious abnormalities Eyes: No corneal or conjunctival inflammation noted. Pupils equal round reactive to light and accommodation. Extraocular motion intact. Ears: External  ear exam reveals no significant lesions or deformities.  Hearing is grossly normal bilaterally. Nose: External nasal exam reveals no deformity or inflammation. Nasal mucosa are pink and moist. No lesions or exudates noted.   Mouth: Oral mucosa and oropharynx reveal no lesions or exudates. Teeth in good repair. Neck: No deformities, masses, or tenderness noted. Thyroid normal. Lungs: Normal respiratory effort; chest expands symmetrically. Lungs are clear to auscultation without rales,  wheezes, or increased work of breathing. Heart: Normal rate and rhythm. Normal S1 and S2. No gallop, click, or rub. No murmur. Abdomen: protuberant.Bowel sounds normal; abdomen soft and nontender. No masses, organomegaly or hernias noted.                                Musculoskeletal/extremities: No deformity or scoliosis noted of  the thoracic or lumbar spine. No clubbing, cyanosis, edema, or significant extremity  deformity noted. Range of motion normal .Tone & strength normal. Hand joints normal. Crepitus in the knees. Fingernail / toenail health good. Able to lie down & sit up w/o help. Negative SLR bilaterally Vascular: Carotid, radial artery, dorsalis pedis and  posterior tibial pulses are full and equal. No bruits present. Neurologic: Alert and oriented x3. Deep tendon reflexes symmetrical and normal. Decreased sensation over the heels; there is some callus formation there. Decreased sensation over the great toes Gait normal      Skin: Intact without suspicious lesions or rashes. Lymph: No cervical, axillary lymphadenopathy present. Psych: Mood and affect are normal. Normally interactive  Assessment & Plan:   #1 diabetes, questionable status  #2 emotional changes off SSRI  #3 diffuse myalgias and arthralgias  See orders

## 2013-05-30 NOTE — Patient Instructions (Signed)
Your next office appointment will be determined based upon review of your pending labs. Those instructions will be transmitted to you through My Chart . 

## 2013-05-30 NOTE — Progress Notes (Signed)
Pre visit review using our clinic review tool, if applicable. No additional management support is needed unless otherwise documented below in the visit note. 

## 2013-06-02 ENCOUNTER — Other Ambulatory Visit: Payer: Self-pay | Admitting: Internal Medicine

## 2013-06-02 DIAGNOSIS — E1159 Type 2 diabetes mellitus with other circulatory complications: Secondary | ICD-10-CM

## 2013-06-02 LAB — VITAMIN D 1,25 DIHYDROXY
VITAMIN D3 1, 25 (OH): 31 pg/mL
Vitamin D 1, 25 (OH)2 Total: 31 pg/mL (ref 18–72)
Vitamin D2 1, 25 (OH)2: <8 pg/mL

## 2013-06-16 ENCOUNTER — Other Ambulatory Visit: Payer: Self-pay | Admitting: Cardiovascular Disease

## 2013-06-21 ENCOUNTER — Ambulatory Visit: Payer: BC Managed Care – PPO | Admitting: Gastroenterology

## 2013-08-02 ENCOUNTER — Encounter: Payer: Self-pay | Admitting: Gastroenterology

## 2013-08-07 ENCOUNTER — Other Ambulatory Visit: Payer: Self-pay | Admitting: Internal Medicine

## 2013-08-07 NOTE — Telephone Encounter (Signed)
OK X 3 mos 

## 2013-09-06 ENCOUNTER — Other Ambulatory Visit: Payer: Self-pay

## 2013-09-06 MED ORDER — DIVALPROEX SODIUM ER 500 MG PO TB24: ORAL_TABLET | ORAL | Status: DC

## 2013-09-19 ENCOUNTER — Encounter: Payer: Self-pay | Admitting: Gastroenterology

## 2013-09-25 ENCOUNTER — Other Ambulatory Visit: Payer: Self-pay

## 2013-09-25 DIAGNOSIS — R4586 Emotional lability: Secondary | ICD-10-CM

## 2013-09-25 MED ORDER — CITALOPRAM HYDROBROMIDE 20 MG PO TABS: 20.0000 mg | ORAL_TABLET | Freq: Every day | ORAL | Status: DC

## 2013-09-25 NOTE — Telephone Encounter (Signed)
Last office visit 05/30/13, looks like script for Citalopram was printed on this day #30 plus 3 refills

## 2013-09-25 NOTE — Telephone Encounter (Signed)
OK R X 2

## 2013-10-02 ENCOUNTER — Encounter: Payer: Self-pay | Admitting: *Deleted

## 2013-10-02 ENCOUNTER — Ambulatory Visit (INDEPENDENT_AMBULATORY_CARE_PROVIDER_SITE_OTHER): Payer: BC Managed Care – PPO | Admitting: *Deleted

## 2013-10-02 VITALS — BP 134/92

## 2013-10-02 DIAGNOSIS — I10 Essential (primary) hypertension: Secondary | ICD-10-CM

## 2013-10-09 ENCOUNTER — Ambulatory Visit (INDEPENDENT_AMBULATORY_CARE_PROVIDER_SITE_OTHER): Payer: BC Managed Care – PPO | Admitting: Internal Medicine

## 2013-10-09 ENCOUNTER — Encounter: Payer: Self-pay | Admitting: Internal Medicine

## 2013-10-09 ENCOUNTER — Other Ambulatory Visit (INDEPENDENT_AMBULATORY_CARE_PROVIDER_SITE_OTHER): Payer: BC Managed Care – PPO

## 2013-10-09 VITALS — BP 106/84 | HR 72 | Temp 98.2°F | Resp 13 | Wt 298.5 lb

## 2013-10-09 DIAGNOSIS — E785 Hyperlipidemia, unspecified: Secondary | ICD-10-CM

## 2013-10-09 DIAGNOSIS — E1159 Type 2 diabetes mellitus with other circulatory complications: Secondary | ICD-10-CM

## 2013-10-09 DIAGNOSIS — I10 Essential (primary) hypertension: Secondary | ICD-10-CM

## 2013-10-09 DIAGNOSIS — E039 Hypothyroidism, unspecified: Secondary | ICD-10-CM

## 2013-10-09 DIAGNOSIS — E559 Vitamin D deficiency, unspecified: Secondary | ICD-10-CM

## 2013-10-09 LAB — HEPATIC FUNCTION PANEL
ALK PHOS: 57 U/L (ref 39–117)
ALT: 15 U/L (ref 0–35)
AST: 16 U/L (ref 0–37)
Albumin: 3.8 g/dL (ref 3.5–5.2)
BILIRUBIN TOTAL: 0.5 mg/dL (ref 0.2–1.2)
Bilirubin, Direct: 0.1 mg/dL (ref 0.0–0.3)
Total Protein: 7.4 g/dL (ref 6.0–8.3)

## 2013-10-09 LAB — BASIC METABOLIC PANEL
BUN: 5 mg/dL — AB (ref 6–23)
CHLORIDE: 99 meq/L (ref 96–112)
CO2: 30 meq/L (ref 19–32)
Calcium: 9.3 mg/dL (ref 8.4–10.5)
Creatinine, Ser: 0.6 mg/dL (ref 0.4–1.2)
GFR: 113.92 mL/min (ref >60.00)
Glucose, Bld: 121 mg/dL — ABNORMAL HIGH (ref 70–99)
POTASSIUM: 3.9 meq/L (ref 3.5–5.1)
Sodium: 136 mEq/L (ref 135–145)

## 2013-10-09 LAB — LIPID PANEL
CHOLESTEROL: 157 mg/dL (ref 0–200)
HDL: 54.2 mg/dL (ref >39.00)
LDL CALC: 80 mg/dL (ref 0–99)
NonHDL: 102.8
Total CHOL/HDL Ratio: 3
Triglycerides: 112 mg/dL (ref 0.0–149.0)
VLDL: 22.4 mg/dL (ref 0.0–40.0)

## 2013-10-09 LAB — TSH: TSH: 2.11 u[IU]/mL (ref 0.35–4.50)

## 2013-10-09 LAB — CK: CK TOTAL: 47 U/L (ref 7–177)

## 2013-10-09 LAB — HEMOGLOBIN A1C: Hgb A1c MFr Bld: 6.9 % — ABNORMAL HIGH (ref 4.6–6.5)

## 2013-10-09 LAB — MICROALBUMIN / CREATININE URINE RATIO
Creatinine,U: 80.4 mg/dL
MICROALB/CREAT RATIO: 0.2 mg/g (ref 0.0–30.0)
Microalb, Ur: 0.2 mg/dL (ref 0.0–1.9)

## 2013-10-09 NOTE — Patient Instructions (Addendum)
Your next office appointment will be determined based upon review of your pending labs . Those instructions will be transmitted to you through My Chart  Minimal Blood Pressure Goal= AVERAGE < 140/90;  Ideal is an AVERAGE < 135/85. This AVERAGE should be calculated from @ least 5-7 BP readings taken @ different times of day on different days of week. You should not respond to isolated BP readings , but rather the AVERAGE for that week .Please bring your  blood pressure cuff to office visits to verify that it is reliable.It  can also be checked against the blood pressure device at the pharmacy. Finger or wrist cuffs are not dependable; an arm cuff is.   The normal goal for  Vitamin D is 40-60. Vitamin D, along with calcium 600 mg daily & weight bearing exercises ( walking 30-45 minutes 3-4 times per  week) are essential for bone health. Vitamin D deficiency is the # 1 cause of muscle pain in women. Take 1000 IU vit D3 daily.Check  Vitamin D level annually.

## 2013-10-09 NOTE — Assessment & Plan Note (Signed)
1000 IU vitamin D3 daily

## 2013-10-09 NOTE — Progress Notes (Signed)
Pre visit review using our clinic review tool, if applicable. No additional management support is needed unless otherwise documented below in the visit note. 

## 2013-10-09 NOTE — Assessment & Plan Note (Signed)
Lipids, LFTs, TSH ,CK 

## 2013-10-09 NOTE — Assessment & Plan Note (Signed)
Blood pressure goals reviewed. BMET 

## 2013-10-09 NOTE — Progress Notes (Signed)
   Subjective:    Patient ID: Alexis Grant, female    DOB: May 18, 1950, 63 y.o.   MRN: [id number]  HPI She is here to assess status of active health conditions:  Diet/ nutrition: Low carb .,low sugar. She is not on a heart healthy low-salt diet but does not add salt at the table. Exercise program: Walking 40 minutes 3 times a week without symptoms.  HYPERTENSION: Disease Monitoring: Blood pressure check  @ pharmacy. It tends to be in the range of 125 /90. Medication Compliance:yes  Diabetes :  FBS range/average: 130-135 Highest 2 hr post meal glucose: Not checked Medication compliance: Yes Hypoglycemia: No Ophthamology care: Up-to-date Podiatry care: Not up-to-date  HYPERLIPIDEMIA: Disease Monitoring: Medication Compliance: Yes   She has rare episodes of paroxysmal nocturnal dyspnea. She has sleep apnea and is on CPAP ordered Dr.Dohmier.  She describes diffuse joint muscle pain. The joint symptoms are mainly in her shoulders and back.  She also describes explosive diarrhea intermittently.  She also has polydipsia and polyuria.      Review of Systems   All below NEGATIVE: Chest pain, palpitations       Dyspnea on exertion with CVE Edema Claudication  Lightheadedness,Syncope Weight change Polyphagia Blurred vision /diplopia/lossof vision Limb numbness/tingling/burning Non healing skin lesions Abd pain, bowel changes         Objective:   Physical Exam   Positive or pertinent findings include: As per CDC guidelines obesity is present. The oropharynx is crowded. Abdomen is protuberant without organomegaly or masses. There is some varus deformity of the knees with minor crepitus Dorsalis pedis pulses are slightly decreased.  General appearance :adequately nourished; in no distress. Eyes: No conjunctival inflammation or scleral icterus is present. Oral exam: Dental hygiene is good. Lips and gums are healthy appearing.There is no oropharyngeal erythema or  exudate noted.  Heart:  Normal rate and regular rhythm. S1 and S2 normal without gallop, murmur, click, rub or other extra sounds   Lungs:Chest clear to auscultation; no wheezes, rhonchi,rales ,or rubs present.No increased work of breathing.  Abdomen: bowel sounds normal, soft and non-tender without masses, organomegaly or hernias noted.  No guarding or rebound.  Skin:Warm & dry.  Intact without suspicious lesions or rashes ; no jaundice or tenting Lymphatic: No lymphadenopathy is noted about the head, neck, axilla             Assessment & Plan:  See Current Assessment & Plan in Problem List under specific Diagnosis

## 2013-10-09 NOTE — Assessment & Plan Note (Signed)
A1c , urine microalbumin, BMET 

## 2013-10-09 NOTE — Assessment & Plan Note (Signed)
TSH 

## 2013-10-24 ENCOUNTER — Ambulatory Visit (INDEPENDENT_AMBULATORY_CARE_PROVIDER_SITE_OTHER): Payer: BC Managed Care – PPO | Admitting: Nurse Practitioner

## 2013-10-24 ENCOUNTER — Encounter: Payer: Self-pay | Admitting: Nurse Practitioner

## 2013-10-24 ENCOUNTER — Encounter: Payer: Self-pay | Admitting: Neurology

## 2013-10-24 VITALS — BP 122/76 | HR 79 | Wt 300.0 lb

## 2013-10-24 DIAGNOSIS — G43009 Migraine without aura, not intractable, without status migrainosus: Secondary | ICD-10-CM

## 2013-10-24 DIAGNOSIS — G40309 Generalized idiopathic epilepsy and epileptic syndromes, not intractable, without status epilepticus: Secondary | ICD-10-CM

## 2013-10-24 MED ORDER — DIVALPROEX SODIUM ER 500 MG PO TB24: ORAL_TABLET | ORAL | Status: DC

## 2013-10-24 NOTE — Progress Notes (Signed)
[facility]  PATIENT: Alexis Grant DOB: 1950/04/18   REASON FOR VISIT: Followup for seizure disorder, migraine   HISTORY OF PRESENT ILLNESS:[name] 63 year old white female returns today for followup. She was last seen 10/20/12. She has a history of seizure disorder with last known seizure in 2002. She also has a history of migraine. She has been on Topamax in the past and had side effects of GI distress. She is currently on Depakote , her headache control has not been good recently however she claims she wakes up in the middle of the night with headaches. She has not had a record of her headaches. She also has a history of reflux, fibromyalgia, and sleep apnea. She is using her CPAP routinely  Download dropped off in sleep lab.  She has a history of metabolic syndrome and, morbid obesity and dyslipidemia. She has lost 9 pounds since last seen him last year. She claims she walks 3 times only 45 minutes most recent hemoglobin A1c 6.9 Reviewed recent labs. She returns for reevaluation   REVIEW OF SYSTEMS: Full 14 system review of systems performed and notable only for those listed, all others are neg:  Constitutional: N/A  Cardiovascular: N/A  Ear/Nose/Throat: N/A  Skin: N/A  Eyes: N/A  Respiratory: N/A  Gastroitestinal: Urinary frequency  Hematology/Lymphatic: Easy bruising Endocrine: Excessive thirst Musculoskeletal: Joint pain, aching muscles Allergy/Immunology: N/A  Neurological: Headache, history of seizures Psychiatric: N/A Sleep : Obstructive sleep apnea  ALLERGIES: Allergies  Allergen Reactions  . Hydrocodone-Acetaminophen Itching    Itches all over; without rash per pt    HOME MEDICATIONS: Outpatient Prescriptions Prior to Visit  Medication Sig Dispense Refill  . aspirin 81 MG tablet Take 81 mg by mouth daily.        . citalopram (CELEXA) 20 MG tablet Take 1 tablet (20 mg total) by mouth daily.  30 tablet  2  . divalproex (DEPAKOTE ER) 500  MG 24 hr tablet 1 in the am 2 at hs  90 tablet  1  . fluticasone (FLONASE) 50 MCG/ACT nasal spray Place 1-2 sprays into both nostrils daily as needed for allergies or rhinitis.       . Fluticasone-Salmeterol (ADVAIR) 250-50 MCG/DOSE AEPB Inhale 1 puff into the lungs 2 (two) times daily as needed (seasonal allergies).      . furosemide (LASIX) 20 MG tablet Take 1 tablet (20 mg total) by mouth daily.  90 tablet  3  . glucose blood (ONE TOUCH TEST STRIPS) test strip 1 each by Other route. (Ultra Blue Test Strips) Check blood sugar twice daily DX 250.02  200 each  3  . levothyroxine (SYNTHROID, LEVOTHROID) 25 MCG tablet Take 25 mcg by mouth daily before breakfast.      . Liraglutide 18 MG/3ML SOPN Inject 1.8 mLs into the skin daily.      [name] loperamide (IMODIUM A-D) 2 MG tablet Take 2 mg by mouth as needed.        [name] losartan (COZAAR) 50 MG tablet TAKE 1 TABLET (50 MG TOTAL) BY MOUTH DAILY.      [name] NOVOFINE 32G X 6 MM MISC USE DAILY WITH PENS AS DIRECTED  100 each  3  . potassium chloride (K-DUR) 10 MEQ tablet Take 1 tablet (10 mEq total) by mouth daily.  90 tablet  3  . simvastatin (ZOCOR) 20 MG tablet Take 1 tablet (20 mg total) by mouth at bedtime.  30 tablet  5  . sitaGLIPtin-metformin (JANUMET) 50-500 MG per  tablet Take 1 tablet by mouth 2 (two) times daily with a meal.  180 tablet  1  . VICTOZA 18 MG/3ML SOPN INJECT 1.8MG  INTO SKIN DAILY.  9 mL  0  . losartan (COZAAR) 50 MG tablet TAKE 1 TABLET (50 MG TOTAL) BY MOUTH DAILY. PT NEEDS YEARLY FOLLOW UP FOR FURTHER REFILLS  90 tablet  2   No facility-administered medications prior to visit.    PAST MEDICAL HISTORY: Past Medical History  Diagnosis Date  . Depression   . Seizure disorder   . Dyslipidemia   . Arthritis   . OSA (obstructive sleep apnea)   . Obesity   . Hiatal hernia   . Hemangioma   . Myocardial infarct     subendocardrial, following R TKR  . GERD (gastroesophageal reflux disease)   . Gastritis   . Diabetes mellitus   .  Hypothyroidism 2010    Dr.Deveshawr  . IBS (irritable bowel syndrome)     Dr Fuller Plan  . Anemia   . Hyperlipidemia   . Hypertension   . Adenomatous polyp of colon 2008    PAST SURGICAL HISTORY: Past Surgical History  Procedure Laterality Date  . Cholecystectomy  1985  . Abdominal hysterectomy  1988  . Tubal ligation  1986  . Total knee arthroplasty  11/2005  . Cardiac catheterization  2007    DrGamble ( now seeing Dr Tonita Cong cardiology)  . Total knee arthroplasty      left  . Steroid injection to left si joint  12/2008    Dr.Newton  . Polypectomy  2012    6 or 7 adenomatous, Dr.Stark    FAMILY HISTORY: Family History  Problem Relation Age of Onset  . Colon cancer Neg Hx   . Heart attack Neg Hx   . Stroke Neg Hx   . Cancer Neg Hx   . Colon polyps Brother   . Colon polyps Mother   . Diabetes Sister   . Diabetes Paternal Aunt   . Ulcers Father     SOCIAL HISTORY: History   Social History  . Marital Status: Widowed    Spouse Name: N/A    Number of Children: 2  . Years of Education: college   Occupational History  .    [name] Retired    Social History Main Topics  . Smoking status: Never Smoker   . Smokeless tobacco: Never Used  . Alcohol Use: No  . Drug Use: No  . Sexual Activity: Not on file   Other Topics Concern  . Not on file   Social History Narrative   Illicit drug use- no   Patient does not get regular exercise due to knee.   Patient lives at home alone.    Patient son and wife lives next door.   Patient has 3 children.    Patient has some college.    Patient retired May 2014.      PHYSICAL EXAM  Filed Vitals:   10/24/13 0924  BP: 122/76  Pulse: 79  Weight: 300 lb (136.079 kg)   Body mass index is 53.16 kg/(m^2). Generalized: Well developed, morbidly obese in no acute distress  Head: normocephalic and atraumatic,. Oropharynx benign  Neck: Supple, no carotid bruits  Neurological examination  Mentation: Alert oriented to time, place,  history taking. Follows all commands speech and language fluent ESS 17 Cranial nerve II-XII: Pupils were equal round reactive to light extraocular movements were full, visual field were full on confrontational test. Facial sensation and strength  were normal. hearing was intact to finger rubbing bilaterally. Uvula tongue midline. head turning and shoulder shrug and were normal and symmetric.Tongue protrusion into cheek strength was normal.  Motor: normal bulk and tone, full strength in the BUE, BLE, fine finger movements normal, no pronator drift. No focal weakness  Coordination: finger-nose-finger, heel-to-shin bilaterally, no dysmetria  Reflexes: 1+ upper and lower and symmetric  Gait and Station: Rising up from seated position without assistance, normal stance,  moderate stride, good arm swing, smooth turning, able to perform tiptoe, and heel walking without difficulty. Unsteady with tandem      DIAGNOSTIC DATA (LABS, IMAGING, TESTING) - I reviewed patient records, labs, notes, testing and imaging myself where available.  Lab Results  Component Value Date   WBC 5.1 03/04/2013   HGB 13.0 03/04/2013   HCT 37.1 03/04/2013   MCV 81.9 03/04/2013   PLT 184 03/04/2013      Component Value Date/Time   NA 136 10/09/2013 0952   K 3.9 10/09/2013 0952   CL 99 10/09/2013 0952   CO2 30 10/09/2013 0952   GLUCOSE 121* 10/09/2013 0952   BUN 5* 10/09/2013 0952   CREATININE 0.6 10/09/2013 0952   CREATININE 0.64 04/09/2011 0921   CALCIUM 9.3 10/09/2013 0952   PROT 7.4 10/09/2013 0952   ALBUMIN 3.8 10/09/2013 0952   AST 16 10/09/2013 0952   ALT 15 10/09/2013 0952   ALKPHOS 57 10/09/2013 0952   BILITOT 0.5 10/09/2013 0952   GFRNONAA >90 03/04/2013 0830   GFRAA >90 03/04/2013 0830   Lab Results  Component Value Date   CHOL 157 10/09/2013   HDL 54.20 10/09/2013   LDLCALC 80 10/09/2013   TRIG 112.0 10/09/2013   CHOLHDL 3 10/09/2013   Lab Results  Component Value Date   HGBA1C 6.9* 10/09/2013    Lab Results    Component Value Date   TSH 2.11 10/09/2013      ASSESSMENT AND PLAN  63 y.o. year old female  has a past medical history of Depression; Seizure disorder; Dyslipidemia; Arthritis; OSA (obstructive sleep apnea); Obesity; Myocardial infarct; GERD (gastroesophageal reflux disease); Gastritis; Diabetes mellitus; Anemia; Hyperlipidemia; Hypertension; and headaches here to followup. She claims she is waking up frequently at night with headaches she has not kept a record. ESS score today 17. Seizure disorder is stable with last seizure being in 2002  Continue Depakote at current dose will refill Keep a record of your headaches and bring them to your next visit Drop-down load  in the sleep lab today Followup in 6 weeks Dennie Bible, [facility], [facility], [facility] [address] [place], [place] [address] [phone]

## 2013-10-24 NOTE — Patient Instructions (Signed)
Continue Depakote at current dose will refill Keep a record of your headaches and bring them to your next visit Drop-down load  in the sleep lab today Followup in 6 weeks

## 2013-11-12 ENCOUNTER — Other Ambulatory Visit: Payer: Self-pay

## 2013-11-12 MED ORDER — SIMVASTATIN 20 MG PO TABS: 20.0000 mg | ORAL_TABLET | Freq: Every day | ORAL | Status: DC

## 2013-11-20 ENCOUNTER — Other Ambulatory Visit: Payer: Self-pay | Admitting: Neurology

## 2013-12-06 LAB — HM DIABETES EYE EXAM

## 2013-12-09 ENCOUNTER — Other Ambulatory Visit: Payer: Self-pay | Admitting: Internal Medicine

## 2013-12-11 NOTE — Telephone Encounter (Signed)
OK X 3 mos if not done

## 2013-12-20 ENCOUNTER — Other Ambulatory Visit: Payer: Self-pay

## 2013-12-20 ENCOUNTER — Encounter: Payer: Self-pay | Admitting: Internal Medicine

## 2013-12-20 MED ORDER — SITAGLIPTIN PHOS-METFORMIN HCL 50-500 MG PO TABS: 1.0000 | ORAL_TABLET | Freq: Two times a day (BID) | ORAL | Status: DC

## 2013-12-22 ENCOUNTER — Other Ambulatory Visit: Payer: Self-pay | Admitting: Internal Medicine

## 2014-01-21 ENCOUNTER — Ambulatory Visit: Payer: BC Managed Care – PPO | Admitting: Nurse Practitioner

## 2014-02-14 ENCOUNTER — Other Ambulatory Visit: Payer: Self-pay

## 2014-02-14 MED ORDER — INSULIN PEN NEEDLE 31G X 5 MM MISC: Status: DC

## 2014-02-22 ENCOUNTER — Telehealth: Payer: Self-pay | Admitting: Internal Medicine

## 2014-02-22 ENCOUNTER — Other Ambulatory Visit: Payer: Self-pay | Admitting: Internal Medicine

## 2014-02-22 NOTE — Telephone Encounter (Signed)
Per pt she is needing a PA sent to [facility] for  [place] 18 MG/3ML SOPN [[id number]]   [phone]

## 2014-02-22 NOTE — Telephone Encounter (Signed)
PA completed on cover my meds. Waiting on insurance response.

## 2014-02-25 NOTE — Telephone Encounter (Signed)
[employer] has been approved for coverage until 02/26/15

## 2014-03-18 ENCOUNTER — Other Ambulatory Visit: Payer: Self-pay | Admitting: Internal Medicine

## 2014-03-18 NOTE — Telephone Encounter (Signed)
OK x 3 mos 

## 2014-03-20 ENCOUNTER — Telehealth: Payer: Self-pay

## 2014-03-20 NOTE — Telephone Encounter (Signed)
Received a fax from [facility] [phone] stating Janumet 50-500 mg requires prior authorization. This form has been completed via cover my meds. Waiting on insurance response.

## 2014-03-20 NOTE — Telephone Encounter (Signed)
Janumet has been denied by patient's insurance

## 2014-03-24 ENCOUNTER — Other Ambulatory Visit: Payer: Self-pay | Admitting: Neurology

## 2014-03-27 NOTE — Telephone Encounter (Signed)
The clinic was supposed to be doing refills, however this one did not get completed by them.

## 2014-04-01 ENCOUNTER — Telehealth: Payer: Self-pay | Admitting: Internal Medicine

## 2014-04-01 NOTE — Telephone Encounter (Signed)
Janumet was denied by patient's insurance ( see 03/20/14 phone note)

## 2014-04-01 NOTE — Telephone Encounter (Signed)
Pt called said that pharmacy is needing PA on her  sitaGLIPtin-metformin (JANUMET) 50-500 MG per tablet [[id number]]   Fax number [phone]

## 2014-04-03 ENCOUNTER — Encounter: Payer: Self-pay | Admitting: Gastroenterology

## 2014-04-04 ENCOUNTER — Ambulatory Visit (INDEPENDENT_AMBULATORY_CARE_PROVIDER_SITE_OTHER): Payer: 59 | Admitting: Cardiovascular Disease

## 2014-04-04 ENCOUNTER — Encounter: Payer: Self-pay | Admitting: Cardiovascular Disease

## 2014-04-04 ENCOUNTER — Other Ambulatory Visit: Payer: Self-pay

## 2014-04-04 VITALS — BP 136/84 | HR 88 | Ht 63.0 in | Wt 311.4 lb

## 2014-04-04 DIAGNOSIS — I10 Essential (primary) hypertension: Secondary | ICD-10-CM

## 2014-04-04 DIAGNOSIS — E785 Hyperlipidemia, unspecified: Secondary | ICD-10-CM

## 2014-04-04 NOTE — Patient Instructions (Signed)
Your physician wants you to follow-up in: 1 YEAR with Dr Cooper.  You will receive a reminder letter in the mail two months in advance. If you don't receive a letter, please call our office to schedule the follow-up appointment.  Your physician recommends that you continue on your current medications as directed. Please refer to the Current Medication list given to you today.  

## 2014-04-04 NOTE — Progress Notes (Signed)
Cardiology Office Note   Date:  04/04/2014   ID:  Alexis Grant, DOB [birth year], MRN [id number]  PCP:  Unice Cobble, MD  Cardiologist:  Sherren Mocha, MD    No chief complaint on file.    History of Present Illness: Alexis Grant is a 64 y.o. female who presents for follow-up of hypertension. The patient has type 2 diabetes, hyperlipidemia, and a remote history of paroxysmal atrial fibrillation. Last year when I saw her she complained of shortness of breath. An echocardiogram showed normal LV function. There was no valvular disease noted.  She actually feels quite well. She denies chest pain, shortness of breath, leg swelling, orthopnea, PND, or heart palpitations. States that her diabetes has been under good control. She has no cardiac related complaints today.   Past Medical History  Diagnosis Date  . Depression   . Seizure disorder   . Dyslipidemia   . Arthritis   . OSA (obstructive sleep apnea)   . Obesity   . Hiatal hernia   . Hemangioma   . Myocardial infarct     subendocardrial, following R TKR  . GERD (gastroesophageal reflux disease)   . Gastritis   . Diabetes mellitus   . Hypothyroidism 2010    Dr.Deveshawr  . IBS (irritable bowel syndrome)     Dr Fuller Plan  . Anemia   . Hyperlipidemia   . Hypertension   . Adenomatous polyp of colon 2008    Past Surgical History  Procedure Laterality Date  . Cholecystectomy  1985  . Abdominal hysterectomy  1988  . Tubal ligation  1986  . Total knee arthroplasty  11/2005  . Cardiac catheterization  2007    DrGamble ( now seeing Dr Tonita Cong cardiology)  . Total knee arthroplasty      left  . Steroid injection to left si joint  12/2008    Dr.Newton  . Polypectomy  2012    6 or 7 adenomatous, Dr.Stark    Current Outpatient Prescriptions  Medication Sig Dispense Refill  . aspirin 81 MG tablet Take 81 mg by mouth daily.      . citalopram (CELEXA) 20 MG tablet TAKE 1 TABLET (20 MG TOTAL) BY MOUTH DAILY. 30  tablet 2  . divalproex (DEPAKOTE ER) 500 MG 24 hr tablet TAKE 1 TABLET BY MOUTH IN THE MORNING AND TAKE 2 TABLETS AT BEDTIME 90 tablet 0  . fluticasone (FLONASE) 50 MCG/ACT nasal spray Place 1-2 sprays into both nostrils daily as needed for allergies or rhinitis.     . Fluticasone-Salmeterol (ADVAIR) 250-50 MCG/DOSE AEPB Inhale 1 puff into the lungs 2 (two) times daily as needed (seasonal allergies).    [name] glucose blood (ONE TOUCH TEST STRIPS) test strip 1 each by Other route. (Ultra Blue Test Strips) Check blood sugar twice daily DX 250.02 200 each 3  . Insulin Pen Needle 31G X 5 MM MISC USE DAILY WITH PENS AS DIRECTED 100 each 3  . levothyroxine (SYNTHROID, LEVOTHROID) 25 MCG tablet Take 25 mcg by mouth daily before breakfast.    . loperamide (IMODIUM A-D) 2 MG tablet Take 2 mg by mouth daily as needed for diarrhea or loose stools.     [name] losartan (COZAAR) 50 MG tablet TAKE 1 TABLET (50 MG TOTAL) BY MOUTH DAILY.    [name] NOVOFINE 32G X 6 MM MISC USE DAILY WITH PENS AS DIRECTED 100 each 3  . simvastatin (ZOCOR) 20 MG tablet Take 1 tablet (20 mg total) by mouth at bedtime. [place]  tablet 5  . sitaGLIPtin-metformin (JANUMET) 50-500 MG per tablet Take 1 tablet by mouth 2 (two) times daily with a meal. 180 tablet 1  . VICTOZA 18 MG/3ML SOPN INJECT 1.8MG  INTO SKIN DAILY. 3 pen 4   No current facility-administered medications for this visit.    Allergies:   Hydrocodone-acetaminophen   Social History:  The patient  reports that she has never smoked. She has never used smokeless tobacco. She reports that she does not drink alcohol or use illicit drugs.   Family History:  The patient's  family history includes Colon polyps in her brother and mother; Diabetes in her paternal aunt and sister; Ulcers in her father. There is no history of Colon cancer, Heart attack, Stroke, or Cancer.    ROS:  Please see the history of present illness.  Otherwise, review of systems is positive for abdominal pain, diarrhea, muscle  pain, easy bruising, leg pain, joint swelling, and headaches.  All other systems are reviewed and negative.    PHYSICAL EXAM: VS:  BP 136/84 mmHg  Pulse 88  Ht 5\' 3"  (1.6 m)  Wt 311 lb 6.4 oz (141.25 kg)  BMI 55.18 kg/m2 , BMI Body mass index is 55.18 kg/(m^2). GEN: Well nourished, well developed, in no acute distress HEENT: normal Neck: no JVD, no masses. No carotid bruits Cardiac: RRR without murmur or gallop                Respiratory:  clear to auscultation bilaterally, normal work of breathing GI: soft, nontender, nondistended, + BS MS: no deformity or atrophy Ext: no pretibial edema, pedal pulses 2+= bilaterally Skin: warm and dry, no rash Neuro:  Strength and sensation are intact Psych: euthymic mood, full affect  EKG:  EKG is ordered today. The ekg ordered today shows normal sinus rhythm 88 bpm, nonspecific T wave abnormality, otherwise within normal limits.  Recent Labs: 10/09/2013: ALT 15; BUN 5*; Creatinine 0.6; Potassium 3.9; Sodium 136; TSH 2.11   Lipid Panel     Component Value Date/Time   CHOL 157 10/09/2013 0952   TRIG 112.0 10/09/2013 0952   HDL 54.20 10/09/2013 0952   CHOLHDL 3 10/09/2013 0952   VLDL 22.4 10/09/2013 0952   LDLCALC 80 10/09/2013 0952      Wt Readings from Last 3 Encounters:  04/04/14 311 lb 6.4 oz (141.25 kg)  10/24/13 300 lb (136.079 kg)  10/09/13 298 lb 8 oz (135.399 kg)     Cardiac Studies Reviewed: 2D Echo 04/20/2013: Study Conclusions  Left ventricle: The cavity size was normal. Wall thickness was normal. Systolic function was normal. The estimated ejection fraction was in the range of 55% to 60%. Although no diagnostic regional wall motion abnormality was identified, this possibility cannot be completely excluded on the basis of this study.      ASSESSMENT AND PLAN: 1.  Essential hypertension: Blood pressure appears to be well controlled on losartan. No changes made today.  2. Type 2 diabetes: Appears to be stable.  Treated by Dr. Linna Darner.  3. Hyperlipidemia: The patient takes simvastatin. Most recent lipids reviewed and at goal. Note patient is without any history of coronary artery disease.  4. Obesity: Weight is unchanged.  Current medicines are reviewed with the patient today.  The patient does not have concerns regarding medicines.  The following changes have been made:  no change  Labs/ tests ordered today include:   Orders Placed This Encounter  Procedures  . EKG 12-Lead    Disposition:   FU  One year  Signed, Sherren Mocha, MD  04/04/2014 6:06 PM    [facility] [place], [place],   [address] Phone: [phone]; Fax: [phone]

## 2014-04-05 ENCOUNTER — Telehealth: Payer: Self-pay | Admitting: Internal Medicine

## 2014-04-05 NOTE — Telephone Encounter (Signed)
Pt request referral for [facility] to be send to Dr. Burt Knack at [facility]. Pt was there yesterday and they told her if we send this in in a couple day will day is [facility]

## 2014-04-08 NOTE — Telephone Encounter (Signed)
Left message for patient to call office. Pt needs to call [facility] and have PCP changed before referral can be submitted.

## 2014-04-09 NOTE — Telephone Encounter (Signed)
[employer] ref # [id number] valid 04/04/14-10/05/14 for 6 visits

## 2014-04-16 ENCOUNTER — Telehealth: Payer: Self-pay | Admitting: Internal Medicine

## 2014-04-16 DIAGNOSIS — E785 Hyperlipidemia, unspecified: Secondary | ICD-10-CM

## 2014-04-16 MED ORDER — SAXAGLIPTIN-METFORMIN ER 5-1000 MG PO TB24: ORAL_TABLET | ORAL | Status: DC

## 2014-04-16 NOTE — Telephone Encounter (Signed)
Patient advised. Lab order placed. [facility]

## 2014-04-16 NOTE — Telephone Encounter (Signed)
Pt called in and requesting call back about one of her meds.  Need some info about some alternative drugs    Best number [phone]

## 2014-04-16 NOTE — Telephone Encounter (Signed)
Kombiglyze 05/998 qd after largest meal #30 R x3  Fasting labs after 3 mos

## 2014-04-16 NOTE — Telephone Encounter (Signed)
Phone call back to patient. She states her insurance will cover either Kazano, Jentadueto, or Kombiglyze XR. Please advise which you want patient to start on.

## 2014-04-29 ENCOUNTER — Other Ambulatory Visit: Payer: Self-pay | Admitting: Neurology

## 2014-05-24 ENCOUNTER — Other Ambulatory Visit (INDEPENDENT_AMBULATORY_CARE_PROVIDER_SITE_OTHER): Payer: 59

## 2014-05-24 ENCOUNTER — Ambulatory Visit (INDEPENDENT_AMBULATORY_CARE_PROVIDER_SITE_OTHER): Payer: 59 | Admitting: Gastroenterology

## 2014-05-24 ENCOUNTER — Encounter: Payer: Self-pay | Admitting: Gastroenterology

## 2014-05-24 ENCOUNTER — Telehealth: Payer: Self-pay

## 2014-05-24 VITALS — BP 136/80 | HR 72 | Ht 63.25 in | Wt 311.0 lb

## 2014-05-24 DIAGNOSIS — R1031 Right lower quadrant pain: Secondary | ICD-10-CM | POA: Diagnosis not present

## 2014-05-24 DIAGNOSIS — Z86010 Personal history of colonic polyps: Secondary | ICD-10-CM

## 2014-05-24 DIAGNOSIS — R1032 Left lower quadrant pain: Secondary | ICD-10-CM

## 2014-05-24 DIAGNOSIS — R197 Diarrhea, unspecified: Secondary | ICD-10-CM

## 2014-05-24 LAB — IGA: IGA: 255 mg/dL (ref 68–378)

## 2014-05-24 MED ORDER — NA SULFATE-K SULFATE-MG SULF 17.5-3.13-1.6 GM/177ML PO SOLN: 1.0000 | Freq: Once | ORAL | Status: DC

## 2014-05-24 MED ORDER — METRONIDAZOLE 500 MG PO TABS
500.0000 mg | ORAL_TABLET | Freq: Two times a day (BID) | ORAL | Status: DC
Start: 2014-05-24 — End: 2014-07-16

## 2014-05-24 MED ORDER — GLYCOPYRROLATE 2 MG PO TABS: 2.0000 mg | ORAL_TABLET | Freq: Two times a day (BID) | ORAL | Status: DC

## 2014-05-24 NOTE — Telephone Encounter (Signed)
Left a message for patient to return my call. 

## 2014-05-24 NOTE — Patient Instructions (Signed)
Your physician has requested that you go to the basement for the following lab work before leaving today: IGA, TTG, GI pathogen panel  We have sent the following medications to your pharmacy for you to pick up at your convenience: Generic Robinul, Generic Flagyl  You have been scheduled for a colonoscopy. Please follow written instructions given to you at your visit today.  Please pick up your prep supplies at the pharmacy within the next 1-3 days. If you use inhalers (even only as needed), please bring them with you on the day of your procedure. Your physician has requested that you go to www.startemmi.com and enter the access code given to you at your visit today. This web site gives a general overview about your procedure. However, you should still follow specific instructions given to you by our office regarding your preparation for the procedure.   I appreciate the opportunity to care for you.

## 2014-05-24 NOTE — Progress Notes (Signed)
    History of Present Illness: This is a 64 year old female who presents for the evaluation of runny diarrhea and crampy lower abdominal pain. She has a history of IBS-D. She underwent colonoscopy in 02/2010 showing 7 adenomatous colon polyps. Random biopsies were negative for microscopic colitis. She was recommended to return for colonoscopy in 02/2013 however she did not do so. Treated with a course of metronidazole in 2012 and her symptoms drastically improved. They have returned over the past 2 years and she now has uncontrollable watery nonbloody diarrhea 5-6 times per day associated with crampy lower abdominal pain. She relates no recent antibiotic usage or medication changes associated with her diarrhea. There are no specific trigger foods. She notes frequent reflux symptoms for the past 4 weeks. Denies weight loss, constipation, change in stool caliber, melena, hematochezia, nausea, vomiting, dysphagia, chest pain.  Review of Systems: Pertinent positive and negative review of systems were noted in the above HPI section. All other review of systems were otherwise negative.  Current Medications, Allergies, Past Medical History, Past Surgical History, Family History and Social History were reviewed in [employer] record.  Physical Exam: General: Well developed, well nourished, obese, no acute distress Head: Normocephalic and atraumatic Eyes:  sclerae anicteric, EOMI Ears: Normal auditory acuity Mouth: No deformity or lesions Neck: Supple, no masses or thyromegaly Lungs: Clear throughout to auscultation Heart: Regular rate and rhythm; no murmurs, rubs or bruits Abdomen: Soft, minimal lower abdominal tenderness to palpation without rebound or guarding and non distended. No masses, hepatosplenomegaly or hernias noted. Normal Bowel sounds Rectal: deferred to colonoscopy Musculoskeletal: Symmetrical with no gross deformities  Skin: No lesions on visible extremities Pulses:   Normal pulses noted Extremities: No clubbing, cyanosis, edema or deformities noted Neurological: Alert oriented x 4, grossly nonfocal Cervical Nodes:  No significant cervical adenopathy Inguinal Nodes: No significant inguinal adenopathy Psychological:  Alert and cooperative. Normal mood and affect  Assessment and Recommendations:  1. Diarrhea associated with crampy lower abdominal pain. Presumed IBS-D. Rule out celiac disease and infectious causes. Obtain IgA, tTG and a GI pathogen panel. After stool is collected begin a 10 day course of metronidazole 500 mg twice a day. Begin glycopyrrolate 2 mg twice a day as needed for abd pain, cramping.  2. Personal history of multiple adenomatous colon polyps. Overdue for surveillance. Schedule colonoscopy. The risks (including bleeding, perforation, infection, missed lesions, medication reactions and possible hospitalization or surgery if complications occur), benefits, and alternatives to colonoscopy with possible biopsy and possible polypectomy were discussed with the patient and they consent to proceed.   3. Fatty liver, DM and obesity. BMI=54. Long-term low fat diet and weight loss program supervised by her PCP. Optimal mgmt of DM.   4. GERD. Begin standard antireflux measures and Prilosec OTC one every morning.

## 2014-05-27 ENCOUNTER — Other Ambulatory Visit: Payer: 59

## 2014-05-27 ENCOUNTER — Encounter: Payer: Self-pay | Admitting: Gastroenterology

## 2014-05-27 DIAGNOSIS — R197 Diarrhea, unspecified: Secondary | ICD-10-CM

## 2014-05-27 DIAGNOSIS — R1031 Right lower quadrant pain: Secondary | ICD-10-CM

## 2014-05-27 DIAGNOSIS — R1032 Left lower quadrant pain: Secondary | ICD-10-CM

## 2014-05-27 LAB — TISSUE TRANSGLUTAMINASE, IGA: TISSUE TRANSGLUTAMINASE AB, IGA: 1 U/mL (ref <4)

## 2014-05-27 NOTE — Telephone Encounter (Signed)
Pt said she is returning your call °

## 2014-05-27 NOTE — Telephone Encounter (Signed)
Left a message for patient to return my call. 

## 2014-05-28 ENCOUNTER — Other Ambulatory Visit: Payer: Self-pay | Admitting: *Deleted

## 2014-05-28 DIAGNOSIS — R197 Diarrhea, unspecified: Secondary | ICD-10-CM

## 2014-05-28 NOTE — Telephone Encounter (Signed)
Patient needs to be rescheduled for colonoscopy at the hospital due to BMI >50. Patient has been advised that we need to reschedule her. Patient has been scheduled at [facility] for 07/16/14 @ 10:30 with Dr Fuller Plan. I have spoken to patient and have advised of new instructions as well as time/date/location of procedure. She verbalizes understanding. I have also mailed updated instructions for her. She will call back with any questions.

## 2014-06-06 ENCOUNTER — Other Ambulatory Visit: Payer: Self-pay | Admitting: Internal Medicine

## 2014-06-07 LAB — GASTROINTESTINAL PATHOGEN PANEL PCR
C. DIFFICILE TOX A/B, PCR: NEGATIVE
CRYPTOSPORIDIUM, PCR: NEGATIVE
Campylobacter, PCR: NEGATIVE
E COLI (ETEC) LT/ST, PCR: NEGATIVE
E COLI 0157, PCR: NEGATIVE
E coli (STEC) stx1/stx2, PCR: NEGATIVE
GIARDIA LAMBLIA, PCR: NEGATIVE
Norovirus, PCR: NEGATIVE
Rotavirus A, PCR: NEGATIVE
SHIGELLA, PCR: NEGATIVE
Salmonella, PCR: NEGATIVE

## 2014-06-12 ENCOUNTER — Other Ambulatory Visit: Payer: Self-pay | Admitting: Neurology

## 2014-06-14 ENCOUNTER — Telehealth: Payer: Self-pay | Admitting: Internal Medicine

## 2014-06-14 MED ORDER — SIMVASTATIN 20 MG PO TABS: 20.0000 mg | ORAL_TABLET | Freq: Every day | ORAL | Status: DC

## 2014-06-14 NOTE — Telephone Encounter (Signed)
Pt called and needs refill on her simvastatin (ZOCOR) 20 MG tablet [[id number]]    [facility] [address]

## 2014-06-14 NOTE — Telephone Encounter (Signed)
[facility]

## 2014-07-04 ENCOUNTER — Encounter: Payer: 59 | Admitting: Gastroenterology

## 2014-07-09 ENCOUNTER — Encounter (HOSPITAL_COMMUNITY): Payer: Self-pay | Admitting: *Deleted

## 2014-07-09 ENCOUNTER — Other Ambulatory Visit: Payer: Self-pay

## 2014-07-09 ENCOUNTER — Telehealth: Payer: Self-pay | Admitting: Neurology

## 2014-07-09 DIAGNOSIS — G4733 Obstructive sleep apnea (adult) (pediatric): Secondary | ICD-10-CM

## 2014-07-09 NOTE — Telephone Encounter (Signed)
Pt called office to inform that she is in need of cpap tubing, filter and face mask. Pt states she was told order needed to be sent to [facility]. Please contact pt when request is reviewed.

## 2014-07-09 NOTE — Telephone Encounter (Signed)
Called pt and informed her that an order for cpap supplies was sent to [facility]. Pt verbalized understanding.

## 2014-07-14 ENCOUNTER — Other Ambulatory Visit: Payer: Self-pay | Admitting: Cardiovascular Disease

## 2014-07-14 ENCOUNTER — Other Ambulatory Visit: Payer: Self-pay | Admitting: Neurology

## 2014-07-15 ENCOUNTER — Other Ambulatory Visit: Payer: Self-pay

## 2014-07-15 MED ORDER — LOSARTAN POTASSIUM 50 MG PO TABS: ORAL_TABLET | ORAL | Status: DC

## 2014-07-16 ENCOUNTER — Ambulatory Visit (HOSPITAL_COMMUNITY): Payer: 59 | Admitting: Anesthesiology

## 2014-07-16 ENCOUNTER — Encounter (HOSPITAL_COMMUNITY)
Admission: RE | Disposition: A | Discharge status: Home / Self Care | Payer: Self-pay | Source: Ambulatory Visit | Attending: Gastroenterology

## 2014-07-16 ENCOUNTER — Ambulatory Visit (HOSPITAL_COMMUNITY)
Admission: RE | Admit: 2014-07-16 | Discharge: 2014-07-16 | Disposition: A | Discharge status: Home / Self Care | Payer: 59 | Source: Ambulatory Visit | Attending: Gastroenterology | Admitting: Gastroenterology

## 2014-07-16 ENCOUNTER — Encounter (HOSPITAL_COMMUNITY): Payer: Self-pay | Admitting: Anesthesiology

## 2014-07-16 DIAGNOSIS — I10 Essential (primary) hypertension: Secondary | ICD-10-CM | POA: Insufficient documentation

## 2014-07-16 DIAGNOSIS — G4730 Sleep apnea, unspecified: Secondary | ICD-10-CM | POA: Insufficient documentation

## 2014-07-16 DIAGNOSIS — R197 Diarrhea, unspecified: Secondary | ICD-10-CM

## 2014-07-16 DIAGNOSIS — Z1211 Encounter for screening for malignant neoplasm of colon: Secondary | ICD-10-CM | POA: Insufficient documentation

## 2014-07-16 DIAGNOSIS — D123 Benign neoplasm of transverse colon: Secondary | ICD-10-CM | POA: Diagnosis not present

## 2014-07-16 DIAGNOSIS — K648 Other hemorrhoids: Secondary | ICD-10-CM | POA: Diagnosis not present

## 2014-07-16 DIAGNOSIS — Z6841 Body Mass Index (BMI) 40.0 and over, adult: Secondary | ICD-10-CM | POA: Diagnosis not present

## 2014-07-16 DIAGNOSIS — E119 Type 2 diabetes mellitus without complications: Secondary | ICD-10-CM | POA: Insufficient documentation

## 2014-07-16 DIAGNOSIS — D120 Benign neoplasm of cecum: Secondary | ICD-10-CM | POA: Insufficient documentation

## 2014-07-16 DIAGNOSIS — R569 Unspecified convulsions: Secondary | ICD-10-CM | POA: Insufficient documentation

## 2014-07-16 DIAGNOSIS — D122 Benign neoplasm of ascending colon: Secondary | ICD-10-CM | POA: Diagnosis not present

## 2014-07-16 DIAGNOSIS — Z86010 Personal history of colonic polyps: Secondary | ICD-10-CM | POA: Diagnosis not present

## 2014-07-16 DIAGNOSIS — D124 Benign neoplasm of descending colon: Secondary | ICD-10-CM

## 2014-07-16 DIAGNOSIS — Z885 Allergy status to narcotic agent status: Secondary | ICD-10-CM | POA: Diagnosis not present

## 2014-07-16 DIAGNOSIS — I252 Old myocardial infarction: Secondary | ICD-10-CM | POA: Insufficient documentation

## 2014-07-16 HISTORY — PX: COLONOSCOPY WITH PROPOFOL: SHX5780

## 2014-07-16 LAB — GLUCOSE, CAPILLARY: Glucose-Capillary: 122 mg/dL — ABNORMAL HIGH (ref 65–99)

## 2014-07-16 SURGERY — COLONOSCOPY WITH PROPOFOL
Anesthesia: Monitor Anesthesia Care

## 2014-07-16 MED ORDER — PROPOFOL 10 MG/ML IV BOLUS
INTRAVENOUS | Status: AC
  Filled 2014-07-16: qty 20

## 2014-07-16 MED ORDER — SODIUM CHLORIDE 0.9 % IV SOLN: INTRAVENOUS | Status: DC

## 2014-07-16 MED ORDER — LIDOCAINE HCL 1 % IJ SOLN
INTRAMUSCULAR | Status: AC
  Filled 2014-07-16: qty 20

## 2014-07-16 MED ORDER — PROPOFOL 10 MG/ML IV BOLUS
INTRAVENOUS | Status: DC | PRN
  Administered 2014-07-16 (×6): 20 mg via INTRAVENOUS
  Administered 2014-07-16: 30 mg via INTRAVENOUS
  Administered 2014-07-16 (×15): 20 mg via INTRAVENOUS

## 2014-07-16 MED ORDER — LACTATED RINGERS IV SOLN
INTRAVENOUS | Status: DC
  Administered 2014-07-16: 1000 mL via INTRAVENOUS

## 2014-07-16 MED ORDER — MIDAZOLAM HCL 5 MG/5ML IJ SOLN
INTRAMUSCULAR | Status: DC | PRN
  Administered 2014-07-16 (×2): 1 mg via INTRAVENOUS

## 2014-07-16 MED ORDER — PROPOFOL INFUSION 10 MG/ML OPTIME: INTRAVENOUS | Status: DC | PRN

## 2014-07-16 MED ORDER — MIDAZOLAM HCL 2 MG/2ML IJ SOLN
INTRAMUSCULAR | Status: AC
  Filled 2014-07-16: qty 2

## 2014-07-16 SURGICAL SUPPLY — 22 items

## 2014-07-16 NOTE — Op Note (Signed)
[facility] [place], [address]   COLONOSCOPY PROCEDURE REPORT  PATIENT: Alexis Grant, Alexis Grant  MR#: [id number] BIRTHDATE: September 24, 1950 , 63  yrs. old GENDER: female ENDOSCOPIST: Ladene Artist, MD, [facility] PROCEDURE DATE:  07/16/2014 PROCEDURE:   Colonoscopy, surveillance , Colonoscopy with biopsy, and Colonoscopy with snare polypectomy First Screening Colonoscopy - Avg.  risk and is 50 yrs.  old or older - No.  Prior Negative Screening - Now for repeat screening. N/A  History of Adenoma - Now for follow-up colonoscopy & has been > or = to 3 yrs.  Yes hx of adenoma.  Has been 3 or more years since last colonoscopy.  Polyps removed today? Yes ASA CLASS:   Class III INDICATIONS:Surveillance due to prior colonic neoplasia and PH Colon Adenoma. MEDICATIONS: Monitored anesthesia care and Per Anesthesia DESCRIPTION OF PROCEDURE:   After the risks benefits and alternatives of the procedure were thoroughly explained, informed consent was obtained.  The digital rectal exam revealed no abnormalities of the rectum.   The Pentax Ped Colon [id number] endoscope was introduced through the anus and advanced to the cecum, which was identified by both the appendix and ileocecal valve. No adverse events experienced.   The quality of the prep was excellent.  (Suprep was used)  The instrument was then slowly withdrawn as the colon was fully examined. Estimated blood loss is zero unless otherwise noted in this procedure report.    COLON FINDINGS: Two semi-pedunculated polyps measuring 6-8 mm in size were found in the ascending colon and at the cecum. Polypectomies were performed with a cold snare.  The resection was complete, the polyp tissue was completely retrieved and sent to histology.  A semi-pedunculated polyp measuring 10 mm in size was found in the transverse colon.  A polypectomy was performed using snare cautery.  The resection was complete, the polyp tissue  was completely retrieved and sent to histology.   A sessile polyp measuring 5 mm in size was found in the transverse colon.  A polypectomy was performed with cold forceps.  The resection was complete, the polyp tissue was completely retrieved and sent to histology.  Six sessile polyps measuring 6-7 mm in size were found in the descending colon (2) and transverse colon (4). Polypectomies were performed with a cold snare.  The resection was complete, the polyp tissue was completely retrieved and sent to histology.   The examination was otherwise normal.  Retroflexed views revealed internal Grade I hemorrhoids. The time to cecum = 3.4 Withdrawal time = 27.6   The scope was withdrawn and the procedure completed. COMPLICATIONS: There were no immediate complications.   ENDOSCOPIC IMPRESSION: 1.   Two semi-pedunculated polyps in the ascending colon and at the cecum; polypectomies performed with a cold snare 2.   Semi-pedunculated polyp in the transverse colon; polypectomy performed using snare cautery 3.   Sessile polyp in the transverse colon; polypectomy performed with cold forceps 4.   Six sessile polyps in the descending colon and transverse colon; polypectomies performed with a cold snare 5.   Grade l internal hemorrhoids  RECOMMENDATIONS: 1.  Hold Aspirin and all other NSAIDS for 2 weeks. 2.  Await pathology results 3.  Repeat Colonoscopy in 2-3 years pending pathology review  eSigned:  Ladene Artist, MD, [facility] 07/16/2014 10:37 AM     PATIENT NAME:  Alexis Grant, Alexis Grant MR#: [id number]

## 2014-07-16 NOTE — Anesthesia Postprocedure Evaluation (Signed)
  Anesthesia Post-op Note  Patient: Alexis Grant  Procedure(s) Performed: Procedure(s) (LRB): COLONOSCOPY WITH PROPOFOL (N/A)  Patient Location: PACU  Anesthesia Type: MAC  Level of Consciousness: awake and alert   Airway and Oxygen Therapy: Patient Spontanous Breathing  Post-op Pain: mild  Post-op Assessment: Post-op Vital signs reviewed, Patient's Cardiovascular Status Stable, Respiratory Function Stable, Patent Airway and No signs of Nausea or vomiting  Last Vitals:  Filed Vitals:   07/16/14 1100  BP: 107/89  Pulse: 73  Temp:   Resp: 16    Post-op Vital Signs: stable   Complications: No apparent anesthesia complications

## 2014-07-16 NOTE — Transfer of Care (Signed)
Immediate Anesthesia Transfer of Care Note  Patient: Alexis Grant  Procedure(s) Performed: Procedure(s): COLONOSCOPY WITH PROPOFOL (N/A)  Patient Location: PACU and Endoscopy Unit  Anesthesia Type:MAC  Level of Consciousness: awake, oriented and patient cooperative  Airway & Oxygen Therapy: Patient Spontanous Breathing and Patient connected to face mask oxygen  Post-op Assessment: Report given to RN and Post -op Vital signs reviewed and stable  Post vital signs: Reviewed and stable  Last Vitals:  Filed Vitals:   07/16/14 0950  BP:   Pulse:   Temp:   Resp: 12    Complications: No apparent anesthesia complications

## 2014-07-16 NOTE — Discharge Instructions (Signed)
Colon Polyps Polyps are lumps of extra tissue growing inside the body. Polyps can grow in the large intestine (colon). Most colon polyps are noncancerous (benign). However, some colon polyps can become cancerous over time. Polyps that are larger than a pea may be harmful. To be safe, caregivers remove and test all polyps. CAUSES  Polyps form when mutations in the genes cause your cells to grow and divide even though no more tissue is needed. RISK FACTORS There are a number of risk factors that can increase your chances of getting colon polyps. They include:  Being older than 50 years.  Family history of colon polyps or colon cancer.  Long-term colon diseases, such as colitis or Crohn disease.  Being overweight.  Smoking.  Being inactive.  Drinking too much alcohol. SYMPTOMS  Most small polyps do not cause symptoms. If symptoms are present, they may include:  Blood in the stool. The stool may look dark red or black.  Constipation or diarrhea that lasts longer than 1 week. DIAGNOSIS People often do not know they have polyps until their caregiver finds them during a regular checkup. Your caregiver can use 4 tests to check for polyps:  Digital rectal exam. The caregiver wears gloves and feels inside the rectum. This test would find polyps only in the rectum.  Barium enema. The caregiver puts a liquid called barium into your rectum before taking X-rays of your colon. Barium makes your colon look white. Polyps are dark, so they are easy to see in the X-ray pictures.  Sigmoidoscopy. A thin, flexible tube (sigmoidoscope) is placed into your rectum. The sigmoidoscope has a light and tiny camera in it. The caregiver uses the sigmoidoscope to look at the last third of your colon.  Colonoscopy. This test is like sigmoidoscopy, but the caregiver looks at the entire colon. This is the most common method for finding and removing polyps. TREATMENT  Any polyps will be removed during a  sigmoidoscopy or colonoscopy. The polyps are then tested for cancer. PREVENTION  To help lower your risk of getting more colon polyps:  Eat plenty of fruits and vegetables. Avoid eating fatty foods.  Do not smoke.  Avoid drinking alcohol.  Exercise every day.  Lose weight if recommended by your caregiver.  Eat plenty of calcium and folate. Foods that are rich in calcium include milk, cheese, and broccoli. Foods that are rich in folate include chickpeas, kidney beans, and spinach. HOME CARE INSTRUCTIONS Keep all follow-up appointments as directed by your caregiver. You may need periodic exams to check for polyps. SEEK MEDICAL CARE IF: You notice bleeding during a bowel movement. Document Released: 10/01/2003 Document Revised: 03/29/2011 Document Reviewed: 03/16/2011 [facility] Patient Information 2015 [place], [place]. This information is not intended to replace advice given to you by your health care provider. Make sure you discuss any questions you have with your health care provider. Colonoscopy, Care After These instructions give you information on caring for yourself after your procedure. Your doctor may also give you more specific instructions. Call your doctor if you have any problems or questions after your procedure. HOME CARE  Do not drive for 24 hours.  Do not sign important papers or use machinery for 24 hours.  You may shower.  You may go back to your usual activities, but go slower for the first 24 hours.  Take rest breaks often during the first 24 hours.  Walk around or use warm packs on your belly (abdomen) if you have belly cramping or gas.  Drink enough fluids to keep your pee (urine) clear or pale yellow.  Resume your normal diet. Avoid heavy or fried foods.  Avoid drinking alcohol for 24 hours or as told by your doctor.  Only take medicines as told by your doctor. If a tissue sample (biopsy) was taken during the procedure:   Do not take aspirin or blood  thinners for 7 days, or as told by your doctor.  Do not drink alcohol for 7 days, or as told by your doctor.  Eat soft foods for the first 24 hours. GET HELP IF: You still have a small amount of blood in your poop (stool) 2-3 days after the procedure. GET HELP RIGHT AWAY IF:  You have more than a small amount of blood in your poop.  You see clumps of tissue (blood clots) in your poop.  Your belly is puffy (swollen).  You feel sick to your stomach (nauseous) or throw up (vomit).  You have a fever.  You have belly pain that gets worse and medicine does not help. MAKE SURE YOU:  Understand these instructions.  Will watch your condition.  Will get help right away if you are not doing well or get worse. Document Released: 02/06/2010 Document Revised: 01/09/2013 Document Reviewed: 09/11/2012 [facility] Patient Information 2015 [place], [place]. This information is not intended to replace advice given to you by your health care provider. Make sure you discuss any questions you have with your health care provider.

## 2014-07-16 NOTE — Anesthesia Preprocedure Evaluation (Addendum)
Anesthesia Evaluation  Patient identified by MRN, date of birth, ID band Patient awake    Reviewed: Allergy & Precautions, H&P , NPO status , Patient's Chart, lab work & pertinent test results  Airway Mallampati: II  TM Distance: >3 FB Neck ROM: full    Dental no notable dental hx. (+) Dental Advisory Given, Teeth Intact All upper front are missing:   Pulmonary sleep apnea ,  breath sounds clear to auscultation  Pulmonary exam normal       Cardiovascular Exercise Tolerance: Good hypertension, Pt. on medications + Past MI Normal cardiovascular examRhythm:regular Rate:Normal     Neuro/Psych Seizures -, Well Controlled,  vertigo negative neurological ROS  negative psych ROS   GI/Hepatic negative GI ROS, Neg liver ROS,   Endo/Other  diabetes, Well Controlled, Type 2, Oral Hypoglycemic AgentsHypothyroidism Morbid obesity  Renal/GU negative Renal ROS  negative genitourinary   Musculoskeletal   Abdominal (+) + obese,   Peds  Hematology negative hematology ROS (+)   Anesthesia Other Findings   Reproductive/Obstetrics negative OB ROS                          Anesthesia Physical Anesthesia Plan  ASA: III  Anesthesia Plan: MAC   Post-op Pain Management:    Induction:   Airway Management Planned:   Additional Equipment:   Intra-op Plan:   Post-operative Plan:   Informed Consent: I have reviewed the patients History and Physical, chart, labs and discussed the procedure including the risks, benefits and alternatives for the proposed anesthesia with the patient or authorized representative who has indicated his/her understanding and acceptance.   Dental Advisory Given  Plan Discussed with: CRNA and Surgeon  Anesthesia Plan Comments:         Anesthesia Quick Evaluation

## 2014-07-16 NOTE — H&P (Signed)
History of Present Illness: This is a 64 year old female who presents for the evaluation of diarrhea and crampy lower abdominal pain. She has a history of IBS-D. She underwent colonoscopy in 02/2010 showing 7 adenomatous colon polyps. Random biopsies were negative for microscopic colitis. She was recommended to return for colonoscopy in 02/2013 however she did not do so. Treated with a course of metronidazole in 2012 and her symptoms drastically improved. They have returned over the past 2 years and she now has uncontrollable watery nonbloody diarrhea 5-6 times per day associated with crampy lower abdominal pain. She relates no recent antibiotic usage or medication changes associated with her diarrhea. There are no specific trigger foods. She notes frequent reflux symptoms for the past 4 weeks. Denies weight loss, constipation, change in stool caliber, melena, hematochezia, nausea, vomiting, dysphagia, chest pain.  Review of Systems: Pertinent positive and negative review of systems were noted in the above HPI section. All other review of systems were otherwise negative.  Current Medications, Allergies, Past Medical History, Past Surgical History, Family History and Social History were reviewed in [employer] record.  Physical Exam: General: Well developed, well nourished, obese, no acute distress Head: Normocephalic and atraumatic Eyes: sclerae anicteric, EOMI Ears: Normal auditory acuity Mouth: No deformity or lesions Neck: Supple, no masses or thyromegaly Lungs: Clear throughout to auscultation Heart: Regular rate and rhythm; no murmurs, rubs or bruits Abdomen: Soft, minimal lower abdominal tenderness to palpation without rebound or guarding and non distended. No masses, hepatosplenomegaly or hernias noted. Normal Bowel sounds Rectal: deferred to colonoscopy Musculoskeletal: Symmetrical with no gross deformities  Skin: No lesions on visible extremities Pulses: Normal  pulses noted Extremities: No clubbing, cyanosis, edema or deformities noted Neurological: Alert oriented x 4, grossly nonfocal Cervical Nodes: No significant cervical adenopathy Inguinal Nodes: No significant inguinal adenopathy Psychological: Alert and cooperative. Normal mood and affect  Assessment and Recommendations:  1. Diarrhea associated with crampy lower abdominal pain. Presumed IBS-D. Rule out celiac disease and infectious causes. Obtain IgA, tTG and a GI pathogen panel. After stool is collected begin a 10 day course of metronidazole 500 mg twice a day. Begin glycopyrrolate 2 mg twice a day as needed for abd pain, cramping.  2. Personal history of multiple adenomatous colon polyps. Overdue for surveillance. Schedule colonoscopy. The risks (including bleeding, perforation, infection, missed lesions, medication reactions and possible hospitalization or surgery if complications occur), benefits, and alternatives to colonoscopy with possible biopsy and possible polypectomy were discussed with the patient and they consent to proceed.   3. Fatty liver, DM and obesity. BMI=54. Long-term low fat diet and weight loss program supervised by her PCP. Optimal mgmt of DM.   4. GERD. Begin standard antireflux measures and Prilosec OTC one every morning.

## 2014-07-17 ENCOUNTER — Encounter (HOSPITAL_COMMUNITY): Payer: Self-pay | Admitting: Gastroenterology

## 2014-07-17 ENCOUNTER — Encounter: Payer: Self-pay | Admitting: Gastroenterology

## 2014-08-05 ENCOUNTER — Telehealth: Payer: Self-pay | Admitting: Geriatric Medicine

## 2014-08-05 ENCOUNTER — Telehealth: Payer: Self-pay | Admitting: Neurology

## 2014-08-05 NOTE — Telephone Encounter (Signed)
I advised pt to bring her machine to [facility] for the card download. [facility] will contact us for orders for supplies. Pt verbalized understanding.

## 2014-08-05 NOTE — Telephone Encounter (Signed)
Left message for patient to call me back to let me know if she has had a mammogram recently.  

## 2014-08-05 NOTE — Telephone Encounter (Signed)
Patient called because she needs supplies and [facility] told her they need a report of the recent readings from her machine.  Please let patient know you have taken care of this.

## 2014-08-18 ENCOUNTER — Other Ambulatory Visit: Payer: Self-pay | Admitting: Internal Medicine

## 2014-08-19 ENCOUNTER — Other Ambulatory Visit: Payer: Self-pay | Admitting: Emergency Medicine

## 2014-08-19 MED ORDER — LIRAGLUTIDE 18 MG/3ML ~~LOC~~ SOPN: PEN_INJECTOR | SUBCUTANEOUS | Status: DC

## 2014-08-19 NOTE — Telephone Encounter (Signed)
OK 1 month only Needs OV & labs

## 2014-08-19 NOTE — Telephone Encounter (Signed)
Patients last office visit sept/2015, please advise, thanks

## 2014-08-23 MED ORDER — SAXAGLIPTIN-METFORMIN ER 5-1000 MG PO TB24: ORAL_TABLET | ORAL | Status: DC

## 2014-08-23 NOTE — Telephone Encounter (Signed)
Pt call back checking status on refill. Gave her md response sent 30 day to [facility]...Alexis Grant

## 2014-09-09 ENCOUNTER — Other Ambulatory Visit (INDEPENDENT_AMBULATORY_CARE_PROVIDER_SITE_OTHER): Payer: 59

## 2014-09-09 DIAGNOSIS — E785 Hyperlipidemia, unspecified: Secondary | ICD-10-CM

## 2014-09-09 LAB — LIPID PANEL
Cholesterol: 152 mg/dL (ref 0–200)
HDL: 50.9 mg/dL (ref >39.00)
LDL CALC: 77 mg/dL (ref 0–99)
NONHDL: 101.52
TRIGLYCERIDES: 125 mg/dL (ref 0.0–149.0)
Total CHOL/HDL Ratio: 3
VLDL: 25 mg/dL (ref 0.0–40.0)

## 2014-09-12 ENCOUNTER — Other Ambulatory Visit: Payer: Self-pay | Admitting: Neurology

## 2014-09-18 ENCOUNTER — Telehealth: Payer: Self-pay | Admitting: Internal Medicine

## 2014-09-18 ENCOUNTER — Other Ambulatory Visit: Payer: Self-pay | Admitting: Internal Medicine

## 2014-09-18 DIAGNOSIS — E1151 Type 2 diabetes mellitus with diabetic peripheral angiopathy without gangrene: Secondary | ICD-10-CM

## 2014-09-18 DIAGNOSIS — E039 Hypothyroidism, unspecified: Secondary | ICD-10-CM

## 2014-09-18 NOTE — Telephone Encounter (Signed)
Last OV 10/10/14, please advise

## 2014-09-18 NOTE — Telephone Encounter (Signed)
She needs diabetic lab test update and office visit; orders entered

## 2014-09-19 NOTE — Telephone Encounter (Signed)
LVM stating pt needs OV before refills. Labs have been entered and pt may come in before visit to have done.

## 2014-09-25 ENCOUNTER — Other Ambulatory Visit: Payer: Self-pay | Admitting: Emergency Medicine

## 2014-09-25 ENCOUNTER — Telehealth: Payer: Self-pay | Admitting: Internal Medicine

## 2014-09-25 MED ORDER — SAXAGLIPTIN-METFORMIN ER 5-1000 MG PO TB24: ORAL_TABLET | ORAL | Status: DC

## 2014-09-25 MED ORDER — LIRAGLUTIDE 18 MG/3ML ~~LOC~~ SOPN: PEN_INJECTOR | SUBCUTANEOUS | Status: DC

## 2014-09-25 NOTE — Telephone Encounter (Signed)
Pt called in and she did have her lab work done. I have scheduled an appointment for her for next week.  She is totally out of her medication. Can we go ahead and send her medication to [facility]

## 2014-09-25 NOTE — Telephone Encounter (Signed)
30 day refill has been sent to pharm. Pt must keep OV for further refills

## 2014-10-01 ENCOUNTER — Encounter: Payer: Self-pay | Admitting: Internal Medicine

## 2014-10-01 ENCOUNTER — Other Ambulatory Visit (INDEPENDENT_AMBULATORY_CARE_PROVIDER_SITE_OTHER): Payer: 59

## 2014-10-01 ENCOUNTER — Ambulatory Visit (INDEPENDENT_AMBULATORY_CARE_PROVIDER_SITE_OTHER): Payer: 59 | Admitting: Internal Medicine

## 2014-10-01 VITALS — BP 120/84 | HR 86 | Temp 99.0°F | Resp 16 | Wt 309.0 lb

## 2014-10-01 DIAGNOSIS — I10 Essential (primary) hypertension: Secondary | ICD-10-CM | POA: Diagnosis not present

## 2014-10-01 DIAGNOSIS — E1151 Type 2 diabetes mellitus with diabetic peripheral angiopathy without gangrene: Secondary | ICD-10-CM

## 2014-10-01 DIAGNOSIS — E785 Hyperlipidemia, unspecified: Secondary | ICD-10-CM | POA: Diagnosis not present

## 2014-10-01 DIAGNOSIS — E1159 Type 2 diabetes mellitus with other circulatory complications: Secondary | ICD-10-CM

## 2014-10-01 LAB — HEPATIC FUNCTION PANEL
ALBUMIN: 3.9 g/dL (ref 3.5–5.2)
ALT: 16 U/L (ref 0–35)
AST: 15 U/L (ref 0–37)
Alkaline Phosphatase: 59 U/L (ref 39–117)
Bilirubin, Direct: 0.1 mg/dL (ref 0.0–0.3)
Total Bilirubin: 0.3 mg/dL (ref 0.2–1.2)
Total Protein: 6.8 g/dL (ref 6.0–8.3)

## 2014-10-01 LAB — BASIC METABOLIC PANEL
BUN: 4 mg/dL — AB (ref 6–23)
CALCIUM: 9.2 mg/dL (ref 8.4–10.5)
CO2: 30 mEq/L (ref 19–32)
CREATININE: 0.56 mg/dL (ref 0.40–1.20)
Chloride: 100 mEq/L (ref 96–112)
GFR: 115.9 mL/min (ref >60.00)
GLUCOSE: 255 mg/dL — AB (ref 70–99)
Potassium: 3.5 mEq/L (ref 3.5–5.1)
Sodium: 140 mEq/L (ref 135–145)

## 2014-10-01 LAB — TSH: TSH: 2.07 u[IU]/mL (ref 0.35–4.50)

## 2014-10-01 LAB — HEMOGLOBIN A1C: HEMOGLOBIN A1C: 7 % — AB (ref 4.6–6.5)

## 2014-10-01 LAB — MICROALBUMIN / CREATININE URINE RATIO
CREATININE, U: 126.5 mg/dL
MICROALB UR: 2 mg/dL — AB (ref 0.0–1.9)
Microalb Creat Ratio: 1.6 mg/g (ref 0.0–30.0)

## 2014-10-01 NOTE — Assessment & Plan Note (Signed)
A1c , urine microalbumin, BMET 

## 2014-10-01 NOTE — Progress Notes (Signed)
Pre visit review using our clinic review tool, if applicable. No additional management support is needed unless otherwise documented below in the visit note. 

## 2014-10-01 NOTE — Assessment & Plan Note (Signed)
Her lipids were essentially at goal on 09/09/14. HDL was 50.9; LDL 77; triglycerides 125. No change in medication

## 2014-10-01 NOTE — Progress Notes (Signed)
   Subjective:    Patient ID: Alexis Grant, female    DOB: 12-Jan-1951, 64 y.o.   MRN: [id number]  HPI The patient is here to assess status of active health conditions.  PMH, FH, & Social History reviewed & updated.No change in [place] as recorded.  She has been compliant with her medicines without adverse effects. She does restrict meat and fried foods. She does eat some salt. She's not exercising.  Blood pressure averages less than 130/80, typically 125/70-80. Fasting blood sugars range 130-150. She has no hypoglycemia. Ophthalmologic exams up-to-date.  She does have irritable bowel with watery stool. She's received a course of antibiotics from Dr. Fuller Plan  But continues to have symptoms.  She states she does take the metformin combination after her evening meal.  She has occasional diplopia which she attributes to cataracts. She's discussed this with her Ophthalmologist.  She has nocturia 1-2 times per night.  Colonoscopy was performed 07/16/14  & revealed multiple polyps. She'll have a repeat colonoscopy in 2018    Review of Systems   Chest pain, palpitations, tachycardia, exertional dyspnea, paroxysmal nocturnal dyspnea, claudication or edema are absent. No unexplained weight loss, abdominal pain, significant dyspepsia, dysphagia, melena, rectal bleeding, or persistently small caliber stools. Dysuria, pyuria, hematuria, frequency, or polyuria are denied. Change in hair, skin, nails denied. No bowel changes of constipation . No intolerance to heat or cold.      Objective:   Physical Exam  Pertinent or positive findings include: Abdomen is massive; she has dullness to percussion in the right upper quadrant. Reflexes of the knees are 0-1/2+  General appearance :adequately nourished; in no distress.  Eyes: No conjunctival inflammation or scleral icterus is present.  Oral exam:  Lips and gums are healthy appearing.There is no oropharyngeal erythema or exudate noted. Dental hygiene  is good.  Heart:  Normal rate and regular rhythm. S1 and S2 normal without gallop, murmur, click, rub or other extra sounds    Lungs:Chest clear to auscultation; no wheezes, rhonchi,rales ,or rubs present.No increased work of breathing.   Abdomen: bowel sounds normal, soft and non-tender without masses, organomegaly or hernias noted.  No guarding or rebound.   Vascular : all pulses equal ; no bruits present.  Skin:Warm & dry.  Intact without suspicious lesions or rashes ; no tenting or jaundice   Lymphatic: No lymphadenopathy is noted about the head, neck, axilla.   Neuro: Strength, tone  normal.     Assessment & Plan:  See Current Assessment & Plan in Problem List under specific Diagnosis

## 2014-10-01 NOTE — Assessment & Plan Note (Signed)
Blood pressure goals reviewed. BMET 

## 2014-10-01 NOTE — Patient Instructions (Signed)
  Your next office appointment will be determined based upon review of your pending labs. Those written interpretation of the lab results and instructions will be transmitted to you by My Chart  Critical results will be called.   Followup as needed for any active or acute issue. Please report any significant change in your symptoms. 

## 2014-10-19 ENCOUNTER — Other Ambulatory Visit: Payer: Self-pay | Admitting: Internal Medicine

## 2014-10-19 ENCOUNTER — Other Ambulatory Visit: Payer: Self-pay | Admitting: Neurology

## 2014-10-21 NOTE — Telephone Encounter (Signed)
lmovm

## 2014-10-21 NOTE — Telephone Encounter (Signed)
Pt has called regarding these prescriptions

## 2014-10-23 ENCOUNTER — Ambulatory Visit: Payer: Self-pay | Admitting: Nurse Practitioner

## 2014-10-25 ENCOUNTER — Telehealth: Payer: Self-pay | Admitting: Internal Medicine

## 2014-10-25 NOTE — Telephone Encounter (Signed)
Please advise. Spoke with pt she stated that she will be leaving to go out of town for the week and will run out while she is out of town. She would like the refill to be sent to [facility] in [place]. Pt was informed that it would be Monday before a response came in from [place].

## 2014-10-25 NOTE — Telephone Encounter (Signed)
I am very sorry ; but this is a specialty medication which must come from her Neurologist

## 2014-10-25 NOTE — Telephone Encounter (Signed)
Pt would like to know if Dr. Linna Darner can start prescribing divalproex (DEPAKOTE ER) 500 MG 24 hr tablet [[id number]] for her. Her neurologist usually prescribes this and she no longer wants to see him. She has been taking this for over 20 years. Please advise pt

## 2014-10-28 ENCOUNTER — Other Ambulatory Visit: Payer: Self-pay | Admitting: Internal Medicine

## 2014-10-28 DIAGNOSIS — G40309 Generalized idiopathic epilepsy and epileptic syndromes, not intractable, without status epilepticus: Secondary | ICD-10-CM

## 2014-10-28 NOTE — Telephone Encounter (Signed)
Spoke with pt to inform.  

## 2014-10-28 NOTE — Telephone Encounter (Signed)
done

## 2014-10-28 NOTE — Telephone Encounter (Signed)
Pt stated that she does not want to return to this office, as she called the office to cancel her last appt, due to her son being in the office and the office told her that they make no exceptions and would be charging her a no show fee. She would like to know if we can refer her to another neurologist. Her current neurologist is [facility].

## 2014-11-04 ENCOUNTER — Telehealth: Payer: Self-pay | Admitting: Nurse Practitioner

## 2014-11-04 MED ORDER — DIVALPROEX SODIUM ER 500 MG PO TB24: ORAL_TABLET | ORAL | Status: DC

## 2014-11-04 NOTE — Telephone Encounter (Signed)
Patient is calling and would like to speak with you in regard to her medication Depakote ER 500 mg 24 hr.  She has an issue as she states she is changing doctors?  Please call.

## 2014-11-04 NOTE — Telephone Encounter (Signed)
I spoke to pt.  She stated that she would like refill of her her depakote until seen by her new neurologist at [facility] 11-28-14.   She is changing practices due to attitude of operator and policy of the no show/ less then 24 hr notice.  She was told that there is no exceptions to this rule, even though she has been a client of ours for some time.   She understands the policy and fee, but I think the way it was presented was the issue.  I offered to have her speak to our Environmental education officer but she declined.  I told her that a refill would be given until seen 11-28-14, and I would forward this message to Marcie Bal, our Environmental education officer.

## 2014-11-11 ENCOUNTER — Telehealth: Payer: Self-pay | Admitting: Gastroenterology

## 2014-11-11 NOTE — Telephone Encounter (Signed)
Patient reports continued diarrhea.  She would like to come in and discuss trying viberzi.  She will come in and see Dr. Fuller Plan on 11/18/14

## 2014-11-18 ENCOUNTER — Telehealth: Payer: Self-pay | Admitting: Gastroenterology

## 2014-11-18 ENCOUNTER — Telehealth: Payer: Self-pay

## 2014-11-18 ENCOUNTER — Encounter: Payer: Self-pay | Admitting: Gastroenterology

## 2014-11-18 ENCOUNTER — Ambulatory Visit (INDEPENDENT_AMBULATORY_CARE_PROVIDER_SITE_OTHER): Payer: 59 | Admitting: Gastroenterology

## 2014-11-18 VITALS — BP 112/78 | HR 80 | Ht 63.0 in | Wt 301.2 lb

## 2014-11-18 DIAGNOSIS — R159 Full incontinence of feces: Secondary | ICD-10-CM

## 2014-11-18 DIAGNOSIS — R634 Abnormal weight loss: Secondary | ICD-10-CM | POA: Diagnosis not present

## 2014-11-18 DIAGNOSIS — R197 Diarrhea, unspecified: Secondary | ICD-10-CM

## 2014-11-18 MED ORDER — ELUXADOLINE 75 MG PO TABS: 1.0000 | ORAL_TABLET | Freq: Two times a day (BID) | ORAL | Status: DC

## 2014-11-18 NOTE — Progress Notes (Signed)
    History of Present Illness: This is a 64 year old female returning for worsening diarrhea. Evaluation to date has been unremarkable. She has worsening diarrhea occurring 5-6 times per day associated with occasional episodes of significant urgency and fecal incontinence. She has also noted an unintentional 12 pound weight loss over the past several weeks. She is taking Imodium several times a day without significant results. She has tried a lactose-free diet, low caffeine and low FODMAP diet without a change in diarrhea. She tried glycopyrrolate and symptoms improved for about one week and then glycopyrrolate seemed to have no impact.  Current Medications, Allergies, Past Medical History, Past Surgical History, Family History and Social History were reviewed in [employer] record.  Physical Exam: General: Well developed, well nourished, obese, no acute distress Head: Normocephalic and atraumatic Eyes:  sclerae anicteric, EOMI Ears: Normal auditory acuity Mouth: No deformity or lesions Lungs: Clear throughout to auscultation Heart: Regular rate and rhythm; no murmurs, rubs or bruits Abdomen: Soft, non tender and non distended. No masses, hepatosplenomegaly or hernias noted. Normal Bowel sounds Musculoskeletal: Symmetrical with no gross deformities  Pulses:  Normal pulses noted Extremities: No clubbing, cyanosis, edema or deformities noted Neurological: Alert oriented x 4, grossly nonfocal Psychological:  Alert and cooperative. Normal mood and affect  Assessment and Recommendations:  1. Diarrhea, weight loss, fecal incontinence. Stool for fecal elastase. Abdominal/pelvic CT. Trial of her Virberzi 75 mg twice a day. REV in 1 month.

## 2014-11-18 NOTE — Telephone Encounter (Signed)
Informed patient that her first month of Viberzi should be free with no copay and then her copay for additional months should be 30 dollars. Pt thanked [place] and I told her to call if has any problems. Pt verbalized understanding.

## 2014-11-18 NOTE — Patient Instructions (Signed)
We have sent the following medications to your pharmacy for you to pick up at your convenience:Viberzi.  Your physician has requested that you go to the basement for the following lab work before leaving today:pancreatic fecal elastase.  You have been scheduled for a CT scan of the abdomen and pelvis at [facility] ([address]---this is in the same building as [facility]).   You are scheduled on 11/22/14 at 10:30am. You should arrive 15 minutes prior to your appointment time for registration. Please follow the written instructions below on the day of your exam:  WARNING: IF YOU ARE ALLERGIC TO IODINE/X-RAY DYE, PLEASE NOTIFY RADIOLOGY IMMEDIATELY AT [phone]! YOU WILL BE GIVEN A 13 HOUR PREMEDICATION PREP.  1) Do not eat or drink anything after 6:30am (4 hours prior to your test) 2) You have been given 2 bottles of oral contrast to drink. The solution may taste better if refrigerated, but do NOT add ice or any other liquid to this solution. Shake well before drinking.    Drink 1 bottle of contrast @ 8:30am (2 hours prior to your exam)  Drink 1 bottle of contrast @ 9:30am (1 hour prior to your exam)  You may take any medications as prescribed with a small amount of water except for the following: Metformin, Glucophage, Glucovance, Avandamet, Riomet, Fortamet, Actoplus Met, Janumet, Glumetza or Metaglip. The above medications must be held the day of the exam AND 48 hours after the exam.  The purpose of you drinking the oral contrast is to aid in the visualization of your intestinal tract. The contrast solution may cause some diarrhea. Before your exam is started, you will be given a small amount of fluid to drink. Depending on your individual set of symptoms, you may also receive an intravenous injection of x-ray contrast/dye. Plan on being at [facility] for 30 minutes or long, depending on the type of exam you are having performed.  This test typically takes  30-45 minutes to complete.  If you have any questions regarding your exam or if you need to reschedule, you may call the CT department at [phone] between the hours of 8:00 am and 5:00 pm, Monday-Friday.  ________________________________________________________________________ Thank you for choosing me and [facility].  Pricilla Riffle. Dagoberto Ligas., MD., [facility]

## 2014-11-18 NOTE — Telephone Encounter (Signed)
Activated patient savings card and then called in rx for viberzi.  Pharmacist will wait to process until rep Lennie Odor) brings the savings card up to pharmacy to see if it will work with patient's insurance.

## 2014-11-20 ENCOUNTER — Other Ambulatory Visit: Payer: 59

## 2014-11-20 DIAGNOSIS — R159 Full incontinence of feces: Secondary | ICD-10-CM

## 2014-11-20 DIAGNOSIS — R634 Abnormal weight loss: Secondary | ICD-10-CM

## 2014-11-20 DIAGNOSIS — R197 Diarrhea, unspecified: Secondary | ICD-10-CM

## 2014-11-22 ENCOUNTER — Other Ambulatory Visit (INDEPENDENT_AMBULATORY_CARE_PROVIDER_SITE_OTHER): Payer: 59

## 2014-11-22 ENCOUNTER — Ambulatory Visit (INDEPENDENT_AMBULATORY_CARE_PROVIDER_SITE_OTHER)
Admission: RE | Admit: 2014-11-22 | Discharge: 2014-11-22 | Disposition: A | Discharge status: Home / Self Care | Payer: 59 | Source: Ambulatory Visit | Attending: Gastroenterology | Admitting: Gastroenterology

## 2014-11-22 ENCOUNTER — Other Ambulatory Visit: Payer: Self-pay

## 2014-11-22 DIAGNOSIS — R197 Diarrhea, unspecified: Secondary | ICD-10-CM

## 2014-11-22 DIAGNOSIS — R634 Abnormal weight loss: Secondary | ICD-10-CM | POA: Diagnosis not present

## 2014-11-22 LAB — BUN: BUN: 7 mg/dL (ref 6–23)

## 2014-11-22 LAB — CREATININE, SERUM: CREATININE: 0.59 mg/dL (ref 0.40–1.20)

## 2014-11-22 MED ORDER — IOHEXOL 300 MG/ML  SOLN
100.0000 mL | Freq: Once | INTRAMUSCULAR | Status: AC | PRN
  Administered 2014-11-22: 100 mL via INTRAVENOUS

## 2014-11-27 LAB — PANCREATIC ELASTASE, FECAL: PANCREATIC ELASTASE-1, STL: 434 ug/g

## 2014-11-28 ENCOUNTER — Ambulatory Visit (INDEPENDENT_AMBULATORY_CARE_PROVIDER_SITE_OTHER): Payer: 59 | Admitting: Neurology

## 2014-11-28 ENCOUNTER — Encounter: Payer: Self-pay | Admitting: Neurology

## 2014-11-28 VITALS — BP 132/84 | HR 77 | Resp 16 | Wt 304.0 lb

## 2014-11-28 DIAGNOSIS — G40009 Localization-related (focal) (partial) idiopathic epilepsy and epileptic syndromes with seizures of localized onset, not intractable, without status epilepticus: Secondary | ICD-10-CM

## 2014-11-28 DIAGNOSIS — G43109 Migraine with aura, not intractable, without status migrainosus: Secondary | ICD-10-CM | POA: Diagnosis not present

## 2014-11-28 MED ORDER — DIVALPROEX SODIUM ER 500 MG PO TB24: ORAL_TABLET | ORAL | Status: DC

## 2014-11-28 NOTE — Patient Instructions (Signed)
1. Increase Depakote ER 500mg : Take 2 tablets twice a day 2. Keep a calendar of your headaches 3. Follow-up in 3 months  Seizure Precautions: 1. If medication has been prescribed for you to prevent seizures, take it exactly as directed.  Do not stop taking the medicine without talking to your doctor first, even if you have not had a seizure in a long time.   2. Avoid activities in which a seizure would cause danger to yourself or to others.  Don't operate dangerous machinery, swim alone, or climb in high or dangerous places, such as on ladders, roofs, or girders.  Do not drive unless your doctor says you may.  3. If you have any warning that you may have a seizure, lay down in a safe place where you can't hurt yourself.    4.  No driving for 6 months from last seizure, as per [facility].   Please refer to the following link on the [place] website for more information: http://[email or web address].org/answerplace/Social/driving/drivingu.cfm   5.  Maintain good sleep hygiene. Avoid alcohol.  6.  Contact your doctor if you have any problems that may be related to the medicine you are taking.  7.  Call 911 and bring the patient back to the ED if:        A.  The seizure lasts longer than 5 minutes.       B.  The patient doesn't awaken shortly after the seizure  C.  The patient has new problems such as difficulty seeing, speaking or moving  D.  The patient was injured during the seizure  E.  The patient has a temperature over 102 F (39C)  F.  The patient vomited and now is having trouble breathing

## 2014-11-28 NOTE — Progress Notes (Signed)
NEUROLOGY CONSULTATION NOTE  Alexis Grant MRN: [id number] DOB: 11/27/1950  Referring provider: Dr. Unice Cobble Primary care provider: Dr. Unice Cobble  Reason for consult:  Seizures, migraines  Dear Dr Linna Darner:  Thank you for your kind referral of Alexis Grant for consultation of the above symptoms. Although her history is well known to you, please allow me to reiterate it for the purpose of our medical record. Records and images were personally reviewed where available.  HISTORY OF PRESENT ILLNESS: This is a 64 year old right-handed woman with a history of hypertension, hyperlipidemia, diabetes, obesity, sleep apnea, CAD s/p MI, presenting to establish care for seizures and migraines. She reports seizures started at age 31, she woke up in the hospital with no prior warning symptoms. Since then, she has had 4 generalized tonic-clonic seizures, last was in 2002. She also report 5 or 6 "little episodes" where she feels dizzy, with blurred vision, then if she sits down quickly and tries to relax, she can avoid any progression. She would feel briefly confused. No associated olfactory/gustatory hallucinations, focal numbness/tingling/weakness, myoclonic jerks. The last time she had these episodes was at least 12 years ago. She recalls taking Neurontin initially, which did not help. Topamax in the past which caused GI symptoms. She has been taking Depakote ER 500mg  in AM, 1000mg  in PM for many years now with no side effects. She reports having brain imaging and EEG in the past which were normal, no records available for review.   She reports migraines started at age 14. She has an aura of numbness in her face, cheeks, and temple, then she becomes very sensitive to lights, sounds, and smells. She sees flashing lights then her vision becomes blurred, followed by throbbing headaches in the frontal or occipital regions with associated nausea. No associated focal numbness/tingling/weakness in the  extremities. She tried Imitrex with minimal effect. A course of Prednisone helps break her headache cycles. She continues to have 1-2 migraines a week, lasting 2 days. Last migraine was a week ago. No clear migraine triggers, lying in a dark room and taking Excedrin migraine help. Sleep is good with her CPAP. There is a family history of migraines in her mother and sister.   She denies any diplopia, dysarthria, dysphagia, back pain. She has chronic neck pain and has been dealing with diarrhea from IBS. She had an MI in 2010.   Epilepsy Risk Factors:  There is a strong family history of seizures in her paternal grandfather, paternal aunt, nephew. Otherwise she had a normal birth and early development.  There is no history of febrile convulsions, CNS infections such as meningitis/encephalitis, significant traumatic brain injury, neurosurgical procedures.  Laboratory Data: Lab Results  Component Value Date   WBC 5.1 03/04/2013   HGB 13.0 03/04/2013   HCT 37.1 03/04/2013   MCV 81.9 03/04/2013   PLT 184 03/04/2013      Chemistry      Component Value Date/Time   NA 140 10/01/2014 1211   K 3.5 10/01/2014 1211   CL 100 10/01/2014 1211   CO2 30 10/01/2014 1211   BUN 7 11/22/2014 1110   CREATININE 0.59 11/22/2014 1110   CREATININE 0.64 04/09/2011 0921      Component Value Date/Time   CALCIUM 9.2 10/01/2014 1211   ALKPHOS 59 10/01/2014 1211   AST 15 10/01/2014 1211   ALT 16 10/01/2014 1211   BILITOT 0.3 10/01/2014 1211     Lab Results  Component Value Date  VALPROATE 50 10/20/2012      PAST MEDICAL HISTORY: Past Medical History  Diagnosis Date  . Depression   . Seizure disorder ([place])   . Dyslipidemia   . Arthritis   . OSA (obstructive sleep apnea)   . Obesity   . Hiatal hernia   . Hemangioma   . Myocardial infarct ([facility])     subendocardrial, following R TKR  . GERD (gastroesophageal reflux disease)   . Gastritis   . Diabetes mellitus   . Hypothyroidism 2010     Dr.Deveshawr  . IBS (irritable bowel syndrome)     Dr Fuller Plan  . Anemia   . Hyperlipidemia   . Hypertension   . Adenomatous polyp of colon 2008  . Migraines   . Fibromyalgia   . Gallstones   . Pneumonia   . Urinary tract infection     PAST SURGICAL HISTORY: Past Surgical History  Procedure Laterality Date  . Cholecystectomy  1985  . Abdominal hysterectomy  1988  . Tubal ligation  1986  . Total knee arthroplasty Right 11/2005  . Cardiac catheterization  2007    DrGamble ( now seeing Dr Tonita Cong cardiology)  . Total knee arthroplasty Left   . Steroid injection to left si joint  12/2008    Dr.Newton  . Polypectomy  2012    6 or 7 adenomatous, Dr.Stark  . Colonoscopy with propofol N/A 07/16/2014    Procedure: COLONOSCOPY WITH PROPOFOL;  Surgeon: Ladene Artist, MD;  Location: [facility];  Service: Endoscopy;  Laterality: N/A;    MEDICATIONS: Current Outpatient Prescriptions on File Prior to Visit  Medication Sig Dispense Refill  . citalopram (CELEXA) 20 MG tablet TAKE 1 TABLET (20 MG TOTAL) BY MOUTH DAILY. 30 tablet 5  . divalproex (DEPAKOTE ER) 500 MG 24 hr tablet Take one tablet in the am and 2 tablets at bedtime. 90 tablet 0  . Eluxadoline (VIBERZI) 75 MG TABS Take 1 tablet by mouth 2 (two) times daily. 60 tablet 0  . KOMBIGLYZE XR 05-998 MG TB24 TAKE ONE TABLET BY MOUTH ONCE DAILY AFTER  LARGEST  MEAL 30 tablet 3  . losartan (COZAAR) 50 MG tablet TAKE 1 TABLET (50 MG TOTAL) BY MOUTH DAILY. 30 tablet 6  . Probiotic Product (PROBIOTIC PO) Take 1 tablet by mouth every morning.    . simvastatin (ZOCOR) 20 MG tablet Take 1 tablet (20 mg total) by mouth at bedtime. 30 tablet 5  . VICTOZA 18 MG/3ML SOPN INJECT 1.8 MG INTO SKIN DAILY 9 pen 3   No current facility-administered medications on file prior to visit.    ALLERGIES: Allergies  Allergen Reactions  . Hydrocodone-Acetaminophen Itching    Itches all over; without rash per pt    FAMILY HISTORY: Family History    Problem Relation Age of Onset  . Colon cancer Neg Hx   . Heart attack Neg Hx   . Stroke Neg Hx   . Cancer Neg Hx   . Colon polyps Brother   . Colon polyps Mother   . Diabetes Sister   . Diabetes Paternal Aunt   . Ulcers Father   . Esophageal cancer Brother   . Kidney cancer Sister   . Gallbladder disease Neg Hx     SOCIAL HISTORY: Social History   Social History  . Marital Status: Widowed    Spouse Name: N/A  . Number of Children: 3  . Years of Education: college   Occupational History  . Retired    Social History  Main Topics  . Smoking status: Never Smoker   . Smokeless tobacco: Never Used  . Alcohol Use: No  . Drug Use: No  . Sexual Activity: Not on file   Other Topics Concern  . Not on file   Social History Narrative   Illicit drug use- no   Patient does not get regular exercise due to knee.   Patient lives at home alone.    Patient son and wife lives next door.   Patient has 3 children.    Patient has some college.    Patient retired May 2014.     REVIEW OF SYSTEMS: Constitutional: No fevers, chills, or sweats, no generalized fatigue, change in appetite Eyes: No visual changes, double vision, eye pain Ear, nose and throat: No hearing loss, ear pain, nasal congestion, sore throat Cardiovascular: No chest pain, palpitations Respiratory:  No shortness of breath at rest or with exertion, wheezes GastrointestinaI: No nausea, vomiting, diarrhea, abdominal pain, fecal incontinence Genitourinary:  No dysuria, urinary retention or frequency Musculoskeletal:  No neck pain, back pain Integumentary: No rash, pruritus, skin lesions Neurological: as above Psychiatric: No depression, insomnia, anxiety Endocrine: No palpitations, fatigue, diaphoresis, mood swings, change in appetite, change in weight, increased thirst Hematologic/Lymphatic:  No anemia, purpura, petechiae. Allergic/Immunologic: no itchy/runny eyes, nasal congestion, recent allergic reactions,  rashes  PHYSICAL EXAM: Filed Vitals:   11/28/14 1029  BP: 132/84  Pulse: 77  Resp: 16   General: No acute distress Head:  Normocephalic/atraumatic Eyes: Fundoscopic exam shows bilateral sharp discs, no vessel changes, exudates, or hemorrhages Neck: supple, no paraspinal tenderness, full range of motion Back: No paraspinal tenderness Heart: regular rate and rhythm Lungs: Clear to auscultation bilaterally. Vascular: No carotid bruits. Skin/Extremities: No rash, no edema Neurological Exam: Mental status: alert and oriented to person, place, and time, no dysarthria or aphasia, Fund of knowledge is appropriate.  Recent and remote memory are intact.  Attention and concentration are normal.    Able to name objects and repeat phrases. Cranial nerves: CN I: not tested CN II: pupils equal, round and reactive to light, visual fields intact, fundi unremarkable. CN III, IV, VI:  full range of motion, no nystagmus, no ptosis CN V: facial sensation intact CN VII: upper and lower face symmetric CN VIII: hearing intact to finger rub CN IX, X: gag intact, uvula midline CN XI: sternocleidomastoid and trapezius muscles intact CN XII: tongue midline Bulk & Tone: normal, no fasciculations. Motor: 5/5 throughout with no pronator drift. Sensation: intact to light touch, cold, pin, vibration and joint position sense.  No extinction to double simultaneous stimulation.  Romberg test negative Deep Tendon Reflexes: +2 throughout, no ankle clonus Plantar responses: downgoing bilaterally Cerebellar: no incoordination on finger to nose, heel to shin. No dysdiadochokinesia Gait: narrow-based and steady, able to tandem walk adequately. Tremor: none  IMPRESSION: This is a pleasant 64 year old right-handed woman with a history of hypertension, hyperlipidemia, obesity, sleep apnea on CPAP, CAD s/p MI, presenting to establish care for seizures and migraines. She has had only 4 GTCs in her lifetime, none since 2002  since starting Depakote. Seizures suggestive of localization-related epilepsy, she reports previous EEG and brain imaging normal. She continues to have frequent migraines and will increase Depakote ER to 1000mg  BID. She is aware of [place] laws to stop driving after a seizure until 6 months seizure-free. She will keep a headache diary and follow-up in 3 months.   Thank you for allowing me to participate in the  care of this patient. Please do not hesitate to call for any questions or concerns.   Ellouise Newer, M.D.  CC: Dr. Linna Darner

## 2014-12-04 DIAGNOSIS — G40009 Localization-related (focal) (partial) idiopathic epilepsy and epileptic syndromes with seizures of localized onset, not intractable, without status epilepticus: Secondary | ICD-10-CM | POA: Insufficient documentation

## 2014-12-04 DIAGNOSIS — G43109 Migraine with aura, not intractable, without status migrainosus: Secondary | ICD-10-CM | POA: Insufficient documentation

## 2014-12-20 ENCOUNTER — Ambulatory Visit: Payer: 59 | Admitting: Gastroenterology

## 2014-12-23 ENCOUNTER — Telehealth: Payer: Self-pay

## 2014-12-23 MED ORDER — ELUXADOLINE 75 MG PO TABS: 1.0000 | ORAL_TABLET | Freq: Two times a day (BID) | ORAL | Status: DC

## 2014-12-23 NOTE — Telephone Encounter (Signed)
Received a prior auth request for [employer] which should have been covered by savings card as documented in October.  Spoke with Joe, the rep, who suggested that I send a new rx in for the new 90 day for $30 deal that has just started and then he will go to the pharmacy and try to get it to go through with the pharmacist.  He will call me back after he has done so.

## 2014-12-24 NOTE — Telephone Encounter (Signed)
Spoke with Wille Glaser ([employer] rep) who told me that he went to patient's pharmacy and was able to make the new 90 day supply for $30 deal worked and went through the system successfully.  I called patient and relayed this information.  Pharmacy is having to order the rx and will most likely arrive at pharmacy at 3:00pm today.  I told patient to call the pharmacy to make sure it was ready to be picked up and to call me or Estill Bamberg if there were any problems.  Patient agreed

## 2014-12-31 ENCOUNTER — Telehealth: Payer: Self-pay

## 2014-12-31 NOTE — Telephone Encounter (Signed)
Left Voice Mail for pt to call back.   RE: Flu Vaccine for 2016  

## 2015-01-29 ENCOUNTER — Telehealth: Payer: Self-pay | Admitting: Emergency Medicine

## 2015-01-29 NOTE — Telephone Encounter (Signed)
Pt will need to establish before having refill sent in. Pt is needing refill for BD Ultra-fine pen NDL [id number]

## 2015-02-03 ENCOUNTER — Telehealth: Payer: Self-pay

## 2015-02-03 MED ORDER — INSULIN PEN NEEDLE 31G X 5 MM MISC: Status: DC

## 2015-02-03 NOTE — Telephone Encounter (Signed)
Bd injection needles sent to pharm, appt made to establish care with dr burns

## 2015-02-17 ENCOUNTER — Ambulatory Visit (INDEPENDENT_AMBULATORY_CARE_PROVIDER_SITE_OTHER): Payer: BLUE CROSS/BLUE SHIELD | Admitting: Family

## 2015-02-17 ENCOUNTER — Encounter: Payer: Self-pay | Admitting: Family

## 2015-02-17 ENCOUNTER — Other Ambulatory Visit (INDEPENDENT_AMBULATORY_CARE_PROVIDER_SITE_OTHER): Payer: BLUE CROSS/BLUE SHIELD

## 2015-02-17 VITALS — BP 108/80 | HR 84 | Temp 97.7°F | Resp 16 | Ht 63.0 in | Wt 306.0 lb

## 2015-02-17 DIAGNOSIS — R5383 Other fatigue: Secondary | ICD-10-CM | POA: Diagnosis not present

## 2015-02-17 LAB — IBC PANEL
IRON: 54 ug/dL (ref 42–145)
Saturation Ratios: 10.4 % — ABNORMAL LOW (ref 20.0–50.0)
Transferrin: 372 mg/dL — ABNORMAL HIGH (ref 212.0–360.0)

## 2015-02-17 LAB — B12 AND FOLATE PANEL
FOLATE: 14 ng/mL (ref >5.9)
Vitamin B-12: 418 pg/mL (ref 211–911)

## 2015-02-17 LAB — HEMOGLOBIN A1C: Hgb A1c MFr Bld: 6.9 % — ABNORMAL HIGH (ref 4.6–6.5)

## 2015-02-17 LAB — CBC
HCT: 40.8 % (ref 36.0–46.0)
HEMOGLOBIN: 13.4 g/dL (ref 12.0–15.0)
MCHC: 32.8 g/dL (ref 30.0–36.0)
MCV: 84.3 fl (ref 78.0–100.0)
PLATELETS: 200 10*3/uL (ref 150.0–400.0)
RBC: 4.84 Mil/uL (ref 3.87–5.11)
RDW: 15.7 % — ABNORMAL HIGH (ref 11.5–15.5)
WBC: 7.3 10*3/uL (ref 4.0–10.5)

## 2015-02-17 LAB — TSH: TSH: 7.32 u[IU]/mL — AB (ref 0.35–4.50)

## 2015-02-17 NOTE — Progress Notes (Signed)
Subjective:    Patient ID: Alexis Grant, female    DOB: 02/11/1950, 65 y.o.   MRN: [id number]  Chief Complaint  Patient presents with  . Fatigue    x6 weeks has been having issues with being very fatigue, little energy, all she wants to do is sleep and she sleeps anywhere from 14-18 hours a day, wants to make sure she does not have a vitamin def of some sort    HPI:  Alexis Grant is a 65 y.o. female who  has a past medical history of Depression; Seizure disorder ([place]); Dyslipidemia; Arthritis; OSA (obstructive sleep apnea); Obesity; Hiatal hernia; Hemangioma; Myocardial infarct ([facility]); GERD (gastroesophageal reflux disease); Gastritis; Diabetes mellitus; Hypothyroidism (2010); IBS (irritable bowel syndrome); Anemia; Hyperlipidemia; Hypertension; Adenomatous polyp of colon (2008); Migraines; Fibromyalgia; Gallstones; Pneumonia; and Urinary tract infection. and presents today for an office visit.  This is a new problem. Associated symptom of fatigue and little energy has been going on for about 6 weeks. Does have sleep apnea and is compliant with her machine use. Previously diagnosed with anemia as well. Severity is significant enough that she can sleep up to 18 hours per day. Does have shortness of breath with activities of daily living. Course of the symptoms has progressively worsened since initial onset. No chest pain. No modifying factors that make it better or worse.   Allergies  Allergen Reactions  . Hydrocodone-Acetaminophen Itching    Itches all over; without rash per pt     Current Outpatient Prescriptions on File Prior to Visit  Medication Sig Dispense Refill  . citalopram (CELEXA) 20 MG tablet TAKE 1 TABLET (20 MG TOTAL) BY MOUTH DAILY. 30 tablet 5  . divalproex (DEPAKOTE ER) 500 MG 24 hr tablet Take 2 tablets twice a day 120 tablet 11  . Eluxadoline (VIBERZI) 75 MG TABS Take 1 tablet by mouth 2 (two) times daily. 180 tablet 1  . Insulin Pen Needle (B-D UF III MINI PEN  NEEDLES) 31G X 5 MM MISC Patient uses to inject insulin as instructed. Dx: e11.09 90 each 0  . KOMBIGLYZE XR 05-998 MG TB24 TAKE ONE TABLET BY MOUTH ONCE DAILY AFTER  LARGEST  MEAL 30 tablet 3  . losartan (COZAAR) 50 MG tablet TAKE 1 TABLET (50 MG TOTAL) BY MOUTH DAILY. 30 tablet 6  . Probiotic Product (PROBIOTIC PO) Take 1 tablet by mouth every morning.    . simvastatin (ZOCOR) 20 MG tablet Take 1 tablet (20 mg total) by mouth at bedtime. 30 tablet 5  . VICTOZA 18 MG/3ML SOPN INJECT 1.8 MG INTO SKIN DAILY 9 pen 3   No current facility-administered medications on file prior to visit.    Past Medical History  Diagnosis Date  . Depression   . Seizure disorder ([place])   . Dyslipidemia   . Arthritis   . OSA (obstructive sleep apnea)   . Obesity   . Hiatal hernia   . Hemangioma   . Myocardial infarct ([facility])     subendocardrial, following R TKR  . GERD (gastroesophageal reflux disease)   . Gastritis   . Diabetes mellitus   . Hypothyroidism 2010    Dr.Deveshawr  . IBS (irritable bowel syndrome)     Dr Fuller Plan  . Anemia   . Hyperlipidemia   . Hypertension   . Adenomatous polyp of colon 2008  . Migraines   . Fibromyalgia   . Gallstones   . Pneumonia   . Urinary tract infection  Review of Systems  Constitutional: Positive for fatigue. Negative for fever and chills.  Respiratory: Positive for shortness of breath. Negative for chest tightness.   Cardiovascular: Negative for chest pain, palpitations and leg swelling.  Endocrine: Positive for cold intolerance. Negative for heat intolerance.  Neurological: Negative for weakness and numbness.      Objective:    BP 108/80 mmHg  Pulse 84  Temp(Src) 97.7 F (36.5 C) (Oral)  Resp 16  Ht 5\' 3"  (1.6 m)  Wt 306 lb (138.801 kg)  BMI 54.22 kg/m2  SpO2 98% Nursing note and vital signs reviewed.  Physical Exam  Constitutional: She is oriented to person, place, and time. She appears well-developed and well-nourished. No distress.    HENT:  Right Ear: Hearing, tympanic membrane, external ear and ear canal normal.  Left Ear: Hearing, tympanic membrane, external ear and ear canal normal.  Nose: Nose normal.  Mouth/Throat: Uvula is midline, oropharynx is clear and moist and mucous membranes are normal.  Cardiovascular: Normal rate, regular rhythm, normal heart sounds and intact distal pulses.   Pulmonary/Chest: Effort normal and breath sounds normal.  Neurological: She is alert and oriented to person, place, and time.  Skin: Skin is warm and dry. There is pallor.  Psychiatric: She has a normal mood and affect. Her behavior is normal. Judgment and thought content normal.       Assessment & Plan:   Problem List Items Addressed This Visit      Other   Other fatigue - Primary    Symptoms of fatigue of possible metabolic origin given pallor on examination. Obtain TSH, methylmalonic acid, IBC panel, hemoglobin A1c, CBC, and B 12/folate. Cannot rule out underlying anxiety, depression, or cardiovascular disease. Also concern for potential leukemias.  Information regarding fatigue discussed with patient. Continue current CPAP as prescribed. Follow-up pending completion of blood work.      Relevant Orders   B12 and Folate Panel (Completed)   CBC (Completed)   Hemoglobin A1c (Completed)   IBC panel (Completed)   Methylmalonic acid, serum   TSH (Completed)   Vitamin D 1,25 dihydroxy   Ferritin

## 2015-02-17 NOTE — Assessment & Plan Note (Addendum)
Symptoms of fatigue of possible metabolic origin given pallor on examination. Obtain TSH, methylmalonic acid, IBC panel, hemoglobin A1c, CBC, and B 12/folate. Cannot rule out underlying anxiety, depression, or cardiovascular disease. Also concern for potential leukemias.  Information regarding fatigue discussed with patient. Continue current CPAP as prescribed. Follow-up pending completion of blood work.

## 2015-02-17 NOTE — Patient Instructions (Signed)
Thank you for choosing [facility].  Summary/Instructions:  If your symptoms worsen or fail to improve, please contact our office for further instruction, or in case of emergency go directly to the emergency room at the closest medical facility.   Fatigue Fatigue is feeling tired all of the time, a lack of energy, or a lack of motivation. Occasional or mild fatigue is often a normal response to activity or life in general. However, long-lasting (chronic) or extreme fatigue may indicate an underlying medical condition. HOME CARE INSTRUCTIONS  Watch your fatigue for any changes. The following actions may help to lessen any discomfort you are feeling:  Talk to your health care provider about how much sleep you need each night. Try to get the required amount every night.  Take medicines only as directed by your health care provider.  Eat a healthy and nutritious diet. Ask your health care provider if you need help changing your diet.  Drink enough fluid to keep your urine clear or pale yellow.  Practice ways of relaxing, such as yoga, meditation, massage therapy, or acupuncture.  Exercise regularly.   Change situations that cause you stress. Try to keep your work and personal routine reasonable.  Do not abuse illegal drugs.  Limit alcohol intake to no more than 1 drink per day for nonpregnant women and 2 drinks per day for men. One drink equals 12 ounces of beer, 5 ounces of wine, or 1 ounces of hard liquor.  Take a multivitamin, if directed by your health care provider. SEEK MEDICAL CARE IF:   Your fatigue does not get better.  You have a fever.   You have unintentional weight loss or gain.  You have headaches.   You have difficulty:   Falling asleep.  Sleeping throughout the night.  You feel angry, guilty, anxious, or sad.   You are unable to have a bowel movement (constipation).   You skin is dry.   Your legs or another part of your body is swollen.   SEEK IMMEDIATE MEDICAL CARE IF:   You feel confused.   Your vision is blurry.  You feel faint or pass out.   You have a severe headache.   You have severe abdominal, pelvic, or back pain.   You have chest pain, shortness of breath, or an irregular or fast heartbeat.   You are unable to urinate or you urinate less than normal.   You develop abnormal bleeding, such as bleeding from the rectum, vagina, nose, lungs, or nipples.  You vomit blood.   You have thoughts about harming yourself or committing suicide.   You are worried that you might harm someone else.    This information is not intended to replace advice given to you by your health care provider. Make sure you discuss any questions you have with your health care provider.   Document Released: 11/01/2006 Document Revised: 01/25/2014 Document Reviewed: 05/08/2013 Elsevier Interactive Patient Education 2016 [employer]   

## 2015-02-17 NOTE — Progress Notes (Signed)
Pre visit review using our clinic review tool, if applicable. No additional management support is needed unless otherwise documented below in the visit note. 

## 2015-02-19 LAB — METHYLMALONIC ACID, SERUM: METHYLMALONIC ACID, QUANT: 109 nmol/L (ref 87–318)

## 2015-02-20 ENCOUNTER — Encounter: Payer: Self-pay | Admitting: Family

## 2015-02-20 MED ORDER — LEVOTHYROXINE SODIUM 25 MCG PO TABS: 25.0000 ug | ORAL_TABLET | Freq: Every day | ORAL | Status: DC

## 2015-02-27 ENCOUNTER — Other Ambulatory Visit: Payer: Self-pay | Admitting: *Deleted

## 2015-02-27 MED ORDER — LOSARTAN POTASSIUM 50 MG PO TABS: ORAL_TABLET | ORAL | Status: DC

## 2015-03-02 ENCOUNTER — Other Ambulatory Visit: Payer: Self-pay | Admitting: Internal Medicine

## 2015-03-04 ENCOUNTER — Other Ambulatory Visit: Payer: Self-pay | Admitting: General Practice

## 2015-03-04 MED ORDER — SIMVASTATIN 20 MG PO TABS: 20.0000 mg | ORAL_TABLET | Freq: Every day | ORAL | Status: DC

## 2015-03-04 MED ORDER — CITALOPRAM HYDROBROMIDE 20 MG PO TABS: 20.0000 mg | ORAL_TABLET | Freq: Every day | ORAL | Status: DC

## 2015-03-05 ENCOUNTER — Other Ambulatory Visit: Payer: Self-pay

## 2015-03-05 MED ORDER — LIRAGLUTIDE 18 MG/3ML ~~LOC~~ SOPN: PEN_INJECTOR | SUBCUTANEOUS | Status: DC

## 2015-03-25 ENCOUNTER — Ambulatory Visit: Payer: 59 | Admitting: Neurology

## 2015-03-25 DIAGNOSIS — Z029 Encounter for administrative examinations, unspecified: Secondary | ICD-10-CM

## 2015-04-02 LAB — HM DIABETES EYE EXAM

## 2015-04-07 ENCOUNTER — Encounter: Payer: Self-pay | Admitting: Internal Medicine

## 2015-04-18 ENCOUNTER — Ambulatory Visit (INDEPENDENT_AMBULATORY_CARE_PROVIDER_SITE_OTHER): Payer: BLUE CROSS/BLUE SHIELD | Admitting: Internal Medicine

## 2015-04-18 ENCOUNTER — Encounter: Payer: Self-pay | Admitting: Internal Medicine

## 2015-04-18 ENCOUNTER — Other Ambulatory Visit (INDEPENDENT_AMBULATORY_CARE_PROVIDER_SITE_OTHER): Payer: BLUE CROSS/BLUE SHIELD

## 2015-04-18 VITALS — BP 122/76 | HR 79 | Temp 97.9°F | Resp 18 | Wt 303.0 lb

## 2015-04-18 DIAGNOSIS — E785 Hyperlipidemia, unspecified: Secondary | ICD-10-CM | POA: Diagnosis not present

## 2015-04-18 DIAGNOSIS — E1151 Type 2 diabetes mellitus with diabetic peripheral angiopathy without gangrene: Secondary | ICD-10-CM

## 2015-04-18 DIAGNOSIS — R5383 Other fatigue: Secondary | ICD-10-CM

## 2015-04-18 DIAGNOSIS — I10 Essential (primary) hypertension: Secondary | ICD-10-CM | POA: Diagnosis not present

## 2015-04-18 DIAGNOSIS — E039 Hypothyroidism, unspecified: Secondary | ICD-10-CM

## 2015-04-18 DIAGNOSIS — Z139 Encounter for screening, unspecified: Secondary | ICD-10-CM

## 2015-04-18 DIAGNOSIS — F329 Major depressive disorder, single episode, unspecified: Secondary | ICD-10-CM

## 2015-04-18 DIAGNOSIS — F32A Depression, unspecified: Secondary | ICD-10-CM

## 2015-04-18 DIAGNOSIS — R251 Tremor, unspecified: Secondary | ICD-10-CM | POA: Insufficient documentation

## 2015-04-18 LAB — COMPREHENSIVE METABOLIC PANEL
ALBUMIN: 3.8 g/dL (ref 3.5–5.2)
ALK PHOS: 52 U/L (ref 39–117)
ALT: 10 U/L (ref 0–35)
AST: 13 U/L (ref 0–37)
BILIRUBIN TOTAL: 0.4 mg/dL (ref 0.2–1.2)
BUN: 8 mg/dL (ref 6–23)
CO2: 31 mEq/L (ref 19–32)
Calcium: 9.3 mg/dL (ref 8.4–10.5)
Chloride: 98 mEq/L (ref 96–112)
Creatinine, Ser: 0.63 mg/dL (ref 0.40–1.20)
GFR: 101 mL/min (ref >60.00)
Glucose, Bld: 107 mg/dL — ABNORMAL HIGH (ref 70–99)
POTASSIUM: 4.4 meq/L (ref 3.5–5.1)
SODIUM: 135 meq/L (ref 135–145)
TOTAL PROTEIN: 6.7 g/dL (ref 6.0–8.3)

## 2015-04-18 LAB — HEPATITIS C ANTIBODY: HCV AB: NEGATIVE

## 2015-04-18 LAB — TSH: TSH: 4.82 u[IU]/mL — ABNORMAL HIGH (ref 0.35–4.50)

## 2015-04-18 LAB — LIPID PANEL
CHOL/HDL RATIO: 3
CHOLESTEROL: 149 mg/dL (ref 0–200)
HDL: 49 mg/dL (ref >39.00)
LDL CALC: 81 mg/dL (ref 0–99)
NonHDL: 99.91
TRIGLYCERIDES: 93 mg/dL (ref 0.0–149.0)
VLDL: 18.6 mg/dL (ref 0.0–40.0)

## 2015-04-18 LAB — HEMOGLOBIN A1C: Hgb A1c MFr Bld: 6.2 % (ref 4.6–6.5)

## 2015-04-18 MED ORDER — DULOXETINE HCL 30 MG PO CPEP: 30.0000 mg | ORAL_CAPSULE | Freq: Every day | ORAL | Status: DC

## 2015-04-18 NOTE — Assessment & Plan Note (Signed)
May be contributing to her fatigue Check tsh  Titrate med dose if needed

## 2015-04-18 NOTE — Progress Notes (Signed)
Pre visit review using our clinic review tool, if applicable. No additional management support is needed unless otherwise documented below in the visit note. 

## 2015-04-18 NOTE — Assessment & Plan Note (Addendum)
Sugars have been well controlled Recheck A1c today Continue current medications

## 2015-04-18 NOTE — Patient Instructions (Signed)
  Test(s) ordered today. Your results will be released to [place] (or called to you) after review, usually within 72hours after test completion. If any changes need to be made, you will be notified at that same time.   Medications reviewed and updated.  Changes include stopping the celexa and starting the cymbalta.   Your prescription(s) have been submitted to your pharmacy. Please take as directed and contact our office if you believe you are having problem(s) with the medication(s).   Please followup in 4 months

## 2015-04-18 NOTE — Assessment & Plan Note (Signed)
She has been experiencing significant fatigue for a few months-possibly multifactorial Her new IBS medication may be contributing The thyroid is underactive may be contributing She is not sleeping well, but her CPAP has been recently checked She is sedentary during the day and sometimes napping during the day, she is depressed Fatigue overall likely multifactorial  Change antidepressant Check thyroid function Check another basic blood work Advised trying to not sleep during the day and improve her nighttime sleep Eventually she needs to be exercising regularly

## 2015-04-18 NOTE — Assessment & Plan Note (Signed)
Not controlled Discontinue Celexa Restart Cymbalta, which she did do well with before-30 mg daily Titrate it if needed

## 2015-04-18 NOTE — Progress Notes (Signed)
Subjective:    Patient ID: Alexis Grant, female    DOB: 1950-12-16, 65 y.o.   MRN: [id number]  HPI She is here to establish with a new pcp.    Tremors:  She has tremors in her hands.  She has had them for a while, but they have gotten worse over the past couple of months.    Fatigue:  She has been very tired and is almost having difficulty functioning. She feels the fatigue has been bad for about three months.  Prior to that her energy level was prettty good. She was started on thyroid medication recently. She is taking her medication daily.  Hyperlipidemia: She is taking her medication daily. She is compliant with a low fat/cholesterol diet. She is not exercising regularly. She denies myalgias.   IBS-D:  She has been taking Viberzi twice dialy.  She started that about three months ago.  Her stool is not formed and she has a bowel movmeent every other day.  Prior to that she had diarrhea.    Neuro, migraines:  She follws with neurology.  She is taking depakoate daily for her seizure disorder and migraines. She feels her migraines are fairly controlled. She does have chronic headaches..   Depression: She is taking her medication daily as prescribed. She denies any side effects from the medication. She has been on medication for years. She still feels depressed.   Hypertension: She is taking her medication daily. She is compliant with a low sodium diet.  She denies chest pain, palpitations, edema, shortness of breath.  She is not exercising regularly.  She does not monitor her blood pressure at home.    Diabetes: She is taking her medication daily as prescribed. She is compliant with a diabetic diet. She is not exercising regularly. She monitors her sugars and they have been running 130-140.     Medications and allergies reviewed with patient and updated if appropriate.  Patient Active Problem List   Diagnosis Date Noted  . Other fatigue 02/17/2015  . Migraine with aura and without  status migrainosus, not intractable 12/04/2014  . 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 Hx of adenomatous colonic polyps 07/16/2014  . Benign neoplasm of ascending colon 07/16/2014  . Benign neoplasm of descending colon 07/16/2014  . Benign neoplasm of cecum 07/16/2014  . Benign neoplasm of transverse colon 07/16/2014  . Fibromyalgia 05/30/2013  . Generalized convulsive epilepsy ([place]) 10/20/2012  . Migraine without aura 10/20/2012  . Seasonal allergies 12/08/2011  . DM (diabetes mellitus), type 2 with peripheral vascular complications ([place]) 123456  . ANEMIA 01/05/2010  . Essential hypertension 01/05/2010  . Irritable bowel syndrome 01/05/2010  . VERTIGO 09/16/2009  . VITAMIN D DEFICIENCY 02/24/2009  . MYOCARDIAL INFARCTION, ACUTE, SUBENDOCARDIAL 10/04/2008  . Hypothyroidism 03/19/2008  . COLONIC POLYPS, ADENOMATOUS, HX OF 08/07/2007  . HEMANGIOMA 04/26/2007  . Dyslipidemia 04/26/2007  . OBESITY 04/26/2007  . DEPRESSION 04/26/2007  . SLEEP APNEA, OBSTRUCTIVE 04/26/2007  . HIATAL HERNIA 04/26/2007  . FATTY LIVER DISEASE 04/26/2007  . ARTHRITIS 04/26/2007    Current Outpatient Prescriptions on File Prior to Visit  Medication Sig Dispense Refill  . citalopram (CELEXA) 20 MG tablet Take 1 tablet (20 mg total) by mouth daily. 45 tablet 0  . divalproex (DEPAKOTE ER) 500 MG 24 hr tablet Take 2 tablets twice a day 120 tablet 11  . Eluxadoline (VIBERZI) 75 MG TABS  Take 1 tablet by mouth 2 (two) times daily. 180 tablet 1  . Insulin Pen Needle (B-D UF III MINI PEN NEEDLES) 31G X 5 MM MISC Patient uses to inject insulin as instructed. Dx: e11.09 90 each 0  . KOMBIGLYZE XR 05-998 MG TB24 TAKE 1 TABLET BY MOUTH EVERY DAY AFTER LARGEST MEAL 30 tablet 1  . levothyroxine (SYNTHROID, LEVOTHROID) 25 MCG tablet Take 1 tablet (25 mcg total) by mouth daily  before breakfast. 30 tablet 1  . Liraglutide (VICTOZA) 18 MG/3ML SOPN INJECT 1.8 MG INTO SKIN DAILY 9 pen 3  . losartan (COZAAR) 50 MG tablet TAKE 1 TABLET (50 MG TOTAL) BY MOUTH DAILY. 90 tablet 3  . Probiotic Product (PROBIOTIC PO) Take 1 tablet by mouth every morning.    . simvastatin (ZOCOR) 20 MG tablet Take 1 tablet (20 mg total) by mouth at bedtime. 45 tablet 0   No current facility-administered medications on file prior to visit.    Past Medical History  Diagnosis Date  . Depression   . Seizure disorder ([place])   . Dyslipidemia   . Arthritis   . OSA (obstructive sleep apnea)   . Obesity   . Hiatal hernia   . Hemangioma   . Myocardial infarct ([facility])     subendocardrial, following R TKR  . GERD (gastroesophageal reflux disease)   . Gastritis   . Diabetes mellitus   . Hypothyroidism 2010    Dr.Deveshawr  . IBS (irritable bowel syndrome)     Dr Fuller Plan  . Anemia   . Hyperlipidemia   . Hypertension   . Adenomatous polyp of colon 2008  . Migraines   . Fibromyalgia   . Gallstones   . Pneumonia   . Urinary tract infection     Past Surgical History  Procedure Laterality Date  . Cholecystectomy  1985  . Abdominal hysterectomy  1988  . Tubal ligation  1986  . Total knee arthroplasty Right 11/2005  . Cardiac catheterization  2007    DrGamble ( now seeing Dr Tonita Cong cardiology)  . Total knee arthroplasty Left   . Steroid injection to left si joint  12/2008    Dr.Newton  . Polypectomy  2012    6 or 7 adenomatous, Dr.Stark  . Colonoscopy with propofol N/A 07/16/2014    Procedure: COLONOSCOPY WITH PROPOFOL;  Surgeon: Ladene Artist, MD;  Location: [facility];  Service: Endoscopy;  Laterality: N/A;    Social History   Social History  . Marital Status: Widowed    Spouse Name: N/A  . Number of Children: 3  . Years of Education: college   Occupational History  . Retired    Social History Main Topics  . Smoking status: Never Smoker   . Smokeless tobacco: Never  Used  . Alcohol Use: No  . Drug Use: No  . Sexual Activity: Not on file   Other Topics Concern  . Not on file   Social History Narrative   Illicit drug use- no   Patient does not get regular exercise due to knee.   Patient lives at home alone.    Patient son and wife lives next door.   Patient has 3 children.    Patient has some college.    Patient retired May 2014.     Family History  Problem Relation Age of Onset  . Colon cancer Neg Hx   . Heart attack Neg Hx   . Stroke Neg Hx   . Cancer Neg Hx   .  Colon polyps Brother   . Colon polyps Mother   . Diabetes Sister   . Diabetes Paternal Aunt   . Ulcers Father   . Esophageal cancer Brother   . Kidney cancer Sister   . Gallbladder disease Neg Hx     Review of Systems  Constitutional: Positive for appetite change (decreased) and fatigue. Negative for fever and chills.       Hard to get up in the morning, feels groggy in the morning  Respiratory: Negative for cough, shortness of breath and wheezing.   Cardiovascular: Negative for chest pain, palpitations and leg swelling.  Gastrointestinal: Positive for nausea (from gas) and abdominal pain (cramping). Negative for blood in stool.       Gerd , increased gas  Genitourinary: Negative for dysuria and hematuria.  Musculoskeletal: Positive for back pain (right lower back, worse with laying down).  Neurological: Positive for headaches. Negative for dizziness, weakness, light-headedness and numbness.       Off balanced, related to quick movements  Psychiatric/Behavioral: Positive for dysphoric mood. The patient is not nervous/anxious.        Objective:   Filed Vitals:   04/18/15 1034  BP: 122/76  Pulse: 79  Temp: 97.9 F (36.6 C)  Resp: 18   Filed Weights   04/18/15 1034  Weight: 303 lb (137.44 kg)   Body mass index is 53.69 kg/(m^2).   Physical Exam Constitutional: Appears well-developed and well-nourished. No distress.  Neck: Neck supple. No tracheal deviation  present. No thyromegaly present.  No carotid bruit. No cervical adenopathy.   Cardiovascular: Normal rate, regular rhythm and normal heart sounds.   No murmur heard.  No edema Pulmonary/Chest: Effort normal and breath sounds normal. No respiratory distress. No wheezes.  Neuro; bilateral hand tremors at rest  Psych: depressed affect      Assessment & Plan:   See Problem List for Assessment and Plan of chronic medical problems.  Follow-up in 4 months, sooner if needed

## 2015-04-18 NOTE — Assessment & Plan Note (Signed)
BP well controlled Current regimen effective and well tolerated Continue current medications at current doses  

## 2015-04-18 NOTE — Assessment & Plan Note (Signed)
Chronic, but has worsened-may be secondary to her thyroid more one of her medications Check basic blood work and monitor for now Consider medication in the near future if needed

## 2015-04-19 ENCOUNTER — Other Ambulatory Visit: Payer: Self-pay | Admitting: Family

## 2015-04-20 ENCOUNTER — Other Ambulatory Visit: Payer: Self-pay | Admitting: Internal Medicine

## 2015-04-20 MED ORDER — LEVOTHYROXINE SODIUM 50 MCG PO TABS: 50.0000 ug | ORAL_TABLET | Freq: Every day | ORAL | Status: DC

## 2015-04-26 ENCOUNTER — Other Ambulatory Visit: Payer: Self-pay | Admitting: Internal Medicine

## 2015-05-04 ENCOUNTER — Other Ambulatory Visit: Payer: Self-pay | Admitting: Internal Medicine

## 2015-05-26 ENCOUNTER — Other Ambulatory Visit: Payer: Self-pay | Admitting: Internal Medicine

## 2015-06-21 ENCOUNTER — Other Ambulatory Visit: Payer: Self-pay | Admitting: Internal Medicine

## 2015-07-14 ENCOUNTER — Other Ambulatory Visit: Payer: Self-pay | Admitting: Gastroenterology

## 2015-08-12 ENCOUNTER — Telehealth: Payer: Self-pay | Admitting: Emergency Medicine

## 2015-08-12 NOTE — Telephone Encounter (Signed)
Pt need a cheaper prescription for KOMBIGLYZE XR 05-998 MG TB24. [facility] [address] Please follow up thanks.

## 2015-08-14 MED ORDER — METFORMIN HCL ER (MOD) 1000 MG PO TB24: 1000.0000 mg | ORAL_TABLET | Freq: Every day | ORAL | 3 refills | Status: DC

## 2015-08-14 MED ORDER — EMPAGLIFLOZIN 10 MG PO TABS: 10.0000 mg | ORAL_TABLET | Freq: Every day | ORAL | 3 refills | Status: DC

## 2015-08-14 NOTE — Telephone Encounter (Signed)
Lets try switching to metformin XR 1000mg  once a day which is part of the current medication  We can try adding another medication - either jardiance or invokana - it will depend on cost.  I will send jardiance to her pharmacy.  She should call them before going to get it - see how expensive it is --- if it is too expensive we will try the other medication.   If the metformin is too expensive we can do a cheaper version - she will just need to take more pills, more often  Prescriptions sent to [facility]

## 2015-08-14 NOTE — Telephone Encounter (Signed)
Spoke with pt to inform.  

## 2015-08-20 ENCOUNTER — Telehealth: Payer: Self-pay | Admitting: Internal Medicine

## 2015-08-20 NOTE — Telephone Encounter (Signed)
[facility] start on empagliflozin (JARDIANCE) 10 MG TABS tablet . Please help

## 2015-08-21 NOTE — Telephone Encounter (Signed)
Received notification from [employer] stating PA is not required. I contacted pharmacy to advise of same, they re-ran claim and medication was covered. They will notify pt of same

## 2015-08-21 NOTE — Telephone Encounter (Signed)
PA initiated via CoverMyMeds key [id number]

## 2015-08-25 ENCOUNTER — Other Ambulatory Visit: Payer: Self-pay | Admitting: Internal Medicine

## 2015-08-25 DIAGNOSIS — Z1231 Encounter for screening mammogram for malignant neoplasm of breast: Secondary | ICD-10-CM

## 2015-08-29 ENCOUNTER — Ambulatory Visit: Payer: BLUE CROSS/BLUE SHIELD | Admitting: Neurology

## 2015-08-29 DIAGNOSIS — Z029 Encounter for administrative examinations, unspecified: Secondary | ICD-10-CM

## 2015-09-05 ENCOUNTER — Ambulatory Visit
Admission: RE | Admit: 2015-09-05 | Discharge: 2015-09-05 | Disposition: A | Discharge status: Home / Self Care | Payer: BLUE CROSS/BLUE SHIELD | Source: Ambulatory Visit | Attending: Internal Medicine | Admitting: Internal Medicine

## 2015-09-05 DIAGNOSIS — Z1231 Encounter for screening mammogram for malignant neoplasm of breast: Secondary | ICD-10-CM

## 2015-09-15 ENCOUNTER — Other Ambulatory Visit: Payer: Self-pay | Admitting: Internal Medicine

## 2015-10-29 ENCOUNTER — Other Ambulatory Visit: Payer: Self-pay | Admitting: Internal Medicine

## 2015-11-13 ENCOUNTER — Other Ambulatory Visit: Payer: Self-pay | Admitting: Internal Medicine

## 2015-11-13 NOTE — Telephone Encounter (Signed)
Do not see med on current med list. Please advise 

## 2015-11-13 NOTE — Telephone Encounter (Signed)
Refuse refill - no longer on that med

## 2015-11-25 ENCOUNTER — Ambulatory Visit (INDEPENDENT_AMBULATORY_CARE_PROVIDER_SITE_OTHER): Payer: Medicare Other | Admitting: Internal Medicine

## 2015-11-25 ENCOUNTER — Other Ambulatory Visit (INDEPENDENT_AMBULATORY_CARE_PROVIDER_SITE_OTHER): Payer: Medicare Other

## 2015-11-25 ENCOUNTER — Encounter: Payer: Self-pay | Admitting: Internal Medicine

## 2015-11-25 VITALS — BP 128/88 | HR 89 | Temp 98.7°F | Resp 16 | Ht 63.0 in | Wt 298.0 lb

## 2015-11-25 DIAGNOSIS — I10 Essential (primary) hypertension: Secondary | ICD-10-CM

## 2015-11-25 DIAGNOSIS — E119 Type 2 diabetes mellitus without complications: Secondary | ICD-10-CM

## 2015-11-25 DIAGNOSIS — L719 Rosacea, unspecified: Secondary | ICD-10-CM | POA: Insufficient documentation

## 2015-11-25 DIAGNOSIS — E785 Hyperlipidemia, unspecified: Secondary | ICD-10-CM

## 2015-11-25 DIAGNOSIS — F329 Major depressive disorder, single episode, unspecified: Secondary | ICD-10-CM

## 2015-11-25 DIAGNOSIS — E1151 Type 2 diabetes mellitus with diabetic peripheral angiopathy without gangrene: Secondary | ICD-10-CM | POA: Diagnosis not present

## 2015-11-25 DIAGNOSIS — R5383 Other fatigue: Secondary | ICD-10-CM | POA: Diagnosis not present

## 2015-11-25 DIAGNOSIS — F32A Depression, unspecified: Secondary | ICD-10-CM

## 2015-11-25 DIAGNOSIS — E039 Hypothyroidism, unspecified: Secondary | ICD-10-CM | POA: Diagnosis not present

## 2015-11-25 DIAGNOSIS — E559 Vitamin D deficiency, unspecified: Secondary | ICD-10-CM | POA: Diagnosis not present

## 2015-11-25 DIAGNOSIS — Z23 Encounter for immunization: Secondary | ICD-10-CM

## 2015-11-25 DIAGNOSIS — R2689 Other abnormalities of gait and mobility: Secondary | ICD-10-CM | POA: Insufficient documentation

## 2015-11-25 LAB — LIPID PANEL
CHOL/HDL RATIO: 3
Cholesterol: 179 mg/dL (ref 0–200)
HDL: 55.9 mg/dL (ref >39.00)
LDL CALC: 96 mg/dL (ref 0–99)
NonHDL: 123.36
TRIGLYCERIDES: 135 mg/dL (ref 0.0–149.0)
VLDL: 27 mg/dL (ref 0.0–40.0)

## 2015-11-25 LAB — COMPREHENSIVE METABOLIC PANEL
ALK PHOS: 59 U/L (ref 39–117)
ALT: 9 U/L (ref 0–35)
AST: 10 U/L (ref 0–37)
Albumin: 4 g/dL (ref 3.5–5.2)
BUN: 5 mg/dL — AB (ref 6–23)
CHLORIDE: 100 meq/L (ref 96–112)
CO2: 29 mEq/L (ref 19–32)
Calcium: 9.5 mg/dL (ref 8.4–10.5)
Creatinine, Ser: 0.65 mg/dL (ref 0.40–1.20)
GFR: 97.24 mL/min (ref >60.00)
GLUCOSE: 156 mg/dL — AB (ref 70–99)
POTASSIUM: 4.7 meq/L (ref 3.5–5.1)
SODIUM: 137 meq/L (ref 135–145)
TOTAL PROTEIN: 7.1 g/dL (ref 6.0–8.3)
Total Bilirubin: 0.5 mg/dL (ref 0.2–1.2)

## 2015-11-25 LAB — CBC WITH DIFFERENTIAL/PLATELET
BASOS ABS: 0.1 10*3/uL (ref 0.0–0.1)
Basophils Relative: 0.9 % (ref 0.0–3.0)
EOS PCT: 1.2 % (ref 0.0–5.0)
Eosinophils Absolute: 0.1 10*3/uL (ref 0.0–0.7)
HCT: 40.4 % (ref 36.0–46.0)
Hemoglobin: 13.6 g/dL (ref 12.0–15.0)
LYMPHS ABS: 3.3 10*3/uL (ref 0.7–4.0)
Lymphocytes Relative: 39.4 % (ref 12.0–46.0)
MCHC: 33.6 g/dL (ref 30.0–36.0)
MCV: 83.7 fl (ref 78.0–100.0)
MONO ABS: 0.7 10*3/uL (ref 0.1–1.0)
Monocytes Relative: 8.6 % (ref 3.0–12.0)
NEUTROS PCT: 49.9 % (ref 43.0–77.0)
Neutro Abs: 4.2 10*3/uL (ref 1.4–7.7)
Platelets: 251 10*3/uL (ref 150.0–400.0)
RBC: 4.82 Mil/uL (ref 3.87–5.11)
RDW: 14.9 % (ref 11.5–15.5)
WBC: 8.4 10*3/uL (ref 4.0–10.5)

## 2015-11-25 LAB — MICROALBUMIN / CREATININE URINE RATIO
Creatinine,U: 129.4 mg/dL
MICROALB UR: 4.2 mg/dL — AB (ref 0.0–1.9)
Microalb Creat Ratio: 3.2 mg/g (ref 0.0–30.0)

## 2015-11-25 LAB — FERRITIN: FERRITIN: 44.2 ng/mL (ref 10.0–291.0)

## 2015-11-25 LAB — HEMOGLOBIN A1C: Hgb A1c MFr Bld: 6.6 % — ABNORMAL HIGH (ref 4.6–6.5)

## 2015-11-25 LAB — TSH: TSH: 3.08 u[IU]/mL (ref 0.35–4.50)

## 2015-11-25 MED ORDER — METRONIDAZOLE 0.75 % EX GEL: 1.0000 "application " | Freq: Two times a day (BID) | CUTANEOUS | 0 refills | Status: DC

## 2015-11-25 NOTE — Assessment & Plan Note (Signed)
Check lipid, cmp Continue statin

## 2015-11-25 NOTE — Assessment & Plan Note (Signed)
BP well controlled Current regimen effective and well tolerated Continue current medications at current doses cmp  

## 2015-11-25 NOTE — Patient Instructions (Addendum)
  Test(s) ordered today. Your results will be released to [place] (or called to you) after review, usually within 72hours after test completion. If any changes need to be made, you will be notified at that same time.  All other Health Maintenance issues reviewed.   All recommended immunizations and age-appropriate screenings are up-to-date or discussed.  Flu immunization administered today.   Medications reviewed and updated.  Changes include trying metrogel for your face rash.  Call with any questions.    Please followup in 6 months, sooner if needed

## 2015-11-25 NOTE — Assessment & Plan Note (Signed)
Controlled, stable Continue current dose of medication  

## 2015-11-25 NOTE — Assessment & Plan Note (Signed)
Check a1c, urine micro Has had a couple of hypoglycemia episodes, but we are changing medications - she will let me know if she continues to have hypoglycemia Avoid skipping meals, which she does Start more regular exercise Work on weight loss Follow u pin 6 months, sooner if needed

## 2015-11-25 NOTE — Progress Notes (Signed)
Subjective:    Patient ID: Alexis Grant, female    DOB: 19-Oct-1950, 65 y.o.   MRN: [id number]  HPI The patient is here for follow up.  Diabetes: She is taking her medication daily as prescribed. She just finished the kombiglyze and has not started the metformin/jardiance medications.  She is compliant with a diabetic diet. She is exercising minimally due to arthritis. She monitors her sugars and they have been running 120-150. She checks her feet daily and denies foot lesions. She is up-to-date with an ophthalmology examination.   Hypothyroidism:  She is taking her medication daily.  She denies any recent changes in energy or weight that are unexplained.   Hypertension: She is taking her medication daily. She is compliant with a low sodium diet.  She denies chest pain, palpitations, edema, shortness of breath and lightheadedness. She is exercising some.       Hyperlipidemia: She is taking her medication daily. She is compliant with a low fat/cholesterol diet. She is exercising minimally, but is active. She denies myalgias.   She has fallen out of bed three times and has hurt herself.  She is unsure why she has rolled out of bed.    Poor balance:  She has poor balance.  She does some walking and is active.  She is limited in how much walking she can do.    Rash:  She has a facial rash that has been present for at least two months.  She denies changes in her products.  She denies rash outside of her face.   Depression: She is taking her medication daily as prescribed. She denies any side effects from the medication. She feels her depression is well controlled and she is happy with her current dose of medication.    Medications and allergies reviewed with patient and updated if appropriate.  Patient Active Problem List   Diagnosis Date Noted  . Tremor 04/18/2015  . Other fatigue 02/17/2015  . Migraine with aura and without status migrainosus, not intractable 12/04/2014  .  Localization-related (focal) (partial) idiopathic epilepsy and epileptic syndromes with seizures of localized onset, not intractable, without status epilepticus 12/04/2014  . Hx of adenomatous colonic polyps 07/16/2014  . Benign neoplasm of ascending colon 07/16/2014  . Benign neoplasm of descending colon 07/16/2014  . Benign neoplasm of cecum 07/16/2014  . Benign neoplasm of transverse colon 07/16/2014  . Fibromyalgia 05/30/2013  . Generalized convulsive epilepsy ([place]) 10/20/2012  . Migraine without aura 10/20/2012  . Seasonal allergies 12/08/2011  . DM (diabetes mellitus), type 2 with peripheral vascular complications ([place]) 123456  . ANEMIA 01/05/2010  . Essential hypertension 01/05/2010  . Irritable bowel syndrome 01/05/2010  . VERTIGO 09/16/2009  . VITAMIN D DEFICIENCY 02/24/2009  . MYOCARDIAL INFARCTION, ACUTE, SUBENDOCARDIAL 10/04/2008  . Hypothyroidism 03/19/2008  . COLONIC POLYPS, ADENOMATOUS, HX OF 08/07/2007  . HEMANGIOMA 04/26/2007  . Dyslipidemia 04/26/2007  . OBESITY 04/26/2007  . Depression 04/26/2007  . SLEEP APNEA, OBSTRUCTIVE 04/26/2007  . HIATAL HERNIA 04/26/2007  . FATTY LIVER DISEASE 04/26/2007  . ARTHRITIS 04/26/2007    Current Outpatient Prescriptions on File Prior to Visit  Medication Sig Dispense Refill  . B-D UF III MINI PEN NEEDLES 31G X 5 MM MISC USE TO INJECT INSULIN AS DIRECTED 100 each 1  . divalproex (DEPAKOTE ER) 500 MG 24 hr tablet Take 2 tablets twice a day 120 tablet 11  . DULoxetine (CYMBALTA) 30 MG capsule TAKE 1 CAPSULE (30 MG TOTAL) BY MOUTH DAILY.  30 capsule 3  . empagliflozin (JARDIANCE) 10 MG TABS tablet Take 10 mg by mouth daily. 30 tablet 3  . levothyroxine (SYNTHROID, LEVOTHROID) 50 MCG tablet Take 1 tablet (50 mcg total) by mouth daily. 90 tablet 3  . losartan (COZAAR) 50 MG tablet TAKE 1 TABLET (50 MG TOTAL) BY MOUTH DAILY. 90 tablet 3  . metFORMIN (GLUMETZA) 1000 MG (MOD) 24 hr tablet Take 1 tablet (1,000 mg total) by mouth  daily with breakfast. 30 tablet 3  . Probiotic Product (PROBIOTIC PO) Take 1 tablet by mouth every morning.    . simvastatin (ZOCOR) 20 MG tablet TAKE 1 TABLET (20 MG TOTAL) BY MOUTH AT BEDTIME. 90 tablet 3  . VIBERZI 75 MG TABS *PA TAKE 1 TABLET BY MOUTH TWICE A DAY 180 tablet 1  . VICTOZA 18 MG/3ML SOPN INJECT 1.8 MG INTO SKIN DAILY 9 pen 1   No current facility-administered medications on file prior to visit.     Past Medical History:  Diagnosis Date  . Adenomatous polyp of colon 2008  . Anemia   . Arthritis   . Depression   . Diabetes mellitus   . Dyslipidemia   . Fibromyalgia   . Gallstones   . Gastritis   . GERD (gastroesophageal reflux disease)   . Hemangioma   . Hiatal hernia   . Hyperlipidemia   . Hypertension   . Hypothyroidism 2010   Dr.Deveshawr  . IBS (irritable bowel syndrome)    Dr Fuller Plan  . Migraines   . Myocardial infarct    subendocardrial, following R TKR  . Obesity   . OSA (obstructive sleep apnea)   . Pneumonia   . Seizure disorder ([place])   . Urinary tract infection     Past Surgical History:  Procedure Laterality Date  . ABDOMINAL HYSTERECTOMY  1988  . CARDIAC CATHETERIZATION  2007   DrGamble ( now seeing Dr Tonita Cong cardiology)  . CHOLECYSTECTOMY  1985  . COLONOSCOPY WITH PROPOFOL N/A 07/16/2014   Procedure: COLONOSCOPY WITH PROPOFOL;  Surgeon: Ladene Artist, MD;  Location: [facility];  Service: Endoscopy;  Laterality: N/A;  . POLYPECTOMY  2012   6 or 7 adenomatous, Dr.Stark  . steroid injection to left si joint  12/2008   Dr.Newton  . TOTAL KNEE ARTHROPLASTY Right 11/2005  . TOTAL KNEE ARTHROPLASTY Left   . TUBAL LIGATION  1986    Social History   Social History  . Marital status: Widowed    Spouse name: N/A  . Number of children: 3  . Years of education: college   Occupational History  . Retired    Social History Main Topics  . Smoking status: Never Smoker  . Smokeless tobacco: Never Used  . Alcohol use No  . Drug use:  No  . Sexual activity: Not on file   Other Topics Concern  . Not on file   Social History Narrative   Illicit drug use- no   Patient does not get regular exercise due to knee.   Patient lives at home alone.    Patient son and wife lives next door.   Patient has 3 children.    Patient has some college.    Patient retired May 2014.     Family History  Problem Relation Age of Onset  . Colon cancer Neg Hx   . Heart attack Neg Hx   . Stroke Neg Hx   . Cancer Neg Hx   . Colon polyps Brother   . Colon polyps  Mother   . Diabetes Sister   . Diabetes Paternal Aunt   . Ulcers Father   . Esophageal cancer Brother   . Kidney cancer Sister   . Gallbladder disease Neg Hx     Review of Systems  Constitutional: Negative for fatigue, fever and unexpected weight change.  Respiratory: Positive for cough (related to allergies) and wheezing (related to allergies). Negative for shortness of breath.   Cardiovascular: Negative for chest pain, palpitations and leg swelling.  Gastrointestinal: Positive for abdominal pain (from gas).  Skin: Positive for rash (face).  Neurological: Positive for tremors and headaches. Negative for weakness, light-headedness and numbness.       Objective:   Vitals:   11/25/15 0852  BP: 128/88  Pulse: 89  Resp: 16  Temp: 98.7 F (37.1 C)   Filed Weights   11/25/15 0852  Weight: 298 lb (135.2 kg)   Body mass index is 52.79 kg/m.   Physical Exam    Constitutional: Appears well-developed and well-nourished. No distress.  HENT:  Head: Normocephalic and atraumatic.  Neck: Neck supple. No tracheal deviation present. No thyromegaly present.  No cervical lymphadenopathy Cardiovascular: Normal rate, regular rhythm and normal heart sounds.   No murmur heard. No carotid bruit .  No edema Pulmonary/Chest: Effort normal and breath sounds normal. No respiratory distress. No has no wheezes. No rales.  Skin: Skin is warm and dry. Not diaphoretic. mild erythema on  face to nose and cheeks, several single pustules on cheek and chin Psychiatric: Normal mood and affect. Behavior is normal.    Assessment & Plan:   Flu vaccine today, she will look into shingles vaccine  See Problem List for Assessment and Plan of chronic medical problems.    F/u in 6 months

## 2015-11-25 NOTE — Progress Notes (Signed)
Pre visit review using our clinic review tool, if applicable. No additional management support is needed unless otherwise documented below in the visit note. 

## 2015-11-25 NOTE — Assessment & Plan Note (Signed)
?   Essential tremor vs parkinson's - her dad had parkinson's Will discuss with Dr [name]

## 2015-11-25 NOTE — Assessment & Plan Note (Signed)
Probable rosacea Trial of metrogel If no improvement or if rash worsens  - will see derm

## 2015-11-25 NOTE — Assessment & Plan Note (Signed)
Check tsh  Titrate med dose if needed  

## 2015-11-25 NOTE — Assessment & Plan Note (Signed)
Likely related to osteoarthritis and minimal exercise, but she also has a family hx of parkinsons and has a tremor Will discuss with Dr Kerrin Champagne water exercises Start balance exercise - deferred PT

## 2015-11-26 ENCOUNTER — Telehealth: Payer: Self-pay | Admitting: *Deleted

## 2015-11-26 MED ORDER — SITAGLIPTIN PHOSPHATE 100 MG PO TABS: 100.0000 mg | ORAL_TABLET | Freq: Every day | ORAL | 5 refills | Status: DC

## 2015-11-26 NOTE — Telephone Encounter (Signed)
Rec'd call pt states the Jardiance MD rx yesterday her insurance will not cover. [facility].../lmb

## 2015-11-26 NOTE — Telephone Encounter (Signed)
Lets try [place] - not sure how expensive it is - will send to the pharmacy and she can let us know if it is too expensive

## 2015-11-27 MED ORDER — METFORMIN HCL 1000 MG PO TABS: 1000.0000 mg | ORAL_TABLET | Freq: Every day | ORAL | 1 refills | Status: DC

## 2015-11-27 NOTE — Telephone Encounter (Signed)
Notified pt w/MD response. She states her insurance will cover the januvia picked up yesterday, but the metformin that she sent in the insurance wouldn't cover med cost over a $1000. Inform pt will resend the correct metformin. [facility]. Sent electronically...[name]

## 2015-11-28 LAB — VITAMIN D 1,25 DIHYDROXY
VITAMIN D3 1, 25 (OH): 33 pg/mL
Vitamin D 1, 25 (OH)2 Total: 33 pg/mL (ref 18–72)
Vitamin D2 1, 25 (OH)2: <8 pg/mL

## 2015-11-29 ENCOUNTER — Encounter: Payer: Self-pay | Admitting: Internal Medicine

## 2015-12-15 ENCOUNTER — Other Ambulatory Visit: Payer: Self-pay | Admitting: Neurology

## 2015-12-15 DIAGNOSIS — G40009 Localization-related (focal) (partial) idiopathic epilepsy and epileptic syndromes with seizures of localized onset, not intractable, without status epilepticus: Secondary | ICD-10-CM

## 2015-12-15 DIAGNOSIS — G43109 Migraine with aura, not intractable, without status migrainosus: Secondary | ICD-10-CM

## 2015-12-15 NOTE — Telephone Encounter (Signed)
Refill sent per last OV note patient to continue.

## 2016-01-02 ENCOUNTER — Encounter: Payer: Self-pay | Admitting: Neurology

## 2016-01-02 ENCOUNTER — Ambulatory Visit (INDEPENDENT_AMBULATORY_CARE_PROVIDER_SITE_OTHER): Payer: Medicare Other | Admitting: Neurology

## 2016-01-02 VITALS — BP 122/84 | HR 119 | Ht 63.0 in | Wt 302.1 lb

## 2016-01-02 DIAGNOSIS — G40309 Generalized idiopathic epilepsy and epileptic syndromes, not intractable, without status epilepticus: Secondary | ICD-10-CM | POA: Diagnosis not present

## 2016-01-02 DIAGNOSIS — G43109 Migraine with aura, not intractable, without status migrainosus: Secondary | ICD-10-CM

## 2016-01-02 DIAGNOSIS — R251 Tremor, unspecified: Secondary | ICD-10-CM

## 2016-01-02 MED ORDER — DIVALPROEX SODIUM 500 MG PO DR TAB
DELAYED_RELEASE_TABLET | ORAL | 11 refills | Status: DC
Start: 2016-01-02 — End: 2016-09-01

## 2016-01-02 NOTE — Progress Notes (Signed)
NEUROLOGY FOLLOW UP OFFICE NOTE  Alexis Grant IW:[id number]  HISTORY OF PRESENT ILLNESS: I had the pleasure of seeing Alexis Grant in follow-up in the neurology clinic on 01/02/2016.  The patient was last seen a year ago for seizures and headaches. On her initial visit a year ago, she reported frequent migraines. She has not had any seizures since 2002. She had been taking Depakote ER 500mg  in AM, 1000mg  in PM for the seizures. Dose was increased to 1000mg  BID, and she reports that her migraines are not as bad, they occur every couple of weeks but are not as severe. Last migraine was 2 weeks ago triggered by a bad cold. Migraines last 1-2 days, with associated n[place]asea, photo/phonosensitivity. She takes Excedrin which helps. Her main concern today are worsening tremors. She reports she has always been "shaky," but with her father's history of Parkinson's disease, she became more concerned. Tremors worsened 6 months ago, affecting both hands. She now eats with a spoon because she would drop food off her fork. Hold plates and cups are difficult (water spills). Her sister has Essential Tremor. She denies any leg tremors, she has a history of RLS. She denies any dizziness, diplopia, focal numbness/tingling/weakness, bowel/bladder dysfunction, no falls.  HPI 11/28/2014: This is a 65 yo RH woman with a history of hypertension, hyperlipidemia, diabetes, obesity, sleep apnea, CAD s/p MI, with seizures and migraines. She reports seizures started at age 74, she woke up in the hospital with no prior warning symptoms. Since then, she has had 4 generalized tonic-clonic seizures, last was in 2002. She also report 5 or 6 "little episodes" where she feels dizzy, with blurred vision, then if she sits down quickly and tries to relax, she can avoid any progression. She would feel briefly confused. No associated olfactory/gustatory hallucinations, focal numbness/tingling/weakness, myoclonic jerks. The last time she had these  episodes was at least 12 years ago. She recalls taking Neurontin initially, which did not help. Topamax in the past which caused GI symptoms. She has been taking Depakote ER 500mg  in AM, 1000mg  in PM for many years now with no side effects. She reports having brain imaging and EEG in the past which were normal, no records available for review.   She reports migraines started at age 64. She has an aura of numbness in her face, cheeks, and temple, then she becomes very sensitive to lights, sounds, and smells. She sees flashing lights then her vision becomes blurred, followed by throbbing headaches in the frontal or occipital regions with associated nausea. No associated focal numbness/tingling/weakness in the extremities. She tried Imitrex with minimal effect. A course of Prednisone   She has chronic neck pain and has been dealing with diarrhea from IBS. She had an MI in 2010.   Epilepsy Risk Factors:  There is a strong family history of seizures in her paternal grandfather, paternal aunt, nephew. Otherwise she had a normal birth and early development.  There is no history of febrile convulsions, CNS infections such as meningitis/encephalitis, significant traumatic brain injury, neurosurgical procedures.  PAST MEDICAL HISTORY: Past Medical History:  Diagnosis Date  . Adenomatous polyp of colon 2008  . Anemia   . Arthritis   . Depression   . Diabetes mellitus   . Dyslipidemia   . Fibromyalgia   . Gallstones   . Gastritis   . GERD (gastroesophageal reflux disease)   . Hemangioma   . Hiatal hernia   . Hyperlipidemia   . Hypertension   . Hypothyroidism  2010   Dr.Deveshawr  . IBS (irritable bowel syndrome)    Dr Fuller Plan  . Migraines   . Myocardial infarct    subendocardrial, following R TKR  . Obesity   . OSA (obstructive sleep apnea)   . Pneumonia   . Seizure disorder ([place])   . Urinary tract infection     MEDICATIONS: Current Outpatient Prescriptions on File Prior to Visit    Medication Sig Dispense Refill  . B-D UF III MINI PEN NEEDLES 31G X 5 MM MISC USE TO INJECT INSULIN AS DIRECTED 100 each 1  . divalproex (DEPAKOTE ER) 500 MG 24 hr tablet TAKE 2 TABLETS BY MOUTH TWICE A DAY 120 tablet 8  . DULoxetine (CYMBALTA) 30 MG capsule TAKE 1 CAPSULE (30 MG TOTAL) BY MOUTH DAILY. 30 capsule 3  . levothyroxine (SYNTHROID, LEVOTHROID) 50 MCG tablet Take 1 tablet (50 mcg total) by mouth daily. 90 tablet 3  . losartan (COZAAR) 50 MG tablet TAKE 1 TABLET (50 MG TOTAL) BY MOUTH DAILY. 90 tablet 3  . metFORMIN (GLUCOPHAGE) 1000 MG tablet Take 1 tablet (1,000 mg total) by mouth daily with breakfast. 90 tablet 1  . metroNIDAZOLE (METROGEL) 0.75 % gel Apply 1 application topically 2 (two) times daily. 45 g 0  . Probiotic Product (PROBIOTIC PO) Take 1 tablet by mouth every morning.    . simvastatin (ZOCOR) 20 MG tablet TAKE 1 TABLET (20 MG TOTAL) BY MOUTH AT BEDTIME. 90 tablet 3  . VIBERZI 75 MG TABS *PA TAKE 1 TABLET BY MOUTH TWICE A DAY 180 tablet 1  . VICTOZA 18 MG/3ML SOPN INJECT 1.8 MG INTO SKIN DAILY 9 pen 1   No current facility-administered medications on file prior to visit.     ALLERGIES: Allergies  Allergen Reactions  . Hydrocodone-Acetaminophen Itching    Itches all over; without rash per pt    FAMILY HISTORY: Family History  Problem Relation Age of Onset  . Colon cancer Neg Hx   . Heart attack Neg Hx   . Stroke Neg Hx   . Cancer Neg Hx   . Colon polyps Brother   . Colon polyps Mother   . Diabetes Sister   . Diabetes Paternal Aunt   . Ulcers Father   . Esophageal cancer Brother   . Kidney cancer Sister   . Gallbladder disease Neg Hx     SOCIAL HISTORY: Social History   Social History  . Marital status: Widowed    Spouse name: N/A  . Number of children: 3  . Years of education: college   Occupational History  . Retired    Social History Main Topics  . Smoking status: Never Smoker  . Smokeless tobacco: Never Used  . Alcohol use No  .  Drug use: No  . Sexual activity: Not on file   Other Topics Concern  . Not on file   Social History Narrative   Illicit drug use- no   Patient does not get regular exercise due to knee.   Patient lives at home alone.    Patient son and wife lives next door.   Patient has 3 children.    Patient has some college.    Patient retired May 2014.     REVIEW OF SYSTEMS: Constitutional: No fevers, chills, or sweats, no generalized fatigue, change in appetite Eyes: No visual changes, double vision, eye pain Ear, nose and throat: No hearing loss, ear pain, nasal congestion, sore throat Cardiovascular: No chest pain, palpitations Respiratory:  No shortness  of breath at rest or with exertion, wheezes GastrointestinaI: No nausea, vomiting, diarrhea, abdominal pain, fecal incontinence Genitourinary:  No dysuria, urinary retention or frequency Musculoskeletal:  + neck pain,no back pain Integumentary: No rash, pruritus, skin lesions Neurological: as above Psychiatric: No depression, insomnia, anxiety Endocrine: No palpitations, fatigue, diaphoresis, mood swings, change in appetite, change in weight, increased thirst Hematologic/Lymphatic:  No anemia, purpura, petechiae. Allergic/Immunologic: no itchy/runny eyes, nasal congestion, recent allergic reactions, rashes  PHYSICAL EXAM: Vitals:   01/02/16 1024  BP: 122/84  Pulse: (!) 119   General: No acute distress Head:  Normocephalic/atraumatic Neck: supple, no paraspinal tenderness, full range of motion Heart:  Regular rate and rhythm Lungs:  Clear to auscultation bilaterally Back: No paraspinal tenderness Skin/Extremities: No rash, no edema Neurological Exam: alert and oriented to person, place, and time. No aphasia or dysarthria. Fund of knowledge is appropriate.  Recent and remote memory are intact.  Attention and concentration are normal.    Able to name objects and repeat phrases. Cranial nerves: Pupils equal, round, reactive to light.   Fundoscopic exam unremarkable, no papilledema. Extraocular movements intact with no nystagmus. Visual fields full. Facial sensation intact. No facial asymmetry. Tongue, uvula, palate midline.  Motor: Bulk and tone normal, no cogwheeling,muscle strength 5/5 throughout with no pronator drift.  Sensation to light touch intact.  No extinction to double simultaneous stimulation.  Deep tendon reflexes 2+ throughout, toes downgoing.  Finger to nose testing intact.  Gait narrow-based and steady with good arm swing, no pill rolling tremor, able to tandem walk adequately.  Romberg negative. No resting tremor. +high frequency low amplitude postural >action tremor. No postural instability.  IMPRESSION: This is a pleasant 65 yo RH woman with a history of hypertension, hyperlipidemia, obesity, sleep apnea on CPAP, CAD s/p MI, with a history of convulsive seizures, well-controlled on Depakote with no seizures since 2002. She has had migraines since her teenage years, dose of Depakote was increased to 1000mg  BID a year ago, and she presents today with tremors for the past 6 months. No parkinsonian signs. Tremor likely related to Depakote, she will reduce dose back to 500mg  in AM, 1000mg  in PM. If migraines worsen, consideration for Topamax or Zonisamide will be done on her next visit. Continue to monitor headaches and tremors, she will follow-up in 3 months. She is aware of [place] driving laws to stop driving after a seizure until 6 months seizure-free.  Thank you for allowing me to participate in her care.  Please do not hesitate to call for any questions or concerns.  The duration of this appointment visit was 25 minutes of face-to-face time with the patient.  Greater than 50% of this time was spent in counseling, explanation of diagnosis, planning of further management, and coordination of care.   Ellouise Newer, M.D.   CC: Dr. Quay Burow

## 2016-01-02 NOTE — Patient Instructions (Signed)
1. Reduce Depakote 500mg : Take 1 tablet in AM, 2 tablets in PM 2. Continue to monitor headaches and tremor 3. Follow-up in 3 months, call for any changes  Seizure Precautions: 1. If medication has been prescribed for you to prevent seizures, take it exactly as directed.  Do not stop taking the medicine without talking to your doctor first, even if you have not had a seizure in a long time.   2. Avoid activities in which a seizure would cause danger to yourself or to others.  Don't operate dangerous machinery, swim alone, or climb in high or dangerous places, such as on ladders, roofs, or girders.  Do not drive unless your doctor says you may.  3. If you have any warning that you may have a seizure, lay down in a safe place where you can't hurt yourself.    4.  No driving for 6 months from last seizure, as per [facility].   Please refer to the following link on the [facility] website for more information: [email or web address]/[email or web address]/[email or web address]   5.  Maintain good sleep hygiene. Avoid alcohol.  6.  Contact your doctor if you have any problems that may be related to the medicine you are taking.  7.  Call 911 and bring the patient back to the ED if:        A.  The seizure lasts longer than 5 minutes.       B.  The patient doesn't awaken shortly after the seizure  C.  The patient has new problems such as difficulty seeing, speaking or moving  D.  The patient was injured during the seizure  E.  The patient has a temperature over 102 F (39C)  F.  The patient vomited and now is having trouble breathing

## 2016-01-09 ENCOUNTER — Other Ambulatory Visit: Payer: Self-pay | Admitting: *Deleted

## 2016-01-09 MED ORDER — INSULIN PEN NEEDLE 31G X 5 MM MISC: 1 refills | Status: DC

## 2016-01-14 ENCOUNTER — Other Ambulatory Visit: Payer: Self-pay | Admitting: Emergency Medicine

## 2016-01-14 MED ORDER — INSULIN PEN NEEDLE 31G X 5 MM MISC: 3 refills | Status: DC

## 2016-01-14 NOTE — Telephone Encounter (Signed)
RX faxed to [facility].  

## 2016-02-06 ENCOUNTER — Other Ambulatory Visit: Payer: Self-pay | Admitting: Gastroenterology

## 2016-02-18 ENCOUNTER — Other Ambulatory Visit: Payer: Self-pay | Admitting: Internal Medicine

## 2016-03-23 ENCOUNTER — Other Ambulatory Visit: Payer: Self-pay | Admitting: Gastroenterology

## 2016-03-26 ENCOUNTER — Ambulatory Visit: Payer: Medicare Other | Admitting: Neurology

## 2016-03-31 ENCOUNTER — Other Ambulatory Visit: Payer: Self-pay | Admitting: Internal Medicine

## 2016-04-12 ENCOUNTER — Ambulatory Visit (INDEPENDENT_AMBULATORY_CARE_PROVIDER_SITE_OTHER): Payer: Medicare Other | Admitting: Neurology

## 2016-04-12 ENCOUNTER — Encounter: Payer: Self-pay | Admitting: Neurology

## 2016-04-12 VITALS — BP 130/82 | HR 83 | Temp 98.0°F | Resp 16 | Ht 63.5 in | Wt 282.2 lb

## 2016-04-12 DIAGNOSIS — G40309 Generalized idiopathic epilepsy and epileptic syndromes, not intractable, without status epilepticus: Secondary | ICD-10-CM | POA: Diagnosis not present

## 2016-04-12 DIAGNOSIS — R251 Tremor, unspecified: Secondary | ICD-10-CM

## 2016-04-12 DIAGNOSIS — G43109 Migraine with aura, not intractable, without status migrainosus: Secondary | ICD-10-CM | POA: Diagnosis not present

## 2016-04-12 MED ORDER — TOPIRAMATE 25 MG PO TABS: ORAL_TABLET | ORAL | 6 refills | Status: DC

## 2016-04-12 NOTE — Patient Instructions (Signed)
1. Start Topamax 25mg : Take 1 tablet at night for 1 week, then increase to 2 tablets at night and continue 2. Continue Depakote 500mg  in AM, 1000mg  in PM 3. Follow-up in 4 months, call for any changes  Seizure Precautions: 1. If medication has been prescribed for you to prevent seizures, take it exactly as directed.  Do not stop taking the medicine without talking to your doctor first, even if you have not had a seizure in a long time.   2. Avoid activities in which a seizure would cause danger to yourself or to others.  Don't operate dangerous machinery, swim alone, or climb in high or dangerous places, such as on ladders, roofs, or girders.  Do not drive unless your doctor says you may.  3. If you have any warning that you may have a seizure, lay down in a safe place where you can't hurt yourself.    4.  No driving for 6 months from last seizure, as per [facility].   Please refer to the following link on the [facility] website for more information: [email or web address]/[email or web address]/[email or web address]   5.  Maintain good sleep hygiene. Avoid alcohol.  6.  Contact your doctor if you have any problems that may be related to the medicine you are taking.  7.  Call 911 and bring the patient back to the ED if:        A.  The seizure lasts longer than 5 minutes.       B.  The patient doesn't awaken shortly after the seizure  C.  The patient has new problems such as difficulty seeing, speaking or moving  D.  The patient was injured during the seizure  E.  The patient has a temperature over 102 F (39C)  F.  The patient vomited and now is having trouble breathing

## 2016-04-12 NOTE — Progress Notes (Signed)
NEUROLOGY FOLLOW UP OFFICE NOTE  Alexis Grant [id number]  HISTORY OF PRESENT ILLNESS: I had the pleasure of seeing Alexis Grant in follow-up in the neurology clinic on 04/12/2016.  The patient was last seen 3 months ago for seizures and headaches. On her initial visit in November 2016, she reported frequent migraines. She has not had any seizures since 2002. She had been taking Depakote ER 500mg  in AM, 1000mg  in PM for the seizures. Dose was increased to 1000mg  BID, which helped with the migraines, but caused tremors. Dose reduced back to 515m gin AM, 1000mg  in PM on her last visit. She has not noticed any change with the migraines on lower dose Depakote, she has migraines around once a week, lasting 4-5 hours. Excedrin helps. She has noticed an improvement in the tremors with the lower dose of Depakote, they are better but she still has to use a spoon instead of a fork and has noticed her writing is slower. She is worried when she has to carry heavier bowls. She denies any dizziness, diplopia, focal numbness/tingling/weakness, bowel/bladder dysfunction, no falls.  HPI 11/28/2014: This is a 66 yo RH woman with a history of hypertension, hyperlipidemia, diabetes, obesity, sleep apnea, CAD s/p MI, with seizures and migraines. She reports seizures started at age 51, she woke up in the hospital with no prior warning symptoms. Since then, she has had 4 generalized tonic-clonic seizures, last was in 2002. She also report 5 or 6 "little episodes" where she feels dizzy, with blurred vision, then if she sits down quickly and tries to relax, she can avoid any progression. She would feel briefly confused. No associated olfactory/gustatory hallucinations, focal numbness/tingling/weakness, myoclonic jerks. The last time she had these episodes was at least 12 years ago. She recalls taking Neurontin initially, which did not help. Topamax in the past which caused GI symptoms. She has been taking Depakote ER 500mg  in  AM, 1000mg  in PM for many years now with no side effects. She reports having brain imaging and EEG in the past which were normal, no records available for review.   She reports migraines started at age 39. She has an aura of numbness in her face, cheeks, and temple, then she becomes very sensitive to lights, sounds, and smells. She sees flashing lights then her vision becomes blurred, followed by throbbing headaches in the frontal or occipital regions with associated nausea. No associated focal numbness/tingling/weakness in the extremities. She tried Imitrex with minimal effect. A course of Prednisone   She has chronic neck pain and has been dealing with diarrhea from IBS. She had an MI in 2010.   Epilepsy Risk Factors:  There is a strong family history of seizures in her paternal grandfather, paternal aunt, nephew. Otherwise she had a normal birth and early development.  There is no history of febrile convulsions, CNS infections such as meningitis/encephalitis, significant traumatic brain injury, neurosurgical procedures.  PAST MEDICAL HISTORY: Past Medical History:  Diagnosis Date  . Adenomatous polyp of colon 2008  . Anemia   . Arthritis   . Depression   . Diabetes mellitus   . Dyslipidemia   . Fibromyalgia   . Gallstones   . Gastritis   . GERD (gastroesophageal reflux disease)   . Hemangioma   . Hiatal hernia   . Hyperlipidemia   . Hypertension   . Hypothyroidism 2010   Dr.Deveshawr  . IBS (irritable bowel syndrome)    Dr Fuller Plan  . Migraines   . Myocardial infarct  subendocardrial, following R TKR  . Obesity   . OSA (obstructive sleep apnea)   . Pneumonia   . Seizure disorder ([place])   . Urinary tract infection     MEDICATIONS: Current Outpatient Prescriptions on File Prior to Visit  Medication Sig Dispense Refill  . divalproex (DEPAKOTE) 500 MG DR tablet Take 1 tablet in AM, 2 tablets in PM 90 tablet 11  . DULoxetine (CYMBALTA) 30 MG capsule TAKE ONE CAPSULE BY MOUTH  ONCE DAILY 90 capsule 0  . Insulin Pen Needle (B-D UF III MINI PEN NEEDLES) 31G X 5 MM MISC USE TO INJECT INSULIN DAILY. DX E11.9 . 100 each 3  . levothyroxine (SYNTHROID, LEVOTHROID) 50 MCG tablet Take 1 tablet (50 mcg total) by mouth daily. 90 tablet 3  . losartan (COZAAR) 50 MG tablet TAKE 1 TABLET (50 MG TOTAL) BY MOUTH DAILY. 90 tablet 3  . metFORMIN (GLUCOPHAGE) 1000 MG tablet Take 1 tablet (1,000 mg total) by mouth daily with breakfast. 90 tablet 1  . metroNIDAZOLE (METROGEL) 0.75 % gel Apply 1 application topically 2 (two) times daily. 45 g 0  . Probiotic Product (PROBIOTIC PO) Take 1 tablet by mouth every morning.    . simvastatin (ZOCOR) 20 MG tablet TAKE 1 TABLET (20 MG TOTAL) BY MOUTH AT BEDTIME. 90 tablet 3  . VIBERZI 75 MG TABS *PA TAKE 1 TABLET BY MOUTH TWICE A DAY 180 tablet 1  . VICTOZA 18 MG/3ML SOPN INJECT 1.8 MG INTO THE SKIN DAILY 9 pen 1   No current facility-administered medications on file prior to visit.     ALLERGIES: Allergies  Allergen Reactions  . Hydrocodone-Acetaminophen Itching    Itches all over; without rash per pt    FAMILY HISTORY: Family History  Problem Relation Age of Onset  . Colon polyps Mother   . Ulcers Father   . Colon polyps Brother   . Diabetes Sister   . Diabetes Paternal Aunt   . Esophageal cancer Brother   . Kidney cancer Sister   . Colon cancer Neg Hx   . Heart attack Neg Hx   . Stroke Neg Hx   . Cancer Neg Hx   . Gallbladder disease Neg Hx     SOCIAL HISTORY: Social History   Social History  . Marital status: Widowed    Spouse name: N/A  . Number of children: 3  . Years of education: college   Occupational History  . Retired    Social History Main Topics  . Smoking status: Never Smoker  . Smokeless tobacco: Never Used  . Alcohol use No  . Drug use: No  . Sexual activity: Not on file   Other Topics Concern  . Not on file   Social History Narrative   Illicit drug use- no   Patient does not get regular  exercise due to knee.   Patient lives at home alone.    Patient son and wife lives next door.   Patient has 3 children.    Patient has some college.    Patient retired May 2014.     REVIEW OF SYSTEMS: Constitutional: No fevers, chills, or sweats, no generalized fatigue, change in appetite Eyes: No visual changes, double vision, eye pain Ear, nose and throat: No hearing loss, ear pain, nasal congestion, sore throat Cardiovascular: No chest pain, palpitations Respiratory:  No shortness of breath at rest or with exertion, wheezes GastrointestinaI: No nausea, vomiting, diarrhea, abdominal pain, fecal incontinence Genitourinary:  No dysuria, urinary retention or  frequency Musculoskeletal:  + neck pain,no back pain Integumentary: No rash, pruritus, skin lesions Neurological: as above Psychiatric: No depression, insomnia, anxiety Endocrine: No palpitations, fatigue, diaphoresis, mood swings, change in appetite, change in weight, increased thirst Hematologic/Lymphatic:  No anemia, purpura, petechiae. Allergic/Immunologic: no itchy/runny eyes, nasal congestion, recent allergic reactions, rashes  PHYSICAL EXAM: Vitals:   04/12/16 1040  BP: 130/82  Pulse: 83  Resp: 16  Temp: 98 F (36.7 C)   General: No acute distress Head:  Normocephalic/atraumatic Neck: supple, no paraspinal tenderness, full range of motion Heart:  Regular rate and rhythm Lungs:  Clear to auscultation bilaterally Back: No paraspinal tenderness Skin/Extremities: No rash, no edema Neurological Exam: alert and oriented to person, place, and time. No aphasia or dysarthria. Fund of knowledge is appropriate.  Recent and remote memory are intact.  Attention and concentration are normal.    Able to name objects and repeat phrases. Cranial nerves: Pupils equal, round, reactive to light.  Fundoscopic exam unremarkable, no papilledema. Extraocular movements intact with no nystagmus. Visual fields full. Facial sensation intact. No  facial asymmetry. Tongue, uvula, palate midline.  Motor: Bulk and tone normal, no cogwheeling,muscle strength 5/5 throughout with no pronator drift.  Sensation to light touch intact.  No extinction to double simultaneous stimulation.  Deep tendon reflexes 2+ throughout, toes downgoing.  Finger to nose testing intact.  Gait narrow-based and steady with good arm swing, no pill rolling tremor, able to tandem walk adequately.  Romberg negative. No resting tremor. + mild high frequency low amplitude endpoint > postural tremor. No postural instability. No micrographia, good Archimedes spiral drawing with both hands (see attached).  IMPRESSION: [name] with a history of hypertension, hyperlipidemia, obesity, sleep apnea on CPAP, CAD s/p MI, with a history of convulsive seizures, well-controlled on Depakote with no seizures since 2002. She has had migraines since her teenage years, dose of Depakote was increased to 1000mg  BID but this caused tremors. With reduction of dose back to 500mg  in AM, 1000mg  in PM, she has noticed an improvement in the tremor, but still has it. Exam shows findings consistent with an essential tremor. We discussed medication options, Topamax is second line, but it may help with migraines as well. She will start Topamax 25mg  qhs x 1 week, then increase to 50mg  qhs. Side effects were discussed. We may uptitrate further if tolerated. She is aware of [place] driving laws to stop driving after a seizure until 6 months seizure-free. She will follow-up in 4 months and knows to call for any changes.   Thank you for allowing me to participate in her care.  Please do not hesitate to call for any questions or concerns.  The duration of this appointment visit was 25 minutes of face-to-face time with the patient.  Greater than 50% of this time was spent in counseling, explanation of diagnosis, planning of further management, and coordination of care.   Ellouise Newer, M.D.   CC:  Dr. Quay Burow

## 2016-04-19 NOTE — Progress Notes (Signed)
Subjective:    Patient ID: Alexis Grant, female    DOB: August 06, 1950, 66 y.o.   MRN: [id number]  HPI No show   Medications and allergies reviewed with patient and updated if appropriate.  Patient Active Problem List   Diagnosis Date Noted  . Rosacea 11/25/2015  . Poor balance 11/25/2015  . Tremor 04/18/2015  . Other fatigue 02/17/2015  . Migraine with aura and without status migrainosus, not intractable 12/04/2014  . Localization-related (focal) (partial) idiopathic epilepsy and epileptic syndromes with seizures of localized onset, not intractable, without status epilepticus 12/04/2014  . Hx of adenomatous colonic polyps 07/16/2014  . Benign neoplasm of ascending colon 07/16/2014  . Benign neoplasm of descending colon 07/16/2014  . Benign neoplasm of cecum 07/16/2014  . Benign neoplasm of transverse colon 07/16/2014  . Fibromyalgia 05/30/2013  . Generalized convulsive epilepsy ([place]) 10/20/2012  . Migraine without aura 10/20/2012  . Seasonal allergies 12/08/2011  . DM (diabetes mellitus), type 2 with peripheral vascular complications ([place]) 42/70/6237  . ANEMIA 01/05/2010  . Essential hypertension 01/05/2010  . Irritable bowel syndrome 01/05/2010  . VERTIGO 09/16/2009  . VITAMIN D DEFICIENCY 02/24/2009  . MYOCARDIAL INFARCTION, ACUTE, SUBENDOCARDIAL 10/04/2008  . Hypothyroidism 03/19/2008  . COLONIC POLYPS, ADENOMATOUS, HX OF 08/07/2007  . HEMANGIOMA 04/26/2007  . Dyslipidemia 04/26/2007  . OBESITY 04/26/2007  . Depression 04/26/2007  . SLEEP APNEA, OBSTRUCTIVE 04/26/2007  . HIATAL HERNIA 04/26/2007  . FATTY LIVER DISEASE 04/26/2007  . ARTHRITIS 04/26/2007    Current Outpatient Prescriptions on File Prior to Visit  Medication Sig Dispense Refill  . divalproex (DEPAKOTE) 500 MG DR tablet Take 1 tablet in AM, 2 tablets in PM 90 tablet 11  . DULoxetine (CYMBALTA) 30 MG capsule TAKE ONE CAPSULE BY MOUTH ONCE DAILY 90 capsule 0  . Insulin Pen Needle (B-D UF III MINI  PEN NEEDLES) 31G X 5 MM MISC USE TO INJECT INSULIN DAILY. DX E11.9 . 100 each 3  . levothyroxine (SYNTHROID, LEVOTHROID) 50 MCG tablet Take 1 tablet (50 mcg total) by mouth daily. 90 tablet 3  . losartan (COZAAR) 50 MG tablet TAKE 1 TABLET (50 MG TOTAL) BY MOUTH DAILY. 90 tablet 3  . metFORMIN (GLUCOPHAGE) 1000 MG tablet Take 1 tablet (1,000 mg total) by mouth daily with breakfast. 90 tablet 1  . simvastatin (ZOCOR) 20 MG tablet TAKE 1 TABLET (20 MG TOTAL) BY MOUTH AT BEDTIME. 90 tablet 3  . topiramate (TOPAMAX) 25 MG tablet Take 1 tablet at night for 1 week, then increase to 2 tablets at night 60 tablet 6  . VIBERZI 75 MG TABS *PA TAKE 1 TABLET BY MOUTH TWICE A DAY 180 tablet 1  . VICTOZA 18 MG/3ML SOPN INJECT 1.8 MG INTO THE SKIN DAILY 9 pen 1   No current facility-administered medications on file prior to visit.     Past Medical History:  Diagnosis Date  . Adenomatous polyp of colon 2008  . Anemia   . Arthritis   . Depression   . Diabetes mellitus   . Dyslipidemia   . Fibromyalgia   . Gallstones   . Gastritis   . GERD (gastroesophageal reflux disease)   . Hemangioma   . Hiatal hernia   . Hyperlipidemia   . Hypertension   . Hypothyroidism 2010   Dr.Deveshawr  . IBS (irritable bowel syndrome)    Dr Fuller Plan  . Migraines   . Myocardial infarct    subendocardrial, following R TKR  . Obesity   . OSA (  obstructive sleep apnea)   . Pneumonia   . Seizure disorder ([place])   . Urinary tract infection     Past Surgical History:  Procedure Laterality Date  . ABDOMINAL HYSTERECTOMY  1988  . CARDIAC CATHETERIZATION  2007   DrGamble ( now seeing Dr Tonita Cong cardiology)  . CHOLECYSTECTOMY  1985  . COLONOSCOPY WITH PROPOFOL N/A 07/16/2014   Procedure: COLONOSCOPY WITH PROPOFOL;  Surgeon: Ladene Artist, MD;  Location: [facility];  Service: Endoscopy;  Laterality: N/A;  . POLYPECTOMY  2012   6 or 7 adenomatous, Dr.Stark  . steroid injection to left si joint  12/2008   Dr.Newton    . TOTAL KNEE ARTHROPLASTY Right 11/2005  . TOTAL KNEE ARTHROPLASTY Left   . TUBAL LIGATION  1986    Social History   Social History  . Marital status: Widowed    Spouse name: N/A  . Number of children: 3  . Years of education: college   Occupational History  . Retired    Social History Main Topics  . Smoking status: Never Smoker  . Smokeless tobacco: Never Used  . Alcohol use No  . Drug use: No  . Sexual activity: Not on file   Other Topics Concern  . Not on file   Social History Narrative   Illicit drug use- no   Patient does not get regular exercise due to knee.   Patient lives at home alone.    Patient son and wife lives next door.   Patient has 3 children.    Patient has some college.    Patient retired May 2014.     Family History  Problem Relation Age of Onset  . Colon polyps Mother   . Ulcers Father   . Colon polyps Brother   . Diabetes Sister   . Diabetes Paternal Aunt   . Esophageal cancer Brother   . Kidney cancer Sister   . Colon cancer Neg Hx   . Heart attack Neg Hx   . Stroke Neg Hx   . Cancer Neg Hx   . Gallbladder disease Neg Hx     Review of Systems     Objective:  There were no vitals filed for this visit. There were no vitals filed for this visit. There is no height or weight on file to calculate BMI.  Wt Readings from Last 3 Encounters:  04/12/16 282 lb 3.2 oz (128 kg)  01/02/16 (!) 302 lb 1 oz (137 kg)  11/25/15 298 lb (135.2 kg)     Physical Exam        Assessment & Plan:   See Problem List for Assessment and Plan of chronic medical problems.   This encounter was created in error - please disregard.

## 2016-04-20 ENCOUNTER — Encounter (INDEPENDENT_AMBULATORY_CARE_PROVIDER_SITE_OTHER): Payer: Medicare Other | Admitting: Internal Medicine

## 2016-04-20 DIAGNOSIS — Z0289 Encounter for other administrative examinations: Secondary | ICD-10-CM

## 2016-05-03 ENCOUNTER — Other Ambulatory Visit: Payer: Self-pay | Admitting: Internal Medicine

## 2016-05-04 ENCOUNTER — Encounter: Payer: Self-pay | Admitting: Gastroenterology

## 2016-05-05 ENCOUNTER — Encounter: Payer: Self-pay | Admitting: Gastroenterology

## 2016-05-05 ENCOUNTER — Telehealth: Payer: Self-pay | Admitting: Emergency Medicine

## 2016-05-05 NOTE — Telephone Encounter (Signed)
PA has been started for Victoza, awaiting response.

## 2016-05-23 NOTE — Patient Instructions (Addendum)
Ask pharmacist if trulicity is cheaper for you than victoza -- also ask about saxenda   Test(s) ordered today. Your results will be released to [place] (or called to you) after review, usually within 72hours after test completion. If any changes need to be made, you will be notified at that same time.  All other Health Maintenance issues reviewed.   All recommended immunizations and age-appropriate screenings are up-to-date or discussed.  Prevnar immunization administered today.   Medications reviewed and updated.  Changes include starting trazodone for sleep.  If this does not work we will consider ambien 5 mg.  Your prescription(s) have been submitted to your pharmacy. Please take as directed and contact our office if you believe you are having problem(s) with the medication(s).  A referral was ordered for pulmonary for your sleep apnea  Please followup in 6 months

## 2016-05-23 NOTE — Progress Notes (Signed)
Subjective:    Patient ID: Alexis Grant, female    DOB: 12-05-1950, 66 y.o.   MRN: [id number]  HPI The patient is here for follow up.    Hypertension: She is taking her medication daily. She is compliant with a low sodium diet.  She denies chest pain, palpitations, edema, shortness of breath and lightheadedness. She is not exercising regularly.    Diabetes: She is taking her medication daily as prescribed. She is compliant with a diabetic diet. She is not exercising regularly. She does not monitor her .   [name] She is up-to-date with an ophthalmology examination.   Hyperlipidemia: She is taking her medication daily. She is compliant with a low fat/cholesterol diet. She is not exercising regularly. She denies myalgias.   Hypothyroidism:  She is taking her medication daily.  She denies any recent changes in energy or weight that are unexplained.   Depression: She is taking her medication daily as prescribed. She denies any side effects from the medication. She feels her depression is controlled and she is happy with her current dose of medication.   Difficulty sleeping:  She normally wears a cpap and stopped wearing it - she feels claustrophobic.  She was able to go to sleep right away in the past and the mask did not bother her.  She has a lot of stress which has likely affected her sleep.  She has tried otc sleep medications and they have not helped.     Medications and allergies reviewed with patient and updated if appropriate.  Patient Active Problem List   Diagnosis Date Noted  . Rosacea 11/25/2015  . Poor balance 11/25/2015  . Tremor 04/18/2015  . Other fatigue 02/17/2015  . Migraine with aura and without status migrainosus, not intractable 12/04/2014  . Localization-related (focal) (partial) idiopathic epilepsy and epileptic syndromes with seizures of localized onset, not intractable, without status epilepticus ([place]) 12/04/2014  . Hx of adenomatous colonic polyps 07/16/2014  .  Benign neoplasm of ascending colon 07/16/2014  . Benign neoplasm of descending colon 07/16/2014  . Benign neoplasm of cecum 07/16/2014  . Benign neoplasm of transverse colon 07/16/2014  . Fibromyalgia 05/30/2013  . Generalized convulsive epilepsy ([place]) 10/20/2012  . Migraine without aura 10/20/2012  . Seasonal allergies 12/08/2011  . DM (diabetes mellitus), type 2 with peripheral vascular complications ([place]) 08/65/7846  . Essential hypertension 01/05/2010  . Irritable bowel syndrome 01/05/2010  . VERTIGO 09/16/2009  . VITAMIN D DEFICIENCY 02/24/2009  . MYOCARDIAL INFARCTION, ACUTE, SUBENDOCARDIAL 10/04/2008  . Hypothyroidism 03/19/2008  . COLONIC POLYPS, ADENOMATOUS, HX OF 08/07/2007  . HEMANGIOMA 04/26/2007  . Dyslipidemia 04/26/2007  . OBESITY 04/26/2007  . Depression 04/26/2007  . SLEEP APNEA, OBSTRUCTIVE 04/26/2007  . HIATAL HERNIA 04/26/2007  . FATTY LIVER DISEASE 04/26/2007  . ARTHRITIS 04/26/2007    Current Outpatient Prescriptions on File Prior to Visit  Medication Sig Dispense Refill  . divalproex (DEPAKOTE) 500 MG DR tablet Take 1 tablet in AM, 2 tablets in PM 90 tablet 11  . DULoxetine (CYMBALTA) 30 MG capsule TAKE ONE CAPSULE BY MOUTH ONCE DAILY 90 capsule 0  . Insulin Pen Needle (B-D UF III MINI PEN NEEDLES) 31G X 5 MM MISC USE TO INJECT INSULIN DAILY. DX E11.9 . 100 each 3  . levothyroxine (SYNTHROID, LEVOTHROID) 50 MCG tablet Take 1 tablet (50 mcg total) by mouth daily. 90 tablet 3  . losartan (COZAAR) 50 MG tablet TAKE 1 TABLET (50 MG TOTAL) BY MOUTH DAILY. 90 tablet 3  .  metFORMIN (GLUCOPHAGE) 1000 MG tablet Take 1 tablet (1,000 mg total) by mouth daily with breakfast. 90 tablet 1  . simvastatin (ZOCOR) 20 MG tablet TAKE 1 TABLET (20 MG TOTAL) BY MOUTH AT BEDTIME. 90 tablet 3  . topiramate (TOPAMAX) 25 MG tablet Take 1 tablet at night for 1 week, then increase to 2 tablets at night 60 tablet 6  . VIBERZI 75 MG TABS *PA TAKE 1 TABLET BY MOUTH TWICE A DAY 180  tablet 1  . VICTOZA 18 MG/3ML SOPN INJECT 1.8 MG INTO THE SKIN DAILY 9 pen 0   No current facility-administered medications on file prior to visit.     Past Medical History:  Diagnosis Date  . Adenomatous polyp of colon 2008  . Anemia   . Arthritis   . Depression   . Diabetes mellitus   . Dyslipidemia   . Fibromyalgia   . Gallstones   . Gastritis   . GERD (gastroesophageal reflux disease)   . Hemangioma   . Hiatal hernia   . Hyperlipidemia   . Hypertension   . Hypothyroidism 2010   Dr.Deveshawr  . IBS (irritable bowel syndrome)    Dr Fuller Plan  . Migraines   . Myocardial infarct    subendocardrial, following R TKR  . Obesity   . OSA (obstructive sleep apnea)   . Pneumonia   . Seizure disorder ([place])   . Urinary tract infection     Past Surgical History:  Procedure Laterality Date  . ABDOMINAL HYSTERECTOMY  1988  . CARDIAC CATHETERIZATION  2007   DrGamble ( now seeing Dr Tonita Cong cardiology)  . CHOLECYSTECTOMY  1985  . COLONOSCOPY WITH PROPOFOL N/A 07/16/2014   Procedure: COLONOSCOPY WITH PROPOFOL;  Surgeon: Ladene Artist, MD;  Location: [facility];  Service: Endoscopy;  Laterality: N/A;  . POLYPECTOMY  2012   6 or 7 adenomatous, Dr.Stark  . steroid injection to left si joint  12/2008   Dr.Newton  . TOTAL KNEE ARTHROPLASTY Right 11/2005  . TOTAL KNEE ARTHROPLASTY Left   . TUBAL LIGATION  1986    Social History   Social History  . Marital status: Widowed    Spouse name: N/A  . Number of children: 3  . Years of education: college   Occupational History  . Retired    Social History Main Topics  . Smoking status: Never Smoker  . Smokeless tobacco: Never Used  . Alcohol use No  . Drug use: No  . Sexual activity: Not on file   Other Topics Concern  . Not on file   Social History Narrative   Illicit drug use- no   Patient does not get regular exercise due to knee.   Patient lives at home alone.    Patient son and wife lives next door.   Patient  has 3 children.    Patient has some college.    Patient retired May 2014.     Family History  Problem Relation Age of Onset  . Colon polyps Mother   . Ulcers Father   . Colon polyps Brother   . Diabetes Sister   . Diabetes Paternal Aunt   . Esophageal cancer Brother   . Kidney cancer Sister   . Colon cancer Neg Hx   . Heart attack Neg Hx   . Stroke Neg Hx   . Cancer Neg Hx   . Gallbladder disease Neg Hx     Review of Systems  Constitutional: Negative for chills and fever.  Respiratory: Negative  for cough, shortness of breath and wheezing.   Cardiovascular: Negative for chest pain, palpitations and leg swelling.  Neurological: Positive for headaches. Negative for light-headedness.       Objective:   Vitals:   05/24/16 1024  BP: (!) 142/94  Pulse: 88  Resp: 18  Temp: 97.7 F (36.5 C)   Wt Readings from Last 3 Encounters:  05/24/16 296 lb (134.3 kg)  04/12/16 282 lb 3.2 oz (128 kg)  01/02/16 (!) 302 lb 1 oz (137 kg)   Body mass index is 52.43 kg/m.   Physical Exam    Constitutional: Appears well-developed and well-nourished. No distress.  HENT:  Head: Normocephalic and atraumatic.  Neck: Neck supple. No tracheal deviation present. No thyromegaly present.  No cervical lymphadenopathy Cardiovascular: Normal rate, regular rhythm and normal heart sounds.   No murmur heard. No carotid bruit .  No edema Pulmonary/Chest: Effort normal and breath sounds normal. No respiratory distress. No has no wheezes. No rales.  Skin: Skin is warm and dry. Not diaphoretic.  Psychiatric: Normal mood and affect. Behavior is normal.      Assessment & Plan:   prevnar vaccine given today  See Problem List for Assessment and Plan of chronic medical problems.

## 2016-05-24 ENCOUNTER — Ambulatory Visit (INDEPENDENT_AMBULATORY_CARE_PROVIDER_SITE_OTHER): Payer: Medicare Other | Admitting: Internal Medicine

## 2016-05-24 ENCOUNTER — Encounter: Payer: Self-pay | Admitting: Internal Medicine

## 2016-05-24 VITALS — BP 142/94 | HR 88 | Temp 97.7°F | Resp 18 | Ht 63.0 in | Wt 296.0 lb

## 2016-05-24 DIAGNOSIS — G4733 Obstructive sleep apnea (adult) (pediatric): Secondary | ICD-10-CM | POA: Diagnosis not present

## 2016-05-24 DIAGNOSIS — F329 Major depressive disorder, single episode, unspecified: Secondary | ICD-10-CM

## 2016-05-24 DIAGNOSIS — G479 Sleep disorder, unspecified: Secondary | ICD-10-CM | POA: Insufficient documentation

## 2016-05-24 DIAGNOSIS — I10 Essential (primary) hypertension: Secondary | ICD-10-CM

## 2016-05-24 DIAGNOSIS — E785 Hyperlipidemia, unspecified: Secondary | ICD-10-CM

## 2016-05-24 DIAGNOSIS — F32A Depression, unspecified: Secondary | ICD-10-CM

## 2016-05-24 DIAGNOSIS — Z23 Encounter for immunization: Secondary | ICD-10-CM

## 2016-05-24 DIAGNOSIS — E1151 Type 2 diabetes mellitus with diabetic peripheral angiopathy without gangrene: Secondary | ICD-10-CM | POA: Diagnosis not present

## 2016-05-24 DIAGNOSIS — E039 Hypothyroidism, unspecified: Secondary | ICD-10-CM

## 2016-05-24 MED ORDER — TRAZODONE HCL 50 MG PO TABS: 50.0000 mg | ORAL_TABLET | Freq: Every evening | ORAL | 3 refills | Status: DC | PRN

## 2016-05-24 MED ORDER — LIRAGLUTIDE 18 MG/3ML ~~LOC~~ SOPN: PEN_INJECTOR | SUBCUTANEOUS | 1 refills | Status: DC

## 2016-05-24 MED ORDER — LEVOTHYROXINE SODIUM 50 MCG PO TABS: 50.0000 ug | ORAL_TABLET | Freq: Every day | ORAL | 3 refills | Status: DC

## 2016-05-24 NOTE — Assessment & Plan Note (Signed)
BP slightly elevated here today,but has been controlled  Will just monitor it - no change in medication Continue current medication

## 2016-05-24 NOTE — Assessment & Plan Note (Signed)
Related to increased stress - daughter in law has met ca and is undergoing treatment - she is trying to help out with kids Unable to sleep Has been on ambien 10 mg in the past - worked, but made her groggy in morning [place], BDZ and trazodone all interact with her current medications Will try trazodone 50 mg nightly - if it does not work will try to avoid increasing dose given other medications -- will try ambien 5 mg nightly Long term goal is to get her off the medication

## 2016-05-24 NOTE — Assessment & Plan Note (Signed)
Check a1c Low sugar / carb diet Stressed regular exercise, weight loss  

## 2016-05-24 NOTE — Assessment & Plan Note (Addendum)
Needs new machine  - will refer to pulm

## 2016-05-24 NOTE — Progress Notes (Signed)
Pre visit review using our clinic review tool, if applicable. No additional management support is needed unless otherwise documented below in the visit note. 

## 2016-05-24 NOTE — Assessment & Plan Note (Signed)
Check lipid panel  Continue daily statin Regular exercise and healthy diet encouraged  

## 2016-05-24 NOTE — Addendum Note (Signed)
Addended by: Terence Lux B on: 05/24/2016 01:00 PM   Modules accepted: Orders

## 2016-05-24 NOTE — Assessment & Plan Note (Signed)
Fairly controlled, but anxiety is not which is causing difficulty sleeping Continue cymbalta at current dose

## 2016-05-24 NOTE — Assessment & Plan Note (Signed)
Check tsh  Titrate med dose if needed  

## 2016-05-26 ENCOUNTER — Other Ambulatory Visit: Payer: Self-pay | Admitting: Cardiovascular Disease

## 2016-05-28 NOTE — Telephone Encounter (Signed)
Vitoza PA approved.

## 2016-06-22 ENCOUNTER — Telehealth: Payer: Self-pay | Admitting: Gastroenterology

## 2016-06-22 MED ORDER — ELUXADOLINE 75 MG PO TABS: 1.0000 | ORAL_TABLET | Freq: Two times a day (BID) | ORAL | 0 refills | Status: DC

## 2016-06-22 NOTE — Telephone Encounter (Signed)
Viberzi sent to patient's pharmacy until scheduled Colonoscopy. Informed patient to keep appt for further refills.

## 2016-06-28 ENCOUNTER — Other Ambulatory Visit: Payer: Self-pay | Admitting: Cardiovascular Disease

## 2016-06-28 ENCOUNTER — Other Ambulatory Visit: Payer: Self-pay | Admitting: Internal Medicine

## 2016-07-01 ENCOUNTER — Other Ambulatory Visit: Payer: Self-pay | Admitting: *Deleted

## 2016-07-01 MED ORDER — DULAGLUTIDE 0.75 MG/0.5ML ~~LOC~~ SOAJ: 0.7500 mg | SUBCUTANEOUS | 5 refills | Status: DC

## 2016-07-01 NOTE — Telephone Encounter (Signed)
Stop victoza.  Start trulicity - once a week injection.  Medication pending.

## 2016-07-01 NOTE — Telephone Encounter (Signed)
Rec'd call pt states she spoke w/MD at her last visit about needing to change her Victoza to a less expensive med. She is needing to do that can't pay $300 copay. insurance states trulicity would be a cheaper alternative...[name]

## 2016-07-13 ENCOUNTER — Encounter: Payer: Medicare Other | Admitting: Gastroenterology

## 2016-07-20 ENCOUNTER — Telehealth: Payer: Self-pay | Admitting: *Deleted

## 2016-07-20 NOTE — Telephone Encounter (Signed)
Dr. Fuller Plan,  Patient has BMI of 52.43, is patient okay for direct hospital colonoscopy or does this patient need and OV before scheduling colonoscopy?  Thank you. Olivia Mackie

## 2016-07-25 NOTE — Telephone Encounter (Signed)
OK for direct schedule at [facility].

## 2016-07-26 ENCOUNTER — Other Ambulatory Visit: Payer: Self-pay

## 2016-07-26 DIAGNOSIS — Z1211 Encounter for screening for malignant neoplasm of colon: Secondary | ICD-10-CM

## 2016-07-26 NOTE — Telephone Encounter (Signed)
Pt notified of change in colonoscopy date and location; will keep PV appt as scheduled.  Colonoscopy scheduled for [place] cancelled

## 2016-07-26 NOTE — Telephone Encounter (Signed)
Sheri: pt has PV Wednesday 7/12.  Thanks

## 2016-07-26 NOTE — Telephone Encounter (Signed)
Patient has been scheduled for next available for 09/21/16 9:15.  Please have her arrive at 7:45 am at [facility]

## 2016-07-29 ENCOUNTER — Ambulatory Visit (AMBULATORY_SURGERY_CENTER): Payer: Self-pay | Admitting: *Deleted

## 2016-07-29 VITALS — Ht 63.0 in | Wt 290.2 lb

## 2016-07-29 DIAGNOSIS — Z86010 Personal history of colonic polyps: Secondary | ICD-10-CM

## 2016-07-29 MED ORDER — NA SULFATE-K SULFATE-MG SULF 17.5-3.13-1.6 GM/177ML PO SOLN: 1.0000 | Freq: Once | ORAL | 0 refills | Status: AC

## 2016-07-29 NOTE — Progress Notes (Signed)
No egg or soy allergy known to patient  No issues with past sedation with any surgeries  or procedures, no intubation problems  No diet pills per patient No home 02 use per patient  No blood thinners per patient  Pt denies issues with constipation  No A fib or A flutter  EMMI video sent to pt's e mail  

## 2016-08-05 ENCOUNTER — Other Ambulatory Visit: Payer: Self-pay | Admitting: Cardiovascular Disease

## 2016-08-10 ENCOUNTER — Institutional Professional Consult (permissible substitution): Payer: Medicare Other | Admitting: Internal Medicine

## 2016-08-12 ENCOUNTER — Encounter: Payer: Medicare Other | Admitting: Gastroenterology

## 2016-08-13 ENCOUNTER — Ambulatory Visit: Payer: Medicare Other | Admitting: Neurology

## 2016-08-16 ENCOUNTER — Encounter: Payer: Self-pay | Admitting: Family

## 2016-08-16 ENCOUNTER — Ambulatory Visit (INDEPENDENT_AMBULATORY_CARE_PROVIDER_SITE_OTHER): Payer: Medicare Other | Admitting: Family

## 2016-08-16 ENCOUNTER — Other Ambulatory Visit (INDEPENDENT_AMBULATORY_CARE_PROVIDER_SITE_OTHER): Payer: Medicare Other

## 2016-08-16 VITALS — BP 128/80 | HR 73 | Temp 98.3°F | Resp 18 | Ht 63.0 in | Wt 291.8 lb

## 2016-08-16 DIAGNOSIS — R05 Cough: Secondary | ICD-10-CM | POA: Diagnosis not present

## 2016-08-16 DIAGNOSIS — I10 Essential (primary) hypertension: Secondary | ICD-10-CM

## 2016-08-16 DIAGNOSIS — E785 Hyperlipidemia, unspecified: Secondary | ICD-10-CM

## 2016-08-16 DIAGNOSIS — E1151 Type 2 diabetes mellitus with diabetic peripheral angiopathy without gangrene: Secondary | ICD-10-CM | POA: Diagnosis not present

## 2016-08-16 DIAGNOSIS — E039 Hypothyroidism, unspecified: Secondary | ICD-10-CM | POA: Diagnosis not present

## 2016-08-16 DIAGNOSIS — R059 Cough, unspecified: Secondary | ICD-10-CM | POA: Insufficient documentation

## 2016-08-16 LAB — COMPREHENSIVE METABOLIC PANEL
ALK PHOS: 57 U/L (ref 39–117)
ALT: 9 U/L (ref 0–35)
AST: 9 U/L (ref 0–37)
Albumin: 3.8 g/dL (ref 3.5–5.2)
BILIRUBIN TOTAL: 0.5 mg/dL (ref 0.2–1.2)
BUN: 8 mg/dL (ref 6–23)
CALCIUM: 9.2 mg/dL (ref 8.4–10.5)
CO2: 28 mEq/L (ref 19–32)
Chloride: 102 mEq/L (ref 96–112)
Creatinine, Ser: 0.66 mg/dL (ref 0.40–1.20)
GFR: 95.32 mL/min (ref >60.00)
Glucose, Bld: 251 mg/dL — ABNORMAL HIGH (ref 70–99)
POTASSIUM: 4 meq/L (ref 3.5–5.1)
Sodium: 137 mEq/L (ref 135–145)
TOTAL PROTEIN: 6.6 g/dL (ref 6.0–8.3)

## 2016-08-16 LAB — LIPID PANEL
CHOLESTEROL: 144 mg/dL (ref 0–200)
HDL: 48.3 mg/dL (ref >39.00)
LDL Cholesterol: 73 mg/dL (ref 0–99)
NonHDL: 96.17
TRIGLYCERIDES: 115 mg/dL (ref 0.0–149.0)
Total CHOL/HDL Ratio: 3
VLDL: 23 mg/dL (ref 0.0–40.0)

## 2016-08-16 LAB — TSH: TSH: 3.24 u[IU]/mL (ref 0.35–4.50)

## 2016-08-16 LAB — HEMOGLOBIN A1C: Hgb A1c MFr Bld: 10 % — ABNORMAL HIGH (ref 4.6–6.5)

## 2016-08-16 MED ORDER — AZITHROMYCIN 250 MG PO TABS: ORAL_TABLET | ORAL | 0 refills | Status: DC

## 2016-08-16 MED ORDER — LOSARTAN POTASSIUM 50 MG PO TABS: 50.0000 mg | ORAL_TABLET | Freq: Every day | ORAL | 0 refills | Status: DC

## 2016-08-16 NOTE — Patient Instructions (Addendum)
Thank you for choosing [facility].  SUMMARY AND INSTRUCTIONS:  Thoughts and prayers for the loss of your family members.   Please continue to take your medications as prescribed.   Start the azithromycin.   Blood work today.  Continue to monitor your blood work at home.   Follow up pending blood work.    Medication:  Your prescription(s) have been submitted to your pharmacy or been printed and provided for you. Please take as directed and contact our office if you believe you are having problem(s) with the medication(s) or have any questions.  Labs:  Please stop by the lab on the lower level of the building for your blood work. Your results will be released to [place] (or called to you) after review, usually within 72 hours after test completion. If any changes need to be made, you will be notified at that same time.  1.) The lab is open from 7:30am to 5:30 pm Monday-Friday 2.) No appointment is necessary 3.) Fasting (if needed) is 6-8 hours after food and drink; black coffee and water are okay   Follow up:  If your symptoms worsen or fail to improve, please contact our office for further instruction, or in case of emergency go directly to the emergency room at the closest medical facility.   General Recommendations:    Please drink plenty of fluids.  Get plenty of rest   Sleep in humidified air  Use saline nasal sprays  Netti pot   OTC Medications:  Decongestants - helps relieve congestion   Flonase (generic fluticasone) or Nasacort (generic triamcinolone) - please make sure to use the "cross-over" technique at a 45 degree angle towards the opposite eye as opposed to straight up the nasal passageway.   Sudafed (generic pseudoephedrine - Note this is the one that is available behind the pharmacy counter); Products with phenylephrine (-PE) may also be used but is often not as effective as pseudoephedrine.   If you have HIGH BLOOD PRESSURE - Coricidin HBP;  AVOID any product that is -D as this contains pseudoephedrine which may increase your blood pressure.  Afrin (oxymetazoline) every 6-8 hours for up to 3 days.   Allergies - helps relieve runny nose, itchy eyes and sneezing   Claritin (generic loratidine), Allegra (fexofenidine), or Zyrtec (generic cyrterizine) for runny nose. These medications should not cause drowsiness.  Note - Benadryl (generic diphenhydramine) may be used however may cause drowsiness  Cough -   Delsym or Robitussin (generic dextromethorphan)  Expectorants - helps loosen mucus to ease removal   Mucinex (generic guaifenesin) as directed on the package.  Headaches / General Aches   Tylenol (generic acetaminophen) - DO NOT EXCEED 3 grams (3,000 mg) in a 24 hour time period  Advil/Motrin (generic ibuprofen)   Sore Throat -   Salt water gargle   Chloraseptic (generic benzocaine) spray or lozenges / Sucrets (generic dyclonine)

## 2016-08-16 NOTE — Progress Notes (Signed)
Subjective:    Patient ID: Alexis Grant, female    DOB: 08-Feb-1950, 66 y.o.   MRN: [id number]  Chief Complaint  Patient presents with  . Cough    states she had a cold about 5 weeks ago and has a lingering, blood sugars have been elevated, states last week she did not go below 200 all week    HPI:  Alexis Grant is a 66 y.o. female who  has a past medical history of Adenomatous polyp of colon (2008); Allergy; Anemia; Arthritis; Blood transfusion without reported diagnosis; Cataract; Depression; Diabetes mellitus; Dyslipidemia; Fibromyalgia; Gallstones; Gastritis; GERD (gastroesophageal reflux disease); Hemangioma; Hiatal hernia; Hyperlipidemia; Hypertension; Hypothyroidism (2010); IBS (irritable bowel syndrome); Migraines; Myocardial infarct ([place]) (2010); Obesity; OSA (obstructive sleep apnea); Pneumonia; Seizure disorder ([place]); Seizures ([place]); Sleep apnea; and Urinary tract infection. and presents today for an acute office visit.   1.) Cough - This is a new problem. Associated symptom of a cough and congestion that has been going on for about 5 weeks. Cough is productive. No fevers. Modifying factors include robitussion which does help a little. No sick contacts. Symptoms appear to be improving a little since initial onset. Cough is enough to effect her ability to sleep.   Allergies  Allergen Reactions  . Hydrocodone-Acetaminophen Itching    Itches all over; without rash per pt    2.) Diabetes - Currently maintained on Trulicity and metformin.Reports taking the medication as prescribed and denies adverse side effects or hypoglycemic readings. Blood sugars at home have remained elevated about 200-300 for about the 3rd week. Notes she has been sick for the past several days as well as under stress with the recent passing of multiple family member.  Denies new symptoms of end organ damage. Excessive thirst without excessive hunger or urination. Trying to eat well, however there are times  that she does not eat or skip meals.  Lab Results  Component Value Date   HGBA1C 10.0 (H) 08/16/2016     Lab Results  Component Value Date   CREATININE 0.66 08/16/2016   BUN 8 08/16/2016   NA 137 08/16/2016   K 4.0 08/16/2016   CL 102 08/16/2016   CO2 28 08/16/2016     Outpatient Medications Prior to Visit  Medication Sig Dispense Refill  . divalproex (DEPAKOTE) 500 MG DR tablet Take 1 tablet in AM, 2 tablets in PM 90 tablet 11  . Dulaglutide (TRULICITY) 4.43 XV/4.0GQ SOPN Inject 0.75 mg into the skin once a week. 4 pen 5  . DULoxetine (CYMBALTA) 30 MG capsule TAKE 1 CAPSULE BY MOUTH ONCE DAILY 90 capsule 1  . Eluxadoline (VIBERZI) 75 MG TABS Take 1 tablet by mouth 2 (two) times daily. KEEP APPT FOR FURTHER REFILLS 180 tablet 0  . Insulin Pen Needle (B-D UF III MINI PEN NEEDLES) 31G X 5 MM MISC USE TO INJECT INSULIN DAILY. DX E11.9 . 100 each 3  . levothyroxine (SYNTHROID, LEVOTHROID) 50 MCG tablet Take 1 tablet (50 mcg total) by mouth daily. 90 tablet 3  . metFORMIN (GLUCOPHAGE) 1000 MG tablet Take 1 tablet (1,000 mg total) by mouth daily with breakfast. 90 tablet 1  . simvastatin (ZOCOR) 20 MG tablet TAKE ONE TABLET BY MOUTH AT BEDTIME 90 tablet 0  . topiramate (TOPAMAX) 25 MG tablet Take 1 tablet at night for 1 week, then increase to 2 tablets at night 60 tablet 6  . traZODone (DESYREL) 50 MG tablet Take 1 tablet (50 mg total) by mouth at  bedtime as needed for sleep. 30 tablet 3  . losartan (COZAAR) 50 MG tablet Take 1 tablet (50 mg total) by mouth daily. 15 tablet 0   No facility-administered medications prior to visit.       Past Surgical History:  Procedure Laterality Date  . ABDOMINAL HYSTERECTOMY  1988  . CARDIAC CATHETERIZATION  2007   DrGamble ( now seeing Dr Tonita Cong cardiology)  . CHOLECYSTECTOMY  1985  . COLONOSCOPY    . COLONOSCOPY WITH PROPOFOL N/A 07/16/2014   Procedure: COLONOSCOPY WITH PROPOFOL;  Surgeon: Ladene Artist, MD;  Location: [facility];   Service: Endoscopy;  Laterality: N/A;  . POLYPECTOMY  2012   6 or 7 adenomatous, Dr.Stark  . steroid injection to left si joint  12/2008   Dr.Newton  . TOTAL KNEE ARTHROPLASTY Right 11/2005  . TOTAL KNEE ARTHROPLASTY Left   . TUBAL LIGATION  1986      Past Medical History:  Diagnosis Date  . Adenomatous polyp of colon 2008  . Allergy   . Anemia   . Arthritis   . Blood transfusion without reported diagnosis    following knee surgery   . Cataract    hx- removed both eyes  . Depression   . Diabetes mellitus   . Dyslipidemia   . Fibromyalgia   . Gallstones   . Gastritis   . GERD (gastroesophageal reflux disease)   . Hemangioma   . Hiatal hernia    Fibromyalgia  . Hyperlipidemia   . Hypertension   . Hypothyroidism 2010   Dr.Deveshawr  . IBS (irritable bowel syndrome)    Dr Fuller Plan  . Migraines   . Myocardial infarct ([place]) 2010   subendocardrial, following R TKR  . Obesity   . OSA (obstructive sleep apnea)   . Pneumonia   . Seizure disorder ([place])   . Seizures ([place])    last seizure was 2002 per pt 07-29-16  . Sleep apnea    no cpap currently  . Urinary tract infection       Review of Systems  Constitutional: Negative for chills, fatigue and fever.  HENT: Positive for congestion. Negative for ear pain, sinus pain, sinus pressure and sore throat.   Eyes:       Denies changes in vision  Respiratory: Positive for cough, shortness of breath and wheezing. Negative for chest tightness.   Cardiovascular: Negative for chest pain, palpitations and leg swelling.  Endocrine: Negative for polydipsia, polyphagia and polyuria.  Neurological: Positive for headaches. Negative for numbness.      Objective:    BP 128/80 (BP Location: Left Arm, Patient Position: Sitting, Cuff Size: Large)   Pulse 73   Temp 98.3 F (36.8 C) (Oral)   Resp 18   Ht 5\' 3"  (1.6 m)   Wt 291 lb 12.8 oz (132.4 kg)   SpO2 95%   BMI 51.69 kg/m  Nursing note and vital signs reviewed.  Physical  Exam  Constitutional: She is oriented to person, place, and time. She appears well-developed and well-nourished. No distress.  HENT:  Right Ear: Hearing, tympanic membrane, external ear and ear canal normal.  Left Ear: Hearing, tympanic membrane, external ear and ear canal normal.  Nose: Nose normal. Right sinus exhibits no maxillary sinus tenderness and no frontal sinus tenderness. Left sinus exhibits no maxillary sinus tenderness and no frontal sinus tenderness.  Mouth/Throat: Uvula is midline, oropharynx is clear and moist and mucous membranes are normal.  Cardiovascular: Normal rate, regular rhythm, normal heart sounds and  intact distal pulses.  Exam reveals no gallop and no friction rub.   No murmur heard. Pulmonary/Chest: Breath sounds normal. No respiratory distress. She has no wheezes. She has no rales. She exhibits no tenderness.  Neurological: She is alert and oriented to person, place, and time.  Skin: Skin is warm and dry.  Psychiatric: She has a normal mood and affect. Her behavior is normal. Judgment and thought content normal.       Assessment & Plan:   Problem List Items Addressed This Visit      Endocrine   DM (diabetes mellitus), type 2 with peripheral vascular complications ([place])    Type 2 diabetes appears labile with home blood sugar readings and current medication regimen without adverse side effects. Continue previously ordered A1c. Continue current dosage of metformin and Trulicity pending S9H results. Monitor blood sugars 1-2 times daily. Continue to monitor and follow up with PCP.       Relevant Medications   losartan (COZAAR) 50 MG tablet     Other   Cough - Primary    New-onset lingering cough that appears to be labile with no significant improvements over the past week. LIkely bronchitis. Start azithromycin. Continue over-the-counter medications as needed for symptom relief and supportive care. Follow-up if symptoms worsen or do not improve.          I  am having Ms. Wemhoff start on azithromycin. I am also having her maintain her metFORMIN, divalproex, Insulin Pen Needle, topiramate, levothyroxine, traZODone, Eluxadoline, DULoxetine, simvastatin, Dulaglutide, and losartan.   Meds ordered this encounter  Medications  . azithromycin (ZITHROMAX) 250 MG tablet    Sig: Take 2 tablets by mouth for 1 day and then 1 tablet by mouth daily for 4 days.    Dispense:  6 tablet    Refill:  0    Order Specific Question:   Supervising Provider    Answer:   Pricilla Holm A [[id number]]  . losartan (COZAAR) 50 MG tablet    Sig: Take 1 tablet (50 mg total) by mouth daily.    Dispense:  90 tablet    Refill:  0    Order Specific Question:   Supervising Provider    Answer:   Pricilla Holm A [[id number]]     Follow-up: Return in about 3 months (around 11/16/2016), or if symptoms worsen or fail to improve.  Mauricio Po, FNP

## 2016-08-16 NOTE — Assessment & Plan Note (Signed)
Type 2 diabetes appears labile with home blood sugar readings and current medication regimen without adverse side effects. Continue previously ordered A1c. Continue current dosage of metformin and Trulicity pending Y5O results. Monitor blood sugars 1-2 times daily. Continue to monitor and follow up with PCP.

## 2016-08-16 NOTE — Assessment & Plan Note (Signed)
New-onset lingering cough that appears to be labile with no significant improvements over the past week. LIkely bronchitis. Start azithromycin. Continue over-the-counter medications as needed for symptom relief and supportive care. Follow-up if symptoms worsen or do not improve.

## 2016-08-19 ENCOUNTER — Other Ambulatory Visit: Payer: Self-pay | Admitting: Emergency Medicine

## 2016-08-19 MED ORDER — DULAGLUTIDE 1.5 MG/0.5ML ~~LOC~~ SOAJ: 1.5000 mg | SUBCUTANEOUS | 5 refills | Status: DC

## 2016-09-01 ENCOUNTER — Ambulatory Visit (INDEPENDENT_AMBULATORY_CARE_PROVIDER_SITE_OTHER): Payer: Medicare Other | Admitting: Neurology

## 2016-09-01 ENCOUNTER — Encounter: Payer: Self-pay | Admitting: Neurology

## 2016-09-01 VITALS — BP 130/88 | HR 71 | Ht 63.0 in | Wt 290.1 lb

## 2016-09-01 DIAGNOSIS — R251 Tremor, unspecified: Secondary | ICD-10-CM

## 2016-09-01 DIAGNOSIS — G40309 Generalized idiopathic epilepsy and epileptic syndromes, not intractable, without status epilepticus: Secondary | ICD-10-CM

## 2016-09-01 DIAGNOSIS — G43109 Migraine with aura, not intractable, without status migrainosus: Secondary | ICD-10-CM | POA: Diagnosis not present

## 2016-09-01 DIAGNOSIS — H8110 Benign paroxysmal vertigo, unspecified ear: Secondary | ICD-10-CM | POA: Diagnosis not present

## 2016-09-01 MED ORDER — DIVALPROEX SODIUM 500 MG PO DR TAB: DELAYED_RELEASE_TABLET | ORAL | 11 refills | Status: DC

## 2016-09-01 MED ORDER — TOPIRAMATE 25 MG PO TABS: ORAL_TABLET | ORAL | 11 refills | Status: DC

## 2016-09-01 NOTE — Progress Notes (Signed)
NEUROLOGY FOLLOW UP OFFICE NOTE  Alexis Grant [id number]  HISTORY OF PRESENT ILLNESS: I had the pleasure of seeing Alexis Grant in follow-up in the neurology clinic on 09/01/2016.  The patient was last seen 4 months ago for seizures and headaches. She has not had any seizures since 2002. On her initial visit in November 2016, she reported frequent migraines. Increase in Depakote dose caused more tremors, no change in migraines. She was reporting migraines once a week. She was started on low dose Topamax 50mg  qhs, and reports she is feeling much better. Her hands feel more steady, she is more comfortable carrying things. The migraines are also further apart, around twice a month, and severity is not as bad. Last migraine was 2 weeks ago. She is reporting vertigo every time she extends her head back in the shower and when turning in bed. Vertigo is brief, no associated nausea/vomiting, no falls. She has chronic high-pitched tinnitus and has noticed decrease in hearing. She continues on Depakote 500mg  in AM, 1000mg  in PM with no side effects. She denies any diplopia, focal numbness/tingling/weakness, bowel/bladder dysfunction, no falls.  HPI 11/28/2014: [name] with a history of hypertension, hyperlipidemia, diabetes, obesity, sleep apnea, CAD s/p MI, with seizures and migraines. She reports seizures started at age 53, she woke up in the hospital with no prior warning symptoms. Since then, she has had 4 generalized tonic-clonic seizures, last was in 2002. She also report 5 or 6 "little episodes" where she feels dizzy, with blurred vision, then if she sits down quickly and tries to relax, she can avoid any progression. She would feel briefly confused. No associated olfactory/gustatory hallucinations, focal numbness/tingling/weakness, myoclonic jerks. The last time she had these episodes was at least 12 years ago. She recalls taking Neurontin initially, which did not help.  Topamax in the past which caused GI symptoms. She has been taking Depakote ER 500mg  in AM, 1000mg  in PM for many years now with no side effects. She reports having brain imaging and EEG in the past which were normal, no records available for review.   She reports migraines started at age 53. She has an aura of numbness in her face, cheeks, and temple, then she becomes very sensitive to lights, sounds, and smells. She sees flashing lights then her vision becomes blurred, followed by throbbing headaches in the frontal or occipital regions with associated nausea. No associated focal numbness/tingling/weakness in the extremities. She tried Imitrex with minimal effect. A course of Prednisone   She has chronic neck pain and has been dealing with diarrhea from IBS. She had an MI in 2010.   Epilepsy Risk Factors:  There is a strong family history of seizures in her paternal grandfather, paternal aunt, nephew. Otherwise she had a normal birth and early development.  There is no history of febrile convulsions, CNS infections such as meningitis/encephalitis, significant traumatic brain injury, neurosurgical procedures.  PAST MEDICAL HISTORY: Past Medical History:  Diagnosis Date  . Adenomatous polyp of colon 2008  . Allergy   . Anemia   . Arthritis   . Blood transfusion without reported diagnosis    following knee surgery   . Cataract    hx- removed both eyes  . Depression   . Diabetes mellitus   . Dyslipidemia   . Fibromyalgia   . Gallstones   . Gastritis   . GERD (gastroesophageal reflux disease)   . Hemangioma   . Hiatal hernia  Fibromyalgia  . Hyperlipidemia   . Hypertension   . Hypothyroidism 2010   Dr.Deveshawr  . IBS (irritable bowel syndrome)    Dr Fuller Plan  . Migraines   . Myocardial infarct ([place]) 2010   subendocardrial, following R TKR  . Obesity   . OSA (obstructive sleep apnea)   . Pneumonia   . Seizure disorder ([place])   . Seizures ([place])    last seizure was 2002 per pt  07-29-16  . Sleep apnea    no cpap currently  . Urinary tract infection     MEDICATIONS: Current Outpatient Prescriptions on File Prior to Visit  Medication Sig Dispense Refill  . azithromycin (ZITHROMAX) 250 MG tablet Take 2 tablets by mouth for 1 day and then 1 tablet by mouth daily for 4 days. 6 tablet 0  . divalproex (DEPAKOTE) 500 MG DR tablet Take 1 tablet in AM, 2 tablets in PM 90 tablet 11  . Dulaglutide (TRULICITY) 1.5 JM/4.2AS SOPN Inject 1.5 mg into the skin once a week. 4 pen 5  . DULoxetine (CYMBALTA) 30 MG capsule TAKE 1 CAPSULE BY MOUTH ONCE DAILY 90 capsule 1  . Eluxadoline (VIBERZI) 75 MG TABS Take 1 tablet by mouth 2 (two) times daily. KEEP APPT FOR FURTHER REFILLS 180 tablet 0  . Insulin Pen Needle (B-D UF III MINI PEN NEEDLES) 31G X 5 MM MISC USE TO INJECT INSULIN DAILY. DX E11.9 . 100 each 3  . levothyroxine (SYNTHROID, LEVOTHROID) 50 MCG tablet Take 1 tablet (50 mcg total) by mouth daily. 90 tablet 3  . losartan (COZAAR) 50 MG tablet Take 1 tablet (50 mg total) by mouth daily. 90 tablet 0  . metFORMIN (GLUCOPHAGE) 1000 MG tablet Take 1 tablet (1,000 mg total) by mouth daily with breakfast. 90 tablet 1  . simvastatin (ZOCOR) 20 MG tablet TAKE ONE TABLET BY MOUTH AT BEDTIME 90 tablet 0  . topiramate (TOPAMAX) 25 MG tablet Take 1 tablet at night for 1 week, then increase to 2 tablets at night 60 tablet 6  . traZODone (DESYREL) 50 MG tablet Take 1 tablet (50 mg total) by mouth at bedtime as needed for sleep. 30 tablet 3   No current facility-administered medications on file prior to visit.     ALLERGIES: Allergies  Allergen Reactions  . Hydrocodone-Acetaminophen Itching    Itches all over; without rash per pt    FAMILY HISTORY: Family History  Problem Relation Age of Onset  . Colon polyps Mother   . Ulcers Father   . Colon polyps Brother   . Diabetes Sister   . Diabetes Paternal Aunt   . Esophageal cancer Brother   . Kidney cancer Sister   . Colon cancer  Neg Hx   . Heart attack Neg Hx   . Stroke Neg Hx   . Cancer Neg Hx   . Gallbladder disease Neg Hx   . Rectal cancer Neg Hx   . Stomach cancer Neg Hx     SOCIAL HISTORY: Social History   Social History  . Marital status: Widowed    Spouse name: N/A  . Number of children: 3  . Years of education: college   Occupational History  . Retired    Social History Main Topics  . Smoking status: Never Smoker  . Smokeless tobacco: Never Used  . Alcohol use No  . Drug use: No  . Sexual activity: Not on file   Other Topics Concern  . Not on file   Social History Narrative  Illicit drug use- no   Patient does not get regular exercise due to knee.   Patient lives at home alone.    Patient son and wife lives next door.   Patient has 3 children.    Patient has some college.    Patient retired May 2014.     REVIEW OF SYSTEMS: Constitutional: No fevers, chills, or sweats, no generalized fatigue, change in appetite Eyes: No visual changes, double vision, eye pain Ear, nose and throat: No hearing loss, ear pain, nasal congestion, sore throat Cardiovascular: No chest pain, palpitations Respiratory:  No shortness of breath at rest or with exertion, wheezes GastrointestinaI: No nausea, vomiting, diarrhea, abdominal pain, fecal incontinence Genitourinary:  No dysuria, urinary retention or frequency Musculoskeletal:  + neck pain,no back pain Integumentary: No rash, pruritus, skin lesions Neurological: as above Psychiatric: No depression, insomnia, anxiety Endocrine: No palpitations, fatigue, diaphoresis, mood swings, change in appetite, change in weight, increased thirst Hematologic/Lymphatic:  No anemia, purpura, petechiae. Allergic/Immunologic: no itchy/runny eyes, nasal congestion, recent allergic reactions, rashes  PHYSICAL EXAM: Vitals:   09/01/16 1329  BP: 130/88  Pulse: 71  SpO2: 96%   General: No acute distress Head:  Normocephalic/atraumatic Neck: supple, no paraspinal  tenderness, full range of motion Heart:  Regular rate and rhythm Lungs:  Clear to auscultation bilaterally Back: No paraspinal tenderness Skin/Extremities: No rash, no edema Neurological Exam: alert and oriented to person, place, and time. No aphasia or dysarthria. Fund of knowledge is appropriate.  Recent and remote memory are intact.  Attention and concentration are normal.    Able to name objects and repeat phrases. Cranial nerves: Pupils equal, round, reactive to light.  Extraocular movements intact with no nystagmus. Visual fields full. Facial sensation intact. No facial asymmetry. Tongue, uvula, palate midline.  Motor: Bulk and tone normal, no cogwheeling,muscle strength 5/5 throughout with no pronator drift.  Sensation to light touch intact.  No extinction to double simultaneous stimulation.  Deep tendon reflexes 2+ throughout, toes downgoing.  Finger to nose testing intact.  Gait narrow-based and steady with good arm swing, no pill rolling tremor, able to tandem walk adequately.  Romberg negative. No resting tremor. There is still high frequency low amplitude endpoint > postural tremor bilaterally, but better than prior visit.   IMPRESSION: [name] with a history of hypertension, hyperlipidemia, obesity, sleep apnea on CPAP, CAD s/p MI, with a history of convulsive seizures, well-controlled on Depakote with no seizures since 2002. She has had migraines since her teenage years. Higher dose of Depakote caused tremors. Topamax was added for migraines and essential tremor, she has noticed an improvement and will increase dose to 75mg  qhs. She is aware of [place] driving laws to stop driving after a seizure until 6 months seizure-free. She is reporting positional vertigo and will be referred for Vestibular therapy. She will keep a calendar of her migraines and follow-up in 6 months. She knows to call for any changes.   Thank you for allowing me to participate in her care.  Please do  not hesitate to call for any questions or concerns.  The duration of this appointment visit was 25 minutes of face-to-face time with the patient.  Greater than 50% of this time was spent in counseling, explanation of diagnosis, planning of further management, and coordination of care.   Ellouise Newer, M.D.   CC: Dr. Quay Burow

## 2016-09-01 NOTE — Patient Instructions (Signed)
It was great seeing you! 1. Increase Topamax 25mg : Take 3 tablets every night 2. Continue Depakote 500mg  in AM, 1000mg  in PM 3. Refer to Vestibular therapy 4. Keep a calendar of your migraines, follow-up in 6 months, call for any changes

## 2016-09-08 ENCOUNTER — Encounter (HOSPITAL_COMMUNITY): Payer: Self-pay | Admitting: *Deleted

## 2016-09-14 ENCOUNTER — Ambulatory Visit (INDEPENDENT_AMBULATORY_CARE_PROVIDER_SITE_OTHER): Payer: Medicare Other

## 2016-09-14 ENCOUNTER — Encounter (INDEPENDENT_AMBULATORY_CARE_PROVIDER_SITE_OTHER): Payer: Self-pay | Admitting: Orthopaedic Surgery

## 2016-09-14 ENCOUNTER — Ambulatory Visit (INDEPENDENT_AMBULATORY_CARE_PROVIDER_SITE_OTHER): Payer: Medicare Other | Admitting: Orthopaedic Surgery

## 2016-09-14 VITALS — BP 123/100 | HR 87 | Resp 14 | Ht 63.0 in | Wt 290.0 lb

## 2016-09-14 DIAGNOSIS — M25512 Pain in left shoulder: Secondary | ICD-10-CM

## 2016-09-14 DIAGNOSIS — M79641 Pain in right hand: Secondary | ICD-10-CM | POA: Diagnosis not present

## 2016-09-14 DIAGNOSIS — G8929 Other chronic pain: Secondary | ICD-10-CM

## 2016-09-14 MED ORDER — METHYLPREDNISOLONE ACETATE 40 MG/ML IJ SUSP
80.0000 mg | INTRAMUSCULAR | Status: AC | PRN
  Administered 2016-09-14: 80 mg

## 2016-09-14 MED ORDER — BUPIVACAINE HCL 0.5 % IJ SOLN
2.0000 mL | INTRAMUSCULAR | Status: AC | PRN
Start: 2016-09-14 — End: 2016-09-14
  Administered 2016-09-14: 2 mL via INTRA_ARTICULAR

## 2016-09-14 MED ORDER — LIDOCAINE HCL 1 % IJ SOLN
2.0000 mL | INTRAMUSCULAR | Status: AC | PRN
  Administered 2016-09-14: 2 mL

## 2016-09-14 NOTE — Progress Notes (Signed)
Office Visit Note   Patient: Alexis Grant           Date of Birth: [birth year]           MRN: [id number] Visit Date: 09/14/2016              Requested by: Binnie Rail, MD [place], [place] [address] PCP: Binnie Rail, MD   Assessment & Plan: Visit Diagnoses:  1. Chronic left shoulder pain   2. Pain of right hand   Impingement syndrome left shoulder with painful arc of motion . Degenerative arthritis right thumb carpometacarpal joint. Cortisone injection subacromial region left shoulder and monitor her response. If no improvement would consider an MRI scan in the next 7-10 days. Freedom splint base of right thumb. Consider cortisone injection if no improvement  Follow-Up Instructions: No Follow-up on file.   Orders:  Orders Placed This Encounter  Procedures  . XR Shoulder Left   No orders of the defined types were placed in this encounter.     Procedures: Large Joint Inj Date/Time: 09/14/2016 11:03 AM Performed by: Garald Balding Authorized by: Garald Balding   Consent Given by:  Patient Timeout: prior to procedure the correct patient, procedure, and site was verified   Indications:  Pain Location:  Shoulder Site:  L subacromial bursa Prep: patient was prepped and draped in usual sterile fashion   Needle Size:  25 G Needle Length:  1.5 inches Approach:  Lateral Ultrasound Guidance: No   Fluoroscopic Guidance: No   Arthrogram: No   Medications:  80 mg methylPREDNISolone acetate 40 MG/ML; 2 mL lidocaine 1 %; 2 mL bupivacaine 0.5 % Aspiration Attempted: No   Patient tolerance:  Patient tolerated the procedure well with no immediate complications     Clinical Data: No additional findings.   Subjective: Chief Complaint  Patient presents with  . Left Shoulder - Pain    Pt is a 66 y o that presents with chronic Left shoulder pain.  Mrs. Rainone experienced insidious onset of left shoulder pain approximately 3 weeks ago. She denies any  injury or trauma. She's had considerable difficulty raising her arm overhead and sleeping on that side. She denies any neck pain. She's not experience any numbness or tingling. No skin changes. Denies fever chills shortness of breath or chest pain. It has progressively "worse" to the point where it doesn't interfere with her activities of daily living. She is retired. She is left hand nondominant. She also is been experiencing some chronic pain at the base of her right thumb without numbness or tingling. She also denies injury or trauma to her thumb  HPI  Review of Systems  Constitutional: Negative for chills, fatigue and fever.  Eyes: Negative for itching.  Respiratory: Negative for chest tightness and shortness of breath.   Cardiovascular: Negative for chest pain, palpitations and leg swelling.  Gastrointestinal: Negative for blood in stool, constipation and diarrhea.  Musculoskeletal: Negative for back pain, joint swelling, neck pain and neck stiffness.  Neurological: Negative for dizziness, weakness, numbness and headaches.  Hematological: Does not bruise/bleed easily.  Psychiatric/Behavioral: Negative for sleep disturbance. The patient is not nervous/anxious.      Objective: Vital Signs: BP (!) 123/100   Pulse 87   Resp 14   Ht 5\' 3"  (1.6 m)   Wt 290 lb (131.5 kg)   BMI 51.37 kg/m   Physical Exam  Ortho Exam left shoulder with considerable pain on impingement testing. Positive  empty can testing. Skin intact. Biceps appears to be intact. Good grip and good release distally. Normal neurovascular exam. Limited overhead motion related to pain is able to touch the middle of her back with her hand with some discomfort. No popping or clicking clicking. Does have some local tenderness in the anterior and lateral subacromial region. Good strength. Right hand with pain at the base of the right thumb. Positive grind testing. Skin intact. Neurovascular exam intact. Negative Tinel's and  Phalen's at the wrist. No pain with range of motion of the cervical spine or referred pain to either upper extremity Awake alert and oriented 3. Walks without a limp. No shortness of breath  Specialty Comments:  No specialty comments available.  Imaging: Xr Shoulder Left  Result Date: 09/14/2016 Films of the left shoulder obtained in several projections. The humeral head appears to be centered about the glenoid. There are some cysts in the glenoid and no evidence of any joint space narrowing. No ectopic calcification. Minimal degenerative changes  at the acromioclavicular joint. I measured 7.6 mm between the acromium and the humeral head.Appears to have some lateral downsloping of the acromion.    PMFS History: Patient Active Problem List   Diagnosis Date Noted  . Cough 08/16/2016  . Difficulty sleeping 05/24/2016  . Rosacea 11/25/2015  . Poor balance 11/25/2015  . Tremor 04/18/2015  . Other fatigue 02/17/2015  . Migraine with aura and without status migrainosus, not intractable 12/04/2014  . 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 Hx of adenomatous colonic polyps 07/16/2014  . Benign neoplasm of ascending colon 07/16/2014  . Benign neoplasm of descending colon 07/16/2014  . Benign neoplasm of cecum 07/16/2014  . Benign neoplasm of transverse colon 07/16/2014  . Fibromyalgia 05/30/2013  . Generalized convulsive epilepsy ([place]) 10/20/2012  . Seasonal allergies 12/08/2011  . DM (diabetes mellitus), type 2 with peripheral vascular complications ([place]) 08/67/6195  . Essential hypertension 01/05/2010  . Irritable bowel syndrome 01/05/2010  . VERTIGO 09/16/2009  . VITAMIN D DEFICIENCY 02/24/2009  . MYOCARDIAL INFARCTION, ACUTE, SUBENDOCARDIAL 10/04/2008  . Hypothyroidism 03/19/2008  . COLONIC POLYPS, ADENOMATOUS, HX OF 08/07/2007  . HEMANGIOMA  04/26/2007  . Dyslipidemia 04/26/2007  . OBESITY 04/26/2007  . Depression 04/26/2007  . SLEEP APNEA, OBSTRUCTIVE 04/26/2007  . HIATAL HERNIA 04/26/2007  . FATTY LIVER DISEASE 04/26/2007  . ARTHRITIS 04/26/2007   Past Medical History:  Diagnosis Date  . Adenomatous polyp of colon 2008  . Allergy   . Anemia   . Anxiety   . Arthritis   . Blood transfusion without reported diagnosis    following knee surgery   . Cataract    hx- removed both eyes  . Depression   . Diabetes mellitus    type II   . Dyslipidemia   . Fibromyalgia   . Gallstones   . Gastritis   . GERD (gastroesophageal reflux disease)   . Hemangioma   . Hiatal hernia    Fibromyalgia  . Hyperlipidemia   . Hypertension   . Hypothyroidism 2010   Dr.Deveshawr  . IBS (irritable bowel syndrome)    Dr Fuller Plan  . Migraines   . Myocardial infarct ([place]) 2010   subendocardrial, following R TKR  . Obesity   . OSA (obstructive sleep apnea)    cpap - does not know settings   . Pneumonia   . Seizure disorder ([place])   . Seizures ([place])  last seizure was 2002 per pt 07-29-16  . Sleep apnea   . Urinary tract infection     Family History  Problem Relation Age of Onset  . Colon polyps Mother   . Ulcers Father   . Colon polyps Brother   . Diabetes Sister   . Diabetes Paternal Aunt   . Esophageal cancer Brother   . Kidney cancer Sister   . Colon cancer Neg Hx   . Heart attack Neg Hx   . Stroke Neg Hx   . Cancer Neg Hx   . Gallbladder disease Neg Hx   . Rectal cancer Neg Hx   . Stomach cancer Neg Hx     Past Surgical History:  Procedure Laterality Date  . ABDOMINAL HYSTERECTOMY  1988  . CARDIAC CATHETERIZATION  2007   DrGamble ( now seeing Dr Tonita Cong cardiology)  . CHOLECYSTECTOMY  1985  . COLONOSCOPY    . COLONOSCOPY WITH PROPOFOL N/A 07/16/2014   Procedure: COLONOSCOPY WITH PROPOFOL;  Surgeon: Ladene Artist, MD;  Location: [facility];  Service: Endoscopy;  Laterality: N/A;  . POLYPECTOMY  2012   6 or  7 adenomatous, Dr.Stark  . steroid injection to left si joint  12/2008   Dr.Newton  . TOTAL KNEE ARTHROPLASTY Right 11/2005  . TOTAL KNEE ARTHROPLASTY Left   . TUBAL LIGATION  1986   Social History   Occupational History  . Retired    Social History Main Topics  . Smoking status: Never Smoker  . Smokeless tobacco: Never Used  . Alcohol use No  . Drug use: No  . Sexual activity: Not on file

## 2016-09-21 ENCOUNTER — Encounter (HOSPITAL_COMMUNITY): Payer: Self-pay

## 2016-09-21 ENCOUNTER — Ambulatory Visit (HOSPITAL_COMMUNITY): Payer: Medicare Other | Admitting: Anesthesiology

## 2016-09-21 ENCOUNTER — Ambulatory Visit (HOSPITAL_COMMUNITY)
Admission: RE | Admit: 2016-09-21 | Discharge: 2016-09-21 | Disposition: A | Discharge status: Home / Self Care | Payer: Medicare Other | Source: Ambulatory Visit | Attending: Gastroenterology | Admitting: Gastroenterology

## 2016-09-21 ENCOUNTER — Encounter (HOSPITAL_COMMUNITY)
Admission: RE | Disposition: A | Discharge status: Home / Self Care | Payer: Self-pay | Source: Ambulatory Visit | Attending: Gastroenterology

## 2016-09-21 DIAGNOSIS — K589 Irritable bowel syndrome without diarrhea: Secondary | ICD-10-CM | POA: Insufficient documentation

## 2016-09-21 DIAGNOSIS — I10 Essential (primary) hypertension: Secondary | ICD-10-CM | POA: Insufficient documentation

## 2016-09-21 DIAGNOSIS — Z79899 Other long term (current) drug therapy: Secondary | ICD-10-CM | POA: Insufficient documentation

## 2016-09-21 DIAGNOSIS — D122 Benign neoplasm of ascending colon: Secondary | ICD-10-CM | POA: Diagnosis not present

## 2016-09-21 DIAGNOSIS — G4733 Obstructive sleep apnea (adult) (pediatric): Secondary | ICD-10-CM | POA: Diagnosis not present

## 2016-09-21 DIAGNOSIS — M1990 Unspecified osteoarthritis, unspecified site: Secondary | ICD-10-CM | POA: Diagnosis not present

## 2016-09-21 DIAGNOSIS — I252 Old myocardial infarction: Secondary | ICD-10-CM | POA: Insufficient documentation

## 2016-09-21 DIAGNOSIS — E785 Hyperlipidemia, unspecified: Secondary | ICD-10-CM | POA: Insufficient documentation

## 2016-09-21 DIAGNOSIS — Z885 Allergy status to narcotic agent status: Secondary | ICD-10-CM | POA: Insufficient documentation

## 2016-09-21 DIAGNOSIS — F329 Major depressive disorder, single episode, unspecified: Secondary | ICD-10-CM | POA: Insufficient documentation

## 2016-09-21 DIAGNOSIS — G40909 Epilepsy, unspecified, not intractable, without status epilepticus: Secondary | ICD-10-CM | POA: Insufficient documentation

## 2016-09-21 DIAGNOSIS — Z794 Long term (current) use of insulin: Secondary | ICD-10-CM | POA: Insufficient documentation

## 2016-09-21 DIAGNOSIS — Z86010 Personal history of colonic polyps: Secondary | ICD-10-CM | POA: Insufficient documentation

## 2016-09-21 DIAGNOSIS — E039 Hypothyroidism, unspecified: Secondary | ICD-10-CM | POA: Diagnosis not present

## 2016-09-21 DIAGNOSIS — M797 Fibromyalgia: Secondary | ICD-10-CM | POA: Diagnosis not present

## 2016-09-21 DIAGNOSIS — D124 Benign neoplasm of descending colon: Secondary | ICD-10-CM | POA: Insufficient documentation

## 2016-09-21 DIAGNOSIS — Z1211 Encounter for screening for malignant neoplasm of colon: Secondary | ICD-10-CM | POA: Diagnosis not present

## 2016-09-21 DIAGNOSIS — K219 Gastro-esophageal reflux disease without esophagitis: Secondary | ICD-10-CM | POA: Insufficient documentation

## 2016-09-21 DIAGNOSIS — Z6841 Body Mass Index (BMI) 40.0 and over, adult: Secondary | ICD-10-CM | POA: Diagnosis not present

## 2016-09-21 DIAGNOSIS — D123 Benign neoplasm of transverse colon: Secondary | ICD-10-CM | POA: Diagnosis not present

## 2016-09-21 DIAGNOSIS — E119 Type 2 diabetes mellitus without complications: Secondary | ICD-10-CM | POA: Insufficient documentation

## 2016-09-21 DIAGNOSIS — K640 First degree hemorrhoids: Secondary | ICD-10-CM | POA: Diagnosis not present

## 2016-09-21 DIAGNOSIS — Z7984 Long term (current) use of oral hypoglycemic drugs: Secondary | ICD-10-CM | POA: Insufficient documentation

## 2016-09-21 DIAGNOSIS — K649 Unspecified hemorrhoids: Secondary | ICD-10-CM | POA: Diagnosis not present

## 2016-09-21 HISTORY — PX: COLONOSCOPY WITH PROPOFOL: SHX5780

## 2016-09-21 HISTORY — DX: Anxiety disorder, unspecified: F41.9

## 2016-09-21 LAB — GLUCOSE, CAPILLARY: Glucose-Capillary: 186 mg/dL — ABNORMAL HIGH (ref 65–99)

## 2016-09-21 SURGERY — COLONOSCOPY WITH PROPOFOL
Anesthesia: Monitor Anesthesia Care

## 2016-09-21 MED ORDER — LACTATED RINGERS IV SOLN
INTRAVENOUS | Status: DC
  Administered 2016-09-21: 09:00:00 via INTRAVENOUS
  Administered 2016-09-21: 1000 mL via INTRAVENOUS

## 2016-09-21 MED ORDER — SODIUM CHLORIDE 0.9 % IV SOLN: INTRAVENOUS | Status: DC

## 2016-09-21 MED ORDER — PROPOFOL 10 MG/ML IV BOLUS
INTRAVENOUS | Status: AC
  Filled 2016-09-21: qty 40

## 2016-09-21 MED ORDER — LIDOCAINE 2% (20 MG/ML) 5 ML SYRINGE
INTRAMUSCULAR | Status: DC | PRN
  Administered 2016-09-21: 60 mg via INTRAVENOUS

## 2016-09-21 MED ORDER — PROPOFOL 10 MG/ML IV BOLUS
INTRAVENOUS | Status: DC | PRN
  Administered 2016-09-21 (×10): 40 mg via INTRAVENOUS

## 2016-09-21 SURGICAL SUPPLY — 22 items

## 2016-09-21 NOTE — Transfer of Care (Signed)
Immediate Anesthesia Transfer of Care Note  Patient: Alexis Grant  Procedure(s) Performed: Procedure(s): COLONOSCOPY WITH PROPOFOL (N/A)  Patient Location: PACU and Endoscopy Unit  Anesthesia Type:MAC  Level of Consciousness: awake and alert   Airway & Oxygen Therapy: Patient Spontanous Breathing and Patient connected to face mask oxygen  Post-op Assessment: Report given to RN and Post -op Vital signs reviewed and stable  Post vital signs: Reviewed and stable  Last Vitals:  Vitals:   09/21/16 0836  BP: (!) 145/72  Pulse: 92  Resp: 14  Temp: 36.8 C  SpO2: 96%    Last Pain:  Vitals:   09/21/16 0836  TempSrc: Oral         Complications: No apparent anesthesia complications

## 2016-09-21 NOTE — Anesthesia Postprocedure Evaluation (Signed)
Anesthesia Post Note  Patient: Alexis Grant  Procedure(s) Performed: Procedure(s) (LRB): COLONOSCOPY WITH PROPOFOL (N/A)     Patient location during evaluation: PACU Anesthesia Type: MAC Level of consciousness: awake and alert Pain management: pain level controlled Vital Signs Assessment: post-procedure vital signs reviewed and stable Respiratory status: spontaneous breathing, nonlabored ventilation, respiratory function stable and patient connected to nasal cannula oxygen Cardiovascular status: stable and blood pressure returned to baseline Anesthetic complications: no    Last Vitals:  Vitals:   09/21/16 0953 09/21/16 0955  BP: 123/66   Pulse: 90 82  Resp: 12 (!) 22  Temp: 36.5 C   SpO2: 100% 100%    Last Pain:  Vitals:   09/21/16 0953  TempSrc: Oral                 Taiten Brawn S

## 2016-09-21 NOTE — Anesthesia Preprocedure Evaluation (Addendum)
Anesthesia Evaluation  Patient identified by MRN, date of birth, ID band Patient awake    Reviewed: Allergy & Precautions, NPO status , Patient's Chart, lab work & pertinent test results  Airway Mallampati: II  TM Distance: >3 FB Neck ROM: Full    Dental no notable dental hx. (+) Dental Advisory Given   Pulmonary sleep apnea and Continuous Positive Airway Pressure Ventilation ,    Pulmonary exam normal breath sounds clear to auscultation       Cardiovascular hypertension, + Past MI  Normal cardiovascular exam Rhythm:Regular Rate:Normal     Neuro/Psych Seizures -, Well Controlled,  negative psych ROS   GI/Hepatic Neg liver ROS, GERD  ,  Endo/Other  diabetesHypothyroidism Morbid obesity  Renal/GU negative Renal ROS  negative genitourinary   Musculoskeletal negative musculoskeletal ROS (+)   Abdominal   Peds negative pediatric ROS (+)  Hematology negative hematology ROS (+)   Anesthesia Other Findings   Reproductive/Obstetrics negative OB ROS                            Anesthesia Physical Anesthesia Plan  ASA: III  Anesthesia Plan: MAC   Post-op Pain Management:    Induction: Intravenous  PONV Risk Score and Plan: 0  Airway Management Planned: Simple Face Mask  Additional Equipment:   Intra-op Plan:   Post-operative Plan:   Informed Consent: I have reviewed the patients History and Physical, chart, labs and discussed the procedure including the risks, benefits and alternatives for the proposed anesthesia with the patient or authorized representative who has indicated his/her understanding and acceptance.   Dental advisory given  Plan Discussed with: CRNA and Surgeon  Anesthesia Plan Comments:        Anesthesia Quick Evaluation

## 2016-09-21 NOTE — Op Note (Signed)
[facility] Patient Name: Alexis Grant Procedure Date: 09/21/2016 MRN: [id number] Attending MD: Ladene Artist , MD Date of Birth: 02-19-1950 CSN: [id number] Age: 66 Admit Type: Outpatient Procedure:                Colonoscopy Indications:              Surveillance: History of numerous (> 10) adenomas                            on last colonoscopy (< 3 yrs) Providers:                Pricilla Riffle. Fuller Plan, MD, Cleda Daub, RN, Alan Mulder, Technician Referring MD:             Binnie Rail, MD Medicines:                Monitored Anesthesia Care Complications:            No immediate complications. Estimated blood loss:                            None. Estimated Blood Loss:     Estimated blood loss: none. Procedure:                Pre-Anesthesia Assessment:                           - Prior to the procedure, a History and Physical                            was performed, and patient medications and                            allergies were reviewed. The patient's tolerance of                            previous anesthesia was also reviewed. The risks                            and benefits of the procedure and the sedation                            options and risks were discussed with the patient.                            All questions were answered, and informed consent                            was obtained. Prior Anticoagulants: The patient has                            taken no previous anticoagulant or antiplatelet  agents. ASA Grade Assessment: III - A patient with                            severe systemic disease. After reviewing the risks                            and benefits, the patient was deemed in                            satisfactory condition to undergo the procedure.                           After obtaining informed consent, the colonoscope                            was passed under  direct vision. Throughout the                            procedure, the patient's blood pressure, pulse, and                            oxygen saturations were monitored continuously. The                            EC-[id number] ([id number]) scope was introduced through                            the anus and advanced to the the cecum, identified                            by appendiceal orifice and ileocecal valve. The                            ileocecal valve, appendiceal orifice, and rectum                            were photographed. The quality of the bowel                            preparation was excellent. The colonoscopy was                            performed without difficulty. The patient tolerated                            the procedure well. The quality of the bowel                            preparation was excellent. Scope In: 9:28:57 AM Scope Out: 9:44:15 AM Scope Withdrawal Time: 0 hours 12 minutes 7 seconds  Total Procedure Duration: 0 hours 15 minutes 18 seconds  Findings:      The perianal and digital rectal examinations were normal.      Two sessile polyps were found in the descending  colon and transverse       colon. The polyps were 5 to 7 mm in size. These polyps were removed with       a cold snare. Resection and retrieval were complete.      Internal hemorrhoids were found during retroflexion. The hemorrhoids       were small and Grade I (internal hemorrhoids that do not prolapse).      The exam was otherwise without abnormality on direct and retroflexion       views. Impression:               - Two 5 to 7 mm polyps in the descending colon and                            in the transverse colon, removed with a cold snare.                            Resected and retrieved.                           - Internal hemorrhoids.                           - The examination was otherwise normal on direct                            and retroflexion views. Moderate  Sedation:      N/A- Per Anesthesia Care Recommendation:           - Repeat colonoscopy in 3 years for surveillance.                           - Patient has a contact number available for                            emergencies. The signs and symptoms of potential                            delayed complications were discussed with the                            patient. Return to normal activities tomorrow.                            Written discharge instructions were provided to the                            patient.                           - Resume previous diet.                           - Continue present medications.                           - Await pathology results. Procedure Code(s):        ---  Professional ---                           [phone], Colonoscopy, flexible; with removal of                            tumor(s), polyp(s), or other lesion(s) by snare                            technique Diagnosis Code(s):        --- Professional ---                           Z86.010, Personal history of colonic polyps                           D12.4, Benign neoplasm of descending colon                           D12.3, Benign neoplasm of transverse colon (hepatic                            flexure or splenic flexure)                           K64.0, First degree hemorrhoids CPT copyright 2016 American Medical Association. All rights reserved. The codes documented in this report are preliminary and upon coder review may  be revised to meet current compliance requirements. Ladene Artist, MD 09/21/2016 9:49:46 AM This report has been signed electronically. Number of Addenda: 0

## 2016-09-21 NOTE — H&P (Signed)
HPI:  Alexis Grant is a 66 y.o. female who  has a past medical history of Adenomatous polyp of colon (2008); Allergy; Anemia; Arthritis; Blood transfusion without reported diagnosis; Cataract; Depression; Diabetes mellitus; Dyslipidemia; Fibromyalgia; Gallstones; Gastritis; GERD (gastroesophageal reflux disease); Hemangioma; Hiatal hernia; Hyperlipidemia; Hypertension; Hypothyroidism (2010); IBS (irritable bowel syndrome); Migraines; Myocardial infarct ([place]) (2010); Obesity; OSA (obstructive sleep apnea); Pneumonia; Seizure disorder ([place]); Seizures ([place]); Sleep apnea; and Urinary tract infection. She is due for surveillance colonoscopy for a history of multiple adenomatous colon polyps. Last colonoscopy in 06/2014.          Allergies  Allergen Reactions  . Hydrocodone-Acetaminophen Itching    Itches all over; without rash per pt      Recent Labs       Lab Results  Component Value Date   HGBA1C 10.0 (H) 08/16/2016       Recent Labs       Lab Results  Component Value Date   CREATININE 0.66 08/16/2016   BUN 8 08/16/2016   NA 137 08/16/2016   K 4.0 08/16/2016   CL 102 08/16/2016   CO2 28 08/16/2016             Outpatient Medications Prior to Visit  Medication Sig Dispense Refill  . divalproex (DEPAKOTE) 500 MG DR tablet Take 1 tablet in AM, 2 tablets in PM 90 tablet 11  . Dulaglutide (TRULICITY) 2.02 RK/2.7CW SOPN Inject 0.75 mg into the skin once a week. 4 pen 5  . DULoxetine (CYMBALTA) 30 MG capsule TAKE 1 CAPSULE BY MOUTH ONCE DAILY 90 capsule 1  . Eluxadoline (VIBERZI) 75 MG TABS Take 1 tablet by mouth 2 (two) times daily. KEEP APPT FOR FURTHER REFILLS 180 tablet 0  . Insulin Pen Needle (B-D UF III MINI PEN NEEDLES) 31G X 5 MM MISC USE TO INJECT INSULIN DAILY. DX E11.9 . 100 each 3  . levothyroxine (SYNTHROID, LEVOTHROID) 50 MCG tablet Take 1 tablet (50 mcg total) by mouth daily. 90 tablet 3  . metFORMIN (GLUCOPHAGE) 1000 MG tablet Take 1  tablet (1,000 mg total) by mouth daily with breakfast. 90 tablet 1  . simvastatin (ZOCOR) 20 MG tablet TAKE ONE TABLET BY MOUTH AT BEDTIME 90 tablet 0  . topiramate (TOPAMAX) 25 MG tablet Take 1 tablet at night for 1 week, then increase to 2 tablets at night 60 tablet 6  . traZODone (DESYREL) 50 MG tablet Take 1 tablet (50 mg total) by mouth at bedtime as needed for sleep. 30 tablet 3  . losartan (COZAAR) 50 MG tablet Take 1 tablet (50 mg total) by mouth daily. 15 tablet 0   No facility-administered medications prior to visit.            Past Surgical History:  Procedure Laterality Date  . ABDOMINAL HYSTERECTOMY  1988  . CARDIAC CATHETERIZATION  2007   DrGamble ( now seeing Dr Tonita Cong cardiology)  . CHOLECYSTECTOMY  1985  . COLONOSCOPY    . COLONOSCOPY WITH PROPOFOL N/A 07/16/2014   Procedure: COLONOSCOPY WITH PROPOFOL;  Surgeon: Ladene Artist, MD;  Location: [facility];  Service: Endoscopy;  Laterality: N/A;  . POLYPECTOMY  2012   6 or 7 adenomatous, Dr.Lindley Hiney  . steroid injection to left si joint  12/2008   Dr.Newton  . TOTAL KNEE ARTHROPLASTY Right 11/2005  . TOTAL KNEE ARTHROPLASTY Left   . TUBAL LIGATION  1986          Past Medical History:  Diagnosis Date  .  Adenomatous polyp of colon 2008  . Allergy   . Anemia   . Arthritis   . Blood transfusion without reported diagnosis    following knee surgery   . Cataract    hx- removed both eyes  . Depression   . Diabetes mellitus   . Dyslipidemia   . Fibromyalgia   . Gallstones   . Gastritis   . GERD (gastroesophageal reflux disease)   . Hemangioma   . Hiatal hernia    Fibromyalgia  . Hyperlipidemia   . Hypertension   . Hypothyroidism 2010   Dr.Deveshawr  . IBS (irritable bowel syndrome)    Dr Fuller Plan  . Migraines   . Myocardial infarct ([place]) 2010   subendocardrial, following R TKR  . Obesity   . OSA (obstructive sleep apnea)   . Pneumonia   . Seizure  disorder ([place])   . Seizures ([place])    last seizure was 2002 per pt 07-29-16  . Sleep apnea    no cpap currently  . Urinary tract infection       Review of Systems  Constitutional: Negative for chills, fatigue and fever.  HENT: Positive for congestion. Negative for ear pain, sinus pain, sinus pressure and sore throat.   Eyes:       Denies changes in vision  Respiratory: Positive for cough, shortness of breath and wheezing. Negative for chest tightness.   Cardiovascular: Negative for chest pain, palpitations and leg swelling.  Endocrine: Negative for polydipsia, polyphagia and polyuria.  Neurological: Positive for headaches. Negative for numbness.      Objective:  BP 128/80 (BP Location: Left Arm, Patient Position: Sitting, Cuff Size: Large)   Pulse 73   Temp 98.3 F (36.8 C) (Oral)   Resp 18   Ht 5\' 3"  (1.6 m)   Wt 291 lb 12.8 oz (132.4 kg)   SpO2 95%   BMI 51.69 kg/m  Nursing note and vital signs reviewed.  Physical Exam  Constitutional: She is oriented to person, place, and time. She appears well-developed and well-nourished. No distress.  HENT:  Right Ear: Hearing, tympanic membrane, external ear and ear canal normal.  Left Ear: Hearing, tympanic membrane, external ear and ear canal normal.  Nose: Nose normal. Right sinus exhibits no maxillary sinus tenderness and no frontal sinus tenderness. Left sinus exhibits no maxillary sinus tenderness and no frontal sinus tenderness.  Mouth/Throat: Uvula is midline, oropharynx is clear and moist and mucous membranes are normal.  Cardiovascular: Normal rate, regular rhythm, normal heart sounds and intact distal pulses.  Exam reveals no gallop and no friction rub.   No murmur heard. Pulmonary/Chest: Breath sounds normal. No respiratory distress. She has no wheezes. She has no rales. She exhibits no tenderness.  Neurological: She is alert and oriented to person, place, and time.  Skin: Skin is warm and dry.  Psychiatric:  She has a normal mood and affect. Her behavior is normal. Judgment and thought content normal.       Assessment & Plan:   1. Personal history of multiple adenomatous colon polyps due for surveillance colonoscopy. Schedule colonoscopy. The risks (including bleeding, perforation, infection, missed lesions, medication reactions and possible hospitalization or surgery if complications occur), benefits, and alternatives to colonoscopy with possible biopsy and possible polypectomy were discussed with the patient and they consent to proceed.

## 2016-09-21 NOTE — Discharge Instructions (Signed)
YOU HAD AN ENDOSCOPIC PROCEDURE TODAY: Refer to the procedure report and other information in the discharge instructions given to you for any specific questions about what was found during the examination. If this information does not answer your questions, please call [place] office at [phone] to clarify.  ° °YOU SHOULD EXPECT: Some feelings of bloating in the abdomen. Passage of more gas than usual. Walking can help get rid of the air that was put into your GI tract during the procedure and reduce the bloating. If you had a lower endoscopy (such as a colonoscopy or flexible sigmoidoscopy) you may notice spotting of blood in your stool or on the toilet paper. Some abdominal soreness may be present for a day or two, also. ° °DIET: Your first meal following the procedure should be a light meal and then it is ok to progress to your normal diet. A half-sandwich or bowl of soup is an example of a good first meal. Heavy or fried foods are harder to digest and may make you feel nauseous or bloated. Drink plenty of fluids but you should avoid alcoholic beverages for 24 hours. If you had a esophageal dilation, please see attached instructions for diet.   ° °ACTIVITY: Your care partner should take you home directly after the procedure. You should plan to take it easy, moving slowly for the rest of the day. You can resume normal activity the day after the procedure however YOU SHOULD NOT DRIVE, use power tools, machinery or perform tasks that involve climbing or major physical exertion for 24 hours (because of the sedation medicines used during the test).  ° °SYMPTOMS TO REPORT IMMEDIATELY: °A gastroenterologist can be reached at any hour. Please call [phone]  for any of the following symptoms:  °Following lower endoscopy (colonoscopy, flexible sigmoidoscopy) °Excessive amounts of blood in the stool  °Significant tenderness, worsening of abdominal pains  °Swelling of the abdomen that is new, acute  °Fever of 100° or  higher  °Following upper endoscopy (EGD, EUS, ERCP, esophageal dilation) °Vomiting of blood or coffee ground material  °New, significant abdominal pain  °New, significant chest pain or pain under the shoulder blades  °Painful or persistently difficult swallowing  °New shortness of breath  °Black, tarry-looking or red, bloody stools ° °FOLLOW UP:  °If any biopsies were taken you will be contacted by phone or by letter within the next 1-3 weeks. Call [phone]  if you have not heard about the biopsies in 3 weeks.  °Please also call with any specific questions about appointments or follow up tests. ° °

## 2016-09-21 NOTE — Anesthesia Procedure Notes (Signed)
Date/Time: 09/21/2016 9:22 AM Performed by: Cynda Familia Oxygen Delivery Method: Simple face mask Placement Confirmation: positive ETCO2 and breath sounds checked- equal and bilateral Dental Injury: Teeth and Oropharynx as per pre-operative assessment  Comments: Mask for sedation

## 2016-09-23 ENCOUNTER — Encounter (HOSPITAL_COMMUNITY): Payer: Self-pay | Admitting: Gastroenterology

## 2016-09-24 ENCOUNTER — Other Ambulatory Visit: Payer: Self-pay

## 2016-09-24 ENCOUNTER — Telehealth: Payer: Self-pay | Admitting: Neurology

## 2016-09-24 MED ORDER — PREDNISONE 20 MG PO TABS: 20.0000 mg | ORAL_TABLET | Freq: Every day | ORAL | 0 refills | Status: DC

## 2016-09-24 NOTE — Telephone Encounter (Signed)
Patient has a migraine and wants to talk to someone please call

## 2016-09-24 NOTE — Telephone Encounter (Signed)
Pt states that she has had a HA for 3-4 days. She feels that since her Topamax was increased at her last visit she has a HA daily.  States that they are so intense. She has facial numbness and nausea as well.  Pt complains that her thoughts are confused. That she cannot say what she wants to say. States that her family has noticed a change too.  Pt also take Depakote 500mg  1 in am and 2 at bedtime.

## 2016-09-24 NOTE — Telephone Encounter (Signed)
Pt informed to start taking 50mg  of Topamax. She does want a rx for the Prednisone. Prednisone sent to [facility] on [address] as instructed.

## 2016-09-24 NOTE — Telephone Encounter (Signed)
Pls have her go back to 50mg  qhs of Topamax. Does she want to do a 1-week course of Prednisone to hopefully break the headache cycle? If yes, pls send Rx for Prednisone 20mg : Take 3 tabs on day 1, 2-1/2 tabs on day 2, 2 tabs on day 3, 1-1/2 tabs on day 4, 1 tab on day 5, 1/2 tab on days 6 and 7, then stop. #11 tabs no refills

## 2016-09-29 ENCOUNTER — Encounter: Payer: Self-pay | Admitting: Gastroenterology

## 2016-10-04 ENCOUNTER — Other Ambulatory Visit: Payer: Self-pay | Admitting: Internal Medicine

## 2016-10-11 ENCOUNTER — Other Ambulatory Visit: Payer: Self-pay | Admitting: Internal Medicine

## 2016-10-29 ENCOUNTER — Telehealth: Payer: Self-pay | Admitting: Pulmonary Disease

## 2016-10-29 ENCOUNTER — Encounter: Payer: Self-pay | Admitting: Pulmonary Disease

## 2016-10-29 ENCOUNTER — Ambulatory Visit (INDEPENDENT_AMBULATORY_CARE_PROVIDER_SITE_OTHER): Payer: Medicare Other | Admitting: Pulmonary Disease

## 2016-10-29 VITALS — BP 130/80 | HR 88 | Ht 63.0 in | Wt 286.0 lb

## 2016-10-29 DIAGNOSIS — G4733 Obstructive sleep apnea (adult) (pediatric): Secondary | ICD-10-CM

## 2016-10-29 DIAGNOSIS — Z9989 Dependence on other enabling machines and devices: Secondary | ICD-10-CM

## 2016-10-29 DIAGNOSIS — Z6841 Body Mass Index (BMI) 40.0 and over, adult: Secondary | ICD-10-CM

## 2016-10-29 NOTE — Patient Instructions (Signed)
Will try to get a copy of your previous sleep study from [facility]  Will arrange for a new CPAP machine  Follow up in 2 months after getting new CPAP machine

## 2016-10-29 NOTE — Telephone Encounter (Signed)
Please let her know that her insurance requires that she have a new sleep study prior to getting a new CPAP machine.  If she is okay with this, then please send order to get home sleep study.

## 2016-10-29 NOTE — Progress Notes (Signed)
   Subjective:    Patient ID: Alexis Grant, female    DOB: 06/02/1950, 66 y.o.   MRN: [id number]  HPI    Review of Systems  Constitutional: Positive for unexpected weight change. Negative for fever.  HENT: Positive for sinus pressure. Negative for congestion, dental problem, ear pain, nosebleeds, postnasal drip, rhinorrhea, sneezing, sore throat and trouble swallowing.   Eyes: Negative for redness and itching.  Respiratory: Positive for cough, shortness of breath and wheezing. Negative for chest tightness.   Cardiovascular: Negative for palpitations and leg swelling.  Gastrointestinal: Negative for nausea and vomiting.  Genitourinary: Negative for dysuria.  Musculoskeletal: Positive for joint swelling.  Skin: Negative for rash.  Allergic/Immunologic: Positive for environmental allergies. Negative for food allergies and immunocompromised state.  Neurological: Positive for headaches.  Hematological: Does not bruise/bleed easily.  Psychiatric/Behavioral: Negative for dysphoric mood. The patient is not nervous/anxious.        Objective:   Physical Exam        Assessment & Plan:

## 2016-10-29 NOTE — Progress Notes (Signed)
Past Surgical History She  has a past surgical history that includes Cholecystectomy (1985); Abdominal hysterectomy (1988); Tubal ligation (1986); Total knee arthroplasty (Right, 11/2005); Cardiac catheterization (2007); Total knee arthroplasty (Left); steroid injection to left si joint (12/2008); Polypectomy (2012); Colonoscopy with propofol (N/A, 07/16/2014); Colonoscopy; and Colonoscopy with propofol (N/A, 09/21/2016).  Allergies  Allergen Reactions  . Hydrocodone-Acetaminophen Itching    Itches all over; without rash per pt    Family History Her family history includes Colon polyps in her brother and mother; Diabetes in her paternal aunt and sister; Esophageal cancer in her brother; Kidney cancer in her sister; Ulcers in her father.  Social History She  reports that she has never smoked. She has never used smokeless tobacco. She reports that she does not drink alcohol or use drugs.  Review of systems Constitutional: Positive for unexpected weight change. Negative for fever.  HENT: Positive for sinus pressure. Negative for congestion, dental problem, ear pain, nosebleeds, postnasal drip, rhinorrhea, sneezing, sore throat and trouble swallowing.   Eyes: Negative for redness and itching.  Respiratory: Positive for cough, shortness of breath and wheezing. Negative for chest tightness.   Cardiovascular: Negative for palpitations and leg swelling.  Gastrointestinal: Negative for nausea and vomiting.  Genitourinary: Negative for dysuria.  Musculoskeletal: Positive for joint swelling.  Skin: Negative for rash.  Allergic/Immunologic: Positive for environmental allergies. Negative for food allergies and immunocompromised state.  Neurological: Positive for headaches.  Hematological: Does not bruise/bleed easily.  Psychiatric/Behavioral: Negative for dysphoric mood. The patient is not nervous/anxious.     Current Outpatient Prescriptions on File Prior to Visit  Medication Sig  . divalproex  (DEPAKOTE) 500 MG DR tablet Take 1 tablet in AM, 2 tablets in PM  . Dulaglutide (TRULICITY) 1.5 KY/7.0WC SOPN Inject 1.5 mg into the skin once a week. (Patient taking differently: Inject 1.5 mg into the skin every Tuesday. )  . DULoxetine (CYMBALTA) 30 MG capsule TAKE 1 CAPSULE BY MOUTH ONCE DAILY (Patient taking differently: TAKE 1 CAPSULE BY MOUTH ONCE DAILY AT NIGHT)  . Eluxadoline (VIBERZI) 75 MG TABS Take 1 tablet by mouth 2 (two) times daily. KEEP APPT FOR FURTHER REFILLS  . Insulin Pen Needle (B-D UF III MINI PEN NEEDLES) 31G X 5 MM MISC USE TO INJECT INSULIN DAILY. DX E11.9 .  . levothyroxine (SYNTHROID, LEVOTHROID) 50 MCG tablet Take 1 tablet (50 mcg total) by mouth daily.  [name] losartan (COZAAR) 50 MG tablet Take 1 tablet (50 mg total) by mouth daily. (Patient taking differently: Take 50 mg by mouth at bedtime. )  . metFORMIN (GLUCOPHAGE) 1000 MG tablet TAKE ONE TABLET BY MOUTH ONCE DAILY WITH  BREAKFAST  . predniSONE (DELTASONE) 20 MG tablet Take 1 tablet (20 mg total) by mouth daily. On day 1 take 3 tablets.  Day 2 take 2.5 tablets Day 3 take 2 tablets Day 4 take 1.5 tablets Day 5 take 1 tablet Day 6 and 7 take .5 tablet  . ranitidine (ZANTAC) 150 MG tablet Take 150 mg by mouth daily.  . simvastatin (ZOCOR) 20 MG tablet TAKE 1 TABLET BY MOUTH AT BEDTIME  . topiramate (TOPAMAX) 25 MG tablet Take 3 tablets every night  . traZODone (DESYREL) 50 MG tablet TAKE 1 TABLET BY MOUTH AT BEDTIME AS NEEDED FOR SLEEP   No current facility-administered medications on file prior to visit.     Chief Complaint  Patient presents with  . Sleep Consult    Pt referred by Dr. Billey Gosling MD. Pt use to  have CPAP machine at night, felt like she could not breath not getting enough air when sleeping. Not used the machine in over 6 months. Pt sleeps one hour at a time, than is up for a while and sleep an hour than up again. Pt has daytime sleepyness. Pt has dry cough during the day with wheezing and some  SOB.    Past medical history She  has a past medical history of Adenomatous polyp of colon (2008); Allergy; Anemia; Anxiety; Arthritis; Blood transfusion without reported diagnosis; Cataract; Depression; Diabetes mellitus; Dyslipidemia; Fibromyalgia; Gallstones; Gastritis; GERD (gastroesophageal reflux disease); Hemangioma; Hiatal hernia; Hyperlipidemia; Hypertension; Hypothyroidism (2010); IBS (irritable bowel syndrome); Migraines; Myocardial infarct ([place]) (2010); Obesity; OSA (obstructive sleep apnea); Pneumonia; Seizure disorder ([place]); Seizures ([place]); Sleep apnea; and Urinary tract infection.  Vital signs BP 130/80 (BP Location: Left Arm, Cuff Size: Normal)   Pulse 88   Ht 5\' 3"  (1.6 m)   Wt 286 lb (129.7 kg)   SpO2 95%   BMI 50.66 kg/m   History of Present Illness Alexis Grant is a 66 y.o. female for evaluation of sleep problems.  She had a sleep study years ago.  She was started on CPAP and did well.  He machine stopped delivering effective pressure and eventually died.  Her sleep has been miserable since the machine stopped working.  She uses [facility] for supplies, but didn't realize she could get a new machine from them.  Currently her snoring is terrible.  She wakes up feeling choked.  She is restless at night and tired all day.  None of these were present when she was able to use CPAP.  She goes to sleep at 11 pm.  She falls asleep 2 horus.  She wakes up 3 or 4 times to use the bathroom.  She gets out of bed at 6 am.  She feels tired in the morning.  She denies morning headache.  She uses trazodone to help sleep at night.  She drinks diet coke during the day.  She denies sleep walking, sleep talking, bruxism, or nightmares.  There is no history of restless legs.  She denies sleep hallucinations, sleep paralysis, or cataplexy.  The Epworth score is 16 out of 24.   Physical Exam:  General - No distress Eyes - pupils reactive ENT - No sinus tenderness, no oral  exudate, no LAN, no thyromegaly, TM clear, pupils equal/reactive, MP 3, 2+ tonsils, low laying soft palate Cardiac - s1s2 regular, no murmur, pulses symmetric Chest - No wheeze/rales/dullness, good air entry, normal respiratory excursion Back - No focal tenderness Abd - Soft, non-tender, no organomegaly, + bowel sounds Ext - No edema Neuro - Normal strength, cranial nerves intact Skin - No rashes Psych - Normal mood, and behavior  Discussion: She has history of sleep apnea and was doing well until her CPAP stopped functioning.  Since then she has recurrence of snoring, sleep disruption, apnea, and daytime sleepiness.  She also has history of Anxiety, Depression, DM, fibromyalgia, HTN.  She likely still has sleep apnea and needs to be set up with new CPAP machine.  We discussed how sleep apnea can affect various health problems, including risks for hypertension, cardiovascular disease, and diabetes.  We also discussed how sleep disruption can increase risks for accidents, such as while driving.  Weight loss as a means of improving sleep apnea was also reviewed.  Additional treatment options discussed were CPAP therapy, oral appliance, and surgical intervention.   Assessment/plan:  Obstructive sleep apnea. - will try to arrange for new auto CPAP machine - explained she might need repeat sleep study for insurance coverage of new machine >> could do home sleep study - will try to get a copy of her prior sleep study  Obesity. - discussed importance of weight loss   Patient Instructions  Will try to get a copy of your previous sleep study from [facility]  Will arrange for a new CPAP machine  Follow up in 2 months after getting new CPAP machine    Alexis Grant, M.D. Pager [phone] 10/29/2016, 11:29 AM

## 2016-10-29 NOTE — Telephone Encounter (Signed)
-----   Message from Darlina Guys sent at 10/29/2016  3:20 PM EDT ----- Regarding: RE: New Cpap Setup Good afternoon.  I'm sending a staff message since your phone system is not working today. This pt has a documented break in therapy (office visit note states that she hasn't used her unit in 6 months).  She has traditional Medicare and they will require that she start the process over again with a new sleep study. We will call the patient to let her know.  Thank you Melissa/[facility] ----- Message ----- From: Tana Coast Sent: 10/29/2016  11:28 AM To: Melissa Stenson Subject: New Cpap Setup                                 Melissa this order has been placed by [facility]  Would like to get a copy of the sleep study also if you have it  Thanks, Rodena Piety

## 2016-11-01 NOTE — Telephone Encounter (Signed)
Spoke with patient about CPAP machine and the need for a HST. She stated that she is ok with an order for a HST. Will go ahead and place order. Nothing else needed at time of call.

## 2016-11-14 NOTE — Patient Instructions (Addendum)
  Alexis Grant , Thank you for taking time to come for your Medicare Wellness Visit. I appreciate your ongoing commitment to your health goals. Please review the following plan we discussed and let me know if I can assist you in the future.   These are the goals we discussed: Goals    Start regular exercise, work on changing diet      This is a list of the screening recommended for you and due dates:  Health Maintenance  Topic Date Due  . DEXA scan (bone density measurement)  12/04/2015  . Eye exam for diabetics  04/01/2016  . Complete foot exam   11/24/2016  . Hemoglobin A1C  02/16/2017  . Pneumonia vaccines (2 of 2 - PPSV23) 05/24/2017  . Mammogram  09/04/2017  . Tetanus Vaccine  10/05/2018  . Colon Cancer Screening  09/22/2026  . Flu Shot  Completed  .  Hepatitis C: One time screening is recommended by Center for Disease Control  (CDC) for  adults born from [birth year] through 1965.   Completed  . HIV Screening  Completed      Test(s) ordered today. Your results will be released to [facility] (or called to you) after review, usually within 72hours after test completion. If any changes need to be made, you will be notified at that same time.  All other Health Maintenance issues reviewed.   All recommended immunizations and age-appropriate screenings are up-to-date or discussed.  Flu immunization administered today.   An EKG was done today.   Medications reviewed and updated.  Changes include starting omeprazole 40 mg daily for your heartburn, increasing trazodone to 100 mg nightly for your sleep and starting jardiance for your sugars.   Your prescription(s) have been submitted to your pharmacy. Please take as directed and contact our office if you believe you are having problem(s) with the medication(s).   Please followup in 3 months

## 2016-11-14 NOTE — Progress Notes (Addendum)
Subjective:    Patient ID: Alexis Grant, female    DOB: [birth year], 66 y.o.   MRN: [id number]  HPI Here for a welcome to medicare wellness exam and follow up of her chronic medical problems.   I have personally reviewed and have noted 1.The patient's medical and social history 2.Their use of alcohol, tobacco or illicit drugs 3.Their current medications and supplements 4.The patient's functional ability including ADL's, fall risks, home                 safety risk and hearing or visual impairment. 5.Diet and physical activities 6.Evidence for depression or mood disorders 7.Care team reviewed  -pulmonary-Dr. Halford Chessman, neurology-Dr. [name], gastroenterology-Dr. Fuller Plan, [place] - Dr Herbert Deaner    Diabetes: She is taking her medication daily as prescribed. She is compliant with a diabetic diet. She eats one meal a day due to having no appetite.  She is not exercising regularly. She monitors her sugars and they have been running 240-250 in mornings, in afternoon it can be to 350.  Her eye exam is up to date  Hypertension: She is taking her medication daily. She is compliant with a low sodium diet.  She denies chest pain, palpitations, edema, shortness of breath and lightheadedness. She is not exercising regularly.      Hyperlipidemia: She is taking her medication daily. She is compliant with a low fat/cholesterol diet. She is not exercising regularly. She denies myalgias.   GERD:  She is taking her zantac once a day.  She has GERD daily.  It is worse at night.     Difficulty sleeping: She is taking trazodone nightly as prescribed.  She feels her sleep is not well controlled.  Some days she barely sleeps.    Depression: She is taking her medication daily as prescribed. She denies any side effects from the medication. She feels her depression is controlled and she is happy with her current dose of medication.     Are there smokers in  your home (other than you)? No  Risk Factors Exercise:  Not active Dietary issues discussed:   Eating once a day, eats a lot of salads, avoiding sugars / carbs  Vitamin and supplement use:  Not taking any  Opiod use:  none   Cardiac risk factors: advanced age, CAD, hypertension, hyperlipidemia, and obesity (BMI >= 30 kg/m2).  Depression Screen  Have you felt down, depressed or hopeless? Yes, sometimes.  Have you felt little interest or pleasure in doing things?  No  Activities of Daily Living In your present state of health, do you have any difficulty performing the following activities?:  Driving? No Managing money?  No Feeding yourself? No Getting from bed to chair? No Climbing a flight of stairs? Yes - due to knees Preparing food and eating?: No Bathing or showering? No Getting dressed: No Getting to/using the toilet? No Moving around from place to place: No In the past year have you fallen or had a near fall?: yes one month ago - dizzy spell in the shower and fell   Are you sexually active?  No  Hearing Difficulties: yes - just got hearing aids Do you often ask people to speak up or repeat themselves? yes Do you experience ringing or noises in your ears? No Do you have difficulty understanding soft or whispered voices? yes Vision:              Any change in vision:  no  Up to date with eye exam:   Yes - due in Jan  Memory:  Do you feel that you have a problem with memory? Yes, short term              memory  Do you often misplace items? No  Do you feel safe at home?  Yes  Cognitive Testing  Alert, Orientated? Yes  Normal Appearance? Yes  Recall of three objects?  Yes - apple, table, plane   - 2/3  Can perform simple calculations? Yes  Displays appropriate judgment? Yes  Can read the correct time from a watch face? Yes   Advanced Directives have been discussed with the patient? Yes   Medications and allergies reviewed with patient and updated if  appropriate.  Patient Active Problem List   Diagnosis Date Noted  . Cough 08/16/2016  . Difficulty sleeping 05/24/2016  . Rosacea 11/25/2015  . Poor balance 11/25/2015  . Tremor 04/18/2015  . Other fatigue 02/17/2015  . Migraine with aura and without status migrainosus, not intractable 12/04/2014  . 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 Hx of adenomatous colonic polyps 07/16/2014  . Benign neoplasm of ascending colon 07/16/2014  . Benign neoplasm of descending colon 07/16/2014  . Benign neoplasm of cecum 07/16/2014  . Benign neoplasm of transverse colon 07/16/2014  . Fibromyalgia 05/30/2013  . Generalized convulsive epilepsy ([place]) 10/20/2012  . Seasonal allergies 12/08/2011  . DM (diabetes mellitus), type 2 with peripheral vascular complications ([place]) 63/14/9702  . Essential hypertension 01/05/2010  . Irritable bowel syndrome 01/05/2010  . VERTIGO 09/16/2009  . VITAMIN D DEFICIENCY 02/24/2009  . MYOCARDIAL INFARCTION, ACUTE, SUBENDOCARDIAL 10/04/2008  . Hypothyroidism 03/19/2008  . COLONIC POLYPS, ADENOMATOUS, HX OF 08/07/2007  . HEMANGIOMA 04/26/2007  . Dyslipidemia 04/26/2007  . OBESITY 04/26/2007  . Depression 04/26/2007  . SLEEP APNEA, OBSTRUCTIVE 04/26/2007  . HIATAL HERNIA 04/26/2007  . FATTY LIVER DISEASE 04/26/2007  . ARTHRITIS 04/26/2007    Current Outpatient Prescriptions on File Prior to Visit  Medication Sig Dispense Refill  . divalproex (DEPAKOTE) 500 MG DR tablet Take 1 tablet in AM, 2 tablets in PM 90 tablet 11  . Dulaglutide (TRULICITY) 1.5 OV/7.8HY SOPN Inject 1.5 mg into the skin once a week. (Patient taking differently: Inject 1.5 mg into the skin every Tuesday. ) 4 pen 5  . DULoxetine (CYMBALTA) 30 MG capsule TAKE 1 CAPSULE BY MOUTH ONCE DAILY (Patient taking differently: TAKE 1 CAPSULE BY MOUTH ONCE DAILY AT  NIGHT) 90 capsule 1  . Insulin Pen Needle (B-D UF III MINI PEN NEEDLES) 31G X 5 MM MISC USE TO INJECT INSULIN DAILY. DX E11.9 . 100 each 3  . levothyroxine (SYNTHROID, LEVOTHROID) 50 MCG tablet Take 1 tablet (50 mcg total) by mouth daily. 90 tablet 3  . losartan (COZAAR) 50 MG tablet Take 1 tablet (50 mg total) by mouth daily. (Patient taking differently: Take 50 mg by mouth at bedtime. ) 90 tablet 0  . metFORMIN (GLUCOPHAGE) 1000 MG tablet TAKE ONE TABLET BY MOUTH ONCE DAILY WITH  BREAKFAST 90 tablet 0  . ranitidine (ZANTAC) 150 MG tablet Take 150 mg by mouth daily.    . simvastatin (ZOCOR) 20 MG tablet TAKE 1 TABLET BY MOUTH AT BEDTIME 90 tablet 0  . topiramate (TOPAMAX) 25 MG tablet Take 3 tablets every night 90 tablet 11  . traZODone (DESYREL) 50 MG tablet TAKE 1 TABLET BY MOUTH  AT BEDTIME AS NEEDED FOR SLEEP 90 tablet 0   No current facility-administered medications on file prior to visit.     Past Medical History:  Diagnosis Date  . Adenomatous polyp of colon 2008  . Allergy   . Anemia   . Anxiety   . Arthritis   . Blood transfusion without reported diagnosis    following knee surgery   . Cataract    hx- removed both eyes  . Depression   . Diabetes mellitus    type II   . Dyslipidemia   . Fibromyalgia   . Gallstones   . Gastritis   . GERD (gastroesophageal reflux disease)   . Hemangioma   . Hiatal hernia    Fibromyalgia  . Hyperlipidemia   . Hypertension   . Hypothyroidism 2010   Dr.Deveshawr  . IBS (irritable bowel syndrome)    Dr Fuller Plan  . Migraines   . Myocardial infarct ([place]) 2010   subendocardrial, following R TKR  . Obesity   . OSA (obstructive sleep apnea)    cpap - does not know settings   . Pneumonia   . Seizure disorder ([place])   . Seizures ([place])    last seizure was 2002 per pt 07-29-16  . Sleep apnea   . Urinary tract infection     Past Surgical History:  Procedure Laterality Date  . ABDOMINAL HYSTERECTOMY  1988  . CARDIAC CATHETERIZATION  2007     DrGamble ( now seeing Dr Tonita Cong cardiology)  . CHOLECYSTECTOMY  1985  . COLONOSCOPY    . COLONOSCOPY WITH PROPOFOL N/A 07/16/2014   Procedure: COLONOSCOPY WITH PROPOFOL;  Surgeon: Ladene Artist, MD;  Location: [facility];  Service: Endoscopy;  Laterality: N/A;  . COLONOSCOPY WITH PROPOFOL N/A 09/21/2016   Procedure: COLONOSCOPY WITH PROPOFOL;  Surgeon: Ladene Artist, MD;  Location: [facility];  Service: Endoscopy;  Laterality: N/A;  . POLYPECTOMY  2012   6 or 7 adenomatous, Dr.Stark  . steroid injection to left si joint  12/2008   Dr.Newton  . TOTAL KNEE ARTHROPLASTY Right 11/2005  . TOTAL KNEE ARTHROPLASTY Left   . TUBAL LIGATION  1986    Social History   Social History  . Marital status: Widowed    Spouse name: N/A  . Number of children: 3  . Years of education: college   Occupational History  . Retired    Social History Main Topics  . Smoking status: Never Smoker  . Smokeless tobacco: Never Used  . Alcohol use No  . Drug use: No  . Sexual activity: Not on file   Other Topics Concern  . Not on file   Social History Narrative   Illicit drug use- no   Patient does not get regular exercise due to knee.   Patient lives at home alone.    Patient son and wife lives next door.   Patient has 3 children.    Patient has some college.    Patient retired May 2014.     Family History  Problem Relation Age of Onset  . Colon polyps Mother   . Ulcers Father   . Colon polyps Brother   . Diabetes Sister   . Diabetes Paternal Aunt   . Esophageal cancer Brother   . Kidney cancer Sister   . Colon cancer Neg Hx   . Heart attack Neg Hx   . Stroke Neg Hx   . Cancer Neg Hx   . Gallbladder disease Neg Hx   .  Rectal cancer Neg Hx   . Stomach cancer Neg Hx     Review of Systems  Constitutional: Negative for chills and fever.  HENT: Positive for hearing loss. Negative for tinnitus.   Eyes: Negative for visual disturbance.  Respiratory: Positive for cough (cough  all day and night. dry). Negative for shortness of breath and wheezing.   Cardiovascular: Negative for chest pain, palpitations and leg swelling.  Gastrointestinal: Positive for diarrhea (regularly). Negative for abdominal pain, blood in stool, constipation and nausea.       GERD daily  Genitourinary: Negative for dysuria and hematuria.  Musculoskeletal: Negative for arthralgias (thumbs, fingers, R elbow, L ankle, L hip, knee).  Skin: Negative for color change and rash.  Neurological: Positive for dizziness (occasional) and headaches. Negative for light-headedness.  Psychiatric/Behavioral: Positive for dysphoric mood. The patient is nervous/anxious.        Objective:   Vitals:   11/15/16 0900  BP: 124/84  Pulse: 78  Resp: 16  Temp: 98.3 F (36.8 C)  SpO2: 98%   Filed Weights   11/15/16 0900  Weight: 293 lb (132.9 kg)   Body mass index is 51.9 kg/m.  Wt Readings from Last 3 Encounters:  11/15/16 293 lb (132.9 kg)  10/29/16 286 lb (129.7 kg)  09/21/16 290 lb (131.5 kg)    Visual Acuity Screening   Right eye Left eye Both eyes  Without correction: 20/70 20/25 20/15   With correction:         Physical Exam Constitutional: She appears well-developed and well-nourished. No distress.  HENT:  Head: Normocephalic and atraumatic.  Right Ear: External ear normal. Normal ear canal and TM Left Ear: External ear normal.  Normal ear canal and TM Mouth/Throat: Oropharynx is clear and moist.  Eyes: Conjunctivae and EOM are normal.  Neck: Neck supple. No tracheal deviation present. No thyromegaly present.  No carotid bruit  Cardiovascular: Normal rate, regular rhythm and normal heart sounds.   No murmur heard.  No edema. Pulmonary/Chest: Effort normal and breath sounds normal. No respiratory distress. She has no wheezes. She has no rales.  Breast: deferred  Abdominal: Soft. She exhibits no distension. There is no tenderness.  Lymphadenopathy: She has no cervical adenopathy.    Skin: Skin is warm and dry. She is not diaphoretic.  Psychiatric: mildly depressed mood and affect. Her behavior is normal.   Diabetic Foot Exam - Simple   Simple Foot Form Diabetic Foot exam was performed with the following findings:  Yes 11/15/2016  9:59 AM  Visual Inspection No deformities, no ulcerations, no other skin breakdown bilaterally:  Yes Sensation Testing Intact to touch and monofilament testing bilaterally:  Yes Pulse Check Posterior Tibialis and Dorsalis pulse intact bilaterally:  Yes Comments          Assessment & Plan:   Wellness Exam: Immunizations   flu vaccine today, discussed shingrix, other vaccines up-to-date Colonoscopy   up-to-date Mammogram   up-to-date Dexa   Never had one - will order Gyn   No longer seeing gyn EKG:  Last 2016 -- will repeat one today - sinus at 72 bpm, normal EKG, no change from prior Eye exam  Up to date  - due in January Hearing loss  Yes, just got hearing aids Memory concerns/difficulties   Yes, some short term memory issues - difficult to know if related to true memory loss - not sleeping and has increase anxiety/depression.  Will check labs and work on improving sleep.  Can consider full neuropsych eval  through neuro Independent of ADLs  Yes, but has difficulty with stairs Stressed the importance of regular exercise - work on getting back to water exercises   Patient received copy of preventative screening tests/immunizations recommended for the next 5-10 years.   See Problem List for Assessment and Plan of chronic medical problems.

## 2016-11-14 NOTE — Assessment & Plan Note (Signed)
Cholesterol has been well controlled Recheck lipid panel Continue statin

## 2016-11-15 ENCOUNTER — Encounter: Payer: Self-pay | Admitting: Internal Medicine

## 2016-11-15 ENCOUNTER — Ambulatory Visit (INDEPENDENT_AMBULATORY_CARE_PROVIDER_SITE_OTHER): Payer: Medicare Other | Admitting: Internal Medicine

## 2016-11-15 VITALS — BP 124/84 | HR 78 | Temp 98.3°F | Resp 16 | Wt 293.0 lb

## 2016-11-15 DIAGNOSIS — Z23 Encounter for immunization: Secondary | ICD-10-CM | POA: Diagnosis not present

## 2016-11-15 DIAGNOSIS — E039 Hypothyroidism, unspecified: Secondary | ICD-10-CM

## 2016-11-15 DIAGNOSIS — F329 Major depressive disorder, single episode, unspecified: Secondary | ICD-10-CM | POA: Diagnosis not present

## 2016-11-15 DIAGNOSIS — I10 Essential (primary) hypertension: Secondary | ICD-10-CM

## 2016-11-15 DIAGNOSIS — K219 Gastro-esophageal reflux disease without esophagitis: Secondary | ICD-10-CM | POA: Diagnosis not present

## 2016-11-15 DIAGNOSIS — E1151 Type 2 diabetes mellitus with diabetic peripheral angiopathy without gangrene: Secondary | ICD-10-CM

## 2016-11-15 DIAGNOSIS — E785 Hyperlipidemia, unspecified: Secondary | ICD-10-CM

## 2016-11-15 DIAGNOSIS — R202 Paresthesia of skin: Secondary | ICD-10-CM

## 2016-11-15 DIAGNOSIS — Z13820 Encounter for screening for osteoporosis: Secondary | ICD-10-CM | POA: Diagnosis not present

## 2016-11-15 DIAGNOSIS — F32A Depression, unspecified: Secondary | ICD-10-CM

## 2016-11-15 DIAGNOSIS — E2839 Other primary ovarian failure: Secondary | ICD-10-CM

## 2016-11-15 DIAGNOSIS — G479 Sleep disorder, unspecified: Secondary | ICD-10-CM

## 2016-11-15 DIAGNOSIS — Z0000 Encounter for general adult medical examination without abnormal findings: Secondary | ICD-10-CM | POA: Diagnosis not present

## 2016-11-15 MED ORDER — OMEPRAZOLE 40 MG PO CPDR: 40.0000 mg | DELAYED_RELEASE_CAPSULE | Freq: Every day | ORAL | 1 refills | Status: DC

## 2016-11-15 MED ORDER — EMPAGLIFLOZIN 10 MG PO TABS: 10.0000 mg | ORAL_TABLET | Freq: Every day | ORAL | 5 refills | Status: DC

## 2016-11-15 MED ORDER — TRAZODONE HCL 50 MG PO TABS: 100.0000 mg | ORAL_TABLET | Freq: Every day | ORAL | 1 refills | Status: DC

## 2016-11-15 NOTE — Assessment & Plan Note (Signed)
Having GERD daily on otc zantac Start omeprazole 40 mg daily

## 2016-11-15 NOTE — Assessment & Plan Note (Signed)
Not controlled Continue trulicity and metformin ( can not increase due to diarrhea) Start jardiance  - if covered - if not may need to try glimepiride Start regular exercise Try to eat more frequently F/u in 3 months

## 2016-11-15 NOTE — Assessment & Plan Note (Signed)
Taking trazodone -  Initially worked, but not working now Try increasing trazodone to 100 mg  Melatonin not effective.

## 2016-11-15 NOTE — Assessment & Plan Note (Signed)
Check tsh  Titrate med dose if needed  

## 2016-11-15 NOTE — Assessment & Plan Note (Addendum)
Intermittent at night, mild Need to get sugars better controlled Check B12 level

## 2016-11-15 NOTE — Assessment & Plan Note (Addendum)
Some days she feels overwhelmed, but feels depression is fairly controlled She does have some anxiety, but she thinks she can handle it Will continue cymbalta at current dose

## 2016-11-15 NOTE — Assessment & Plan Note (Signed)
BP well controlled Current regimen effective and well tolerated Continue current medications at current doses cmp  

## 2016-11-16 ENCOUNTER — Other Ambulatory Visit: Payer: Medicare Other

## 2016-11-17 ENCOUNTER — Other Ambulatory Visit (INDEPENDENT_AMBULATORY_CARE_PROVIDER_SITE_OTHER): Payer: Medicare Other

## 2016-11-17 ENCOUNTER — Ambulatory Visit (INDEPENDENT_AMBULATORY_CARE_PROVIDER_SITE_OTHER)
Admission: RE | Admit: 2016-11-17 | Discharge: 2016-11-17 | Disposition: A | Discharge status: Home / Self Care | Payer: Medicare Other | Source: Ambulatory Visit | Attending: Internal Medicine | Admitting: Internal Medicine

## 2016-11-17 DIAGNOSIS — Z13820 Encounter for screening for osteoporosis: Secondary | ICD-10-CM

## 2016-11-17 DIAGNOSIS — E785 Hyperlipidemia, unspecified: Secondary | ICD-10-CM

## 2016-11-17 DIAGNOSIS — E039 Hypothyroidism, unspecified: Secondary | ICD-10-CM | POA: Diagnosis not present

## 2016-11-17 DIAGNOSIS — E2839 Other primary ovarian failure: Secondary | ICD-10-CM | POA: Diagnosis not present

## 2016-11-17 DIAGNOSIS — R202 Paresthesia of skin: Secondary | ICD-10-CM

## 2016-11-17 DIAGNOSIS — I10 Essential (primary) hypertension: Secondary | ICD-10-CM

## 2016-11-17 DIAGNOSIS — E1151 Type 2 diabetes mellitus with diabetic peripheral angiopathy without gangrene: Secondary | ICD-10-CM

## 2016-11-17 LAB — HEMOGLOBIN A1C: Hgb A1c MFr Bld: 10.3 % — ABNORMAL HIGH (ref 4.6–6.5)

## 2016-11-17 LAB — LIPID PANEL
CHOL/HDL RATIO: 3
Cholesterol: 158 mg/dL (ref 0–200)
HDL: 49.4 mg/dL (ref >39.00)
LDL Cholesterol: 81 mg/dL (ref 0–99)
NONHDL: 108.71
Triglycerides: 141 mg/dL (ref 0.0–149.0)
VLDL: 28.2 mg/dL (ref 0.0–40.0)

## 2016-11-17 LAB — CBC WITH DIFFERENTIAL/PLATELET
Basophils Absolute: 0.1 10*3/uL (ref 0.0–0.1)
Basophils Relative: 0.8 % (ref 0.0–3.0)
EOS PCT: 1.8 % (ref 0.0–5.0)
Eosinophils Absolute: 0.1 10*3/uL (ref 0.0–0.7)
HCT: 42.6 % (ref 36.0–46.0)
Hemoglobin: 14.2 g/dL (ref 12.0–15.0)
LYMPHS ABS: 2.8 10*3/uL (ref 0.7–4.0)
Lymphocytes Relative: 39.4 % (ref 12.0–46.0)
MCHC: 33.4 g/dL (ref 30.0–36.0)
MCV: 86.8 fl (ref 78.0–100.0)
MONO ABS: 0.6 10*3/uL (ref 0.1–1.0)
Monocytes Relative: 8 % (ref 3.0–12.0)
NEUTROS PCT: 50 % (ref 43.0–77.0)
Neutro Abs: 3.5 10*3/uL (ref 1.4–7.7)
Platelets: 181 10*3/uL (ref 150.0–400.0)
RBC: 4.91 Mil/uL (ref 3.87–5.11)
RDW: 15.6 % — AB (ref 11.5–15.5)
WBC: 7 10*3/uL (ref 4.0–10.5)

## 2016-11-17 LAB — COMPREHENSIVE METABOLIC PANEL
ALT: 8 U/L (ref 0–35)
AST: 7 U/L (ref 0–37)
Albumin: 3.6 g/dL (ref 3.5–5.2)
Alkaline Phosphatase: 62 U/L (ref 39–117)
BUN: 8 mg/dL (ref 6–23)
CHLORIDE: 98 meq/L (ref 96–112)
CO2: 30 mEq/L (ref 19–32)
Calcium: 9.2 mg/dL (ref 8.4–10.5)
Creatinine, Ser: 0.6 mg/dL (ref 0.40–1.20)
GFR: 106.32 mL/min (ref >60.00)
GLUCOSE: 276 mg/dL — AB (ref 70–99)
POTASSIUM: 3.6 meq/L (ref 3.5–5.1)
SODIUM: 136 meq/L (ref 135–145)
Total Bilirubin: 0.6 mg/dL (ref 0.2–1.2)
Total Protein: 6.5 g/dL (ref 6.0–8.3)

## 2016-11-17 LAB — TSH: TSH: 5.89 u[IU]/mL — AB (ref 0.35–4.50)

## 2016-11-17 LAB — VITAMIN B12: VITAMIN B 12: 465 pg/mL (ref 211–911)

## 2016-11-18 ENCOUNTER — Telehealth: Payer: Self-pay | Admitting: Emergency Medicine

## 2016-11-18 NOTE — Telephone Encounter (Signed)
PA completed for Jardiance [place] [id number], awaiting approval

## 2016-11-22 ENCOUNTER — Other Ambulatory Visit: Payer: Self-pay | Admitting: Emergency Medicine

## 2016-11-22 ENCOUNTER — Encounter: Payer: Self-pay | Admitting: Internal Medicine

## 2016-11-22 ENCOUNTER — Encounter: Payer: Medicare Other | Admitting: Internal Medicine

## 2016-11-22 DIAGNOSIS — M8580 Other specified disorders of bone density and structure, unspecified site: Secondary | ICD-10-CM | POA: Insufficient documentation

## 2016-11-22 MED ORDER — EMPAGLIFLOZIN 25 MG PO TABS: 25.0000 mg | ORAL_TABLET | Freq: Every day | ORAL | 1 refills | Status: DC

## 2016-11-22 MED ORDER — LEVOTHYROXINE SODIUM 75 MCG PO TABS: 75.0000 ug | ORAL_TABLET | Freq: Every day | ORAL | 0 refills | Status: DC

## 2016-11-22 NOTE — Telephone Encounter (Signed)
PA was approved. Although Dr Quay Burow has now changed the RX to 25mg  tab.

## 2016-11-29 DIAGNOSIS — G4733 Obstructive sleep apnea (adult) (pediatric): Secondary | ICD-10-CM | POA: Diagnosis not present

## 2016-11-30 ENCOUNTER — Other Ambulatory Visit: Payer: Self-pay

## 2016-11-30 MED ORDER — LOSARTAN POTASSIUM 50 MG PO TABS: 50.0000 mg | ORAL_TABLET | Freq: Every day | ORAL | 0 refills | Status: DC

## 2016-12-06 ENCOUNTER — Telehealth: Payer: Self-pay | Admitting: Internal Medicine

## 2016-12-07 ENCOUNTER — Telehealth: Payer: Self-pay | Admitting: Pulmonary Disease

## 2016-12-07 ENCOUNTER — Encounter: Payer: Self-pay | Admitting: Family Medicine

## 2016-12-07 ENCOUNTER — Other Ambulatory Visit: Payer: Self-pay | Admitting: *Deleted

## 2016-12-07 ENCOUNTER — Ambulatory Visit (INDEPENDENT_AMBULATORY_CARE_PROVIDER_SITE_OTHER): Payer: Medicare Other | Admitting: Family Medicine

## 2016-12-07 VITALS — BP 126/74 | HR 92 | Temp 98.3°F | Resp 16 | Ht 63.0 in | Wt 285.5 lb

## 2016-12-07 DIAGNOSIS — R634 Abnormal weight loss: Secondary | ICD-10-CM | POA: Diagnosis not present

## 2016-12-07 DIAGNOSIS — R350 Frequency of micturition: Secondary | ICD-10-CM | POA: Diagnosis not present

## 2016-12-07 DIAGNOSIS — M545 Low back pain, unspecified: Secondary | ICD-10-CM

## 2016-12-07 DIAGNOSIS — R319 Hematuria, unspecified: Secondary | ICD-10-CM | POA: Diagnosis not present

## 2016-12-07 DIAGNOSIS — E1151 Type 2 diabetes mellitus with diabetic peripheral angiopathy without gangrene: Secondary | ICD-10-CM

## 2016-12-07 DIAGNOSIS — G4733 Obstructive sleep apnea (adult) (pediatric): Secondary | ICD-10-CM

## 2016-12-07 DIAGNOSIS — R310 Gross hematuria: Secondary | ICD-10-CM

## 2016-12-07 DIAGNOSIS — N390 Urinary tract infection, site not specified: Secondary | ICD-10-CM

## 2016-12-07 LAB — POCT URINALYSIS DIPSTICK
BILIRUBIN UA: NEGATIVE
Blood, UA: NEGATIVE
Leukocytes, UA: NEGATIVE
NITRITE UA: NEGATIVE
Protein, UA: NEGATIVE
Spec Grav, UA: 1.015 (ref 1.010–1.025)
UROBILINOGEN UA: 0.2 U/dL
pH, UA: 6 (ref 5.0–8.0)

## 2016-12-07 MED ORDER — SULFAMETHOXAZOLE-TRIMETHOPRIM 800-160 MG PO TABS: 1.0000 | ORAL_TABLET | Freq: Two times a day (BID) | ORAL | 0 refills | Status: AC

## 2016-12-07 NOTE — Progress Notes (Signed)
ACUTE VISIT HPI:  Chief Complaint  Patient presents with  . Hematuria  . Vaginal Irritation  . Urinary Frequency    [name] is a 66 y.o. female, who is here today complaining of 3-4 days of urinary symptoms.   Dysuria: Yes, burning sensation with urination. Urinary frequency: Yes. Urinary urgency: Yes Incontinence: Yes, new onset. Gross hematuria: Started yesterday. Noted on wipe after urination and some in toilet. She has not noted blood on underwear.  Abdominal pain: Denies Nausea or vomiting: Denies. Denies having vaginal bleeding or discharge. She has had vaginal pruritus for the past few days.  Sexual activity: No Hx of UTI: Has had UTI once a few years ago.  OTC medications for this problem: None  Hx of DM II. BS's mid 200's, not worse since she started with urinary symptoms.  Lab Results  Component Value Date   HGBA1C 10.3 (H) 11/17/2016   She also mentions abnormal weight loss for the past 8-9 months. She has lost about 38-39 pounds unintentional. According to the patient, her diabetes has been poorly controlled for the past 5 months.  She is currently on Metformin, Jardiance, and Trulicity. She also takes Topamax. History of hypothyroidism, currently she is on levothyroxine 75 mg daily.  Lab Results  Component Value Date   TSH 5.89 (H) 11/17/2016    "A little bit" of bilateral lower back pain, no radiated. She denies saddle anesthesia. She denies recent injury or history of back pain.  Review of Systems  Constitutional: Positive for fatigue and unexpected weight change. Negative for activity change, appetite change and fever.  HENT: Negative for mouth sores, sore throat and trouble swallowing.   Cardiovascular: Negative for leg swelling.  Gastrointestinal: Negative for abdominal pain, nausea and vomiting.  Endocrine: Negative for polydipsia and polyphagia.  Genitourinary: Positive for dysuria, frequency, hematuria and urgency.  Negative for decreased urine volume, vaginal bleeding and vaginal discharge.  Musculoskeletal: Positive for back pain. Negative for myalgias.  Skin: Negative for pallor and rash.  Neurological: Negative for weakness and numbness.  Hematological: Negative for adenopathy. Does not bruise/bleed easily.  Psychiatric/Behavioral: Negative for confusion. The patient is nervous/anxious.       Current Outpatient Medications on File Prior to Visit  Medication Sig Dispense Refill  . divalproex (DEPAKOTE) 500 MG DR tablet Take 1 tablet in AM, 2 tablets in PM 90 tablet 11  . Dulaglutide (TRULICITY) 1.5 OE/7.0JJ SOPN Inject 1.5 mg into the skin once a week. (Patient taking differently: Inject 1.5 mg into the skin every Tuesday. ) 4 pen 5  . DULoxetine (CYMBALTA) 30 MG capsule TAKE 1 CAPSULE BY MOUTH ONCE DAILY (Patient taking differently: TAKE 1 CAPSULE BY MOUTH ONCE DAILY AT NIGHT) 90 capsule 1  . empagliflozin (JARDIANCE) 25 MG TABS tablet Take 25 mg daily by mouth. 90 mg 1  . levothyroxine (SYNTHROID, LEVOTHROID) 75 MCG tablet Take 1 tablet (75 mcg total) daily by mouth. 90 tablet 0  . losartan (COZAAR) 50 MG tablet Take 1 tablet (50 mg total) daily by mouth. 90 tablet 0  . metFORMIN (GLUCOPHAGE) 1000 MG tablet TAKE ONE TABLET BY MOUTH ONCE DAILY WITH  BREAKFAST 90 tablet 0  . omeprazole (PRILOSEC) 40 MG capsule Take 1 capsule (40 mg total) by mouth daily. 90 capsule 1  . simvastatin (ZOCOR) 20 MG tablet TAKE 1 TABLET BY MOUTH AT BEDTIME 90 tablet 0  . topiramate (TOPAMAX) 25 MG tablet Take 3 tablets every night 90 tablet 11  .  traZODone (DESYREL) 50 MG tablet Take 2 tablets (100 mg total) by mouth at bedtime. for sleep 180 tablet 1   No current facility-administered medications on file prior to visit.      Past Medical History:  Diagnosis Date  . Adenomatous polyp of colon 2008  . Allergy   . Anemia   . Anxiety   . Arthritis   . Blood transfusion without reported diagnosis    following knee  surgery   . Cataract    hx- removed both eyes  . Depression   . Diabetes mellitus    type II   . Dyslipidemia   . Fibromyalgia   . Gallstones   . Gastritis   . GERD (gastroesophageal reflux disease)   . Hemangioma   . Hiatal hernia    Fibromyalgia  . Hyperlipidemia   . Hypertension   . Hypothyroidism 2010   Dr.Deveshawr  . IBS (irritable bowel syndrome)    Dr Fuller Plan  . Migraines   . Myocardial infarct ([place]) 2010   subendocardrial, following R TKR  . Obesity   . OSA (obstructive sleep apnea)    cpap - does not know settings   . Pneumonia   . Seizure disorder ([place])   . Seizures ([place])    last seizure was 2002 per pt 07-29-16  . Sleep apnea   . Urinary tract infection    Allergies  Allergen Reactions  . Hydrocodone-Acetaminophen Itching    Itches all over; without rash per pt    Social History   Socioeconomic History  . Marital status: Widowed    Spouse name: None  . Number of children: 3  . Years of education: college  . Highest education level: None  Social Needs  . Financial resource strain: None  . Food insecurity - worry: None  . Food insecurity - inability: None  . Transportation needs - medical: None  . Transportation needs - non-medical: None  Occupational History  . Occupation: Retired  Tobacco Use  . Smoking status: Never Smoker  . Smokeless tobacco: Never Used  Substance and Sexual Activity  . Alcohol use: No    Alcohol/week: 0.0 oz  . Drug use: No  . Sexual activity: None  Other Topics Concern  . None  Social History Narrative   Illicit drug use- no   Patient does not get regular exercise due to knee.   Patient lives at home alone.    Patient son and wife lives next door.   Patient has 3 children.    Patient has some college.    Patient retired May 2014.     Vitals:   12/07/16 1131  BP: 126/74  Pulse: 92  Resp: 16  Temp: 98.3 F (36.8 C)  SpO2: 97%   Body mass index is 50.57 kg/m.   Physical Exam  Nursing note and vitals  reviewed. Constitutional: She is oriented to person, place, and time. She appears well-developed. No distress.  HENT:  Head: Normocephalic and atraumatic.  Mouth/Throat: Oropharynx is clear and moist and mucous membranes are normal.  Eyes: Conjunctivae are normal.  Cardiovascular: Normal rate and regular rhythm.  No murmur heard. Respiratory: Effort normal and breath sounds normal. No respiratory distress.  GI: Soft. She exhibits no mass. There is no hepatomegaly. There is no tenderness. There is no CVA tenderness.  Genitourinary:  Genitourinary Comments: Refused.  Musculoskeletal: She exhibits no edema.       Lumbar back: She exhibits tenderness. She exhibits no bony tenderness.  Tenderness upon  palpation of lumbar paraspinal muscles, bilaterally (L3-4).  Lymphadenopathy:    She has no cervical adenopathy.  Neurological: She is alert and oriented to person, place, and time. She has normal strength.  Stable gait with no assistance. SLR negative bilateral.  Skin: Skin is warm. No rash noted. No erythema.  Psychiatric: Her speech is normal. Her mood appears anxious.  Fairly groomed,good eye contact.    ASSESSMENT AND PLAN:   Ms. Jenasis was seen today for hematuria,vaginal irritation and urinary frequency.  Diagnoses and all orders for this visit:  Frequency of urination  Symptoms suggest UTI but other etiologies need to be considered: Hyperglycemia, bladder lesions among some.  -     POCT urinalysis dipstick -     Urine culture  Gross hematuria  Today urine dipstick was negative for blood, so we need to consider vaginal source. She refused pelvic examination today.  -     Urine culture  Urinary tract infection with hematuria, site unspecified   Ucx ordered.   Empiric abx treatment started today and will be tailored according to Ucx results and susceptibility report.  Clearly instructed about warning signs. F/U if symptoms persist.  -      sulfamethoxazole-trimethoprim (BACTRIM DS,SEPTRA DS) 800-160 MG tablet; Take 1 tablet by mouth 2 (two) times daily for 3 days. -     Urine culture  Bilateral low back pain without sciatica, unspecified chronicity  It seems to be musculoskeletal,mild. Local heat/ice. I do not think imaging is needed today. Tylenol 500 mg 3 times daily as needed. OTC icy hot might also help. Follow-up as needed.  Weight loss, non-intentional  Recommend continuing monitoring weight. Some of her medications can contribute to weight loss as well as poorly controlled diabetes. Follow-up with PCP in 2 weeks.  DM (diabetes mellitus), type 2 with peripheral vascular complications (HCC)  Poorly controlled. No changes in current medications recently adjusted. Continue following with PCP.    -Ms.IRIANNA GILDAY was advised to return or notify a doctor immediately if symptoms worsen or persist or new concerns arise.       Jahmai Finelli G. [place], MD  [facility]. [place] office.

## 2016-12-07 NOTE — Telephone Encounter (Signed)
HST 12/03/16 >> AHI 24.4, SaO2 low 79%.   Please let her know her sleep study shows moderate obstructive sleep apnea.  Please forward sleep study to her DME and arrange for new auto CPAP range 5 to 15 cm H2O with heated humidity.  She needs ROV with me or NP 2 months after getting new CPAP.

## 2016-12-07 NOTE — Patient Instructions (Addendum)
  Ms.Shailah C Auer I have seen you today for an acute visit.  A few things to remember from today's visit:   Frequency of urination - Plan: POCT urinalysis dipstick, Urine culture  Gross hematuria - Plan: Urine culture  Urinary tract infection with hematuria, site unspecified - Plan: sulfamethoxazole-trimethoprim (BACTRIM DS,SEPTRA DS) 800-160 MG tablet, Urine culture  Bilateral low back pain without sciatica, unspecified chronicity  DM (diabetes mellitus), type 2 with peripheral vascular complications (HCC)  Weight loss, non-intentional    Some of your symptoms could be related to not well controlled diabetes.    Adequate fluid intake, avoid holding urine for long hours, and over the counter Vit C OR cranberry capsules might help.  Today we will treat empirically with antibiotic, which we might need to change when urine culture comes back depending of bacteria susceptibility.  Seek immediate medical attention if severe abdominal pain, vomiting, fever/chills, or worsening symptoms.  In general please monitor for signs of worsening symptoms and seek immediate medical attention if any concerning.  Back pain can be muscle related, Tylenol 500 mg 3 times per day or icy hot on area may help.   I hope you get better soon!

## 2016-12-08 ENCOUNTER — Encounter: Payer: Self-pay | Admitting: Family Medicine

## 2016-12-08 LAB — URINE CULTURE
MICRO NUMBER: 81308651
SPECIMEN QUALITY: ADEQUATE

## 2016-12-08 NOTE — Telephone Encounter (Signed)
Called and spoke with patient today regarding results per vs.  Informed the patient of her results today and recommendations per vs. The patient verbalized understanding and denied any questions or concerns at this time. Placed order today for new cpap machine, new mask, humidifier, and supplies. Pt will need to be enrolled into [facility]. Nothing further needed.

## 2017-01-14 ENCOUNTER — Other Ambulatory Visit: Payer: Self-pay | Admitting: Internal Medicine

## 2017-01-18 ENCOUNTER — Other Ambulatory Visit: Payer: Self-pay | Admitting: Internal Medicine

## 2017-02-09 ENCOUNTER — Other Ambulatory Visit: Payer: Self-pay | Admitting: Internal Medicine

## 2017-02-14 NOTE — Patient Instructions (Addendum)
  Test(s) ordered today. Your results will be released to [facility] (or called to you) after review, usually within 72hours after test completion. If any changes need to be made, you will be notified at that same time.  Medications reviewed and updated.  Changes include increasing Cymbalta to 60 mg daily.  We need to restart the trulicity at 9.49 mg weekly and if you do well with this we can increase it after one month to 1.5 mg weekly.   Your prescription(s) have been submitted to your pharmacy. Please take as directed and contact our office if you believe you are having problem(s) with the medication(s).  Please followup in 3 months

## 2017-02-14 NOTE — Progress Notes (Signed)
Subjective:    Patient ID: Alexis Grant, female    DOB: Feb 04, 1950, 67 y.o.   MRN: [id number]  HPI The patient is here for follow up.  Diabetes: She is taking her medication daily as prescribed, but was under the impression after our last visit that she was to stop the trulicity and has not been taking it. She is compliant with a diabetic diet. She is not exercising regularly. She monitors her sugars and they have been running 204 yesterday. She is up-to-date with an ophthalmology examination.   Hypertension: She is taking her medication daily. She is compliant with a low sodium diet.  She denies chest pain, palpitations, edema, shortness of breath and regular headaches. She is exercising regularly.     Hypothyroidism:  She is taking her medication daily.  She denies any recent changes in energy or weight that are unexplained.   GERD:  She is taking her medication daily as prescribed.  She denies any GERD symptoms and feels her GERD is well controlled.   Hyperlipidemia: She is taking her medication daily. She is compliant with a low fat/cholesterol diet. She is not exercising regularly. She denies myalgias.   Depression: She is taking her medication daily as prescribed. She denies any side effects from the medication. She feels her depression is not well controlled.  He son is having a lot of health problems.     Sleeping difficulties:  She has difficulty sleeping due to chronic hip and back pain.    Chronic hip pain and knee pain:  She has seen orthopedics and will make another appointment.  She has had both knees replaced.    Medications and allergies reviewed with patient and updated if appropriate.  Patient Active Problem List   Diagnosis Date Noted  . Osteopenia 11/22/2016  . GERD (gastroesophageal reflux disease) 11/15/2016  . Tingling of both feet 11/15/2016  . Cough 08/16/2016  . Difficulty sleeping 05/24/2016  . Rosacea 11/25/2015  . Poor balance 11/25/2015  .  Tremor 04/18/2015  . Other fatigue 02/17/2015  . Migraine with aura and without status migrainosus, not intractable 12/04/2014  . 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 Hx of adenomatous colonic polyps 07/16/2014  . Benign neoplasm of ascending colon 07/16/2014  . Benign neoplasm of descending colon 07/16/2014  . Benign neoplasm of cecum 07/16/2014  . Benign neoplasm of transverse colon 07/16/2014  . Fibromyalgia 05/30/2013  . Generalized convulsive epilepsy ([place]) 10/20/2012  . Seasonal allergies 12/08/2011  . DM (diabetes mellitus), type 2 with peripheral vascular complications ([place]) 25/36/6440  . Essential hypertension 01/05/2010  . Irritable bowel syndrome 01/05/2010  . VERTIGO 09/16/2009  . VITAMIN D DEFICIENCY 02/24/2009  . MYOCARDIAL INFARCTION, ACUTE, SUBENDOCARDIAL 10/04/2008  . Hypothyroidism 03/19/2008  . COLONIC POLYPS, ADENOMATOUS, HX OF 08/07/2007  . HEMANGIOMA 04/26/2007  . Dyslipidemia 04/26/2007  . OBESITY 04/26/2007  . Depression 04/26/2007  . SLEEP APNEA, OBSTRUCTIVE 04/26/2007  . HIATAL HERNIA 04/26/2007  . FATTY LIVER DISEASE 04/26/2007  . ARTHRITIS 04/26/2007    Current Outpatient Medications on File Prior to Visit  Medication Sig Dispense Refill  . divalproex (DEPAKOTE) 500 MG DR tablet Take 1 tablet in AM, 2 tablets in PM 90 tablet 11  . Dulaglutide (TRULICITY) 1.5 HK/7.4QV SOPN Inject 1.5 mg into the skin once a week. (Patient taking differently: Inject 1.5 mg into the skin every Tuesday. ) 4 pen 5  .  DULoxetine (CYMBALTA) 30 MG capsule TAKE 1 CAPSULE BY MOUTH ONCE DAILY 90 capsule 0  . empagliflozin (JARDIANCE) 25 MG TABS tablet Take 25 mg daily by mouth. 90 mg 1  . levothyroxine (SYNTHROID, LEVOTHROID) 75 MCG tablet Take 1 tablet (75 mcg total) daily by mouth. 90 tablet 0  . losartan (COZAAR) 50 MG tablet TAKE 1  TABLET BY MOUTH ONCE DAILY 90 tablet 1  . metFORMIN (GLUCOPHAGE) 1000 MG tablet TAKE ONE TABLET BY MOUTH ONCE DAILY WITH  BREAKFAST 90 tablet 0  . omeprazole (PRILOSEC) 40 MG capsule Take 1 capsule (40 mg total) by mouth daily. 90 capsule 1  . simvastatin (ZOCOR) 20 MG tablet TAKE 1 TABLET BY MOUTH AT BEDTIME 90 tablet 1  . topiramate (TOPAMAX) 25 MG tablet Take 3 tablets every night 90 tablet 11  . traZODone (DESYREL) 50 MG tablet Take 2 tablets (100 mg total) by mouth at bedtime. for sleep 180 tablet 1  . [DISCONTINUED] Cholecalciferol (VITAMIN D) 2000 UNITS CAPS Take by mouth.       No current facility-administered medications on file prior to visit.     Past Medical History:  Diagnosis Date  . Adenomatous polyp of colon 2008  . Allergy   . Anemia   . Anxiety   . Arthritis   . Blood transfusion without reported diagnosis    following knee surgery   . Cataract    hx- removed both eyes  . Depression   . Diabetes mellitus    type II   . Dyslipidemia   . Fibromyalgia   . Gallstones   . Gastritis   . GERD (gastroesophageal reflux disease)   . Hemangioma   . Hiatal hernia    Fibromyalgia  . Hyperlipidemia   . Hypertension   . Hypothyroidism 2010   Dr.Deveshawr  . IBS (irritable bowel syndrome)    Dr Fuller Plan  . Migraines   . Myocardial infarct ([place]) 2010   subendocardrial, following R TKR  . Obesity   . OSA (obstructive sleep apnea)    cpap - does not know settings   . Pneumonia   . Seizure disorder ([place])   . Seizures ([place])    last seizure was 2002 per pt 07-29-16  . Sleep apnea   . Urinary tract infection     Past Surgical History:  Procedure Laterality Date  . ABDOMINAL HYSTERECTOMY  1988  . CARDIAC CATHETERIZATION  2007   DrGamble ( now seeing Dr Tonita Cong cardiology)  . CHOLECYSTECTOMY  1985  . COLONOSCOPY    . COLONOSCOPY WITH PROPOFOL N/A 07/16/2014   Procedure: COLONOSCOPY WITH PROPOFOL;  Surgeon: Ladene Artist, MD;  Location: [facility];  Service:  Endoscopy;  Laterality: N/A;  . COLONOSCOPY WITH PROPOFOL N/A 09/21/2016   Procedure: COLONOSCOPY WITH PROPOFOL;  Surgeon: Ladene Artist, MD;  Location: [facility];  Service: Endoscopy;  Laterality: N/A;  . POLYPECTOMY  2012   6 or 7 adenomatous, Dr.Stark  . steroid injection to left si joint  12/2008   Dr.Newton  . TOTAL KNEE ARTHROPLASTY Right 11/2005  . TOTAL KNEE ARTHROPLASTY Left   . TUBAL LIGATION  1986    Social History   Socioeconomic History  . Marital status: Widowed    Spouse name: None  . Number of children: 3  . Years of education: college  . Highest education level: None  Social Needs  . Financial resource strain: None  . Food insecurity - worry: None  . Food insecurity - inability: None  .  Transportation needs - medical: None  . Transportation needs - non-medical: None  Occupational History  . Occupation: Retired  Tobacco Use  . Smoking status: Never Smoker  . Smokeless tobacco: Never Used  Substance and Sexual Activity  . Alcohol use: No    Alcohol/week: 0.0 oz  . Drug use: No  . Sexual activity: None  Other Topics Concern  . None  Social History Narrative   Illicit drug use- no   Patient does not get regular exercise due to knee.   Patient lives at home alone.    Patient son and wife lives next door.   Patient has 3 children.    Patient has some college.    Patient retired May 2014.     Family History  Problem Relation Age of Onset  . Colon polyps Mother   . Ulcers Father   . Colon polyps Brother   . Diabetes Sister   . Diabetes Paternal Aunt   . Esophageal cancer Brother   . Kidney cancer Sister   . Colon cancer Neg Hx   . Heart attack Neg Hx   . Stroke Neg Hx   . Cancer Neg Hx   . Gallbladder disease Neg Hx   . Rectal cancer Neg Hx   . Stomach cancer Neg Hx     Review of Systems  Constitutional: Positive for appetite change (decreased) and fatigue. Negative for chills and fever.  Respiratory: Positive for cough (first thing in  the morning only). Negative for shortness of breath and wheezing.   Cardiovascular: Negative for chest pain, palpitations and leg swelling.  Endocrine: Positive for cold intolerance.  Musculoskeletal: Positive for arthralgias (knees and hips) and back pain.  Neurological: Positive for dizziness and headaches (occ).  Psychiatric/Behavioral: Positive for sleep disturbance (3 hrs then wakes - pain sleeping).       Objective:   Vitals:   02/15/17 0925  BP: 130/82  Pulse: (!) 106  Resp: 18  Temp: 98 F (36.7 C)  SpO2: 98%   Wt Readings from Last 3 Encounters:  02/15/17 284 lb (128.8 kg)  12/07/16 285 lb 8 oz (129.5 kg)  11/15/16 293 lb (132.9 kg)   Body mass index is 50.31 kg/m.   Physical Exam    Constitutional: Appears well-developed and well-nourished. No distress.  HENT:  Head: Normocephalic and atraumatic.  Neck: Neck supple. No tracheal deviation present. No thyromegaly present.  No cervical lymphadenopathy Cardiovascular: Normal rate, regular rhythm and normal heart sounds.   1/6 systolic murmur heard. No carotid bruit .  No edema Pulmonary/Chest: Effort normal and breath sounds normal. No respiratory distress. No has no wheezes. No rales.  Skin: Skin is warm and dry. Not diaphoretic.  Psychiatric: Mildly depressed mood and affect. Behavior is normal.      Assessment & Plan:    See Problem List for Assessment and Plan of chronic medical problems.

## 2017-02-15 ENCOUNTER — Other Ambulatory Visit (INDEPENDENT_AMBULATORY_CARE_PROVIDER_SITE_OTHER): Payer: Medicare Other

## 2017-02-15 ENCOUNTER — Encounter: Payer: Self-pay | Admitting: Internal Medicine

## 2017-02-15 ENCOUNTER — Ambulatory Visit (INDEPENDENT_AMBULATORY_CARE_PROVIDER_SITE_OTHER): Payer: Medicare Other | Admitting: Internal Medicine

## 2017-02-15 VITALS — BP 130/82 | HR 106 | Temp 98.0°F | Resp 18 | Wt 284.0 lb

## 2017-02-15 DIAGNOSIS — E039 Hypothyroidism, unspecified: Secondary | ICD-10-CM | POA: Diagnosis not present

## 2017-02-15 DIAGNOSIS — F329 Major depressive disorder, single episode, unspecified: Secondary | ICD-10-CM

## 2017-02-15 DIAGNOSIS — F32A Depression, unspecified: Secondary | ICD-10-CM

## 2017-02-15 DIAGNOSIS — E785 Hyperlipidemia, unspecified: Secondary | ICD-10-CM

## 2017-02-15 DIAGNOSIS — G4733 Obstructive sleep apnea (adult) (pediatric): Secondary | ICD-10-CM

## 2017-02-15 DIAGNOSIS — K219 Gastro-esophageal reflux disease without esophagitis: Secondary | ICD-10-CM

## 2017-02-15 DIAGNOSIS — I10 Essential (primary) hypertension: Secondary | ICD-10-CM

## 2017-02-15 DIAGNOSIS — G479 Sleep disorder, unspecified: Secondary | ICD-10-CM | POA: Diagnosis not present

## 2017-02-15 DIAGNOSIS — E1151 Type 2 diabetes mellitus with diabetic peripheral angiopathy without gangrene: Secondary | ICD-10-CM

## 2017-02-15 LAB — COMPREHENSIVE METABOLIC PANEL
ALT: 11 U/L (ref 0–35)
AST: 9 U/L (ref 0–37)
Albumin: 4 g/dL (ref 3.5–5.2)
Alkaline Phosphatase: 59 U/L (ref 39–117)
BUN: 18 mg/dL (ref 6–23)
CHLORIDE: 103 meq/L (ref 96–112)
CO2: 25 meq/L (ref 19–32)
CREATININE: 0.73 mg/dL (ref 0.40–1.20)
Calcium: 9.3 mg/dL (ref 8.4–10.5)
GFR: 84.72 mL/min (ref >60.00)
Glucose, Bld: 218 mg/dL — ABNORMAL HIGH (ref 70–99)
Potassium: 4 mEq/L (ref 3.5–5.1)
SODIUM: 139 meq/L (ref 135–145)
Total Bilirubin: 0.5 mg/dL (ref 0.2–1.2)
Total Protein: 7 g/dL (ref 6.0–8.3)

## 2017-02-15 LAB — CBC WITH DIFFERENTIAL/PLATELET
BASOS ABS: 0.1 10*3/uL (ref 0.0–0.1)
Basophils Relative: 0.9 % (ref 0.0–3.0)
EOS ABS: 0.2 10*3/uL (ref 0.0–0.7)
Eosinophils Relative: 2.2 % (ref 0.0–5.0)
HCT: 44.8 % (ref 36.0–46.0)
Hemoglobin: 14.9 g/dL (ref 12.0–15.0)
LYMPHS ABS: 3.5 10*3/uL (ref 0.7–4.0)
LYMPHS PCT: 43.7 % (ref 12.0–46.0)
MCHC: 33.4 g/dL (ref 30.0–36.0)
MCV: 86.3 fl (ref 78.0–100.0)
Monocytes Absolute: 0.6 10*3/uL (ref 0.1–1.0)
Monocytes Relative: 7.7 % (ref 3.0–12.0)
NEUTROS ABS: 3.6 10*3/uL (ref 1.4–7.7)
NEUTROS PCT: 45.5 % (ref 43.0–77.0)
PLATELETS: 233 10*3/uL (ref 150.0–400.0)
RBC: 5.19 Mil/uL — ABNORMAL HIGH (ref 3.87–5.11)
RDW: 15 % (ref 11.5–15.5)
WBC: 8 10*3/uL (ref 4.0–10.5)

## 2017-02-15 LAB — LIPID PANEL
CHOL/HDL RATIO: 3
Cholesterol: 170 mg/dL (ref 0–200)
HDL: 55.1 mg/dL (ref >39.00)
LDL Cholesterol: 92 mg/dL (ref 0–99)
NONHDL: 115.32
TRIGLYCERIDES: 117 mg/dL (ref 0.0–149.0)
VLDL: 23.4 mg/dL (ref 0.0–40.0)

## 2017-02-15 LAB — TSH: TSH: 3.16 u[IU]/mL (ref 0.35–4.50)

## 2017-02-15 LAB — HEMOGLOBIN A1C: Hgb A1c MFr Bld: 8.9 % — ABNORMAL HIGH (ref 4.6–6.5)

## 2017-02-15 MED ORDER — DULAGLUTIDE 0.75 MG/0.5ML ~~LOC~~ SOAJ: 0.7500 mg | SUBCUTANEOUS | 5 refills | Status: DC

## 2017-02-15 MED ORDER — DULOXETINE HCL 60 MG PO CPEP: 60.0000 mg | ORAL_CAPSULE | Freq: Every day | ORAL | 1 refills | Status: DC

## 2017-02-15 NOTE — Assessment & Plan Note (Signed)
Check tsh  Titrate med dose if needed  

## 2017-02-15 NOTE — Assessment & Plan Note (Signed)
Related mostly to hip pain - will see orthopedics again Sleep apnea may also be contributing and she will go for a fitting for a new CPAP machine Continue trazodone

## 2017-02-15 NOTE — Assessment & Plan Note (Signed)
Check lipid panel  Continue daily statin Regular exercise and healthy diet encouraged  

## 2017-02-15 NOTE — Assessment & Plan Note (Signed)
GERD controlled Continue daily medication  

## 2017-02-15 NOTE — Assessment & Plan Note (Signed)
Not controlled Stopped trulicity and should have continued it Restart trulicity 8.25 mg weekly - increase to 1.5 mg after one month if not too expensive Continue jardiance and metformin at current doses Check a1c

## 2017-02-15 NOTE — Assessment & Plan Note (Addendum)
Not using cpap Was supposed to get fitted for new machine last month - it was cancelled due to snow Trying to get [facility] to arrange fitting This is likely contributing to her fatigue

## 2017-02-15 NOTE — Assessment & Plan Note (Signed)
Not ideally controlled We will try increasing Cymbalta to 60 mg daily

## 2017-02-15 NOTE — Assessment & Plan Note (Signed)
BP well controlled Current regimen effective and well tolerated Continue current medications at current doses cmp  

## 2017-02-16 NOTE — Telephone Encounter (Signed)
error 

## 2017-02-17 ENCOUNTER — Encounter: Payer: Self-pay | Admitting: Internal Medicine

## 2017-02-25 ENCOUNTER — Encounter (INDEPENDENT_AMBULATORY_CARE_PROVIDER_SITE_OTHER): Payer: Self-pay | Admitting: Orthopaedic Surgery

## 2017-02-25 ENCOUNTER — Ambulatory Visit (INDEPENDENT_AMBULATORY_CARE_PROVIDER_SITE_OTHER): Payer: Medicare Other

## 2017-02-25 ENCOUNTER — Ambulatory Visit (INDEPENDENT_AMBULATORY_CARE_PROVIDER_SITE_OTHER): Payer: Medicare Other | Admitting: Orthopaedic Surgery

## 2017-02-25 VITALS — Resp 18 | Ht 63.0 in | Wt 286.0 lb

## 2017-02-25 DIAGNOSIS — M25571 Pain in right ankle and joints of right foot: Secondary | ICD-10-CM

## 2017-02-25 DIAGNOSIS — M25531 Pain in right wrist: Secondary | ICD-10-CM | POA: Diagnosis not present

## 2017-02-25 NOTE — Progress Notes (Signed)
Office Visit Note   Patient: Alexis Grant           Date of Birth: 05-10-1950           MRN: [id number] Visit Date: 02/25/2017              Requested by: Binnie Rail, MD [place], [place] [address] PCP: Binnie Rail, MD   Assessment & Plan: Visit Diagnoses:  1. Pain in right wrist   2. Pain in right ankle and joints of right foot     Plan: Soft tissue injury/sprain right wrist. We will apply a volar wrist splint. Small avulsion fracture at the tip of the lateral malleolus right ankle. We will try an Manufacturing systems engineer. Office 2-3 weeks for follow-up. Patient much more comfortable in the 2 devices  Follow-Up Instructions: Return in about 3 weeks (around 03/18/2017).   Orders:  Orders Placed This Encounter  Procedures  . XR Wrist Complete Right  . XR Ankle Complete Right   No orders of the defined types were placed in this encounter.     Procedures: No procedures performed   Clinical Data: No additional findings.   Subjective: Chief Complaint  Patient presents with  . Right Ankle - Injury, Pain, Edema    Alexis Grant is a 67 y o that presents with Right ankle and R wrist pain due to a fall face first when her ankle rolled off sidewalk. Pain, swelling, diabetic, ambulates with a cane  . Right Wrist - Injury, Pain, Edema  Alexis Grant is seen on an emergent basis after she fell today. She rolled off a sidewalk with an inversion stress of her right ankle breaking her fall with an outstretched right upper extremity. She bruised her chin and is been complaining of pain in her right wrist and right ankle. She is able to bear some weight  HPI  Review of Systems     Objective: Vital Signs: Resp 18   Ht 5\' 3"  (1.6 m)   Wt 286 lb (129.7 kg)   BMI 50.66 kg/m   Physical Exam  Ortho Exam awake alert and oriented 3. Comfortable sitting. Small abrasion on her chin and that's not bleeding. Mild tenderness along the dorsum of the wrist the radiocarpal joint I  right wrist. Skin intact. Neurovascular exam intact. No deformity. No crepitation. Full range of motion of fingers neurovascular exam intact. No specific tenderness over the distal radius or ulna. Right ankle with some local swelling along the lateral ankle. No obvious deformity. Able to bear weight. No obvious instability with an inversion and eversion stress. Negative anterior drawer sign. No obvious ecchymosis or erythema. Capillary refill to toes. Sensory exam intact  Specialty Comments:  No specialty comments available.  Imaging: Xr Wrist Complete Right  Result Date: 02/25/2017 Films of the wrist were obtained in several projections. There is arthritis at the base of the thumb but no acute injuries at the distal radius or the ulna. No ectopic calcification  Xr Ankle Complete Right  Result Date: 02/25/2017 Films of the right ankle obtained in several projections. There is a small avulsion fracture at the very tip of the lateral malleolus was some soft tissue edema. Ankle mortise is intact. No abnormality about the medial malleolus.    PMFS History: Patient Active Problem List   Diagnosis Date Noted  . Osteopenia 11/22/2016  . GERD (gastroesophageal reflux disease) 11/15/2016  . Tingling of both feet 11/15/2016  . Cough 08/16/2016  .  Difficulty sleeping 05/24/2016  . Rosacea 11/25/2015  . Poor balance 11/25/2015  . Tremor 04/18/2015  . Other fatigue 02/17/2015  . Migraine with aura and without status migrainosus, not intractable 12/04/2014  . Localization-related (focal) (partial) idiopathic epilepsy and epileptic syndromes with seizures of localized onset, not intractable, without status epilepticus ([place]) 12/04/2014  . Hx of adenomatous colonic polyps 07/16/2014  . Benign neoplasm of ascending colon 07/16/2014  . Benign neoplasm of descending colon 07/16/2014  . Benign neoplasm of cecum 07/16/2014  . Benign neoplasm of transverse colon 07/16/2014  . Fibromyalgia 05/30/2013  .  Generalized convulsive epilepsy ([place]) 10/20/2012  . Seasonal allergies 12/08/2011  . DM (diabetes mellitus), type 2 with peripheral vascular complications ([place]) 10/93/2355  . Essential hypertension 01/05/2010  . Irritable bowel syndrome 01/05/2010  . VERTIGO 09/16/2009  . VITAMIN D DEFICIENCY 02/24/2009  . MYOCARDIAL INFARCTION, ACUTE, SUBENDOCARDIAL 10/04/2008  . Hypothyroidism 03/19/2008  . COLONIC POLYPS, ADENOMATOUS, HX OF 08/07/2007  . HEMANGIOMA 04/26/2007  . Dyslipidemia 04/26/2007  . OBESITY 04/26/2007  . Depression 04/26/2007  . SLEEP APNEA, OBSTRUCTIVE 04/26/2007  . HIATAL HERNIA 04/26/2007  . FATTY LIVER DISEASE 04/26/2007  . ARTHRITIS 04/26/2007   Past Medical History:  Diagnosis Date  . Adenomatous polyp of colon 2008  . Allergy   . Anemia   . Anxiety   . Arthritis   . Blood transfusion without reported diagnosis    following knee surgery   . Cataract    hx- removed both eyes  . Depression   . Diabetes mellitus    type II   . Dyslipidemia   . Fibromyalgia   . Gallstones   . Gastritis   . GERD (gastroesophageal reflux disease)   . Hemangioma   . Hiatal hernia    Fibromyalgia  . Hyperlipidemia   . Hypertension   . Hypothyroidism 2010   Dr.Deveshawr  . IBS (irritable bowel syndrome)    Dr Fuller Plan  . Migraines   . Myocardial infarct ([place]) 2010   subendocardrial, following R TKR  . Obesity   . OSA (obstructive sleep apnea)    cpap - does not know settings   . Pneumonia   . Seizure disorder ([place])   . Seizures ([place])    last seizure was 2002 per pt 07-29-16  . Sleep apnea   . Urinary tract infection     Family History  Problem Relation Age of Onset  . Colon polyps Mother   . Ulcers Father   . Colon polyps Brother   . Diabetes Sister   . Diabetes Paternal Aunt   . Esophageal cancer Brother   . Kidney cancer Sister   . Colon cancer Neg Hx   . Heart attack Neg Hx   . Stroke Neg Hx   . Cancer Neg Hx   . Gallbladder disease Neg Hx   . Rectal  cancer Neg Hx   . Stomach cancer Neg Hx     Past Surgical History:  Procedure Laterality Date  . ABDOMINAL HYSTERECTOMY  1988  . CARDIAC CATHETERIZATION  2007   DrGamble ( now seeing Dr Tonita Cong cardiology)  . CHOLECYSTECTOMY  1985  . COLONOSCOPY    . COLONOSCOPY WITH PROPOFOL N/A 07/16/2014   Procedure: COLONOSCOPY WITH PROPOFOL;  Surgeon: Ladene Artist, MD;  [facility];  Service: Endoscopy;  Laterality: N/A;  . COLONOSCOPY WITH PROPOFOL N/A 09/21/2016   Procedure: COLONOSCOPY WITH PROPOFOL;  Surgeon: Ladene Artist, MD;  Location[facility];  Service: Endoscopy;  Laterality: N/A;  .  POLYPECTOMY  2012   6 or 7 adenomatous, Dr.Stark  . steroid injection to left si joint  12/2008   Dr.Newton  . TOTAL KNEE ARTHROPLASTY Right 11/2005  . TOTAL KNEE ARTHROPLASTY Left   . TUBAL LIGATION  1986   Social History   Occupational History  . Occupation: Retired  Tobacco Use  . Smoking status: Never Smoker  . Smokeless tobacco: Never Used  Substance and Sexual Activity  . Alcohol use: No    Alcohol/week: 0.0 oz  . Drug use: No  . Sexual activity: Not on file

## 2017-03-03 ENCOUNTER — Other Ambulatory Visit: Payer: Self-pay | Admitting: Internal Medicine

## 2017-03-04 ENCOUNTER — Ambulatory Visit: Payer: Medicare Other | Admitting: Neurology

## 2017-03-28 DIAGNOSIS — H40013 Open angle with borderline findings, low risk, bilateral: Secondary | ICD-10-CM | POA: Diagnosis not present

## 2017-03-28 DIAGNOSIS — H26493 Other secondary cataract, bilateral: Secondary | ICD-10-CM | POA: Diagnosis not present

## 2017-03-28 DIAGNOSIS — E1165 Type 2 diabetes mellitus with hyperglycemia: Secondary | ICD-10-CM | POA: Diagnosis not present

## 2017-03-28 DIAGNOSIS — Z961 Presence of intraocular lens: Secondary | ICD-10-CM | POA: Diagnosis not present

## 2017-03-28 LAB — HM DIABETES EYE EXAM

## 2017-03-29 ENCOUNTER — Encounter: Payer: Self-pay | Admitting: Internal Medicine

## 2017-04-06 ENCOUNTER — Telehealth: Payer: Self-pay | Admitting: Neurology

## 2017-04-06 NOTE — Telephone Encounter (Signed)
Patient state that she has been really dizzy and falling a lot here lately please call

## 2017-04-06 NOTE — Telephone Encounter (Signed)
Spoke with pt.  She states that she has been falling quite a lot lately, more so over the past few months.  6 weeks ago she fell and fractured her ankle, a couple of weeks ago she fell in the shower laid there for 3 hours before her grandson found her.  She states that the other day she went to stand up from a sitted position and it felt as though she didn't have feet.  States that she started to fall, however she caught herself on a table and was able to sit back down.  Pt states that the falls are starting to scare her.  Please advise.

## 2017-04-07 NOTE — Telephone Encounter (Signed)
Spoke with pt relaying message below.  She is OK coming tomorrow.  Will schedule.

## 2017-04-07 NOTE — Telephone Encounter (Signed)
These are new symptoms, can she come tomorrow at noon? If symptoms worsen before then, go to ER. Thanks

## 2017-04-08 ENCOUNTER — Other Ambulatory Visit: Payer: Self-pay

## 2017-04-08 ENCOUNTER — Ambulatory Visit (INDEPENDENT_AMBULATORY_CARE_PROVIDER_SITE_OTHER): Payer: Medicare Other | Admitting: Neurology

## 2017-04-08 VITALS — BP 126/64 | HR 86 | Ht 63.0 in | Wt 282.0 lb

## 2017-04-08 DIAGNOSIS — R2681 Unsteadiness on feet: Secondary | ICD-10-CM

## 2017-04-08 DIAGNOSIS — R296 Repeated falls: Secondary | ICD-10-CM | POA: Diagnosis not present

## 2017-04-08 NOTE — Patient Instructions (Addendum)
1. Schedule MRI brain with and without contrast 2. Schedule EMG/NCV of both lower extremities 3. Refer for Balance Therapy 4. Use cane at all times 5. Continue all your current medications 6. Follow-up as scheduled in August, our office will call with results and if there is need for earlier visit

## 2017-04-08 NOTE — Progress Notes (Signed)
NEUROLOGY FOLLOW UP OFFICE NOTE  Alexis Grant [id number]  DOB: [birth year]  HISTORY OF PRESENT ILLNESS: I had the pleasure of seeing Alexis Grant in follow-up in the neurology clinic on 04/08/2017.  The patient was last seen 7 months ago for seizures and headaches. She has not had any seizures since 2002. On her initial visit in November 2016, she reported frequent migraines. Increase in Depakote dose caused more tremors, no change in migraines. She was reporting migraines once a week. Topamax 75mg  qhs was added, with some improvement in symptoms. She presents for an earlier visit today due to an increase in frequency of falls. Over the past 2 months, she started having tingling in both feet with note of balance/gait instability. She was also having a lot of left-sided sharp pains in her back. She has had bladder incontinence for the past 4-5 months. She denies any spinning sensation. She was walking out of the doctor's office and had to wait 30 seconds, then she was fine after with no balance issues at all. No associated headache. She denies any seizures. She fell 9-10 seeks ago walking in the house, she just fell and pulled herself up using the rails. A week after, she fell in the shower getting out of the tub, she was alone and lay in the tub for 3 hours because she could not get herself up due to knee issues. She fell in her driveway 5 weeks ago and fractured her right ankle, injuring her wrist and face as well. Last fall was 3 weeks ago, she was in a restaurant and got up, it felt like she did not have feet under her and "put my face on a guy's plate." She denied any loss of consciousness. She continues to have tremors, she takes Depakote 500mg  in AM, 1000mg  in PM and Topamax 50mg  qhs. She feels shaky inside all the time.   HPI 11/28/2014: This is a pleasant 67 yo RH woman with a history of hypertension, hyperlipidemia, diabetes, obesity, sleep apnea, CAD s/p MI, with seizures and migraines.  She reports seizures started at age 67, she woke up in the hospital with no prior warning symptoms. Since then, she has had 4 generalized tonic-clonic seizures, last was in 2002. She also report 5 or 6 "little episodes" where she feels dizzy, with blurred vision, then if she sits down quickly and tries to relax, she can avoid any progression. She would feel briefly confused. No associated olfactory/gustatory hallucinations, focal numbness/tingling/weakness, myoclonic jerks. The last time she had these episodes was at least 12 years ago. She recalls taking Neurontin initially, which did not help. Topamax in the past which caused GI symptoms. She has been taking Depakote ER 500mg  in AM, 1000mg  in PM for many years now with no side effects. She reports having brain imaging and EEG in the past which were normal, no records available for review.   She reports migraines started at age 67. She has an aura of numbness in her face, cheeks, and temple, then she becomes very sensitive to lights, sounds, and smells. She sees flashing lights then her vision becomes blurred, followed by throbbing headaches in the frontal or occipital regions with associated nausea. No associated focal numbness/tingling/weakness in the extremities. She tried Imitrex with minimal effect. A course of Prednisone   She has chronic neck pain and has been dealing with diarrhea from IBS. She had an MI in 2010.   Epilepsy Risk Factors:  There is a strong family history  of seizures in her paternal grandfather, paternal aunt, nephew. Otherwise she had a normal birth and early development.  There is no history of febrile convulsions, CNS infections such as meningitis/encephalitis, significant traumatic brain injury, neurosurgical procedures.  PAST MEDICAL HISTORY: Past Medical History:  Diagnosis Date  . Adenomatous polyp of colon 2008  . Allergy   . Anemia   . Anxiety   . Arthritis   . Blood transfusion without reported diagnosis     following knee surgery   . Cataract    hx- removed both eyes  . Depression   . Diabetes mellitus    type II   . Dyslipidemia   . Fibromyalgia   . Gallstones   . Gastritis   . GERD (gastroesophageal reflux disease)   . Hemangioma   . Hiatal hernia    Fibromyalgia  . Hyperlipidemia   . Hypertension   . Hypothyroidism 2010   Dr.[place]  . IBS (irritable bowel syndrome)    Dr Fuller Plan  . Migraines   . Myocardial infarct ([place]) 2010   subendocardrial, following R TKR  . Obesity   . OSA (obstructive sleep apnea)    cpap - does not know settings   . Pneumonia   . Seizure disorder ([place])   . Seizures ([place])    last seizure was 2002 per pt 07-29-16  . Sleep apnea   . Urinary tract infection     MEDICATIONS: Current Outpatient Medications on File Prior to Visit  Medication Sig Dispense Refill  . divalproex (DEPAKOTE) 500 MG DR tablet Take 1 tablet in AM, 2 tablets in PM 90 tablet 11  . Dulaglutide (TRULICITY) 1.61 WR/6.0AV SOPN Inject 0.75 mg into the skin once a week. 4 pen 5  . DULoxetine (CYMBALTA) 60 MG capsule Take 1 capsule (60 mg total) by mouth daily. 90 capsule 1  . empagliflozin (JARDIANCE) 25 MG TABS tablet Take 25 mg daily by mouth. 90 mg 1  . levothyroxine (SYNTHROID, LEVOTHROID) 75 MCG tablet Take 1 tablet (75 mcg total) daily by mouth. 90 tablet 0  . losartan (COZAAR) 50 MG tablet TAKE 1 TABLET BY MOUTH ONCE DAILY 90 tablet 1  . metFORMIN (GLUCOPHAGE) 1000 MG tablet TAKE 1 TABLET BY MOUTH ONCE DAILY WITH BREAKFAST 90 tablet 1  . omeprazole (PRILOSEC) 40 MG capsule Take 1 capsule (40 mg total) by mouth daily. 90 capsule 1  . simvastatin (ZOCOR) 20 MG tablet TAKE 1 TABLET BY MOUTH AT BEDTIME 90 tablet 1  . topiramate (TOPAMAX) 25 MG tablet Take 3 tablets every night 90 tablet 11  . traZODone (DESYREL) 50 MG tablet Take 2 tablets (100 mg total) by mouth at bedtime. for sleep 180 tablet 1  . [DISCONTINUED] Cholecalciferol (VITAMIN D) 2000 UNITS CAPS Take by mouth.        No current facility-administered medications on file prior to visit.     ALLERGIES: Allergies  Allergen Reactions  . Hydrocodone-Acetaminophen Itching    Itches all over; without rash per pt    FAMILY HISTORY: Family History  Problem Relation Age of Onset  . Colon polyps Mother   . Ulcers Father   . Colon polyps Brother   . Diabetes Sister   . Diabetes Paternal Aunt   . Esophageal cancer Brother   . Kidney cancer Sister   . Colon cancer Neg Hx   . Heart attack Neg Hx   . Stroke Neg Hx   . Cancer Neg Hx   . Gallbladder disease Neg Hx   .  Rectal cancer Neg Hx   . Stomach cancer Neg Hx     SOCIAL HISTORY: Social History   Socioeconomic History  . Marital status: Widowed    Spouse name: Not on file  . Number of children: 3  . Years of education: college  . Highest education level: Not on file  Occupational History  . Occupation: Retired  Scientific laboratory technician  . Financial resource strain: Not on file  . Food insecurity:    Worry: Not on file    Inability: Not on file  . Transportation needs:    Medical: Not on file    Non-medical: Not on file  Tobacco Use  . Smoking status: Never Smoker  . Smokeless tobacco: Never Used  Substance and Sexual Activity  . Alcohol use: No    Alcohol/week: 0.0 oz  . Drug use: No  . Sexual activity: Not on file  Lifestyle  . Physical activity:    Days per week: Not on file    Minutes per session: Not on file  . Stress: Not on file  Relationships  . Social connections:    Talks on phone: Not on file    Gets together: Not on file    Attends religious service: Not on file    Active member of club or organization: Not on file    Attends meetings of clubs or organizations: Not on file    Relationship status: Not on file  . Intimate partner violence:    Fear of current or ex partner: Not on file    Emotionally abused: Not on file    Physically abused: Not on file    Forced sexual activity: Not on file  Other Topics Concern  . Not  on file  Social History Narrative   Illicit drug use- no   Patient does not get regular exercise due to knee.   Patient lives at home alone.    Patient son and wife lives next door.   Patient has 3 children.    Patient has some college.    Patient retired May 2014.     REVIEW OF SYSTEMS: Constitutional: No fevers, chills, or sweats, no generalized fatigue, change in appetite Eyes: No visual changes, double vision, eye pain Ear, nose and throat: No hearing loss, ear pain, nasal congestion, sore throat Cardiovascular: No chest pain, palpitations Respiratory:  No shortness of breath at rest or with exertion, wheezes GastrointestinaI: No nausea, vomiting, diarrhea, abdominal pain, fecal incontinence Genitourinary:  No dysuria, urinary retention or frequency Musculoskeletal:  + neck pain,no back pain Integumentary: No rash, pruritus, skin lesions Neurological: as above Psychiatric: No depression, insomnia, anxiety Endocrine: No palpitations, fatigue, diaphoresis, mood swings, change in appetite, change in weight, increased thirst Hematologic/Lymphatic:  No anemia, purpura, petechiae. Allergic/Immunologic: no itchy/runny eyes, nasal congestion, recent allergic reactions, rashes  PHYSICAL EXAM: Vitals:   04/08/17 1210  BP: 126/64  Pulse: 86  SpO2: 96%   General: No acute distress Head:  Normocephalic/atraumatic Neck: supple, no paraspinal tenderness, full range of motion Heart:  Regular rate and rhythm Lungs:  Clear to auscultation bilaterally Back: No paraspinal tenderness Skin/Extremities: No rash, no edema Neurological Exam: alert and oriented to person, place, and time. No aphasia or dysarthria. Fund of knowledge is appropriate.  Recent and remote memory are intact.  Attention and concentration are normal.    Able to name objects and repeat phrases. Cranial nerves: Pupils equal, round, reactive to light.  Extraocular movements intact with no nystagmus. Visual fields full. Facial  sensation intact. No facial asymmetry. Tongue, uvula, palate midline.  Motor: Bulk and tone normal, mild cogwheeling R>L,muscle strength 5/5 throughout with no pronator drift.  Sensation decreased pin and vibration to ankles bilaterally.  No extinction to double simultaneous stimulation.  Deep tendon reflexes 2+ throughout, toes downgoing.  Finger to nose testing intact.  Gait slow and cautious favoring right ankle due to pain, no ataxia, unable to tandem walk.  Romberg negative. No resting tremor. Mild hand tremor, R>L with ambulation, decreased arm swing on right. No clear resting tremor, +bilateral postural > action tremor. Negative pull test. No micrographia (see attached).  IMPRESSION: This is a pleasant 67 yo RH woman with a history of hypertension, hyperlipidemia, obesity, sleep apnea on CPAP, CAD s/p MI, with a history of convulsive seizures, well-controlled on Depakote with no seizures since 2002. She has had migraines since her teenage years. Higher dose of Depakote caused tremors. Topamax was added for migraines and essential tremor. She is presenting with new symptoms of increased fall frequency, she has had several over the past 10 weeks. Etiology unclear, she has new exam findings concerning for parkinsonism, as well as neuropathy. She will be scheduled for an MRI brain with and without contrast to assess for underlying structural abnormality. EMG/NCV of both lower extremities will be ordered to further evaluate her symptoms. She was advised to use her cane at all times and referred for Balance Therapy. Continue current medications. She will follow-up as scheduled in August and know to call for any changes.   Thank you for allowing me to participate in her care.  Please do not hesitate to call for any questions or concerns.  The duration of this appointment visit was 25 minutes of face-to-face time with the patient.  Greater than 50% of this time was spent in counseling, explanation of  diagnosis, planning of further management, and coordination of care.   Ellouise Newer, M.D.   CC: Dr. Quay Burow

## 2017-04-15 ENCOUNTER — Other Ambulatory Visit: Payer: Self-pay

## 2017-04-15 DIAGNOSIS — R2689 Other abnormalities of gait and mobility: Secondary | ICD-10-CM

## 2017-04-17 ENCOUNTER — Ambulatory Visit
Admission: RE | Admit: 2017-04-17 | Discharge: 2017-04-17 | Disposition: A | Discharge status: Home / Self Care | Payer: Medicare Other | Source: Ambulatory Visit | Attending: Neurology | Admitting: Neurology

## 2017-04-17 DIAGNOSIS — R296 Repeated falls: Secondary | ICD-10-CM

## 2017-04-17 DIAGNOSIS — R29818 Other symptoms and signs involving the nervous system: Secondary | ICD-10-CM | POA: Diagnosis not present

## 2017-04-17 DIAGNOSIS — R2681 Unsteadiness on feet: Secondary | ICD-10-CM

## 2017-04-17 MED ORDER — GADOBENATE DIMEGLUMINE 529 MG/ML IV SOLN
20.0000 mL | Freq: Once | INTRAVENOUS | Status: AC | PRN
  Administered 2017-04-17: 20 mL via INTRAVENOUS

## 2017-04-18 ENCOUNTER — Encounter: Payer: Self-pay | Admitting: Neurology

## 2017-04-19 ENCOUNTER — Telehealth: Payer: Self-pay

## 2017-04-19 ENCOUNTER — Other Ambulatory Visit: Payer: Self-pay

## 2017-04-19 DIAGNOSIS — R2689 Other abnormalities of gait and mobility: Secondary | ICD-10-CM

## 2017-04-19 DIAGNOSIS — R42 Dizziness and giddiness: Secondary | ICD-10-CM

## 2017-04-19 DIAGNOSIS — R202 Paresthesia of skin: Secondary | ICD-10-CM

## 2017-04-19 NOTE — Telephone Encounter (Signed)
LMOM relaying message below.  

## 2017-04-19 NOTE — Telephone Encounter (Signed)
-----   Message from Cameron Sprang, MD sent at 04/18/2017  9:37 AM EDT ----- Pls let her know MRI brain looks good, no evidence of stroke, tumor, or bleed. Proceed with nerve test and go ahead with Balance therapy. Thanks

## 2017-04-21 ENCOUNTER — Encounter: Payer: Medicare Other | Admitting: Neurology

## 2017-04-25 ENCOUNTER — Other Ambulatory Visit: Payer: Self-pay | Admitting: Internal Medicine

## 2017-05-03 ENCOUNTER — Encounter: Payer: Medicare Other | Admitting: Neurology

## 2017-05-13 ENCOUNTER — Ambulatory Visit: Payer: Medicare Other | Admitting: Pulmonary Disease

## 2017-05-16 ENCOUNTER — Ambulatory Visit: Payer: Medicare Other | Admitting: Internal Medicine

## 2017-05-17 ENCOUNTER — Ambulatory Visit: Payer: Medicare Other | Attending: Neurology

## 2017-05-18 ENCOUNTER — Encounter: Payer: Self-pay | Admitting: Pulmonary Disease

## 2017-05-18 ENCOUNTER — Ambulatory Visit (INDEPENDENT_AMBULATORY_CARE_PROVIDER_SITE_OTHER): Payer: Medicare Other | Admitting: Pulmonary Disease

## 2017-05-18 VITALS — BP 110/78 | HR 101 | Ht 63.0 in | Wt 282.0 lb

## 2017-05-18 DIAGNOSIS — Z9989 Dependence on other enabling machines and devices: Secondary | ICD-10-CM

## 2017-05-18 DIAGNOSIS — G4733 Obstructive sleep apnea (adult) (pediatric): Secondary | ICD-10-CM

## 2017-05-18 NOTE — Patient Instructions (Signed)
Follow up in 1 year.

## 2017-05-18 NOTE — Progress Notes (Signed)
[facility]  Chief Complaint  Patient presents with  . Follow-up    Fit is good , just having trouble sleeping and staying alseep.    Vital signs: BP 110/78 (BP Location: Left Arm, Cuff Size: Normal)   Pulse (!) 101   Ht 5\' 3"  (1.6 m)   Wt 282 lb (127.9 kg)   SpO2 95%   BMI 49.95 kg/m   History of Present Illness: Alexis Grant is a 67 y.o. female with obstructive sleep apnea.  Her home sleep study showed moderate sleep apnea.  She was started on CPAP.  Uses nasal mask.  This has helped.  No issue with mask fit, sinus congestion, or mouth dryness.  Her main issue with sleep is related to hip and back pain.  Hard to find comfortable position.  As a result she only gets about 5 to 6 hrs sleep.   Physical Exam:  General - pleasant Eyes - pupils reactive ENT - no sinus tenderness, no oral exudate, no LAN Cardiac - regular, no murmur Chest - no wheeze, rales Abd - soft, non tender Ext - no edema Skin - no rashes Neuro - normal strength Psych - normal mood   Assessment/Plan:  Obstructive sleep apnea. - she is compliant with CPAP and report benefit - continue auto CPAP  Obesity. - discussed importance of weight loss  Hip pain. - advised her to f/u with her PCP   Patient Instructions  Follow up in 1 year    Chesley Mires, MD [place] 05/18/2017, 11:36 AM  Flow Sheet  Sleep tests: HST 12/03/16 >> AHI 24.4, SaO2 low 79%. Auto CPAP 04/17/17 to 05/16/17 >> used on 29 of 30 nights with average AHI 5 hrs 23.  Average AHI 2.5 with median CPAP 7 and 95 th percentile CPAP 9 cm H2O  Past Medical History: She  has a past medical history of Adenomatous polyp of colon (2008), Allergy, Anemia, Anxiety, Arthritis, Blood transfusion without reported diagnosis, Cataract, Depression, Diabetes mellitus, Dyslipidemia, Fibromyalgia, Gallstones, Gastritis, GERD (gastroesophageal reflux disease), Hemangioma, Hiatal  hernia, Hyperlipidemia, Hypertension, Hypothyroidism (2010), IBS (irritable bowel syndrome), Migraines, Myocardial infarct ([place]) (2010), Obesity, OSA (obstructive sleep apnea), Pneumonia, Seizure disorder ([place]), Seizures ([place]), Sleep apnea, and Urinary tract infection.  Past Surgical History: She  has a past surgical history that includes Cholecystectomy (1985); Abdominal hysterectomy (1988); Tubal ligation (1986); Total knee arthroplasty (Right, 11/2005); Cardiac catheterization (2007); Total knee arthroplasty (Left); steroid injection to left si joint (12/2008); Polypectomy (2012); Colonoscopy with propofol (N/A, 07/16/2014); Colonoscopy; and Colonoscopy with propofol (N/A, 09/21/2016).  Family History: Her family history includes Colon polyps in her brother and mother; Diabetes in her paternal aunt and sister; Esophageal cancer in her brother; Kidney cancer in her sister; Ulcers in her father.  Social History: She  reports that she has never smoked. She has never used smokeless tobacco. She reports that she does not drink alcohol or use drugs.  Medications: Allergies as of 05/18/2017      Reactions   Hydrocodone-acetaminophen Itching   Itches all over; without rash per pt      Medication List        Accurate as of 05/18/17 11:36 AM. Always use your most recent med list.          divalproex 500 MG DR tablet Commonly known as:  DEPAKOTE Take 1 tablet in AM, 2 tablets in PM   Dulaglutide 0.75 MG/0.5ML Sopn Commonly known as:  TRULICITY Inject  0.75 mg into the skin once a week.   DULoxetine 60 MG capsule Commonly known as:  CYMBALTA Take 1 capsule (60 mg total) by mouth daily.   empagliflozin 25 MG Tabs tablet Commonly known as:  JARDIANCE Take 25 mg daily by mouth.   levothyroxine 75 MCG tablet Commonly known as:  SYNTHROID, LEVOTHROID Take 1 tablet (75 mcg total) by mouth daily before breakfast.   losartan 50 MG tablet Commonly known as:  COZAAR TAKE 1 TABLET BY MOUTH ONCE  DAILY   metFORMIN 1000 MG tablet Commonly known as:  GLUCOPHAGE TAKE 1 TABLET BY MOUTH ONCE DAILY WITH BREAKFAST   omeprazole 40 MG capsule Commonly known as:  PRILOSEC Take 1 capsule (40 mg total) by mouth daily.   simvastatin 20 MG tablet Commonly known as:  ZOCOR TAKE 1 TABLET BY MOUTH AT BEDTIME   topiramate 25 MG tablet Commonly known as:  TOPAMAX Take 3 tablets every night   traZODone 50 MG tablet Commonly known as:  DESYREL Take 2 tablets (100 mg total) by mouth at bedtime. for sleep

## 2017-05-25 ENCOUNTER — Ambulatory Visit (INDEPENDENT_AMBULATORY_CARE_PROVIDER_SITE_OTHER): Payer: Medicare Other | Admitting: Internal Medicine

## 2017-05-25 ENCOUNTER — Encounter: Payer: Self-pay | Admitting: Internal Medicine

## 2017-05-25 VITALS — BP 124/76 | HR 99 | Temp 98.2°F | Resp 16 | Wt 283.0 lb

## 2017-05-25 DIAGNOSIS — G43911 Migraine, unspecified, intractable, with status migrainosus: Secondary | ICD-10-CM | POA: Diagnosis not present

## 2017-05-25 DIAGNOSIS — R2689 Other abnormalities of gait and mobility: Secondary | ICD-10-CM | POA: Diagnosis not present

## 2017-05-25 DIAGNOSIS — E785 Hyperlipidemia, unspecified: Secondary | ICD-10-CM

## 2017-05-25 DIAGNOSIS — E039 Hypothyroidism, unspecified: Secondary | ICD-10-CM

## 2017-05-25 DIAGNOSIS — E1151 Type 2 diabetes mellitus with diabetic peripheral angiopathy without gangrene: Secondary | ICD-10-CM | POA: Diagnosis not present

## 2017-05-25 DIAGNOSIS — I10 Essential (primary) hypertension: Secondary | ICD-10-CM | POA: Diagnosis not present

## 2017-05-25 DIAGNOSIS — M160 Bilateral primary osteoarthritis of hip: Secondary | ICD-10-CM | POA: Insufficient documentation

## 2017-05-25 MED ORDER — PROMETHAZINE HCL 25 MG/ML IJ SOLN: 25.0000 mg | Freq: Once | INTRAMUSCULAR | Status: DC

## 2017-05-25 MED ORDER — KETOROLAC TROMETHAMINE 60 MG/2ML IM SOLN
60.0000 mg | Freq: Once | INTRAMUSCULAR | Status: AC
  Administered 2017-05-25: 60 mg via INTRAMUSCULAR

## 2017-05-25 MED ORDER — PROMETHAZINE HCL 25 MG/ML IJ SOLN
25.0000 mg | Freq: Once | INTRAMUSCULAR | Status: AC
  Administered 2017-05-25: 25 mg via INTRAMUSCULAR

## 2017-05-25 MED ORDER — ACETAMINOPHEN-CODEINE #3 300-30 MG PO TABS: 1.0000 | ORAL_TABLET | Freq: Every day | ORAL | 0 refills | Status: DC

## 2017-05-25 MED ORDER — KETOROLAC TROMETHAMINE 60 MG/2ML IM SOLN: 60.0000 mg | Freq: Once | INTRAMUSCULAR | Status: DC

## 2017-05-25 NOTE — Assessment & Plan Note (Signed)
Has had several falls Saw neuro, Dr [name] Brain imaging normal Can not afford other tests Stressed balance exercises Increase exercise Weight loss Will work on improving pain control

## 2017-05-25 NOTE — Assessment & Plan Note (Signed)
Continue statin. 

## 2017-05-25 NOTE — Assessment & Plan Note (Signed)
Started 4 days ago, intractable Severe, but has gotten slightly better Taking excedrin otc Nausea, dizziness Toradol 60 mg IM x 1 Phenergan 25 mg IM x 1

## 2017-05-25 NOTE — Assessment & Plan Note (Signed)
Dr Durward Fortes Severe, deferred surgery  Pain is affecting sleep, activity level otc meds are not effective Discussed options - will current meds tramadol is not a good option Tylenol #3 at nighttime F/u in one month

## 2017-05-25 NOTE — Progress Notes (Signed)
Subjective:    Patient ID: Alexis Grant, female    DOB: Jul 13, 1950, 67 y.o.   MRN: [id number]  HPI The patient is here for follow up.  Severe migraine:  Started 4 days.  Woke up with a severe headache.  Has also had nausea, dizzy, face numb.  Feels slghtlyly better today, but still very bad.  She is taking Excedrin and that helps a little.    poor sleep:  4.5 - 5 hr of sleep - saw dr Halford Chessman.  Reason not sleeping - joint pain. Sleeps in chair most of time.  She is very leary of pain med.  Her hips hurt so bad with laying down.  She has talked to dr whitfield.  She does not want to do surgery.   It is definitely is OA.  She takes aleve prn and it does not help.  She feels she needs to consider something more to help.    Has had several falls:  Did have an ankle fracture.   Saw Dr [name].   She did order some tests, but she can not afford a couple of the tests and does not plan on getting them done.  She has imaging of her brain which was normal.    Dyspnea with exertion:  She does not exercise due her hip pain.  She denies chest pain and palpitation at rest or with exertion.  She recently saw pulmonary.   Hypothyroidism:  She is taking her medication daily.  She denies any recent changes in energy or weight that are unexplained.   Diabetes: She is taking her medication daily as prescribed. She is compliant with a diabetic diet. She is not exercising regularly.  Hyperlipidemia: She is taking her medication daily. She is compliant with a low fat/cholesterol diet. She is not exercising regularly.       Medications and allergies reviewed with patient and updated if appropriate.  Patient Active Problem List   Diagnosis Date Noted  . Osteopenia 11/22/2016  . GERD (gastroesophageal reflux disease) 11/15/2016  . Tingling of both feet 11/15/2016  . Cough 08/16/2016  . Difficulty sleeping 05/24/2016  . Rosacea 11/25/2015  . Poor balance 11/25/2015  . Tremor 04/18/2015  . Other fatigue  02/17/2015  . Migraine with aura and without status migrainosus, not intractable 12/04/2014  . Localization-related (focal) (partial) idiopathic epilepsy and epileptic syndromes with seizures of localized onset, not intractable, without status epilepticus ([place]) 12/04/2014  . Hx of adenomatous colonic polyps 07/16/2014  . Benign neoplasm of ascending colon 07/16/2014  . Benign neoplasm of descending colon 07/16/2014  . Benign neoplasm of cecum 07/16/2014  . Benign neoplasm of transverse colon 07/16/2014  . Fibromyalgia 05/30/2013  . Generalized convulsive epilepsy ([place]) 10/20/2012  . Seasonal allergies 12/08/2011  . DM (diabetes mellitus), type 2 with peripheral vascular complications ([place]) 24/82/5003  . Essential hypertension 01/05/2010  . Irritable bowel syndrome 01/05/2010  . VERTIGO 09/16/2009  . VITAMIN D DEFICIENCY 02/24/2009  . MYOCARDIAL INFARCTION, ACUTE, SUBENDOCARDIAL 10/04/2008  . Hypothyroidism 03/19/2008  . COLONIC POLYPS, ADENOMATOUS, HX OF 08/07/2007  . HEMANGIOMA 04/26/2007  . Dyslipidemia 04/26/2007  . OBESITY 04/26/2007  . Depression 04/26/2007  . SLEEP APNEA, OBSTRUCTIVE 04/26/2007  . HIATAL HERNIA 04/26/2007  . FATTY LIVER DISEASE 04/26/2007  . ARTHRITIS 04/26/2007    Current Outpatient Medications on File Prior to Visit  Medication Sig Dispense Refill  . divalproex (DEPAKOTE) 500 MG DR tablet Take 1 tablet in AM, 2 tablets in PM 90 tablet  11  . Dulaglutide (TRULICITY) 1.44 RX/5.4MG SOPN Inject 0.75 mg into the skin once a week. 4 pen 5  . DULoxetine (CYMBALTA) 60 MG capsule Take 1 capsule (60 mg total) by mouth daily. 90 capsule 1  . empagliflozin (JARDIANCE) 25 MG TABS tablet Take 25 mg daily by mouth. 90 mg 1  . levothyroxine (SYNTHROID, LEVOTHROID) 75 MCG tablet Take 1 tablet (75 mcg total) by mouth daily before breakfast. 90 tablet 1  . losartan (COZAAR) 50 MG tablet TAKE 1 TABLET BY MOUTH ONCE DAILY 90 tablet 1  . metFORMIN (GLUCOPHAGE) 1000 MG tablet  TAKE 1 TABLET BY MOUTH ONCE DAILY WITH BREAKFAST 90 tablet 1  . omeprazole (PRILOSEC) 40 MG capsule Take 1 capsule (40 mg total) by mouth daily. 90 capsule 1  . simvastatin (ZOCOR) 20 MG tablet TAKE 1 TABLET BY MOUTH AT BEDTIME 90 tablet 1  . topiramate (TOPAMAX) 25 MG tablet Take 3 tablets every night 90 tablet 11  . traZODone (DESYREL) 50 MG tablet Take 2 tablets (100 mg total) by mouth at bedtime. for sleep 180 tablet 1  . [DISCONTINUED] Cholecalciferol (VITAMIN D) 2000 UNITS CAPS Take by mouth.       No current facility-administered medications on file prior to visit.     Past Medical History:  Diagnosis Date  . Adenomatous polyp of colon 2008  . Allergy   . Anemia   . Anxiety   . Arthritis   . Blood transfusion without reported diagnosis    following knee surgery   . Cataract    hx- removed both eyes  . Depression   . Diabetes mellitus    type II   . Dyslipidemia   . Fibromyalgia   . Gallstones   . Gastritis   . GERD (gastroesophageal reflux disease)   . Hemangioma   . Hiatal hernia    Fibromyalgia  . Hyperlipidemia   . Hypertension   . Hypothyroidism 2010   Dr.Deveshawr  . IBS (irritable bowel syndrome)    Dr Fuller Plan  . Migraines   . Myocardial infarct ([place]) 2010   subendocardrial, following R TKR  . Obesity   . OSA (obstructive sleep apnea)    cpap - does not know settings   . Pneumonia   . Seizure disorder ([place])   . Seizures ([place])    last seizure was 2002 per pt 07-29-16  . Sleep apnea   . Urinary tract infection     Past Surgical History:  Procedure Laterality Date  . ABDOMINAL HYSTERECTOMY  1988  . CARDIAC CATHETERIZATION  2007   DrGamble ( now seeing Dr Tonita Cong cardiology)  . CHOLECYSTECTOMY  1985  . COLONOSCOPY    . COLONOSCOPY WITH PROPOFOL N/A 07/16/2014   Procedure: COLONOSCOPY WITH PROPOFOL;  Surgeon: Ladene Artist, MD;  Location: [facility];  Service: Endoscopy;  Laterality: N/A;  . COLONOSCOPY WITH PROPOFOL N/A 09/21/2016   Procedure:  COLONOSCOPY WITH PROPOFOL;  Surgeon: Ladene Artist, MD;  Location: [facility];  Service: Endoscopy;  Laterality: N/A;  . POLYPECTOMY  2012   6 or 7 adenomatous, Dr.Stark  . steroid injection to left si joint  12/2008   Dr.Newton  . TOTAL KNEE ARTHROPLASTY Right 11/2005  . TOTAL KNEE ARTHROPLASTY Left   . TUBAL LIGATION  1986    Social History   Socioeconomic History  . Marital status: Widowed    Spouse name: Not on file  . Number of children: 3  . Years of education: college  . Highest  education level: Not on file  Occupational History  . Occupation: Retired  Scientific laboratory technician  . Financial resource strain: Not on file  . Food insecurity:    Worry: Not on file    Inability: Not on file  . Transportation needs:    Medical: Not on file    Non-medical: Not on file  Tobacco Use  . Smoking status: Never Smoker  . Smokeless tobacco: Never Used  Substance and Sexual Activity  . Alcohol use: No    Alcohol/week: 0.0 oz  . Drug use: No  . Sexual activity: Not on file  Lifestyle  . Physical activity:    Days per week: Not on file    Minutes per session: Not on file  . Stress: Not on file  Relationships  . Social connections:    Talks on phone: Not on file    Gets together: Not on file    Attends religious service: Not on file    Active member of club or organization: Not on file    Attends meetings of clubs or organizations: Not on file    Relationship status: Not on file  Other Topics Concern  . Not on file  Social History Narrative   Illicit drug use- no   Patient does not get regular exercise due to knee.   Patient lives at home alone.    Patient son and wife lives next door.   Patient has 3 children.    Patient has some college.    Patient retired May 2014.     Family History  Problem Relation Age of Onset  . Colon polyps Mother   . Ulcers Father   . Colon polyps Brother   . Diabetes Sister   . Diabetes Paternal Aunt   . Esophageal cancer Brother   .  Kidney cancer Sister   . Colon cancer Neg Hx   . Heart attack Neg Hx   . Stroke Neg Hx   . Cancer Neg Hx   . Gallbladder disease Neg Hx   . Rectal cancer Neg Hx   . Stomach cancer Neg Hx     Review of Systems  Constitutional: Negative for chills and fever.  Respiratory: Positive for cough (when wake up in morning) and shortness of breath. Negative for wheezing.   Cardiovascular: Positive for leg swelling (right ankle after fracture). Negative for chest pain and palpitations.  Musculoskeletal: Positive for arthralgias.  Neurological: Positive for dizziness and headaches. Negative for light-headedness.       Objective:   Vitals:   05/25/17 1611  BP: 124/76  Pulse: 99  Resp: 16  Temp: 98.2 F (36.8 C)  SpO2: 96%   BP Readings from Last 3 Encounters:  05/25/17 124/76  05/18/17 110/78  04/08/17 126/64   Wt Readings from Last 3 Encounters:  05/25/17 283 lb (128.4 kg)  05/18/17 282 lb (127.9 kg)  04/08/17 282 lb (127.9 kg)   Body mass index is 50.13 kg/m.   Physical Exam    Constitutional: Appears well-developed and well-nourished. No distress.  HENT:  Head: Normocephalic and atraumatic.  Neck: Neck supple. No tracheal deviation present. No thyromegaly present.  No cervical lymphadenopathy Cardiovascular: Normal rate, regular rhythm and normal heart sounds.   No murmur heard. No carotid bruit .  No edema Pulmonary/Chest: Effort normal and breath sounds normal. No respiratory distress. No has no wheezes. No rales.  Skin: Skin is warm and dry. Not diaphoretic.  Psychiatric: Normal mood and affect. Behavior is normal.  Assessment & Plan:    See Problem List for Assessment and Plan of chronic medical problems.

## 2017-05-25 NOTE — Assessment & Plan Note (Signed)
Check a1c Low sugar / carb diet Stressed regular exercise, weight loss  

## 2017-05-25 NOTE — Assessment & Plan Note (Signed)
Check tsh  Titrate med dose if needed  

## 2017-05-25 NOTE — Patient Instructions (Addendum)
You received Toradol and phenergan for your migraine.  Test(s) ordered today. Your results will be released to [facility] (or called to you) after review, usually within 72hours after test completion. If any changes need to be made, you will be notified at that same time.   Medications reviewed and updated.  Changes include trying Tylenol #3 at night for your hip pain.   Your prescription(s) have been submitted to your pharmacy. Please take as directed and contact our office if you believe you are having problem(s) with the medication(s).   Please followup in 1 month

## 2017-05-25 NOTE — Assessment & Plan Note (Signed)
BP Readings from Last 3 Encounters:  05/25/17 124/76  05/18/17 110/78  04/08/17 126/64   BP well controlled Current regimen effective and well tolerated Continue current medications at current doses cmp

## 2017-06-01 ENCOUNTER — Other Ambulatory Visit (INDEPENDENT_AMBULATORY_CARE_PROVIDER_SITE_OTHER): Payer: Medicare Other

## 2017-06-01 DIAGNOSIS — E1151 Type 2 diabetes mellitus with diabetic peripheral angiopathy without gangrene: Secondary | ICD-10-CM | POA: Diagnosis not present

## 2017-06-01 DIAGNOSIS — E039 Hypothyroidism, unspecified: Secondary | ICD-10-CM | POA: Diagnosis not present

## 2017-06-01 DIAGNOSIS — I10 Essential (primary) hypertension: Secondary | ICD-10-CM

## 2017-06-01 LAB — COMPREHENSIVE METABOLIC PANEL
ALK PHOS: 68 U/L (ref 39–117)
ALT: 14 U/L (ref 0–35)
AST: 8 U/L (ref 0–37)
Albumin: 3.8 g/dL (ref 3.5–5.2)
BILIRUBIN TOTAL: 0.5 mg/dL (ref 0.2–1.2)
BUN: 8 mg/dL (ref 6–23)
CO2: 28 mEq/L (ref 19–32)
CREATININE: 0.67 mg/dL (ref 0.40–1.20)
Calcium: 9.1 mg/dL (ref 8.4–10.5)
Chloride: 102 mEq/L (ref 96–112)
GFR: 93.46 mL/min (ref >60.00)
GLUCOSE: 148 mg/dL — AB (ref 70–99)
Potassium: 3.6 mEq/L (ref 3.5–5.1)
SODIUM: 141 meq/L (ref 135–145)
Total Protein: 6.8 g/dL (ref 6.0–8.3)

## 2017-06-01 LAB — HEMOGLOBIN A1C: Hgb A1c MFr Bld: 7.9 % — ABNORMAL HIGH (ref 4.6–6.5)

## 2017-06-01 LAB — TSH: TSH: 3.53 u[IU]/mL (ref 0.35–4.50)

## 2017-06-02 ENCOUNTER — Other Ambulatory Visit: Payer: Self-pay | Admitting: Internal Medicine

## 2017-06-03 ENCOUNTER — Other Ambulatory Visit: Payer: Self-pay | Admitting: Internal Medicine

## 2017-06-03 MED ORDER — DULAGLUTIDE 1.5 MG/0.5ML ~~LOC~~ SOAJ: 1.5000 mg | SUBCUTANEOUS | 5 refills | Status: DC

## 2017-06-16 ENCOUNTER — Encounter: Payer: Self-pay | Admitting: Internal Medicine

## 2017-06-16 ENCOUNTER — Other Ambulatory Visit: Payer: Self-pay | Admitting: Internal Medicine

## 2017-06-21 ENCOUNTER — Ambulatory Visit (INDEPENDENT_AMBULATORY_CARE_PROVIDER_SITE_OTHER): Payer: Medicare Other | Admitting: Orthopaedic Surgery

## 2017-06-21 ENCOUNTER — Encounter (INDEPENDENT_AMBULATORY_CARE_PROVIDER_SITE_OTHER): Payer: Self-pay | Admitting: Orthopaedic Surgery

## 2017-06-21 ENCOUNTER — Ambulatory Visit (INDEPENDENT_AMBULATORY_CARE_PROVIDER_SITE_OTHER): Payer: Medicare Other

## 2017-06-21 VITALS — BP 123/85 | HR 82 | Ht 63.0 in | Wt 282.0 lb

## 2017-06-21 DIAGNOSIS — M25511 Pain in right shoulder: Secondary | ICD-10-CM | POA: Diagnosis not present

## 2017-06-21 NOTE — Progress Notes (Signed)
Office Visit Note   Patient: Alexis Grant           Date of Birth: [birth year]           MRN: [id number] Visit Date: 06/21/2017              Requested by: Binnie Rail, MD [place], [place] [address] PCP: Binnie Rail, MD   Assessment & Plan: Visit Diagnoses:  1. Acute pain of right shoulder     Plan: Right shoulder pain with several diagnostic possibilities including a rotator cuff tear.  I would suggest giving a little about 2 weeks and then have her return to the office.  At that point we could consider cortisone injection.No  acute changes by x-ray.  Symptomatically improving over the last several days so hopefully this will resolve on its own  Follow-Up Instructions: No follow-ups on file.   Orders:  Orders Placed This Encounter  Procedures  . XR Shoulder Right   No orders of the defined types were placed in this encounter.     Procedures: No procedures performed   Clinical Data: No additional findings.   Subjective: Chief Complaint  Patient presents with  . Right Shoulder - Pain, Injury  . New Patient (Initial Visit)    R SHOULDER PAIN 2 WEEKS FELL AND CAUGHT HERSELF, NO PRIOR SURGERIES  Alexis Grant tripped about 2 weeks ago and tried to break her fall with outstretched upper extremities.  She actually prevented the fall by holding onto a bar.  At that point she experienced onset of right shoulder pain which has persisted.  She is having difficulty raising her arm over her head and even sleeping on that side.  No bruising.  No numbness or tingling.  No related neck pain or left shoulder discomfort.  HPI  Review of Systems  Constitutional: Negative for fatigue and fever.  HENT: Negative for ear pain.   Eyes: Negative for pain.  Respiratory: Negative for cough and shortness of breath.   Cardiovascular: Positive for leg swelling.  Gastrointestinal: Positive for constipation. Negative for diarrhea.  Genitourinary: Negative for difficulty  urinating.  Musculoskeletal: Negative for back pain and neck pain.  Skin: Negative for rash.  Allergic/Immunologic: Negative for food allergies.  Neurological: Negative for weakness and numbness.  Hematological: Bruises/bleeds easily.  Psychiatric/Behavioral: Positive for sleep disturbance.     Objective: Vital Signs: BP 123/85 (BP Location: Right Arm, Patient Position: Sitting, Cuff Size: Normal)   Pulse 82   Ht 5\' 3"  (1.6 m)   Wt 282 lb (127.9 kg)   BMI 49.95 kg/m   Physical Exam  Constitutional: She is oriented to person, place, and time. She appears well-developed and well-nourished.  HENT:  Mouth/Throat: Oropharynx is clear and moist.  Eyes: Pupils are equal, round, and reactive to light. EOM are normal.  Pulmonary/Chest: Effort normal.  Neurological: She is alert and oriented to person, place, and time.  Skin: Skin is warm and dry.  Psychiatric: She has a normal mood and affect. Her behavior is normal.    Ortho Exam awake alert and oriented x3 comfortable sitting.  Positive impingement right shoulder.  Positive empty can testing.  I could raise her arm to about 100 degrees.  I felt like a go further but she was uncomfortable.  Skin was intact without ecchymosis or erythema.  Large arms.  Biceps appears to be in place .BMI 49.  No crepitation.  Specialty Comments:  No specialty comments available.  Imaging: No results found.   PMFS History: Patient Active Problem List   Diagnosis Date Noted  . Intractable migraine with status migrainosus 05/25/2017  . Osteoarthritis, hip, bilateral 05/25/2017  . Osteopenia 11/22/2016  . GERD (gastroesophageal reflux disease) 11/15/2016  . Tingling of both feet 11/15/2016  . Difficulty sleeping 05/24/2016  . Rosacea 11/25/2015  . Poor balance 11/25/2015  . Tremor 04/18/2015  . Other fatigue 02/17/2015  . Migraine with aura and without status migrainosus, not intractable 12/04/2014  . Localization-related (focal) (partial)  idiopathic epilepsy and epileptic syndromes with seizures of localized onset, not intractable, without status epilepticus ([place]) 12/04/2014  . Hx of adenomatous colonic polyps 07/16/2014  . Benign neoplasm of ascending colon 07/16/2014  . Benign neoplasm of descending colon 07/16/2014  . Benign neoplasm of cecum 07/16/2014  . Benign neoplasm of transverse colon 07/16/2014  . Fibromyalgia 05/30/2013  . Generalized convulsive epilepsy ([place]) 10/20/2012  . Seasonal allergies 12/08/2011  . DM (diabetes mellitus), type 2 with peripheral vascular complications ([place]) 77/41/2878  . Essential hypertension 01/05/2010  . Irritable bowel syndrome 01/05/2010  . VERTIGO 09/16/2009  . VITAMIN D DEFICIENCY 02/24/2009  . MYOCARDIAL INFARCTION, ACUTE, SUBENDOCARDIAL 10/04/2008  . Hypothyroidism 03/19/2008  . COLONIC POLYPS, ADENOMATOUS, HX OF 08/07/2007  . HEMANGIOMA 04/26/2007  . Dyslipidemia 04/26/2007  . OBESITY 04/26/2007  . Depression 04/26/2007  . SLEEP APNEA, OBSTRUCTIVE 04/26/2007  . HIATAL HERNIA 04/26/2007  . FATTY LIVER DISEASE 04/26/2007  . ARTHRITIS 04/26/2007   Past Medical History:  Diagnosis Date  . Adenomatous polyp of colon 2008  . Allergy   . Anemia   . Anxiety   . Arthritis   . Blood transfusion without reported diagnosis    following knee surgery   . Cataract    hx- removed both eyes  . Depression   . Diabetes mellitus    type II   . Dyslipidemia   . Fibromyalgia   . Gallstones   . Gastritis   . GERD (gastroesophageal reflux disease)   . Hemangioma   . Hiatal hernia    Fibromyalgia  . Hyperlipidemia   . Hypertension   . Hypothyroidism 2010   Dr.Deveshawr  . IBS (irritable bowel syndrome)    Dr Fuller Plan  . Migraines   . Myocardial infarct ([place]) 2010   subendocardrial, following R TKR  . Obesity   . OSA (obstructive sleep apnea)    cpap - does not know settings   . Pneumonia   . Seizure disorder ([place])   . Seizures ([place])    last seizure was 2002 per pt  07-29-16  . Sleep apnea   . Urinary tract infection     Family History  Problem Relation Age of Onset  . Colon polyps Mother   . Ulcers Father   . Colon polyps Brother   . Diabetes Sister   . Diabetes Paternal Aunt   . Esophageal cancer Brother   . Kidney cancer Sister   . Colon cancer Neg Hx   . Heart attack Neg Hx   . Stroke Neg Hx   . Cancer Neg Hx   . Gallbladder disease Neg Hx   . Rectal cancer Neg Hx   . Stomach cancer Neg Hx     Past Surgical History:  Procedure Laterality Date  . ABDOMINAL HYSTERECTOMY  1988  . CARDIAC CATHETERIZATION  2007   DrGamble ( now seeing Dr Tonita Cong cardiology)  . CHOLECYSTECTOMY  1985  . COLONOSCOPY    . COLONOSCOPY WITH PROPOFOL N/A 07/16/2014  Procedure: COLONOSCOPY WITH PROPOFOL;  Surgeon: Ladene Artist, MD;  Location: [facility];  Service: Endoscopy;  Laterality: N/A;  . COLONOSCOPY WITH PROPOFOL N/A 09/21/2016   Procedure: COLONOSCOPY WITH PROPOFOL;  Surgeon: Ladene Artist, MD;  Location: [facility];  Service: Endoscopy;  Laterality: N/A;  . POLYPECTOMY  2012   6 or 7 adenomatous, Dr.Stark  . steroid injection to left si joint  12/2008   Dr.Newton  . TOTAL KNEE ARTHROPLASTY Right 11/2005  . TOTAL KNEE ARTHROPLASTY Left   . TUBAL LIGATION  1986   Social History   Occupational History  . Occupation: Retired  Tobacco Use  . Smoking status: Never Smoker  . Smokeless tobacco: Never Used  Substance and Sexual Activity  . Alcohol use: No    Alcohol/week: 0.0 oz  . Drug use: No  . Sexual activity: Not on file

## 2017-06-26 NOTE — Progress Notes (Signed)
Subjective:    Patient ID: Alexis Grant, female    DOB: 19-Jun-1950, 67 y.o.   MRN: [id number]  HPI The patient is here for follow up.  Severe b/l hip arthritis:  She has severe arthritis.  The pain was affecting her sleep and otc medication were not effective.  One month ago we started Tylenol #3 at night.   She is taking two pills at night and this is providing significant relief of her pain and allowing for to get sleep.  She feels much better because her sleep is better, everything feels better.  She feels this amount is adequate for controlling her pain.  She denies any side effects, including constipation.  Sleep difficulties:  She is taking trazodone nightly.  Overall sleep is well controlled.  Diabetes: Her last A1c was elevated and we did increase her Trulicity to 1.5 mg weekly.  She has had some abdominal cramping, but states this is transient and unsure if it is related to the medication or not.  She denies any nausea.  She feels she is doing better with watching what she is eating.  She is not exercising regularly.  Medications and allergies reviewed with patient and updated if appropriate.  Patient Active Problem List   Diagnosis Date Noted  . Intractable migraine with status migrainosus 05/25/2017  . Osteoarthritis, hip, bilateral 05/25/2017  . Osteopenia 11/22/2016  . GERD (gastroesophageal reflux disease) 11/15/2016  . Tingling of both feet 11/15/2016  . Difficulty sleeping 05/24/2016  . Rosacea 11/25/2015  . Poor balance 11/25/2015  . Tremor 04/18/2015  . Other fatigue 02/17/2015  . Migraine with aura and without status migrainosus, not intractable 12/04/2014  . 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 Hx of adenomatous colonic polyps 07/16/2014  . Benign neoplasm of ascending colon 07/16/2014  . Benign neoplasm of  descending colon 07/16/2014  . Benign neoplasm of cecum 07/16/2014  . Benign neoplasm of transverse colon 07/16/2014  . Fibromyalgia 05/30/2013  . Generalized convulsive epilepsy ([place]) 10/20/2012  . Seasonal allergies 12/08/2011  . DM (diabetes mellitus), type 2 with peripheral vascular complications ([place]) 71/24/5809  . Essential hypertension 01/05/2010  . Irritable bowel syndrome 01/05/2010  . VERTIGO 09/16/2009  . VITAMIN D DEFICIENCY 02/24/2009  . MYOCARDIAL INFARCTION, ACUTE, SUBENDOCARDIAL 10/04/2008  . Hypothyroidism 03/19/2008  . COLONIC POLYPS, ADENOMATOUS, HX OF 08/07/2007  . HEMANGIOMA 04/26/2007  . Dyslipidemia 04/26/2007  . OBESITY 04/26/2007  . Depression 04/26/2007  . SLEEP APNEA, OBSTRUCTIVE 04/26/2007  . HIATAL HERNIA 04/26/2007  . FATTY LIVER DISEASE 04/26/2007  . ARTHRITIS 04/26/2007    Current Outpatient Medications on File Prior to Visit  Medication Sig Dispense Refill  . acetaminophen-codeine (TYLENOL #3) 300-30 MG tablet Take 1-2 tablets by mouth at bedtime. For chronic hip pain 60 tablet 0  . divalproex (DEPAKOTE) 500 MG DR tablet Take 1 tablet in AM, 2 tablets in PM 90 tablet 11  . Dulaglutide (TRULICITY) 1.5 XI/3.3AS SOPN Inject 1.5 mg into the skin once a week. 4 pen 5  . DULoxetine (CYMBALTA) 60 MG capsule Take 1 capsule (60 mg total) by mouth daily. 90 capsule 1  . empagliflozin (JARDIANCE) 25 MG TABS tablet Take 25 mg by mouth daily. 90 tablet 1  . levothyroxine (SYNTHROID, LEVOTHROID) 75 MCG tablet Take 1 tablet (75 mcg total) by mouth daily before breakfast. 90 tablet 1  . losartan (COZAAR) 50 MG tablet TAKE 1  TABLET BY MOUTH ONCE DAILY 90 tablet 1  . metFORMIN (GLUCOPHAGE) 1000 MG tablet TAKE 1 TABLET BY MOUTH ONCE DAILY WITH BREAKFAST 90 tablet 1  . omeprazole (PRILOSEC) 40 MG capsule Take 1 capsule (40 mg total) by mouth daily. 90 capsule 1  . simvastatin (ZOCOR) 20 MG tablet TAKE 1 TABLET BY MOUTH AT BEDTIME 90 tablet 1  . topiramate (TOPAMAX) 25  MG tablet Take 3 tablets every night 90 tablet 11  . traZODone (DESYREL) 50 MG tablet TAKE 2 TABLETS BY MOUTH AT BEDTIME FOR SLEEP 180 tablet 1  . [DISCONTINUED] Cholecalciferol (VITAMIN D) 2000 UNITS CAPS Take by mouth.       No current facility-administered medications on file prior to visit.     Past Medical History:  Diagnosis Date  . Adenomatous polyp of colon 2008  . Allergy   . Anemia   . Anxiety   . Arthritis   . Blood transfusion without reported diagnosis    following knee surgery   . Cataract    hx- removed both eyes  . Depression   . Diabetes mellitus    type II   . Dyslipidemia   . Fibromyalgia   . Gallstones   . Gastritis   . GERD (gastroesophageal reflux disease)   . Hemangioma   . Hiatal hernia    Fibromyalgia  . Hyperlipidemia   . Hypertension   . Hypothyroidism 2010   Dr.Deveshawr  . IBS (irritable bowel syndrome)    Dr Fuller Plan  . Migraines   . Myocardial infarct ([place]) 2010   subendocardrial, following R TKR  . Obesity   . OSA (obstructive sleep apnea)    cpap - does not know settings   . Pneumonia   . Seizure disorder ([facility])   . Seizures ([place])    last seizure was 2002 per pt 07-29-16  . Sleep apnea   . Urinary tract infection     Past Surgical History:  Procedure Laterality Date  . ABDOMINAL HYSTERECTOMY  1988  . CARDIAC CATHETERIZATION  2007   DrGamble ( now seeing Dr Tonita Cong cardiology)  . CHOLECYSTECTOMY  1985  . COLONOSCOPY    . COLONOSCOPY WITH PROPOFOL N/A 07/16/2014   Procedure: COLONOSCOPY WITH PROPOFOL;  Surgeon: Ladene Artist, MD;  Location: [facility];  Service: Endoscopy;  Laterality: N/A;  . COLONOSCOPY WITH PROPOFOL N/A 09/21/2016   Procedure: COLONOSCOPY WITH PROPOFOL;  Surgeon: Ladene Artist, MD;  Location: [facility];  Service: Endoscopy;  Laterality: N/A;  . POLYPECTOMY  2012   6 or 7 adenomatous, Dr.Stark  . steroid injection to left si joint  12/2008   Dr.Newton  . TOTAL KNEE ARTHROPLASTY Right 11/2005  .  TOTAL KNEE ARTHROPLASTY Left   . TUBAL LIGATION  1986    Social History   Socioeconomic History  . Marital status: Widowed    Spouse name: Not on file  . Number of children: 3  . Years of education: college  . Highest education level: Not on file  Occupational History  . Occupation: Retired  Scientific laboratory technician  . Financial resource strain: Not on file  . Food insecurity:    Worry: Not on file    Inability: Not on file  . Transportation needs:    Medical: Not on file    Non-medical: Not on file  Tobacco Use  . Smoking status: Never Smoker  . Smokeless tobacco: Never Used  Substance and Sexual Activity  . Alcohol use: No    Alcohol/week: 0.0 oz  .  Drug use: No  . Sexual activity: Not on file  Lifestyle  . Physical activity:    Days per week: Not on file    Minutes per session: Not on file  . Stress: Not on file  Relationships  . Social connections:    Talks on phone: Not on file    Gets together: Not on file    Attends religious service: Not on file    Active member of club or organization: Not on file    Attends meetings of clubs or organizations: Not on file    Relationship status: Not on file  Other Topics Concern  . Not on file  Social History Narrative   Illicit drug use- no   Patient does not get regular exercise due to knee.   Patient lives at home alone.    Patient son and wife lives next door.   Patient has 3 children.    Patient has some college.    Patient retired May 2014.     Family History  Problem Relation Age of Onset  . Colon polyps Mother   . Ulcers Father   . Colon polyps Brother   . Diabetes Sister   . Diabetes Paternal Aunt   . Esophageal cancer Brother   . Kidney cancer Sister   . Colon cancer Neg Hx   . Heart attack Neg Hx   . Stroke Neg Hx   . Cancer Neg Hx   . Gallbladder disease Neg Hx   . Rectal cancer Neg Hx   . Stomach cancer Neg Hx     Review of Systems  Constitutional: Negative for chills and fever.  Respiratory:  Positive for shortness of breath (related to obesity, sedentary lifestyle).   Cardiovascular: Negative for chest pain and palpitations.  Gastrointestinal: Positive for abdominal pain (cramping - randomly). Negative for constipation and diarrhea.  Musculoskeletal: Positive for arthralgias.  Neurological: Negative for light-headedness and headaches.       Objective:   Vitals:   06/27/17 1612  BP: 114/72  Pulse: (!) 106  Resp: 16  Temp: 98.6 F (37 C)  SpO2: 96%   BP Readings from Last 3 Encounters:  06/27/17 114/72  06/21/17 123/85  05/25/17 124/76   Wt Readings from Last 3 Encounters:  06/27/17 289 lb (131.1 kg)  06/21/17 282 lb (127.9 kg)  05/25/17 283 lb (128.4 kg)   Body mass index is 51.19 kg/m.   Physical Exam    Constitutional: Appears well-developed and well-nourished. No distress.  Skin: Skin is warm and dry. Not diaphoretic.  Psychiatric: Normal mood and affect. Behavior is normal.      Assessment & Plan:    See Problem List for Assessment and Plan of chronic medical problems.

## 2017-06-27 ENCOUNTER — Ambulatory Visit (INDEPENDENT_AMBULATORY_CARE_PROVIDER_SITE_OTHER): Payer: Medicare Other | Admitting: Internal Medicine

## 2017-06-27 ENCOUNTER — Encounter: Payer: Self-pay | Admitting: Internal Medicine

## 2017-06-27 VITALS — BP 114/72 | HR 106 | Temp 98.6°F | Resp 16 | Wt 289.0 lb

## 2017-06-27 DIAGNOSIS — M160 Bilateral primary osteoarthritis of hip: Secondary | ICD-10-CM | POA: Diagnosis not present

## 2017-06-27 DIAGNOSIS — E1151 Type 2 diabetes mellitus with diabetic peripheral angiopathy without gangrene: Secondary | ICD-10-CM

## 2017-06-27 DIAGNOSIS — G479 Sleep disorder, unspecified: Secondary | ICD-10-CM | POA: Diagnosis not present

## 2017-06-27 DIAGNOSIS — Z23 Encounter for immunization: Secondary | ICD-10-CM

## 2017-06-27 MED ORDER — ACETAMINOPHEN-CODEINE #3 300-30 MG PO TABS: 1.0000 | ORAL_TABLET | Freq: Every day | ORAL | 0 refills | Status: DC

## 2017-06-27 MED ORDER — DULOXETINE HCL 60 MG PO CPEP: 60.0000 mg | ORAL_CAPSULE | Freq: Every day | ORAL | 1 refills | Status: DC

## 2017-06-27 NOTE — Assessment & Plan Note (Signed)
Improved with hip pain being treated with Tylenol 3 Continue trazodone at current dose Not experiencing any side effects with both pain medication and trazodone

## 2017-06-27 NOTE — Assessment & Plan Note (Signed)
She wants to avoid surgery due to complications she has had with prior surgeries Hip arthritic pain is severe and was significantly affecting her sleep and therefore quality of life Over-the-counter medications were not effective Tramadol was not an option for her Tylenol 3-2 tabs at night started 1 month ago and that has improved her sleep and pain overall She is very happy with medication and denies side effects Follow-up in 3 months, sooner if needed

## 2017-06-27 NOTE — Assessment & Plan Note (Signed)
Last A1c elevated and sugars were not controlled Trulicity increased to 1.5 mg weekly She feels she is doing better with watching what she is eating Unable to exercise secondary to severe arthritis pain Hopefully Trulicity will help with weight loss Follow-up in 3 months

## 2017-06-27 NOTE — Patient Instructions (Signed)
   Medications reviewed and updated.  No changes recommended at this time.  Your prescription(s) have been submitted to your pharmacy. Please take as directed and contact our office if you believe you are having problem(s) with the medication(s).    Please followup in 3 months   

## 2017-07-04 ENCOUNTER — Ambulatory Visit (INDEPENDENT_AMBULATORY_CARE_PROVIDER_SITE_OTHER): Payer: Medicare Other | Admitting: Orthopaedic Surgery

## 2017-07-05 ENCOUNTER — Encounter: Payer: Self-pay | Admitting: Neurology

## 2017-07-05 ENCOUNTER — Ambulatory Visit (INDEPENDENT_AMBULATORY_CARE_PROVIDER_SITE_OTHER): Payer: Medicare Other | Admitting: Neurology

## 2017-07-05 VITALS — BP 108/60 | HR 98 | Ht 63.0 in | Wt 286.0 lb

## 2017-07-05 DIAGNOSIS — G40309 Generalized idiopathic epilepsy and epileptic syndromes, not intractable, without status epilepticus: Secondary | ICD-10-CM | POA: Diagnosis not present

## 2017-07-05 DIAGNOSIS — R2681 Unsteadiness on feet: Secondary | ICD-10-CM | POA: Diagnosis not present

## 2017-07-05 DIAGNOSIS — G43109 Migraine with aura, not intractable, without status migrainosus: Secondary | ICD-10-CM

## 2017-07-05 DIAGNOSIS — R251 Tremor, unspecified: Secondary | ICD-10-CM

## 2017-07-05 MED ORDER — TOPIRAMATE 25 MG PO TABS: ORAL_TABLET | ORAL | 11 refills | Status: DC

## 2017-07-05 MED ORDER — DIVALPROEX SODIUM 500 MG PO DR TAB: DELAYED_RELEASE_TABLET | ORAL | 11 refills | Status: DC

## 2017-07-05 NOTE — Patient Instructions (Addendum)
1. Increase Topamax 25mg : take 1 tablet in AM, 2 tablets in PM 2. Reduce Depakote 500mg : Take 1 tablet twice a day 3. Schedule nerve test with Dr. Posey Pronto 4. Would proceed with Balance Therapy (contact information: [phone]) 5. Consider getting a walker to help better with balance 6. Follow-up in 6 months, call for any changes  Seizure Precautions: 1. If medication has been prescribed for you to prevent seizures, take it exactly as directed.  Do not stop taking the medicine without talking to your doctor first, even if you have not had a seizure in a long time.   2. Avoid activities in which a seizure would cause danger to yourself or to others.  Don't operate dangerous machinery, swim alone, or climb in high or dangerous places, such as on ladders, roofs, or girders.  Do not drive unless your doctor says you may.  3. If you have any warning that you may have a seizure, lay down in a safe place where you can't hurt yourself.    4.  No driving for 6 months from last seizure, as per [facility].   Please refer to the following link on the [place] website for more information: http://[email or web address]/[email or web address]/drivingu.cfm   5.  Maintain good sleep hygiene. Avoid alcohol.  6.  Contact your doctor if you have any problems that may be related to the medicine you are taking.  7.  Call 911 and bring the patient back to the ED if:        A.  The seizure lasts longer than 5 minutes.       B.  The patient doesn't awaken shortly after the seizure  C.  The patient has new problems such as difficulty seeing, speaking or moving  D.  The patient was injured during the seizure  E.  The patient has a temperature over 102 F (39C)  F.  The patient vomited and now is having trouble breathing

## 2017-07-05 NOTE — Progress Notes (Signed)
NEUROLOGY FOLLOW UP OFFICE NOTE  [name] [id number]  DOB: 1950/03/27  HISTORY OF PRESENT ILLNESS: I had the pleasure of seeing Alexis Grant in follow-up in the neurology clinic on 07/05/2017.  The patient was last seen 3 months ago. She has seizures and migraines, but last visit reported new symptoms of frequent falls and worsening tremors. No seizures since 2002. She is taking Depakote 500mg  in AM, 1000mg  in PM and Topamax 50mg  qhs (had side effects on 75mg  dose). One her last visit, she report left-sided sharp pain in her back, bladder incontinence, and frequent falls where she fractured her right ankle. No loss of consciousness with the falls. I personally reviewed MRI brain with and without contrast which did not show any acute changes, there was mild diffuse atrophy and minimal chronic microvascular disease. She missed her EMG appointment. Since her last visit, she has had a couple of falls. She has a lot of fear of falling. The tremors are getting worse, she has more difficulty with buttons and uses a spoon because food drops with a fork. She is having more headaches and got a migraine cocktail at her PCP office.   HPI 11/28/2014: This is a pleasant 67 yo RH woman with a history of hypertension, hyperlipidemia, diabetes, obesity, sleep apnea, CAD s/p MI, with seizures and migraines. She reports seizures started at age 56, she woke up in the hospital with no prior warning symptoms. Since then, she has had 4 generalized tonic-clonic seizures, last was in 2002. She also report 5 or 6 "little episodes" where she feels dizzy, with blurred vision, then if she sits down quickly and tries to relax, she can avoid any progression. She would feel briefly confused. No associated olfactory/gustatory hallucinations, focal numbness/tingling/weakness, myoclonic jerks. The last time she had these episodes was at least 12 years ago. She recalls taking Neurontin initially, which did not help. Topamax in  the past which caused GI symptoms. She has been taking Depakote ER 500mg  in AM, 1000mg  in PM for many years now with no side effects. She reports having brain imaging and EEG in the past which were normal, no records available for review.   She reports migraines started at age 27. She has an aura of numbness in her face, cheeks, and temple, then she becomes very sensitive to lights, sounds, and smells. She sees flashing lights then her vision becomes blurred, followed by throbbing headaches in the frontal or occipital regions with associated nausea. No associated focal numbness/tingling/weakness in the extremities. She tried Imitrex with minimal effect. A course of Prednisone   She has chronic neck pain and has been dealing with diarrhea from IBS. She had an MI in 2010.   Epilepsy Risk Factors:  There is a strong family history of seizures in her paternal grandfather, paternal aunt, nephew. Otherwise she had a normal birth and early development.  There is no history of febrile convulsions, CNS infections such as meningitis/encephalitis, significant traumatic brain injury, neurosurgical procedures.  PAST MEDICAL HISTORY: Past Medical History:  Diagnosis Date  . Adenomatous polyp of colon 2008  . Allergy   . Anemia   . Anxiety   . Arthritis   . Blood transfusion without reported diagnosis    following knee surgery   . Cataract    hx- removed both eyes  . Depression   . Diabetes mellitus    type II   . Dyslipidemia   . Fibromyalgia   . Gallstones   . Gastritis   .  GERD (gastroesophageal reflux disease)   . Hemangioma   . Hiatal hernia    Fibromyalgia  . Hyperlipidemia   . Hypertension   . Hypothyroidism 2010   Dr.Deveshawr  . IBS (irritable bowel syndrome)    Dr Fuller Plan  . Migraines   . Myocardial infarct ([place]) 2010   subendocardrial, following R TKR  . Obesity   . OSA (obstructive sleep apnea)    cpap - does not know settings   . Pneumonia   . Seizure disorder ([place])   .  Seizures ([place])    last seizure was 2002 per pt 07-29-16  . Sleep apnea   . Urinary tract infection     MEDICATIONS: Current Outpatient Medications on File Prior to Visit  Medication Sig Dispense Refill  . acetaminophen-codeine (TYLENOL #3) 300-30 MG tablet Take 1-2 tablets by mouth at bedtime. For chronic hip pain 60 tablet 0  . divalproex (DEPAKOTE) 500 MG DR tablet Take 1 tablet in AM, 2 tablets in PM 90 tablet 11  . Dulaglutide (TRULICITY) 1.5 HY/0.7PX SOPN Inject 1.5 mg into the skin once a week. 4 pen 5  . DULoxetine (CYMBALTA) 60 MG capsule Take 1 capsule (60 mg total) by mouth daily. 90 capsule 1  . empagliflozin (JARDIANCE) 25 MG TABS tablet Take 25 mg by mouth daily. 90 tablet 1  . levothyroxine (SYNTHROID, LEVOTHROID) 75 MCG tablet Take 1 tablet (75 mcg total) by mouth daily before breakfast. 90 tablet 1  . losartan (COZAAR) 50 MG tablet TAKE 1 TABLET BY MOUTH ONCE DAILY 90 tablet 1  . metFORMIN (GLUCOPHAGE) 1000 MG tablet TAKE 1 TABLET BY MOUTH ONCE DAILY WITH BREAKFAST 90 tablet 1  . omeprazole (PRILOSEC) 40 MG capsule Take 1 capsule (40 mg total) by mouth daily. 90 capsule 1  . simvastatin (ZOCOR) 20 MG tablet TAKE 1 TABLET BY MOUTH AT BEDTIME 90 tablet 1  . topiramate (TOPAMAX) 25 MG tablet Take 3 tablets every night 90 tablet 11  . traZODone (DESYREL) 50 MG tablet TAKE 2 TABLETS BY MOUTH AT BEDTIME FOR SLEEP 180 tablet 1  . [DISCONTINUED] Cholecalciferol (VITAMIN D) 2000 UNITS CAPS Take by mouth.       No current facility-administered medications on file prior to visit.     ALLERGIES: Allergies  Allergen Reactions  . Hydrocodone-Acetaminophen Itching    Itches all over; without rash per pt    FAMILY HISTORY: Family History  Problem Relation Age of Onset  . Colon polyps Mother   . Ulcers Father   . Colon polyps Brother   . Diabetes Sister   . Diabetes Paternal Aunt   . Esophageal cancer Brother   . Kidney cancer Sister   . Colon cancer Neg Hx   . Heart attack  Neg Hx   . Stroke Neg Hx   . Cancer Neg Hx   . Gallbladder disease Neg Hx   . Rectal cancer Neg Hx   . Stomach cancer Neg Hx     SOCIAL HISTORY: Social History   Socioeconomic History  . Marital status: Widowed    Spouse name: Not on file  . Number of children: 3  . Years of education: college  . Highest education level: Not on file  Occupational History  . Occupation: Retired  Scientific laboratory technician  . Financial resource strain: Not on file  . Food insecurity:    Worry: Not on file    Inability: Not on file  . Transportation needs:    Medical: Not on file  Non-medical: Not on file  Tobacco Use  . Smoking status: Never Smoker  . Smokeless tobacco: Never Used  Substance and Sexual Activity  . Alcohol use: No    Alcohol/week: 0.0 oz  . Drug use: No  . Sexual activity: Not on file  Lifestyle  . Physical activity:    Days per week: Not on file    Minutes per session: Not on file  . Stress: Not on file  Relationships  . Social connections:    Talks on phone: Not on file    Gets together: Not on file    Attends religious service: Not on file    Active member of club or organization: Not on file    Attends meetings of clubs or organizations: Not on file    Relationship status: Not on file  . Intimate partner violence:    Fear of current or ex partner: Not on file    Emotionally abused: Not on file    Physically abused: Not on file    Forced sexual activity: Not on file  Other Topics Concern  . Not on file  Social History Narrative   Illicit drug use- no   Patient does not get regular exercise due to knee.   Patient lives at home alone.    Patient son and wife lives next door.   Patient has 3 children.    Patient has some college.    Patient retired May 2014.     REVIEW OF SYSTEMS: Constitutional: No fevers, chills, or sweats, no generalized fatigue, change in appetite Eyes: No visual changes, double vision, eye pain Ear, nose and throat: No hearing loss, ear pain,  nasal congestion, sore throat Cardiovascular: No chest pain, palpitations Respiratory:  No shortness of breath at rest or with exertion, wheezes GastrointestinaI: No nausea, vomiting, diarrhea, abdominal pain, fecal incontinence Genitourinary:  No dysuria, urinary retention or frequency Musculoskeletal:  + neck pain,no back pain Integumentary: No rash, pruritus, skin lesions Neurological: as above Psychiatric: No depression, insomnia, anxiety Endocrine: No palpitations, fatigue, diaphoresis, mood swings, change in appetite, change in weight, increased thirst Hematologic/Lymphatic:  No anemia, purpura, petechiae. Allergic/Immunologic: no itchy/runny eyes, nasal congestion, recent allergic reactions, rashes  PHYSICAL EXAM: Vitals:   07/05/17 1255  BP: 108/60  Pulse: 98  SpO2: 97%   General: No acute distress Head:  Normocephalic/atraumatic Neck: supple, no paraspinal tenderness, full range of motion Heart:  Regular rate and rhythm Lungs:  Clear to auscultation bilaterally Back: No paraspinal tenderness Skin/Extremities: No rash, no edema Neurological Exam: alert and oriented to person, place, and time. No aphasia or dysarthria. Fund of knowledge is appropriate.  Recent and remote memory are intact.  Attention and concentration are normal.    Able to name objects and repeat phrases. Cranial nerves: Pupils equal, round, reactive to light.  Extraocular movements intact with no nystagmus. Visual fields full. Facial sensation intact. No facial asymmetry. Tongue, uvula, palate midline.  Motor: Bulk and tone normal, mild cogwheeling R>L (similar to prior),muscle strength 5/5 throughout with no pronator drift.  Sensation decreased pin and vibration to ankles bilaterally.  No extinction to double simultaneous stimulation.  Deep tendon reflexes 2+ throughout, toes downgoing.  Finger to nose testing intact.  Gait slow and cautious with cane, no ataxia, unable to tandem walk.  Romberg negative. No  resting tremor. Mild hand tremor, R>L with ambulation, decreased arm swing on right. No clear resting tremor, +bilateral postural > action tremor (similar to prior). Negative pull test.  IMPRESSION:  This is a pleasant 67 yo RH woman with a history of hypertension, hyperlipidemia, obesity, sleep apnea on CPAP, CAD s/p MI, with a history of convulsive seizures, well-controlled on Depakote with no seizures since 2002. She has had migraines since her teenage years. Higher dose of Depakote caused tremors. Topamax was added for migraines and essential tremor. She reported increased falls over the past few months, exam shows tremors, neuropathy. MRI brain unremarkable. Proceed with EMG/NCV of both lower extremities as planned. We again discussed Balance therapy, she has home exercises from a PT friend but has not attended formal PT, she was encouraged to do so. She reports worsened tremors and headaches. We will reduce Depakote to to 500mg  BID and increase Topamax to 25mg  in AM, 50mg  in PM. She will follow-up in 6 months and knows to call for any changes.   Thank you for allowing me to participate in her care.  Please do not hesitate to call for any questions or concerns.  The duration of this appointment visit was 26 minutes of face-to-face time with the patient.  Greater than 50% of this time was spent in counseling, explanation of diagnosis, planning of further management, and coordination of care.   Ellouise Newer, M.D.   CC: Dr. Quay Burow

## 2017-07-12 ENCOUNTER — Encounter: Payer: Self-pay | Admitting: Neurology

## 2017-07-29 ENCOUNTER — Other Ambulatory Visit: Payer: Self-pay | Admitting: Internal Medicine

## 2017-07-29 NOTE — Telephone Encounter (Signed)
[place] Controlled Substance Database checked. Last filled on 06/27/17

## 2017-08-02 ENCOUNTER — Ambulatory Visit (INDEPENDENT_AMBULATORY_CARE_PROVIDER_SITE_OTHER): Payer: Medicare Other | Admitting: Neurology

## 2017-08-02 DIAGNOSIS — R296 Repeated falls: Secondary | ICD-10-CM

## 2017-08-02 DIAGNOSIS — R2681 Unsteadiness on feet: Secondary | ICD-10-CM

## 2017-08-02 DIAGNOSIS — G629 Polyneuropathy, unspecified: Secondary | ICD-10-CM

## 2017-08-02 NOTE — Procedures (Signed)
[facility]  [place], [address]  [address], [place] [address] Tel: [phone] Fax:  [phone] Test Date:  08/02/2017  Patient: Alexis Grant DOB: 1950/11/06 Physician: Narda Amber, DO  Sex: Female Height: 5\' 3"  Ref Phys: Ellouise Newer, MD  ID#: [id number] Temp: 35.9C Technician:    Patient Complaints: This is a 67 year old female referred for evaluation of gait imbalance and falls.  NCV & EMG Findings: Extensive electrodiagnostic testing of the right lower extremity and additional studies of the left is somewhat hampered by body physiognomy. Findings are as follows:  1. Bilateral sural and superficial peroneal sensory responses are absent. 2. Bilateral peroneal and tibial motor responses are within normal limits. 3. Bilateral tibial H reflex studies are absent. 4. There is no evidence of active or chronic motor axon loss changes affecting any of the tested muscles. Motor unit configuration and recruitment pattern is within normal limits.  Impression: The electrophysiologic findings are most consistent with a predominant sensory polyneuropathy affecting the lower extremities.   ___________________________ Narda Amber, DO    Nerve Conduction Studies Anti Sensory Summary Table   Site NR Peak (ms) Norm Peak (ms) P-T Amp (V) Norm P-T Amp  Left Sup Peroneal Anti Sensory (Ant Lat Mall)  35.6C  12 cm NR  <4.6  >3  Right Sup Peroneal Anti Sensory (Ant Lat Mall)  35.6C  12 cm NR  <4.6  >3  Left Sural Anti Sensory (Lat Mall)  35.6C  Calf NR  <4.6  >3  Right Sural Anti Sensory (Lat Mall)  35.6C  Calf NR  <4.6  >3   Motor Summary Table   Site NR Onset (ms) Norm Onset (ms) O-P Amp (mV) Norm O-P Amp Site1 Site2 Delta-0 (ms) Dist (cm) Vel (m/s) Norm Vel (m/s)  Left Peroneal Motor (Ext Dig Brev)  35.6C  Ankle    4.3 <6.0 2.9 >2.5 B Fib Ankle 7.8 35.0 45 >40  B Fib    12.1  2.6  Poplt B Fib 1.4 9.0 64 >40  Poplt    13.5  2.3         Right Peroneal Motor  (Ext Dig Brev)  35.6C  Ankle    3.4 <6.0 3.4 >2.5 B Fib Ankle 7.5 38.0 51 >40  B Fib    10.9  3.2  Poplt B Fib 0.7 10.0 143 >40  Poplt    11.6  3.2         Left Tibial Motor (Abd Hall Brev)  35.6C  Ankle    3.8 <6.0 8.2 >4 Knee Ankle 7.8 41.0 53 >40  Knee    11.6  6.4         Right Tibial Motor (Abd Hall Brev)  35.6C  Ankle    4.2 <6.0 8.7 >4 Knee Ankle 8.0 39.0 49 >40  Knee    12.2  7.8          H Reflex Studies   NR H-Lat (ms) Lat Norm (ms) L-R H-Lat (ms)  Left Tibial (Gastroc)  35.6C  NR  <35   Right Tibial (Gastroc)  35.6C  NR  <35    EMG   Side Muscle Ins Act Fibs Psw Fasc Number Recrt Dur Dur. Amp Amp. Poly Poly. Comment  Right AntTibialis Nml Nml Nml Nml Nml Nml Nml Nml Nml Nml Nml Nml N/A  Right Gastroc Nml Nml Nml Nml Nml Nml Nml Nml Nml Nml Nml Nml N/A  Right Flex Dig Long Nml Nml Nml Nml Nml  Nml Nml Nml Nml Nml Nml Nml N/A  Right RectFemoris Nml Nml Nml Nml Nml Nml Nml Nml Nml Nml Nml Nml N/A  Right GluteusMed Nml Nml Nml Nml Nml Nml Nml Nml Nml Nml Nml Nml N/A  Left AntTibialis Nml Nml Nml Nml Nml Nml Nml Nml Nml Nml Nml Nml N/A  Left Gastroc Nml Nml Nml Nml Nml Nml Nml Nml Nml Nml Nml Nml N/A  Left RectFemoris Nml Nml Nml Nml Nml Nml Nml Nml Nml Nml Nml Nml N/A      Waveforms:

## 2017-08-04 ENCOUNTER — Telehealth: Payer: Self-pay

## 2017-08-04 NOTE — Telephone Encounter (Signed)
-----   Message from Cameron Sprang, MD sent at 08/03/2017  2:41 PM EDT ----- Pls let her know the nerve test confirmed the presence of neuropathy in both feet, which can cause imbalance and falls. The treatment is Balance therapy, would proceed if she has not done yet. Thanks

## 2017-08-04 NOTE — Telephone Encounter (Signed)
LMOM relaying message below.  Asked for return call in regards to Balance Therapy.

## 2017-08-11 ENCOUNTER — Encounter (HOSPITAL_COMMUNITY): Payer: Self-pay

## 2017-08-11 ENCOUNTER — Other Ambulatory Visit: Payer: Self-pay

## 2017-08-11 ENCOUNTER — Emergency Department (HOSPITAL_COMMUNITY)
Admission: EM | Admit: 2017-08-11 | Discharge: 2017-08-12 | Disposition: A | Discharge status: Home / Self Care | Payer: Medicare Other | Attending: Emergency Medicine | Admitting: Emergency Medicine

## 2017-08-11 DIAGNOSIS — R112 Nausea with vomiting, unspecified: Secondary | ICD-10-CM | POA: Diagnosis not present

## 2017-08-11 DIAGNOSIS — Z79899 Other long term (current) drug therapy: Secondary | ICD-10-CM | POA: Insufficient documentation

## 2017-08-11 DIAGNOSIS — I10 Essential (primary) hypertension: Secondary | ICD-10-CM | POA: Diagnosis not present

## 2017-08-11 DIAGNOSIS — R42 Dizziness and giddiness: Secondary | ICD-10-CM | POA: Diagnosis not present

## 2017-08-11 DIAGNOSIS — R197 Diarrhea, unspecified: Secondary | ICD-10-CM | POA: Diagnosis not present

## 2017-08-11 DIAGNOSIS — R000 Tachycardia, unspecified: Secondary | ICD-10-CM | POA: Insufficient documentation

## 2017-08-11 DIAGNOSIS — Z7984 Long term (current) use of oral hypoglycemic drugs: Secondary | ICD-10-CM | POA: Diagnosis not present

## 2017-08-11 DIAGNOSIS — E119 Type 2 diabetes mellitus without complications: Secondary | ICD-10-CM | POA: Insufficient documentation

## 2017-08-11 LAB — COMPREHENSIVE METABOLIC PANEL
ALBUMIN: 3.7 g/dL (ref 3.5–5.0)
ALK PHOS: 58 U/L (ref 38–126)
ALT: 12 U/L (ref 0–44)
ANION GAP: 13 (ref 5–15)
AST: 13 U/L — ABNORMAL LOW (ref 15–41)
BUN: 11 mg/dL (ref 8–23)
CALCIUM: 9.1 mg/dL (ref 8.9–10.3)
CO2: 19 mmol/L — AB (ref 22–32)
Chloride: 106 mmol/L (ref 98–111)
Creatinine, Ser: 0.58 mg/dL (ref 0.44–1.00)
GFR calc non Af Amer: >60 mL/min (ref >60)
GLUCOSE: 146 mg/dL — AB (ref 70–99)
Potassium: 3.5 mmol/L (ref 3.5–5.1)
SODIUM: 138 mmol/L (ref 135–145)
Total Bilirubin: 1 mg/dL (ref 0.3–1.2)
Total Protein: 7 g/dL (ref 6.5–8.1)

## 2017-08-11 LAB — CBC
HEMATOCRIT: 47.2 % — AB (ref 36.0–46.0)
HEMOGLOBIN: 16 g/dL — AB (ref 12.0–15.0)
MCH: 29.1 pg (ref 26.0–34.0)
MCHC: 33.9 g/dL (ref 30.0–36.0)
MCV: 85.8 fL (ref 78.0–100.0)
Platelets: 209 10*3/uL (ref 150–400)
RBC: 5.5 MIL/uL — AB (ref 3.87–5.11)
RDW: 14.8 % (ref 11.5–15.5)
WBC: 9 10*3/uL (ref 4.0–10.5)

## 2017-08-11 LAB — CBG MONITORING, ED: GLUCOSE-CAPILLARY: 142 mg/dL — AB (ref 70–99)

## 2017-08-11 LAB — LIPASE, BLOOD: Lipase: 28 U/L (ref 11–51)

## 2017-08-11 MED ORDER — ONDANSETRON HCL 4 MG/2ML IJ SOLN
4.0000 mg | Freq: Once | INTRAMUSCULAR | Status: AC
  Administered 2017-08-11: 4 mg via INTRAVENOUS
  Filled 2017-08-11: qty 2

## 2017-08-11 MED ORDER — SODIUM CHLORIDE 0.9 % IV BOLUS
1000.0000 mL | Freq: Once | INTRAVENOUS | Status: AC
  Administered 2017-08-11: 1000 mL via INTRAVENOUS

## 2017-08-11 MED ORDER — ONDANSETRON 4 MG PO TBDP
4.0000 mg | ORAL_TABLET | Freq: Once | ORAL | Status: AC | PRN
  Administered 2017-08-11: 4 mg via ORAL
  Filled 2017-08-11: qty 1

## 2017-08-11 MED ORDER — SODIUM CHLORIDE 0.9 % IV BOLUS
1000.0000 mL | Freq: Once | INTRAVENOUS | Status: AC
  Administered 2017-08-12: 1000 mL via INTRAVENOUS

## 2017-08-11 NOTE — ED Triage Notes (Signed)
Pt is alert and oriented x 4 and is verbally responsive. Pt reports n/v/d x 5 days and is unable to food and fluids down. Pt is a diabetic and CBG was 142.  Pt reports that she has had some changes to her medications Depakote and Topamax but has not contact PCP regarding her symptoms.  Pt denies pain at this time.       Pt has tremors at baseline. Pt has hx of falling and poor balance.

## 2017-08-11 NOTE — ED Notes (Signed)
Patient is aware a second urine specimen is needed.

## 2017-08-12 DIAGNOSIS — R112 Nausea with vomiting, unspecified: Secondary | ICD-10-CM | POA: Diagnosis not present

## 2017-08-12 LAB — URINALYSIS, ROUTINE W REFLEX MICROSCOPIC
BACTERIA UA: NONE SEEN
BILIRUBIN URINE: NEGATIVE
Glucose, UA: >=500 mg/dL — AB
Hgb urine dipstick: NEGATIVE
KETONES UR: 20 mg/dL — AB
LEUKOCYTES UA: NEGATIVE
NITRITE: NEGATIVE
PROTEIN: NEGATIVE mg/dL
Specific Gravity, Urine: 1.03 (ref 1.005–1.030)
pH: 5 (ref 5.0–8.0)

## 2017-08-12 MED ORDER — ONDANSETRON 4 MG PO TBDP: 4.0000 mg | ORAL_TABLET | Freq: Three times a day (TID) | ORAL | 0 refills | Status: DC | PRN

## 2017-08-12 NOTE — ED Provider Notes (Signed)
[facility] DEPT Provider Note   CSN: [id number] Arrival date & time: 08/11/17  1959     History   Chief Complaint Chief Complaint  Patient presents with  . Emesis  . Nausea  . Diarrhea    HPI Alexis Grant is a 67 y.o. female.  HPI  Alexis Grant is a 67 y.o. female presents to emergency department with complaint of nausea, vomiting, diarrhea.  Patient states her symptoms started 6 days ago.  She states her symptoms began right after her physician switch some of her medications.  She states they decreased her Depakote dose and increased her Topamax which she takes for tremors and seizures.  She is thinks that the side effects of the medications.  She reports watery diarrhea, states she has at least 5 episodes a day.  She reports persistent nausea and vomiting, states unable to keep any solids or liquids down.  She states she feels dizzy and dehydrated.  She has not tried any antiemetics at home.  She denies any abdominal pain.  She denies any back pain.  No urinary symptoms.  Denies any blood in her stool or emesis.  No recent travel.  No exposure to others with similar illness.  No fever or chills.   Past Medical History:  Diagnosis Date  . Adenomatous polyp of colon 2008  . Allergy   . Anemia   . Anxiety   . Arthritis   . Blood transfusion without reported diagnosis    following knee surgery   . Cataract    hx- removed both eyes  . Depression   . Diabetes mellitus    type II   . Dyslipidemia   . Fibromyalgia   . Gallstones   . Gastritis   . GERD (gastroesophageal reflux disease)   . Hemangioma   . Hiatal hernia    Fibromyalgia  . Hyperlipidemia   . Hypertension   . Hypothyroidism 2010   Dr.Dev[place]awr  . IBS (irritable bowel syndrome)    Dr Fuller Plan  . Migraines   . Myocardial infarct ([place]) 2010   subendocardrial, following R TKR  . Obesity   . OSA (obstructive sleep apnea)    cpap - does not know settings   . Pneumonia   .  Seizure disorder ([place])   . Seizures ([place])    last seizure was 2002 per pt 07-29-16  . Sleep apnea   . Urinary tract infection     Patient Active Problem List   Diagnosis Date Noted  . Intractable migraine with status migrainosus 05/25/2017  . Osteoarthritis, hip, bilateral 05/25/2017  . Osteopenia 11/22/2016  . GERD (gastroesophageal reflux disease) 11/15/2016  . Tingling of both feet 11/15/2016  . Difficulty sleeping 05/24/2016  . Rosacea 11/25/2015  . Poor balance 11/25/2015  . Tremor 04/18/2015  . Other fatigue 02/17/2015  . Migraine with aura and without status migrainosus, not intractable 12/04/2014  . Localization-related (focal) (partial) idiopathic epilepsy and epileptic syndromes with seizures of localized onset, not intractable, without status epilepticus ([place]) 12/04/2014  . Hx of adenomatous colonic polyps 07/16/2014  . Benign neoplasm of ascending colon 07/16/2014  . Benign neoplasm of descending colon 07/16/2014  . Benign neoplasm of cecum 07/16/2014  . Benign neoplasm of transverse colon 07/16/2014  . Fibromyalgia 05/30/2013  . Generalized convulsive epilepsy ([place]) 10/20/2012  . Seasonal allergies 12/08/2011  . DM (diabetes mellitus), type 2 with peripheral vascular complications ([facility]) 03/54/6568  . Essential hypertension 01/05/2010  . Irritable bowel syndrome 01/05/2010  .  VERTIGO 09/16/2009  . VITAMIN D DEFICIENCY 02/24/2009  . MYOCARDIAL INFARCTION, ACUTE, SUBENDOCARDIAL 10/04/2008  . Hypothyroidism 03/19/2008  . COLONIC POLYPS, ADENOMATOUS, HX OF 08/07/2007  . HEMANGIOMA 04/26/2007  . Dyslipidemia 04/26/2007  . OBESITY 04/26/2007  . Depression 04/26/2007  . SLEEP APNEA, OBSTRUCTIVE 04/26/2007  . HIATAL HERNIA 04/26/2007  . FATTY LIVER DISEASE 04/26/2007  . ARTHRITIS 04/26/2007    Past Surgical History:  Procedure Laterality Date  . ABDOMINAL HYSTERECTOMY  1988  . CARDIAC CATHETERIZATION  2007   DrGamble ( now seeing Dr Tonita Cong cardiology)  .  CHOLECYSTECTOMY  1985  . COLONOSCOPY    . COLONOSCOPY WITH PROPOFOL N/A 07/16/2014   Procedure: COLONOSCOPY WITH PROPOFOL;  Surgeon: Ladene Artist, MD;  Location: [facility] ENDOSCOPY;  Service: Endoscopy;  Laterality: N/A;  . COLONOSCOPY WITH PROPOFOL N/A 09/21/2016   Procedure: COLONOSCOPY WITH PROPOFOL;  Surgeon: Ladene Artist, MD;  Location: [facility] ENDOSCOPY;  Service: Endoscopy;  Laterality: N/A;  . POLYPECTOMY  2012   6 or 7 adenomatous, Dr.Stark  . steroid injection to left si joint  12/2008   Dr.Newton  . TOTAL KNEE ARTHROPLASTY Right 11/2005  . TOTAL KNEE ARTHROPLASTY Left   . TUBAL LIGATION  1986     OB History   None      Home Medications    Prior to Admission medications   Medication Sig Start Date End Date Taking? Authorizing Provider  acetaminophen-codeine (TYLENOL #3) 300-30 MG tablet TAKE 1 TO 2 TABLETS BY MOUTH AT BEDTIME FOR CHRONIC HIP PAIN 07/29/17  Yes Binnie Rail, MD  divalproex (DEPAKOTE) 500 MG DR tablet Take 1 tablet twice a day 07/05/17  Yes Cameron Sprang, MD  Dulaglutide (TRULICITY) 1.5 KP/5.4SF SOPN Inject 1.5 mg into the skin once a week. 06/03/17  Yes Burns, Claudina Lick, MD  DULoxetine (CYMBALTA) 60 MG capsule Take 1 capsule (60 mg total) by mouth daily. 06/27/17  Yes Burns, Claudina Lick, MD  empagliflozin (JARDIANCE) 25 MG TABS tablet Take 25 mg by mouth daily. 06/16/17  Yes Burns, Claudina Lick, MD  levothyroxine (SYNTHROID, LEVOTHROID) 75 MCG tablet Take 1 tablet (75 mcg total) by mouth daily before breakfast. 04/25/17  Yes Burns, Claudina Lick, MD  losartan (COZAAR) 50 MG tablet TAKE 1 TABLET BY MOUTH ONCE DAILY 01/14/17  Yes Burns, Claudina Lick, MD  metFORMIN (GLUCOPHAGE) 1000 MG tablet TAKE 1 TABLET BY MOUTH ONCE DAILY WITH BREAKFAST 03/03/17  Yes Burns, Claudina Lick, MD  omeprazole (PRILOSEC) 40 MG capsule Take 1 capsule (40 mg total) by mouth daily. 11/15/16  Yes Burns, Claudina Lick, MD  simvastatin (ZOCOR) 20 MG tablet TAKE 1 TABLET BY MOUTH AT BEDTIME 01/19/17  Yes Burns, Claudina Lick, MD    topiramate (TOPAMAX) 25 MG tablet Take 1 tablet in AM, 2 tablets in PM 07/05/17  Yes Cameron Sprang, MD  traZODone (DESYREL) 50 MG tablet TAKE 2 TABLETS BY MOUTH AT BEDTIME FOR SLEEP 06/03/17  Yes Burns, Claudina Lick, MD  simvastatin (ZOCOR) 20 MG tablet TAKE 1 TABLET BY MOUTH AT BEDTIME Patient not taking: Reported on 08/11/2017 07/29/17   Binnie Rail, MD  Cholecalciferol (VITAMIN D) 2000 UNITS CAPS Take by mouth.    01/18/97  [provider]    Family History Family History  Problem Relation Age of Onset  . Colon polyps Mother   . Ulcers Father   . Colon polyps Brother   . Diabetes Sister   . Diabetes Paternal Aunt   . Esophageal cancer Brother   .  Kidney cancer Sister   . Colon cancer Neg Hx   . Heart attack Neg Hx   . Stroke Neg Hx   . Cancer Neg Hx   . Gallbladder disease Neg Hx   . Rectal cancer Neg Hx   . Stomach cancer Neg Hx     Social History Social History   Tobacco Use  . Smoking status: Never Smoker  . Smokeless tobacco: Never Used  Substance Use Topics  . Alcohol use: No    Alcohol/week: 0.0 oz  . Drug use: No     Allergies   Hydrocodone-acetaminophen   Review of Systems Review of Systems  Constitutional: Negative for chills and fever.  Respiratory: Negative for cough, chest tightness and shortness of breath.   Cardiovascular: Negative for chest pain, palpitations and leg swelling.  Gastrointestinal: Positive for diarrhea, nausea and vomiting. Negative for abdominal pain.  Genitourinary: Negative for dysuria, flank pain, pelvic pain, vaginal bleeding, vaginal discharge and vaginal pain.  Musculoskeletal: Negative for arthralgias, myalgias, neck pain and neck stiffness.  Skin: Negative for rash.  Neurological: Positive for dizziness, weakness and light-headedness. Negative for headaches.  All other systems reviewed and are negative.    Physical Exam Updated Vital Signs BP 102/79 (BP Location: Right Arm)   Pulse (!) 108   Temp 97.6 F (36.4  C) (Oral)   Resp 16   Ht 5\' 3"  (1.6 m)   Wt 127 kg (280 lb)   SpO2 98%   BMI 49.60 kg/m   Physical Exam  Constitutional: She is oriented to person, place, and time. She appears well-developed and well-nourished. No distress.  HENT:  Head: Normocephalic.  Oral mucosa is dry  Eyes: Conjunctivae are normal.  Neck: Neck supple.  Cardiovascular: Regular rhythm and normal heart sounds.  Tachycardic  Pulmonary/Chest: Effort normal and breath sounds normal. No respiratory distress. She has no wheezes. She has no rales.  Abdominal: Soft. Bowel sounds are normal. She exhibits no distension. There is no tenderness. There is no rebound.  Musculoskeletal: She exhibits no edema.  Neurological: She is alert and oriented to person, place, and time.  Skin: Skin is warm and dry.  Psychiatric: She has a normal mood and affect. Her behavior is normal.  Nursing note and vitals reviewed.    ED Treatments / Results  Labs (all labs ordered are listed, but only abnormal results are displayed) Labs Reviewed  COMPREHENSIVE METABOLIC PANEL - Abnormal; Notable for the following components:      Result Value   CO2 19 (*)    Glucose, Bld 146 (*)    AST 13 (*)    All other components within normal limits  CBC - Abnormal; Notable for the following components:   RBC 5.50 (*)    Hemoglobin 16.0 (*)    HCT 47.2 (*)    All other components within normal limits  CBG MONITORING, ED - Abnormal; Notable for the following components:   Glucose-Capillary 142 (*)    All other components within normal limits  LIPASE, BLOOD  URINALYSIS, ROUTINE W REFLEX MICROSCOPIC    EKG None  Radiology No results found.  Procedures Procedures (including critical care time)  Medications Ordered in ED Medications  sodium chloride 0.9 % bolus 1,000 mL (has no administration in time range)  ondansetron (ZOFRAN-ODT) disintegrating tablet 4 mg (4 mg Oral Given 08/11/17 2024)  sodium chloride 0.9 % bolus 1,000 mL (1,000  mLs Intravenous New Bag/Given 08/11/17 2222)  ondansetron (ZOFRAN) injection 4 mg (4 mg Intravenous  Given 08/11/17 2221)     Initial Impression / Assessment and Plan / ED Course  I have reviewed the triage vital signs and the nursing notes.  Pertinent labs & imaging results that were available during my care of the patient were reviewed by me and considered in my medical decision making (see chart for details).     Patient in emergency department nausea, vomiting, diarrhea.  She has no abdominal pain or tenderness on the exam at all.  Advised will check labs, start IV fluids for hydration, she does appear to be dry.  Will try Zofran for nausea and vomiting.  Will check urinalysis to make sure she does not have a UTI.  Also advised that she should give [place] a stool sample if she is able to send for cultures and C. Difficile.   Patient feels better after Zofran, she would like to try some ice chips and if that goes well we will try some ginger ale.  Her labs are all unremarkable.  She is unable to give [place] any urine, she apparently did give [place] a sample but it was too little to run a urine analysis.  I suspect this is most likely due to dehydration we will continue to hydrate her.  Again no abdominal pain or tenderness, do not think she needs any imaging.  12:28 AM Patient continues to feel well and already working on her second cup of ginger ale.  She only received 1 L of saline at this time.  She just started on her second liter.  She is still mildly tachycardic.  She unable to give [place] any urine.  I discussed patient with PA Charlann Lange who will follow up on urine analysis and recheck her after her second liter.  If patient is able to still drink with no vomiting, she is okay to follow-up with her family doctor and okay to go home with prescription for zofran.  Pt agreeable to theplan.  She has not been able to give [place] any stool at this time.    Final Clinical Impressions(s) / ED Diagnoses    Final diagnoses:  None    ED Discharge Orders    None       Jeannett Senior, PA-C 08/12/17 0030    Daleen Bo, MD 08/13/17 [phone]

## 2017-08-12 NOTE — ED Notes (Signed)
Discharge instructions reviewed with pt. Pt verbalized understanding. Pt to follow up with PCP. PIV removed. Pt ambulatory to waiting room.

## 2017-08-12 NOTE — ED Provider Notes (Addendum)
N, V, D x 6 days No pain or tenderness Recent medication changes ? Related to meds - doubt Getting hydrated with 2L  ODT zofran, IV zofran - drinking PO fluids now, tolerating.   Plan - re-evaluate after fluids, check urine when collected. Anticipate discharge home. Rx Zofran  Patient feeling much better after fluids. She has been drinking fluids without further vomiting. UA still fairly concentrated. Encouraged PO fluids at home. She can be discharged home per plan of previous treatment team.    Charlann Lange, PA-C 08/12/17 0145    Charlann Lange, PA-C 08/12/17 0214    Daleen Bo, MD 08/13/17 [phone]

## 2017-08-12 NOTE — ED Notes (Signed)
Patient ambulated in hallway well. No assistance needed.

## 2017-08-12 NOTE — Discharge Instructions (Addendum)
Push fluids. Take Zofran for nausea as directed. Recommend Imodium if having more than 5 bowel movements daily. Return to the ED with any worsening symptoms - severe pain, high fever, uncontrolled vomiting - or for new concern.

## 2017-08-15 ENCOUNTER — Telehealth: Payer: Self-pay | Admitting: Internal Medicine

## 2017-08-15 NOTE — Telephone Encounter (Signed)
Copied from [place] [phone]. Topic: Inquiry >> Aug 15, 2017  9:49 AM Pricilla Handler wrote: Reason for CRM: Patient called requesting that Dr. Quay Burow complete a letter regarding her mobility issue. Patient has been summoned to jury duty, but cannot attend due to her mobility issues. Please call patient to discuss.       Thank You!!!

## 2017-08-16 ENCOUNTER — Encounter: Payer: Self-pay | Admitting: Emergency Medicine

## 2017-08-16 NOTE — Telephone Encounter (Signed)
Spoke with pt to get Alexis Grant Duty date of, Aug 8th. 2019. Pt will come by office and pick up completed letter placed up-front.

## 2017-08-27 ENCOUNTER — Other Ambulatory Visit: Payer: Self-pay | Admitting: Internal Medicine

## 2017-08-29 ENCOUNTER — Ambulatory Visit: Payer: Self-pay | Admitting: Internal Medicine

## 2017-08-29 NOTE — Telephone Encounter (Signed)
[place] Controlled Substance Database checked. Last filled on 07/29/17

## 2017-08-29 NOTE — Telephone Encounter (Signed)
Patient is calling with symptoms of UTI- appointment for evaluation scheduled.  Reason for Disposition . Pain or burning with passing urine  Answer Assessment - Initial Assessment Questions 1. COLOR of URINE: "Describe the color of the urine."  (e.g., tea-colored, pink, red, blood clots, bloody)     Clear to light yellow- coloring urine with red- no clots 2. ONSET: "When did the bleeding start?"      Around 2 o'clock today 3. EPISODES: "How many times has there been blood in the urine?" or "How many times today?"     3-4 times- patient has had frequency and urgency  4. PAIN with URINATION: "Is there any pain with passing your urine?" If so, ask: "How bad is the pain?"  (Scale 1-10; or mild, moderate, severe)    - MILD - complains slightly about urination hurting    - MODERATE - interferes with normal activities      - SEVERE - excruciating, unwilling or unable to urinate because of the pain      Yes- 5-6 5. FEVER: "Do you have a fever?" If so, ask: "What is your temperature, how was it measured, and when did it start?"     No fever 6. ASSOCIATED SYMPTOMS: "Are you passing urine more frequently than usual?"     Frequency and urgency 7. OTHER SYMPTOMS: "Do you have any other symptoms?" (e.g., back/flank pain, abdominal pain, vomiting)     no 8. PREGNANCY: "Is there any chance you are pregnant?" "When was your last menstrual period?"     n/a  Protocols used: URINE - BLOOD IN-A-AH

## 2017-08-30 ENCOUNTER — Ambulatory Visit (INDEPENDENT_AMBULATORY_CARE_PROVIDER_SITE_OTHER): Payer: Medicare Other | Admitting: Family

## 2017-08-30 ENCOUNTER — Encounter: Payer: Self-pay | Admitting: Family

## 2017-08-30 ENCOUNTER — Ambulatory Visit (INDEPENDENT_AMBULATORY_CARE_PROVIDER_SITE_OTHER)
Admission: RE | Admit: 2017-08-30 | Discharge: 2017-08-30 | Disposition: A | Discharge status: Home / Self Care | Payer: Medicare Other | Source: Ambulatory Visit | Attending: Family | Admitting: Family

## 2017-08-30 ENCOUNTER — Other Ambulatory Visit: Payer: Medicare Other

## 2017-08-30 VITALS — BP 106/68 | HR 93 | Temp 98.0°F | Ht 63.0 in | Wt 274.0 lb

## 2017-08-30 DIAGNOSIS — R319 Hematuria, unspecified: Secondary | ICD-10-CM | POA: Diagnosis not present

## 2017-08-30 DIAGNOSIS — R109 Unspecified abdominal pain: Secondary | ICD-10-CM

## 2017-08-30 LAB — POC URINALSYSI DIPSTICK (AUTOMATED)
Bilirubin, UA: NEGATIVE
GLUCOSE UA: POSITIVE — AB
Ketones, UA: NEGATIVE
LEUKOCYTES UA: NEGATIVE
NITRITE UA: NEGATIVE
Protein, UA: POSITIVE — AB
RBC UA: POSITIVE
Spec Grav, UA: 1.02 (ref 1.010–1.025)
UROBILINOGEN UA: 0.2 U/dL
pH, UA: 6 (ref 5.0–8.0)

## 2017-08-30 MED ORDER — NITROFURANTOIN MONOHYD MACRO 100 MG PO CAPS: 100.0000 mg | ORAL_CAPSULE | Freq: Two times a day (BID) | ORAL | 0 refills | Status: DC

## 2017-08-30 NOTE — Progress Notes (Signed)
Alexis Grant is a 67 y.o. female with the following history as recorded in EpicCare:  Patient Active Problem List   Diagnosis Date Noted  . Intractable migraine with status migrainosus 05/25/2017  . Osteoarthritis, hip, bilateral 05/25/2017  . Osteopenia 11/22/2016  . GERD (gastroesophageal reflux disease) 11/15/2016  . Tingling of both feet 11/15/2016  . Difficulty sleeping 05/24/2016  . Rosacea 11/25/2015  . Poor balance 11/25/2015  . Tremor 04/18/2015  . Other fatigue 02/17/2015  . Migraine with aura and without status migrainosus, not intractable 12/04/2014  . Localization-related (focal) (partial) idiopathic epilepsy and epileptic syndromes with seizures of localized onset, not intractable, without status epilepticus ([place]) 12/04/2014  . Hx of adenomatous colonic polyps 07/16/2014  . Benign neoplasm of ascending colon 07/16/2014  . Benign neoplasm of descending colon 07/16/2014  . Benign neoplasm of cecum 07/16/2014  . Benign neoplasm of transverse colon 07/16/2014  . Fibromyalgia 05/30/2013  . Generalized convulsive epilepsy ([place]) 10/20/2012  . Seasonal allergies 12/08/2011  . DM (diabetes mellitus), type 2 with peripheral vascular complications ([place]) 85/46/2703  . Essential hypertension 01/05/2010  . Irritable bowel syndrome 01/05/2010  . VERTIGO 09/16/2009  . VITAMIN D DEFICIENCY 02/24/2009  . MYOCARDIAL INFARCTION, ACUTE, SUBENDOCARDIAL 10/04/2008  . Hypothyroidism 03/19/2008  . COLONIC POLYPS, ADENOMATOUS, HX OF 08/07/2007  . HEMANGIOMA 04/26/2007  . Dyslipidemia 04/26/2007  . OBESITY 04/26/2007  . Depression 04/26/2007  . SLEEP APNEA, OBSTRUCTIVE 04/26/2007  . HIATAL HERNIA 04/26/2007  . FATTY LIVER DISEASE 04/26/2007  . ARTHRITIS 04/26/2007    Current Outpatient Medications  Medication Sig Dispense Refill  . acetaminophen-codeine (TYLENOL #3) 300-30 MG tablet TAKE 1 TO 2 TABLETS BY MOUTH AT BEDTIME FOR  CHRONIC  HIP  PAIN 60 tablet 0  . divalproex (DEPAKOTE)  500 MG DR tablet Take 1 tablet twice a day 60 tablet 11  . Dulaglutide (TRULICITY) 1.5 JK/0.9FG SOPN Inject 1.5 mg into the skin once a week. 4 pen 5  . DULoxetine (CYMBALTA) 60 MG capsule Take 1 capsule (60 mg total) by mouth daily. 90 capsule 1  . empagliflozin (JARDIANCE) 25 MG TABS tablet Take 25 mg by mouth daily. 90 tablet 1  . levothyroxine (SYNTHROID, LEVOTHROID) 75 MCG tablet Take 1 tablet (75 mcg total) by mouth daily before breakfast. 90 tablet 1  . losartan (COZAAR) 50 MG tablet TAKE 1 TABLET BY MOUTH ONCE DAILY 90 tablet 1  . metFORMIN (GLUCOPHAGE) 1000 MG tablet TAKE 1 TABLET BY MOUTH ONCE DAILY WITH BREAKFAST 90 tablet 1  . omeprazole (PRILOSEC) 40 MG capsule Take 1 capsule (40 mg total) by mouth daily. 90 capsule 1  . ondansetron (ZOFRAN ODT) 4 MG disintegrating tablet Take 1 tablet (4 mg total) by mouth every 8 (eight) hours as needed for nausea or vomiting. 20 tablet 0  . simvastatin (ZOCOR) 20 MG tablet TAKE 1 TABLET BY MOUTH AT BEDTIME 90 tablet 1  . topiramate (TOPAMAX) 25 MG tablet Take 1 tablet in AM, 2 tablets in PM 90 tablet 11  . traZODone (DESYREL) 50 MG tablet TAKE 2 TABLETS BY MOUTH AT BEDTIME FOR SLEEP 180 tablet 1  . [DISCONTINUED] Cholecalciferol (VITAMIN D) 2000 UNITS CAPS Take by mouth.       No current facility-administered medications for this visit.     Allergies: Hydrocodone-acetaminophen  Past Medical History:  Diagnosis Date  . Adenomatous polyp of colon 2008  . Allergy   . Anemia   . Anxiety   . Arthritis   . Blood transfusion without reported  diagnosis    following knee surgery   . Cataract    hx- removed both eyes  . Depression   . Diabetes mellitus    type II   . Dyslipidemia   . Fibromyalgia   . Gallstones   . Gastritis   . GERD (gastroesophageal reflux disease)   . Hemangioma   . Hiatal hernia    Fibromyalgia  . Hyperlipidemia   . Hypertension   . Hypothyroidism 2010   Dr.Deveshawr  . IBS (irritable bowel syndrome)    Dr Fuller Plan   . Migraines   . Myocardial infarct ([place]) 2010   subendocardrial, following R TKR  . Obesity   . OSA (obstructive sleep apnea)    cpap - does not know settings   . Pneumonia   . Seizure disorder ([place])   . Seizures ([place])    last seizure was 2002 per pt 07-29-16  . Sleep apnea   . Urinary tract infection     Past Surgical History:  Procedure Laterality Date  . ABDOMINAL HYSTERECTOMY  1988  . CARDIAC CATHETERIZATION  2007   DrGamble ( now seeing Dr Tonita Cong cardiology)  . CHOLECYSTECTOMY  1985  . COLONOSCOPY    . COLONOSCOPY WITH PROPOFOL N/A 07/16/2014   Procedure: COLONOSCOPY WITH PROPOFOL;  Surgeon: Ladene Artist, MD;  Location: [facility];  Service: Endoscopy;  Laterality: N/A;  . COLONOSCOPY WITH PROPOFOL N/A 09/21/2016   Procedure: COLONOSCOPY WITH PROPOFOL;  Surgeon: Ladene Artist, MD;  Location: [facility];  Service: Endoscopy;  Laterality: N/A;  . POLYPECTOMY  2012   6 or 7 adenomatous, Dr.Stark  . steroid injection to left si joint  12/2008   Dr.Newton  . TOTAL KNEE ARTHROPLASTY Right 11/2005  . TOTAL KNEE ARTHROPLASTY Left   . TUBAL LIGATION  1986    Family History  Problem Relation Age of Onset  . Colon polyps Mother   . Ulcers Father   . Colon polyps Brother   . Diabetes Sister   . Diabetes Paternal Aunt   . Esophageal cancer Brother   . Kidney cancer Sister   . Colon cancer Neg Hx   . Heart attack Neg Hx   . Stroke Neg Hx   . Cancer Neg Hx   . Gallbladder disease Neg Hx   . Rectal cancer Neg Hx   . Stomach cancer Neg Hx     Social History   Tobacco Use  . Smoking status: Never Smoker  . Smokeless tobacco: Never Used  Substance Use Topics  . Alcohol use: No    Alcohol/week: 0.0 standard drinks    Subjective:  Patient presents with concerns for 1 day history of UTI; + burning on urination; + blood in urine- notes that was passing "large clots" yesterday; has not seen any blood today; no fever; + low back pain/ right flank pain- notes that  pain was very noticeable last night- "just could not get comfortable;" not prone to urinary tract infections; no prior history of kidney stones;    Objective:  Vitals:   08/30/17 0827  BP: 106/68  Pulse: 93  Temp: 98 F (36.7 C)  TempSrc: Oral  SpO2: 96%  Weight: 274 lb 0.6 oz (124.3 kg)  Height: 5\' 3"  (1.6 m)    General: Well developed, well nourished, in no acute distress  Skin : Warm and dry.  Head: Normocephalic and atraumatic  Lungs: Respirations unlabored;  Abdomen: Soft; nontender; nondistended; normoactive bowel sounds; no masses or hepatosplenomegaly  Musculoskeletal: No deformities;  no active joint inflammation' right CVA tenderness  Extremities: No edema, cyanosis, clubbing  Vessels: Symmetric bilaterally  Neurologic: Alert and oriented; speech intact; face symmetrical; moves all extremities well; CNII-XII intact without focal deficit  Assessment:  1. Hematuria, unspecified type   2. Abdominal pain, unspecified abdominal location     Plan:  Due to amount of pain/ blood seen on U/A, am concerned for renal stone; will get STAT CT today; follow-up to be determined.   No follow-ups on file.  Orders Placed This Encounter  Procedures  . Urine Culture    Standing Status:   Future    Standing Expiration Date:   08/30/2018  . CT RENAL STONE STUDY    [facility] [phone]/ [facility] NPR     Standing Status:   Future    Standing Expiration Date:   12/01/2018    Order Specific Question:   Preferred imaging location?    Answer:   [place]    Order Specific Question:   Radiology Contrast Protocol - do NOT remove file path    Answer:   [email or web address]    Requested Prescriptions    No prescriptions requested or ordered in this encounter

## 2017-08-31 ENCOUNTER — Other Ambulatory Visit: Payer: Self-pay | Admitting: Family

## 2017-08-31 DIAGNOSIS — R310 Gross hematuria: Secondary | ICD-10-CM

## 2017-08-31 LAB — URINE CULTURE
MICRO NUMBER:: 90958981
SPECIMEN QUALITY:: ADEQUATE

## 2017-09-05 ENCOUNTER — Ambulatory Visit: Payer: Medicare Other | Admitting: Neurology

## 2017-09-05 ENCOUNTER — Encounter

## 2017-09-20 ENCOUNTER — Other Ambulatory Visit: Payer: Self-pay | Admitting: Internal Medicine

## 2017-09-25 NOTE — Progress Notes (Signed)
80   Subjective:    Patient ID: Alexis Grant, female    DOB: [birth year], 67 y.o.   MRN: [id number]  HPI The patient is here for follow up.  Depression:  She sleeps all the time and has no energy to do anything.  She is concerned it is depression.  She is unsure why she would be depressed.  She is not motivated to do anything.  She has anhedonia.  She tries to get out of doing things.  She is taking the cymbalta daily.  She snacks and does not eat normal meals - her appetite is decreased. She has lost weight. Her sleep is good, but she sleeps too much.   Diabetes: She is taking her medication daily as prescribed. She is somewhat compliant with a diabetic diet. She is not exercising regularly.  She checks her feet daily and denies foot lesions. She is up-to-date with an ophthalmology examination.   Hypertension: She is taking her medication daily. She is compliant with a low sodium diet.  She denies chest pain, palpitations, edema, shortness of breath and regular headaches. She is not exercising regularly.     Hypothyroidism:  She is taking her medication daily.  Her weight has decreased, but she is not eating a much due to uncontrolled depression.  Her energy level is poor.    GERD:  She is taking her medication daily as prescribed.  She denies any GERD symptoms and feels her GERD is well controlled.   Hyperlipidemia: She is taking her medication daily. She is compliant with a low fat/cholesterol diet. She is not exercising regularly. She denies myalgias.   Bilateral hip OA, severe:  She does not want to have surgery.  She is taking the tylenol with codeine at bedtime.  It does help her pain and she denies side effects.  She is happy with her dose and wants to continue the medication.  She did have some blood in urine, but actually thinks it was from a sore in her genital area.  She wears depends and thinks she just had irritation there, which continues to happen.  She did look in the area  with a mirror and saw irritation or an ulcer.  It was bleeding from that area.  She denies any blood at this point.  She denies any urinary symptoms.   Medications and allergies reviewed with patient and updated if appropriate.  Patient Active Problem List   Diagnosis Date Noted  . Intractable migraine with status migrainosus 05/25/2017  . Osteoarthritis, hip, bilateral 05/25/2017  . Osteopenia 11/22/2016  . GERD (gastroesophageal reflux disease) 11/15/2016  . Tingling of both feet 11/15/2016  . Difficulty sleeping 05/24/2016  . Rosacea 11/25/2015  . Poor balance 11/25/2015  . Tremor 04/18/2015  . Other fatigue 02/17/2015  . Migraine with aura and without status migrainosus, not intractable 12/04/2014  . Localization-related (focal) (partial) idiopathic epilepsy and epileptic syndromes with seizures of localized onset, not intractable, without status epilepticus ([place]) 12/04/2014  . Hx of adenomatous colonic polyps 07/16/2014  . Benign neoplasm of ascending colon 07/16/2014  . Benign neoplasm of descending colon 07/16/2014  . Benign neoplasm of cecum 07/16/2014  . Benign neoplasm of transverse colon 07/16/2014  . Fibromyalgia 05/30/2013  . Generalized convulsive epilepsy ([place]) 10/20/2012  . Seasonal allergies 12/08/2011  . DM (diabetes mellitus), type 2 with peripheral vascular complications ([place]) 93/26/7124  . Essential hypertension 01/05/2010  . Irritable bowel syndrome 01/05/2010  . VERTIGO 09/16/2009  . VITAMIN D DEFICIENCY  02/24/2009  . MYOCARDIAL INFARCTION, ACUTE, SUBENDOCARDIAL 10/04/2008  . Hypothyroidism 03/19/2008  . COLONIC POLYPS, ADENOMATOUS, HX OF 08/07/2007  . HEMANGIOMA 04/26/2007  . Dyslipidemia 04/26/2007  . OBESITY 04/26/2007  . Depression 04/26/2007  . SLEEP APNEA, OBSTRUCTIVE 04/26/2007  . HIATAL HERNIA 04/26/2007  . FATTY LIVER DISEASE 04/26/2007  . ARTHRITIS 04/26/2007    Current Outpatient Medications on File Prior to Visit  Medication Sig  Dispense Refill  . acetaminophen-codeine (TYLENOL #3) 300-30 MG tablet TAKE 1 TO 2 TABLETS BY MOUTH AT BEDTIME FOR  CHRONIC  HIP  PAIN 60 tablet 0  . divalproex (DEPAKOTE) 500 MG DR tablet Take 1 tablet twice a day 60 tablet 11  . Dulaglutide (TRULICITY) 1.5 KG/2.5KY SOPN Inject 1.5 mg into the skin once a week. 4 pen 5  . DULoxetine (CYMBALTA) 60 MG capsule Take 1 capsule (60 mg total) by mouth daily. 90 capsule 1  . empagliflozin (JARDIANCE) 25 MG TABS tablet Take 25 mg by mouth daily. 90 tablet 1  . levothyroxine (SYNTHROID, LEVOTHROID) 75 MCG tablet Take 1 tablet (75 mcg total) by mouth daily before breakfast. 90 tablet 1  . losartan (COZAAR) 50 MG tablet TAKE 1 TABLET BY MOUTH ONCE DAILY 90 tablet 1  . metFORMIN (GLUCOPHAGE) 1000 MG tablet TAKE 1 TABLET BY MOUTH ONCE DAILY WITH BREAKFAST 90 tablet 1  . omeprazole (PRILOSEC) 40 MG capsule TAKE 1 CAPSULE BY MOUTH ONCE DAILY 90 capsule 1  . ondansetron (ZOFRAN ODT) 4 MG disintegrating tablet Take 1 tablet (4 mg total) by mouth every 8 (eight) hours as needed for nausea or vomiting. 20 tablet 0  . simvastatin (ZOCOR) 20 MG tablet TAKE 1 TABLET BY MOUTH AT BEDTIME 90 tablet 1  . topiramate (TOPAMAX) 25 MG tablet Take 1 tablet in AM, 2 tablets in PM 90 tablet 11  . traZODone (DESYREL) 50 MG tablet TAKE 2 TABLETS BY MOUTH AT BEDTIME FOR SLEEP 180 tablet 1  . [DISCONTINUED] Cholecalciferol (VITAMIN D) 2000 UNITS CAPS Take by mouth.       No current facility-administered medications on file prior to visit.     Past Medical History:  Diagnosis Date  . Adenomatous polyp of colon 2008  . Allergy   . Anemia   . Anxiety   . Arthritis   . Blood transfusion without reported diagnosis    following knee surgery   . Cataract    hx- removed both eyes  . Depression   . Diabetes mellitus    type II   . Dyslipidemia   . Fibromyalgia   . Gallstones   . Gastritis   . GERD (gastroesophageal reflux disease)   . Hemangioma   . Hiatal hernia     Fibromyalgia  . Hyperlipidemia   . Hypertension   . Hypothyroidism 2010   Dr.Deveshawr  . IBS (irritable bowel syndrome)    Dr Fuller Plan  . Migraines   . Myocardial infarct ([place]) 2010   subendocardrial, following R TKR  . Obesity   . OSA (obstructive sleep apnea)    cpap - does not know settings   . Pneumonia   . Seizure disorder ([place])   . Seizures ([place])    last seizure was 2002 per pt 07-29-16  . Sleep apnea   . Urinary tract infection     Past Surgical History:  Procedure Laterality Date  . ABDOMINAL HYSTERECTOMY  1988  . CARDIAC CATHETERIZATION  2007   DrGamble ( now seeing Dr Tonita Cong cardiology)  . CHOLECYSTECTOMY  1985  .  COLONOSCOPY    . COLONOSCOPY WITH PROPOFOL N/A 07/16/2014   Procedure: COLONOSCOPY WITH PROPOFOL;  Surgeon: Ladene Artist, MD;  Location[facility];  Service: Endoscopy;  Laterality: N/A;  . COLONOSCOPY WITH PROPOFOL N/A 09/21/2016   Procedure: COLONOSCOPY WITH PROPOFOL;  Surgeon: Ladene Artist, MD;  Location[facility];  Service: Endoscopy;  Laterality: N/A;  . POLYPECTOMY  2012   6 or 7 adenomatous, Dr.Stark  . steroid injection to left si joint  12/2008   Dr.Newton  . TOTAL KNEE ARTHROPLASTY Right 11/2005  . TOTAL KNEE ARTHROPLASTY Left   . TUBAL LIGATION  1986    Social History   Socioeconomic History  . Marital status: Widowed    Spouse name: Not on file  . Number of children: 3  . Years of education: college  . Highest education level: Not on file  Occupational History  . Occupation: Retired  Scientific laboratory technician  . Financial resource strain: Not on file  . Food insecurity:    Worry: Not on file    Inability: Not on file  . Transportation needs:    Medical: Not on file    Non-medical: Not on file  Tobacco Use  . Smoking status: Never Smoker  . Smokeless tobacco: Never Used  Substance and Sexual Activity  . Alcohol use: No    Alcohol/week: 0.0 standard drinks  . Drug use: No  . Sexual activity: Not on file  Lifestyle  .  Physical activity:    Days per week: Not on file    Minutes per session: Not on file  . Stress: Not on file  Relationships  . Social connections:    Talks on phone: Not on file    Gets together: Not on file    Attends religious service: Not on file    Active member of club or organization: Not on file    Attends meetings of clubs or organizations: Not on file    Relationship status: Not on file  Other Topics Concern  . Not on file  Social History Narrative   Illicit drug use- no   Patient does not get regular exercise due to knee.   Patient lives at home alone.    Patient son and wife lives next door.   Patient has 3 children.    Patient has some college.    Patient retired May 2014.     Family History  Problem Relation Age of Onset  . Colon polyps Mother   . Ulcers Father   . Colon polyps Brother   . Diabetes Sister   . Diabetes Paternal Aunt   . Esophageal cancer Brother   . Kidney cancer Sister   . Colon cancer Neg Hx   . Heart attack Neg Hx   . Stroke Neg Hx   . Cancer Neg Hx   . Gallbladder disease Neg Hx   . Rectal cancer Neg Hx   . Stomach cancer Neg Hx     Review of Systems  Constitutional: Positive for appetite change (decreased). Negative for chills and fever.  Respiratory: Negative for cough, shortness of breath and wheezing.   Cardiovascular: Negative for chest pain, palpitations and leg swelling.  Neurological: Positive for dizziness, light-headedness, numbness (a little in feet) and headaches.  Psychiatric/Behavioral: Positive for dysphoric mood and sleep disturbance. Negative for suicidal ideas. The patient is not nervous/anxious.        Objective:   Vitals:   09/27/17 0947  BP: 118/72  Pulse: 83  Resp:  16  Temp: 99 F (37.2 C)  SpO2: 97%   BP Readings from Last 3 Encounters:  09/27/17 118/72  08/30/17 106/68  08/12/17 (!) 101/51   Wt Readings from Last 3 Encounters:  09/27/17 274 lb (124.3 kg)  08/30/17 274 lb 0.6 oz (124.3 kg)    08/11/17 280 lb (127 kg)   Body mass index is 48.54 kg/m.   Physical Exam    Constitutional: Appears well-developed and well-nourished. No distress.  HENT:  Head: Normocephalic and atraumatic.  Neck: Neck supple. No tracheal deviation present. No thyromegaly present.  No cervical lymphadenopathy Cardiovascular: Normal rate, regular rhythm and normal heart sounds.   No murmur heard. No carotid bruit .  No edema Pulmonary/Chest: Effort normal and breath sounds normal. No respiratory distress. No has no wheezes. No rales.  Skin: Skin is warm and dry. Not diaphoretic.  Psychiatric: depressed mood and affect. Behavior is normal.      Assessment & Plan:    See Problem List for Assessment and Plan of chronic medical problems.

## 2017-09-26 ENCOUNTER — Other Ambulatory Visit: Payer: Self-pay | Admitting: Internal Medicine

## 2017-09-27 ENCOUNTER — Encounter: Payer: Self-pay | Admitting: Internal Medicine

## 2017-09-27 ENCOUNTER — Ambulatory Visit (INDEPENDENT_AMBULATORY_CARE_PROVIDER_SITE_OTHER): Payer: Medicare Other | Admitting: Internal Medicine

## 2017-09-27 ENCOUNTER — Other Ambulatory Visit (INDEPENDENT_AMBULATORY_CARE_PROVIDER_SITE_OTHER): Payer: Medicare Other

## 2017-09-27 VITALS — BP 118/72 | HR 83 | Temp 99.0°F | Resp 16 | Ht 63.0 in | Wt 274.0 lb

## 2017-09-27 DIAGNOSIS — K219 Gastro-esophageal reflux disease without esophagitis: Secondary | ICD-10-CM | POA: Diagnosis not present

## 2017-09-27 DIAGNOSIS — G629 Polyneuropathy, unspecified: Secondary | ICD-10-CM | POA: Diagnosis not present

## 2017-09-27 DIAGNOSIS — F32A Depression, unspecified: Secondary | ICD-10-CM

## 2017-09-27 DIAGNOSIS — Z23 Encounter for immunization: Secondary | ICD-10-CM

## 2017-09-27 DIAGNOSIS — E785 Hyperlipidemia, unspecified: Secondary | ICD-10-CM | POA: Diagnosis not present

## 2017-09-27 DIAGNOSIS — G4733 Obstructive sleep apnea (adult) (pediatric): Secondary | ICD-10-CM

## 2017-09-27 DIAGNOSIS — I10 Essential (primary) hypertension: Secondary | ICD-10-CM

## 2017-09-27 DIAGNOSIS — M160 Bilateral primary osteoarthritis of hip: Secondary | ICD-10-CM

## 2017-09-27 DIAGNOSIS — E039 Hypothyroidism, unspecified: Secondary | ICD-10-CM | POA: Diagnosis not present

## 2017-09-27 DIAGNOSIS — F329 Major depressive disorder, single episode, unspecified: Secondary | ICD-10-CM | POA: Diagnosis not present

## 2017-09-27 DIAGNOSIS — E1151 Type 2 diabetes mellitus with diabetic peripheral angiopathy without gangrene: Secondary | ICD-10-CM | POA: Diagnosis not present

## 2017-09-27 LAB — COMPREHENSIVE METABOLIC PANEL
ALK PHOS: 58 U/L (ref 39–117)
ALT: 8 U/L (ref 0–35)
AST: 6 U/L (ref 0–37)
Albumin: 4.1 g/dL (ref 3.5–5.2)
BUN: 7 mg/dL (ref 6–23)
CO2: 29 meq/L (ref 19–32)
Calcium: 9.5 mg/dL (ref 8.4–10.5)
Chloride: 101 mEq/L (ref 96–112)
Creatinine, Ser: 0.7 mg/dL (ref 0.40–1.20)
GFR: 88.76 mL/min (ref >60.00)
GLUCOSE: 116 mg/dL — AB (ref 70–99)
POTASSIUM: 4.2 meq/L (ref 3.5–5.1)
Sodium: 138 mEq/L (ref 135–145)
TOTAL PROTEIN: 7.2 g/dL (ref 6.0–8.3)
Total Bilirubin: 0.6 mg/dL (ref 0.2–1.2)

## 2017-09-27 LAB — HEMOGLOBIN A1C: HEMOGLOBIN A1C: 6.7 % — AB (ref 4.6–6.5)

## 2017-09-27 LAB — CBC WITH DIFFERENTIAL/PLATELET
BASOS PCT: 0.6 % (ref 0.0–3.0)
Basophils Absolute: 0 10*3/uL (ref 0.0–0.1)
EOS PCT: 1.4 % (ref 0.0–5.0)
Eosinophils Absolute: 0.1 10*3/uL (ref 0.0–0.7)
HCT: 43 % (ref 36.0–46.0)
Hemoglobin: 14.4 g/dL (ref 12.0–15.0)
LYMPHS ABS: 3.4 10*3/uL (ref 0.7–4.0)
Lymphocytes Relative: 39.3 % (ref 12.0–46.0)
MCHC: 33.5 g/dL (ref 30.0–36.0)
MCV: 84.6 fl (ref 78.0–100.0)
MONOS PCT: 8.2 % (ref 3.0–12.0)
Monocytes Absolute: 0.7 10*3/uL (ref 0.1–1.0)
Neutro Abs: 4.4 10*3/uL (ref 1.4–7.7)
Neutrophils Relative %: 50.5 % (ref 43.0–77.0)
Platelets: 255 10*3/uL (ref 150.0–400.0)
RBC: 5.08 Mil/uL (ref 3.87–5.11)
RDW: 15.9 % — AB (ref 11.5–15.5)
WBC: 8.7 10*3/uL (ref 4.0–10.5)

## 2017-09-27 LAB — LIPID PANEL
CHOL/HDL RATIO: 3
Cholesterol: 144 mg/dL (ref 0–200)
HDL: 54.6 mg/dL (ref >39.00)
LDL Cholesterol: 68 mg/dL (ref 0–99)
NONHDL: 89.12
Triglycerides: 107 mg/dL (ref 0.0–149.0)
VLDL: 21.4 mg/dL (ref 0.0–40.0)

## 2017-09-27 LAB — TSH: TSH: 2.03 u[IU]/mL (ref 0.35–4.50)

## 2017-09-27 MED ORDER — ACETAMINOPHEN-CODEINE #3 300-30 MG PO TABS: ORAL_TABLET | ORAL | 0 refills | Status: DC

## 2017-09-27 MED ORDER — SERTRALINE HCL 50 MG PO TABS: ORAL_TABLET | ORAL | 3 refills | Status: DC

## 2017-09-27 NOTE — Assessment & Plan Note (Addendum)
Using CPAP nightly, will continue

## 2017-09-27 NOTE — Assessment & Plan Note (Addendum)
Numbness/tingling in feet EMG done by Dr Posey Pronto Advised balance therapy, which she has not done - it is too expensive  Will get sugars better controlled

## 2017-09-27 NOTE — Assessment & Plan Note (Signed)
Fatigue and weight loss - likely both from depression Will check tsh and adjust medication if needed

## 2017-09-27 NOTE — Assessment & Plan Note (Signed)
BP well controlled Current regimen effective and well tolerated Continue current medications at current doses cmp  

## 2017-09-27 NOTE — Assessment & Plan Note (Addendum)
Taking tylenol with codeine at bedtime only It is helping her severe hip pain and helps her get better rest No side effects and taking medication appropriately Will continue, refilled today  [place] controlled substance database checked.  Ok to fill medication.

## 2017-09-27 NOTE — Assessment & Plan Note (Signed)
GERD controlled Continue daily medication  

## 2017-09-27 NOTE — Assessment & Plan Note (Signed)
Check a1c If not controlled will increase metformin Continue trulicity and jardiance

## 2017-09-27 NOTE — Patient Instructions (Addendum)
Call and schedule your mammogram -  [facility] 7 a.m.-6:30 p.m., Monday 7 a.m.-5 p.m., Tuesday-Friday Schedule an appointment by calling [phone]     Test(s) ordered today. Your results will be released to Alexis Grant (or called to you) after review, usually within 72hours after test completion. If any changes need to be made, you will be notified at that same time.  Flu immunization administered today.    Medications reviewed and updated.  Changes include decreasing cymbalta to 60 mg every other day for one week and then stop it.    Start sertraline when you stop the cymbalta.   Your prescription(s) have been submitted to your pharmacy. Please take as directed and contact our office if you believe you are having problem(s) with the medication(s).   Please followup in 2 months

## 2017-09-27 NOTE — Assessment & Plan Note (Signed)
Check lipid panel  Continue daily statin Regular exercise and healthy diet encouraged  

## 2017-09-27 NOTE — Assessment & Plan Note (Signed)
Not controlled - no improvement with increasing cymbalta - will taper off Start sertraline 50 mg once she has stopped cymbalta - increase to 100 mg after two weeks Follow up in 2 months, sooner if needed

## 2017-09-29 ENCOUNTER — Encounter: Payer: Self-pay | Admitting: Internal Medicine

## 2017-10-16 ENCOUNTER — Telehealth: Payer: Medicare Other | Admitting: Family

## 2017-10-16 DIAGNOSIS — B9689 Other specified bacterial agents as the cause of diseases classified elsewhere: Secondary | ICD-10-CM

## 2017-10-16 DIAGNOSIS — J028 Acute pharyngitis due to other specified organisms: Secondary | ICD-10-CM

## 2017-10-16 MED ORDER — BENZONATATE 100 MG PO CAPS: 100.0000 mg | ORAL_CAPSULE | Freq: Three times a day (TID) | ORAL | 0 refills | Status: DC | PRN

## 2017-10-16 MED ORDER — AZITHROMYCIN 250 MG PO TABS: ORAL_TABLET | ORAL | 0 refills | Status: DC

## 2017-10-16 MED ORDER — PREDNISONE 5 MG PO TABS: 5.0000 mg | ORAL_TABLET | ORAL | 0 refills | Status: DC

## 2017-10-16 NOTE — Progress Notes (Signed)
Thank you for the details you included in the comment boxes. Those details are very helpful in determining the best course of treatment for you and help us to provide the best care.  We are sorry that you are not feeling well.  Here is how we plan to help!  Based on your presentation I believe you most likely have A cough due to bacteria.  When patients have a fever and a productive cough with a change in color or increased sputum production, we are concerned about bacterial bronchitis.  If left untreated it can progress to pneumonia.  If your symptoms do not improve with your treatment plan it is important that you contact your provider.   I have prescribed Azithromyin 250 mg: two tablets now and then one tablet daily for 4 additonal days    In addition you may use A non-prescription cough medication called Mucinex DM: take 2 tablets every 12 hours. and A prescription cough medication called Tessalon Perles 100mg. You may take 1-2 capsules every 8 hours as needed for your cough.  Prednisone 5 mg daily for 6 days (see taper instructions below)  Directions for 6 day taper: Day 1: 2 tablets before breakfast, 1 after both lunch & dinner and 2 at bedtime Day 2: 1 tab before breakfast, 1 after both lunch & dinner and 2 at bedtime Day 3: 1 tab at each meal & 1 at bedtime Day 4: 1 tab at breakfast, 1 at lunch, 1 at bedtime Day 5: 1 tab at breakfast & 1 tab at bedtime Day 6: 1 tab at breakfast   From your responses in the eVisit questionnaire you describe inflammation in the upper respiratory tract which is causing a significant cough.  This is commonly called Bronchitis and has four common causes:    Allergies  Viral Infections  Acid Reflux  Bacterial Infection Allergies, viruses and acid reflux are treated by controlling symptoms or eliminating the cause. An example might be a cough caused by taking certain blood pressure medications. You stop the cough by changing the medication. Another  example might be a cough caused by acid reflux. Controlling the reflux helps control the cough.  USE OF BRONCHODILATOR ("RESCUE") INHALERS: There is a risk from using your bronchodilator too frequently.  The risk is that over-reliance on a medication which only relaxes the muscles surrounding the breathing tubes can reduce the effectiveness of medications prescribed to reduce swelling and congestion of the tubes themselves.  Although you feel brief relief from the bronchodilator inhaler, your asthma may actually be worsening with the tubes becoming more swollen and filled with mucus.  This can delay other crucial treatments, such as oral steroid medications. If you need to use a bronchodilator inhaler daily, several times per day, you should discuss this with your provider.  There are probably better treatments that could be used to keep your asthma under control.     HOME CARE . Only take medications as instructed by your medical team. . Complete the entire course of an antibiotic. . Drink plenty of fluids and get plenty of rest. . Avoid close contacts especially the very young and the elderly . Cover your mouth if you cough or cough into your sleeve. . Always remember to wash your hands . A steam or ultrasonic humidifier can help congestion.   GET HELP RIGHT AWAY IF: . You develop worsening fever. . You become short of breath . You cough up blood. . Your symptoms persist after you have   completed your treatment plan MAKE SURE YOU   Understand these instructions.  Will watch your condition.  Will get help right away if you are not doing well or get worse.  Your e-visit answers were reviewed by a board certified advanced clinical practitioner to complete your personal care plan.  Depending on the condition, your plan could have included both over the counter or prescription medications. If there is a problem please reply  once you have received a response from your provider. Your safety  is important to us.  If you have drug allergies check your prescription carefully.    You can use MyChart to ask questions about today's visit, request a non-urgent call back, or ask for a work or school excuse for 24 hours related to this e-Visit. If it has been greater than 24 hours you will need to follow up with your provider, or enter a new e-Visit to address those concerns. You will get an e-mail in the next two days asking about your experience.  I hope that your e-visit has been valuable and will speed your recovery. Thank you for using e-visits.   

## 2017-10-21 ENCOUNTER — Other Ambulatory Visit: Payer: Self-pay | Admitting: Neurology

## 2017-10-21 DIAGNOSIS — G43109 Migraine with aura, not intractable, without status migrainosus: Secondary | ICD-10-CM

## 2017-10-21 DIAGNOSIS — G40309 Generalized idiopathic epilepsy and epileptic syndromes, not intractable, without status epilepticus: Secondary | ICD-10-CM

## 2017-10-24 ENCOUNTER — Other Ambulatory Visit: Payer: Self-pay | Admitting: Internal Medicine

## 2017-10-24 DIAGNOSIS — Z1231 Encounter for screening mammogram for malignant neoplasm of breast: Secondary | ICD-10-CM

## 2017-10-25 ENCOUNTER — Other Ambulatory Visit: Payer: Self-pay | Admitting: Internal Medicine

## 2017-10-26 NOTE — Telephone Encounter (Signed)
[place] Controlled Substance Database checked. Last filled on 09/27/17  Last OV 09/27/17 

## 2017-11-11 ENCOUNTER — Other Ambulatory Visit: Payer: Self-pay | Admitting: Internal Medicine

## 2017-11-16 ENCOUNTER — Ambulatory Visit (INDEPENDENT_AMBULATORY_CARE_PROVIDER_SITE_OTHER): Payer: Medicare Other | Admitting: *Deleted

## 2017-11-16 VITALS — BP 117/72 | HR 69 | Resp 18 | Ht 63.0 in | Wt 272.0 lb

## 2017-11-16 DIAGNOSIS — E1151 Type 2 diabetes mellitus with diabetic peripheral angiopathy without gangrene: Secondary | ICD-10-CM

## 2017-11-16 DIAGNOSIS — Z0000 Encounter for general adult medical examination without abnormal findings: Secondary | ICD-10-CM

## 2017-11-16 NOTE — Progress Notes (Addendum)
Subjective:   Alexis Grant is a 67 y.o. female who presents for Medicare Annual (Subsequent) preventive examination.  Review of Systems:  No ROS.  Medicare Wellness Visit. Additional risk factors are reflected in the social history.  Cardiac Risk Factors include: advanced age (>57men, >44 women);diabetes mellitus;dyslipidemia;hypertension;obesity (BMI >30kg/m2) Sleep patterns: has difficulty falling asleep, gets up 2 times nightly to void and sleeps 5 hours nightly. Patient reports insomnia issues, discussed recommended sleep tips and stress reduction tips. Relevant patient education assigned to patient using Emmi.  Home Safety/Smoke Alarms: Feels safe in home. Smoke alarms in place.  Living environment; residence and Firearm Safety: 1-story house/ trailer, no firearms. Lives alone, no needs for DME, good support system Seat Belt Safety/Bike Helmet: Wears seat belt.     Objective:     Vitals: BP 117/72   Pulse 69   Resp 18   Ht 5\' 3"  (1.6 m)   Wt 272 lb (123.4 kg)   SpO2 98%   BMI 48.18 kg/m   Body mass index is 48.18 kg/m.  Advanced Directives 11/16/2017 08/11/2017 09/21/2016 09/08/2016 07/29/2016 07/16/2014  Does Patient Have a Medical Advance Directive? No No No No No No  Copy of Healthcare Power of Attorney in Chart? - - No - copy requested No - copy requested - -  Would patient like information on creating a medical advance directive? Yes (ED - Information included in AVS) No - Patient declined - - - No - patient declined information    Tobacco Social History   Tobacco Use  Smoking Status Never Smoker  Smokeless Tobacco Never Used     Counseling given: Not Answered  Past Medical History:  Diagnosis Date  . Adenomatous polyp of colon 2008  . Allergy   . Anemia   . Anxiety   . Arthritis   . Blood transfusion without reported diagnosis    following knee surgery   . Cataract    hx- removed both eyes  . Depression   . Diabetes mellitus    type II   .  Dyslipidemia   . Fibromyalgia   . Gallstones   . Gastritis   . GERD (gastroesophageal reflux disease)   . Hemangioma   . Hiatal hernia    Fibromyalgia  . Hyperlipidemia   . Hypertension   . Hypothyroidism 2010   Dr.[place]  . IBS (irritable bowel syndrome)    Dr Fuller Plan  . Migraines   . Myocardial infarct ([place]) 2010   subendocardrial, following R TKR  . Obesity   . OSA (obstructive sleep apnea)    cpap - does not know settings   . Pneumonia   . Seizure disorder ([place])   . Seizures ([place])    last seizure was 2002 per pt 07-29-16  . Sleep apnea   . Urinary tract infection    Past Surgical History:  Procedure Laterality Date  . ABDOMINAL HYSTERECTOMY  1988  . CARDIAC CATHETERIZATION  2007   DrGamble ( now seeing Dr Tonita Cong cardiology)  . CHOLECYSTECTOMY  1985  . COLONOSCOPY    . COLONOSCOPY WITH PROPOFOL N/A 07/16/2014   Procedure: COLONOSCOPY WITH PROPOFOL;  Surgeon: Ladene Artist, MD;  Location: [facility];  Service: Endoscopy;  Laterality: N/A;  . COLONOSCOPY WITH PROPOFOL N/A 09/21/2016   Procedure: COLONOSCOPY WITH PROPOFOL;  Surgeon: Ladene Artist, MD;  Location: [facility];  Service: Endoscopy;  Laterality: N/A;  . POLYPECTOMY  2012   6 or 7 adenomatous, Dr.Stark  . steroid injection to  left si joint  12/2008   Dr.Newton  . TOTAL KNEE ARTHROPLASTY Right 11/2005  . TOTAL KNEE ARTHROPLASTY Left   . TUBAL LIGATION  1986   Family History  Problem Relation Age of Onset  . Colon polyps Mother   . Ulcers Father   . Colon polyps Brother   . Diabetes Sister   . Diabetes Paternal Aunt   . Esophageal cancer Brother   . Kidney cancer Sister   . Colon cancer Neg Hx   . Heart attack Neg Hx   . Stroke Neg Hx   . Cancer Neg Hx   . Gallbladder disease Neg Hx   . Rectal cancer Neg Hx   . Stomach cancer Neg Hx    Social History   Socioeconomic History  . Marital status: Widowed    Spouse name: Not on file  . Number of children: 3  . Years of education:  college  . Highest education level: Not on file  Occupational History  . Occupation: Retired  Scientific laboratory technician  . Financial resource strain: Not hard at all  . Food insecurity:    Worry: Never true    Inability: Never true  . Transportation needs:    Medical: No    Non-medical: No  Tobacco Use  . Smoking status: Never Smoker  . Smokeless tobacco: Never Used  Substance and Sexual Activity  . Alcohol use: No    Alcohol/week: 0.0 standard drinks  . Drug use: No  . Sexual activity: Not Currently  Lifestyle  . Physical activity:    Days per week: 0 days    Minutes per session: 0 min  . Stress: To some extent  Relationships  . Social connections:    Talks on phone: More than three times a week    Gets together: More than three times a week    Attends religious service: More than 4 times per year    Active member of club or organization: Yes    Attends meetings of clubs or organizations: More than 4 times per year    Relationship status: Widowed  Other Topics Concern  . Not on file  Social History Narrative   Illicit drug use- no   Patient does not get regular exercise due to knee.   Patient lives at home alone.    Patient son and wife lives next door.   Patient has 3 children.    Patient has some college.    Patient retired May 2014.     Outpatient Encounter Medications as of 11/16/2017  Medication Sig  . acetaminophen-codeine (TYLENOL #3) 300-30 MG tablet TAKE 1 TO 2 TABLETS BY MOUTH AT BEDTIME FOR  CHRONIC  HIP  PAIN  . divalproex (DEPAKOTE) 500 MG DR tablet TAKE 1 TABLET BY MOUTH DAILY IN THE MORNING AND 2 TABS IN THE EVENING  . Dulaglutide (TRULICITY) 1.5 LF/8.1OF SOPN Inject 1.5 mg into the skin once a week.  . empagliflozin (JARDIANCE) 25 MG TABS tablet Take 25 mg by mouth daily.  [name] levothyroxine (SYNTHROID, LEVOTHROID) 75 MCG tablet Take 1 tablet (75 mcg total) by mouth daily before breakfast.  . losartan (COZAAR) 50 MG tablet TAKE 1 TABLET BY MOUTH ONCE DAILY  .  metFORMIN (GLUCOPHAGE) 1000 MG tablet TAKE 1 TABLET BY MOUTH ONCE DAILY WITH BREAKFAST  . omeprazole (PRILOSEC) 40 MG capsule TAKE 1 CAPSULE BY MOUTH ONCE DAILY  . ondansetron (ZOFRAN ODT) 4 MG disintegrating tablet Take 1 tablet (4 mg total) by mouth every 8 (eight)  hours as needed for nausea or vomiting.  . sertraline (ZOLOFT) 50 MG tablet Take 50 mg nightly for two weeks and then increase to 100 mg nightly  . simvastatin (ZOCOR) 20 MG tablet TAKE 1 TABLET BY MOUTH AT BEDTIME  . topiramate (TOPAMAX) 25 MG tablet Take 1 tablet in AM, 2 tablets in PM  . traZODone (DESYREL) 50 MG tablet TAKE 2 TABLETS BY MOUTH AT BEDTIME FOR SLEEP  . [DISCONTINUED] azithromycin (ZITHROMAX) 250 MG tablet Take 2 tabs now then 1 daily times 4 days (Patient not taking: Reported on 11/16/2017)  . [DISCONTINUED] benzonatate (TESSALON PERLES) 100 MG capsule Take 1-2 capsules (100-200 mg total) by mouth every 8 (eight) hours as needed for cough. (Patient not taking: Reported on 11/16/2017)  . [DISCONTINUED] Cholecalciferol (VITAMIN D) 2000 UNITS CAPS Take by mouth.    . [DISCONTINUED] divalproex (DEPAKOTE) 500 MG DR tablet Take 1 tablet twice a day (Patient not taking: Reported on 11/16/2017)  . [DISCONTINUED] predniSONE (DELTASONE) 5 MG tablet Take 1 tablet (5 mg total) by mouth as directed. Taper 6,5,4,3,2,1 (Patient not taking: Reported on 11/16/2017)   No facility-administered encounter medications on file as of 11/16/2017.     Activities of Daily Living In your present state of health, do you have any difficulty performing the following activities: 11/16/2017  Hearing? N  Vision? N  Difficulty concentrating or making decisions? N  Walking or climbing stairs? N  Dressing or bathing? N  Doing errands, shopping? N  Preparing Food and eating ? N  Using the Toilet? N  In the past six months, have you accidently leaked urine? N  Do you have problems with loss of bowel control? N  Managing your Medications? N    Managing your Finances? N  Housekeeping or managing your Housekeeping? N  Some recent data might be hidden    Patient Care Team: Binnie Rail, MD as PCP - General (Internal Medicine)    Assessment:   This is a routine wellness examination for [place]. Physical assessment deferred to PCP.   Exercise Activities and Dietary recommendations Current Exercise Habits: Home exercise routine, Time (Minutes): 30, Frequency (Times/Week): 2, Weekly Exercise (Minutes/Week): 60, Intensity: Mild, Exercise limited by: orthopedic condition(s)  Diet (meal preparation, eat out, water intake, caffeinated beverages, dairy products, fruits and vegetables): in general, a "healthy" diet  , well balanced   Reviewed heart healthy and diabetic diet. Encouraged patient to increase daily water and healthy fluid intake.  Goals    . Patient Stated     Increase my social activity by perhaps looking into the senior center.        Fall Risk Fall Risk  11/16/2017 07/05/2017 04/08/2017 11/15/2016 09/01/2016  Falls in the past year? Yes Yes Yes Yes Yes  Number falls in past yr: 2 or more 2 or more 2 or more 1 2 or more  Injury with Fall? Yes No Yes - No  Risk Factor Category  High Fall Risk High Fall Risk High Fall Risk - -  Risk for fall due to : Impaired balance/gait;Impaired mobility - - Impaired balance/gait Other (Comment)  Follow up Education provided;Falls prevention discussed Falls evaluation completed - - Falls evaluation completed;Education provided;Falls prevention discussed    Depression Screen PHQ 2/9 Scores 11/16/2017 11/15/2016  PHQ - 2 Score 3 0  PHQ- 9 Score 9 -     Cognitive Function       Ad8 score reviewed for issues:  Issues making decisions: no  Less interest in  hobbies / activities: no  Repeats questions, stories (family complaining): no  Trouble using ordinary gadgets (microwave, computer, phone):no  Forgets the month or year: no  Mismanaging finances: no  Remembering  appts: no  Daily problems with thinking and/or memory: no Ad8 score is= 0  Immunization History  Administered Date(s) Administered  . Influenza Split 10/26/2011  . Influenza, High Dose Seasonal PF 11/15/2016, 09/27/2017  . Influenza,inj,Quad PF,6+ Mos 10/25/2012, 11/25/2015  . Influenza-Unspecified 10/19/2014  . Pneumococcal Conjugate-13 05/24/2016  . Pneumococcal Polysaccharide-23 06/27/2017  . Td 10/04/2008   Screening Tests Health Maintenance  Topic Date Due  . MAMMOGRAM  09/04/2017  . FOOT EXAM  11/15/2017  . HEMOGLOBIN A1C  03/28/2018  . OPHTHALMOLOGY EXAM  03/29/2018  . TETANUS/TDAP  10/05/2018  . DEXA SCAN  11/18/2019  . COLONOSCOPY  09/22/2026  . INFLUENZA VACCINE  Completed  . Hepatitis C Screening  Completed  . PNA vac Low Risk Adult  Completed      Plan:   Continue doing brain stimulating activities (puzzles, reading, adult coloring books, staying active) to keep memory sharp.   Continue to eat heart healthy diet (full of fruits, vegetables, whole grains, lean protein, water--limit salt, fat, and sugar intake) and increase physical activity as tolerated.  I have personally reviewed and noted the following in the patient's chart:   . Medical and social history . Use of alcohol, tobacco or illicit drugs  . Current medications and supplements . Functional ability and status . Nutritional status . Physical activity . Advanced directives . List of other physicians . Vitals . Screenings to include cognitive, depression, and falls . Referrals and appointments  In addition, I have reviewed and discussed with patient certain preventive protocols, quality metrics, and best practice recommendations. A written personalized care plan for preventive services as well as general preventive health recommendations were provided to patient.     Michiel Cowboy, RN  11/16/2017    Medical screening examination/treatment/procedure(s) were performed by non-physician  practitioner and as supervising physician I was immediately available for consultation/collaboration. I agree with above. Binnie Rail, MD

## 2017-11-16 NOTE — Patient Instructions (Addendum)
Continue doing brain stimulating activities (puzzles, reading, adult coloring books, staying active) to keep memory sharp.   Continue to eat heart healthy diet (full of fruits, vegetables, whole grains, lean protein, water--limit salt, fat, and sugar intake) and increase physical activity as tolerated.   Alexis Grant , Thank you for taking time to come for your Medicare Wellness Visit. I appreciate your ongoing commitment to your health goals. Please review the following plan we discussed and let me know if I can assist you in the future.   These are the goals we discussed: Goals    . Patient Stated     Increase my social activity by perhaps looking into the senior center.        This is a list of the screening recommended for you and due dates:  Health Maintenance  Topic Date Due  . Mammogram  09/04/2017  . Complete foot exam   11/15/2017  . Hemoglobin A1C  03/28/2018  . Eye exam for diabetics  03/29/2018  . Tetanus Vaccine  10/05/2018  . DEXA scan (bone density measurement)  11/18/2019  . Colon Cancer Screening  09/22/2026  . Flu Shot  Completed  .  Hepatitis C: One time screening is recommended by Center for Disease Control  (CDC) for  adults born from [birth year] through 1965.   Completed  . Pneumonia vaccines  Completed     It is important to avoid accidents which may result in broken bones.  Here are a few ideas on how to make your home safer so you will be less likely to trip or fall.  1. Use nonskid mats or non slip strips in your shower or tub, on your bathroom floor and around sinks.  If you know that you have spilled water, wipe it up! 2. In the bathroom, it is important to have properly installed grab bars on the walls or on the edge of the tub.  Towel racks are NOT strong enough for you to hold onto or to pull on for support. 3. Stairs and hallways should have enough light.  Add lamps or night lights if you need ore light. 4. It is good to have handrails on both sides of the  stairs if possible.  Always fix broken handrails right away. 5. It is important to see the edges of steps.  Paint the edges of outdoor steps white so you can see them better.  Put colored tape on the edge of inside steps. 6. Throw-rugs are dangerous because they can slide.  Removing the rugs is the best idea, but if they must stay, add adhesive carpet tape to prevent slipping. 7. Do not keep things on stairs or in the halls.  Remove small furniture that blocks the halls as it may cause you to trip.  Keep telephone and electrical cords out of the way where you walk. 8. Always were sturdy, rubber-soled shoes for good support.  Never wear just socks, especially on the stairs.  Socks may cause you to slip or fall.  Do not wear full-length housecoats as you can easily trip on the bottom.  9. Place the things you use the most on the shelves that are the easiest to reach.  If you use a stepstool, make sure it is in good condition.  If you feel unsteady, DO NOT climb, ask for help. 10. If a health professional advises you to use a cane or walker, do not be ashamed.  These items can keep you from falling and  breaking your bones.  Health Maintenance, Female Adopting a healthy lifestyle and getting preventive care can go a long way to promote health and wellness. Talk with your health care provider about what schedule of regular examinations is right for you. This is a good chance for you to check in with your provider about disease prevention and staying healthy. In between checkups, there are plenty of things you can do on your own. Experts have done a lot of research about which lifestyle changes and preventive measures are most likely to keep you healthy. Ask your health care provider for more information. Weight and diet Eat a healthy diet  Be sure to include plenty of vegetables, fruits, low-fat dairy products, and lean protein.  Do not eat a lot of foods high in solid fats, added sugars, or salt.  Get  regular exercise. This is one of the most important things you can do for your health. ? Most adults should exercise for at least 150 minutes each week. The exercise should increase your heart rate and make you sweat (moderate-intensity exercise). ? Most adults should also do strengthening exercises at least twice a week. This is in addition to the moderate-intensity exercise.  Maintain a healthy weight  Body mass index (BMI) is a measurement that can be used to identify possible weight problems. It estimates body fat based on height and weight. Your health care provider can help determine your BMI and help you achieve or maintain a healthy weight.  For females 67 years of age and older: ? A BMI below 18.5 is considered underweight. ? A BMI of 18.5 to 24.9 is normal. ? A BMI of 25 to 29.9 is considered overweight. ? A BMI of 30 and above is considered obese.  Watch levels of cholesterol and blood lipids  You should start having your blood tested for lipids and cholesterol at 67 years of age, then have this test every 5 years. for lipids and cholesterol at 67 years of age, then have this test every 5 years.  You may need to have your cholesterol levels checked more often if: ? Your lipid or cholesterol levels are high. ? You are older than 67 years of age.. ? You are at high risk for heart disease.  Cancer screening Lung Cancer  Lung cancer screening is recommended for adults 67-50 years old who are at high risk for lung cancer because of a history of smoking.  A yearly low-dose CT scan of the lungs is recommended for people who: ? Currently smoke. ? Have quit within the past 15 years. ? Have at least a 30-pack-year history of smoking. A pack year is smoking an average of one pack of cigarettes a day for 1 year.  Yearly screening should continue until it has been 15 years since you quit.  Yearly screening should stop if you develop a health problem that would prevent you from having lung cancer treatment.  Breast Cancer  Practice breast self-awareness. This  means understanding how your breasts normally appear and feel.  It also means doing regular breast self-exams. Let your health care provider know about any changes, no matter how small.  If you are in your 20s or 30s, you should have a clinical breast exam (CBE) by a health care provider every 1-3 years as part of a regular health exam.  If you are 52 or older, have a CBE every year. Also consider having a breast X-ray (mammogram) every year.  If you have a family history of breast cancer, talk to your health care provider about genetic screening.  If  you are at high risk for breast cancer, talk to your health care provider about having an MRI and a mammogram every year.  Breast cancer gene (BRCA) assessment is recommended for women who have family members with BRCA-related cancers. BRCA-related cancers include: ? Breast. ? Ovarian. ? Tubal. ? Peritoneal cancers.  Results of the assessment will determine the need for genetic counseling and BRCA1 and BRCA2 testing.  Cervical Cancer Your health care provider may recommend that you be screened regularly for cancer of the pelvic organs (ovaries, uterus, and vagina). This screening involves a pelvic examination, including checking for microscopic changes to the surface of your cervix (Pap test). You may be encouraged to have this screening done every 3 years, beginning at age 57.  For women ages 58-65, health care providers may recommend pelvic exams and Pap testing every 3 years, or they may recommend the Pap and pelvic exam, combined with testing for human papilloma virus (HPV), every 5 years. Some types of HPV increase your risk of cervical cancer. Testing for HPV may also be done on women of any age with unclear Pap test results.  Other health care providers may not recommend any screening for nonpregnant women who are considered low risk for pelvic cancer and who do not have symptoms. Ask your health care provider if a screening pelvic exam  is right for you.  If you have had past treatment for cervical cancer or a condition that could lead to cancer, you need Pap tests and screening for cancer for at least 20 years after your treatment. If Pap tests have been discontinued, your risk factors (such as having a new sexual partner) need to be reassessed to determine if screening should resume. Some women have medical problems that increase the chance of getting cervical cancer. In these cases, your health care provider may recommend more frequent screening and Pap tests.  Colorectal Cancer  This type of cancer can be detected and often prevented.  Routine colorectal cancer screening usually begins at 67 years of age and continues through 67 years of age.  Your health care provider may recommend screening at an earlier age if you have risk factors for colon cancer.  Your health care provider may also recommend using home test kits to check for hidden blood in the stool.  A small camera at the end of a tube can be used to examine your colon directly (sigmoidoscopy or colonoscopy). This is done to check for the earliest forms of colorectal cancer.  Routine screening usually begins at age 43.  Direct examination of the colon should be repeated every 5-10 years through 67 years of age. However, you may need to be screened more often if early forms of precancerous polyps or small growths are found.  Skin Cancer  Check your skin from head to toe regularly.  Tell your health care provider about any new moles or changes in moles, especially if there is a change in a mole's shape or color.  Also tell your health care provider if you have a mole that is larger than the size of a pencil eraser.  Always use sunscreen. Apply sunscreen liberally and repeatedly throughout the day.  Protect yourself by wearing long sleeves, pants, a wide-brimmed hat, and sunglasses whenever you are outside.  Heart disease, diabetes, and high blood  pressure  High blood pressure causes heart disease and increases the risk of stroke. High blood pressure is more likely to develop in: ? People who have blood pressure  in the high end of the normal range (130-139/85-89 mm Hg). ? People who are overweight or obese. ? People who are African American.  If you are 34-83 years of age, have your blood pressure checked every 3-5 years. If you are 89 years of age or older, have your blood pressure checked every year. You should have your blood pressure measured twice-once when you are at a hospital or clinic, and once when you are not at a hospital or clinic. Record the average of the two measurements. To check your blood pressure when you are not at a hospital or clinic, you can use: ? An automated blood pressure machine at a pharmacy. ? A home blood pressure monitor.  If you are between 26 years and 70 years old, ask your health care provider if you should take aspirin to prevent strokes.  Have regular diabetes screenings. This involves taking a blood sample to check your fasting blood sugar level. ? If you are at a normal weight and have a low risk for diabetes, have this test once every three years after 67 years of age. ? If you are overweight and have a high risk for diabetes, consider being tested at a younger age or more often. Preventing infection Hepatitis B  If you have a higher risk for hepatitis B, you should be screened for this virus. You are considered at high risk for hepatitis B if: ? You were born in a country where hepatitis B is common. Ask your health care provider which countries are considered high risk. ? Your parents were born in a high-risk country, and you have not been immunized against hepatitis B (hepatitis B vaccine). ? You have HIV or AIDS. ? You use needles to inject street drugs. ? You live with someone who has hepatitis B. ? You have had sex with someone who has hepatitis B. ? You get hemodialysis  treatment. ? You take certain medicines for conditions, including cancer, organ transplantation, and autoimmune conditions.  Hepatitis C  Blood testing is recommended for: ? Everyone born from [birth year] through 1965. ? Anyone with known risk factors for hepatitis C.  Sexually transmitted infections (STIs)  You should be screened for sexually transmitted infections (STIs) including gonorrhea and chlamydia if: ? You are sexually active and are younger than 67 years of age. ? You are older than 67 years of age and your health care provider tells you that you are at risk for this type of infection. ? Your sexual activity has changed since you were last screened and you are at an increased risk for chlamydia or gonorrhea. Ask your health care provider if you are at risk.  If you do not have HIV, but are at risk, it may be recommended that you take a prescription medicine daily to prevent HIV infection. This is called pre-exposure prophylaxis (PrEP). You are considered at risk if: ? You are sexually active and do not regularly use condoms or know the HIV status of your partner(s). ? You take drugs by injection. ? You are sexually active with a partner who has HIV.  Talk with your health care provider about whether you are at high risk of being infected with HIV. If you choose to begin PrEP, you should first be tested for HIV. You should then be tested every 3 months for as long as you are taking PrEP. Pregnancy  If you are premenopausal and you may become pregnant, ask your health care provider about preconception counseling.  If you may become pregnant, take 400 to 800 micrograms (mcg) of folic acid every day.  If you want to prevent pregnancy, talk to your health care provider about birth control (contraception). Osteoporosis and menopause  Osteoporosis is a disease in which the bones lose minerals and strength with aging. This can result in serious bone fractures. Your risk for osteoporosis  can be identified using a bone density scan.  If you are 47 years of age or older, or if you are at risk for osteoporosis and fractures, ask your health care provider if you should be screened.  Ask your health care provider whether you should take a calcium or vitamin D supplement to lower your risk for osteoporosis.  Menopause may have certain physical symptoms and risks.  Hormone replacement therapy may reduce some of these symptoms and risks. Talk to your health care provider about whether hormone replacement therapy is right for you. Follow these instructions at home:  Schedule regular health, dental, and eye exams.  Stay current with your immunizations.  Do not use any tobacco products including cigarettes, chewing tobacco, or electronic cigarettes.  If you are pregnant, do not drink alcohol.  If you are breastfeeding, limit how much and how often you drink alcohol.  Limit alcohol intake to no more than 1 drink per day for nonpregnant women. One drink equals 12 ounces of beer, 5 ounces of wine, or 1 ounces of hard liquor.  Do not use street drugs.  Do not share needles.  Ask your health care provider for help if you need support or information about quitting drugs.  Tell your health care provider if you often feel depressed.  Tell your health care provider if you have ever been abused or do not feel safe at home. This information is not intended to replace advice given to you by your health care provider. Make sure you discuss any questions you have with your health care provider. Document Released: 07/20/2010 Document Revised: 06/12/2015 Document Reviewed: 10/08/2014 Elsevier Interactive Patient Education  [employer].

## 2017-11-17 ENCOUNTER — Encounter: Payer: Self-pay | Admitting: Family

## 2017-11-27 ENCOUNTER — Other Ambulatory Visit: Payer: Self-pay | Admitting: Internal Medicine

## 2017-11-28 NOTE — Telephone Encounter (Signed)
Done erx 

## 2017-11-28 NOTE — Telephone Encounter (Signed)
Check [place] [facility] last filled 10/26/2017. MD is out of the office today pls advise on refill.Marland KitchenJohny Chess

## 2017-11-29 ENCOUNTER — Ambulatory Visit
Admission: RE | Admit: 2017-11-29 | Discharge: 2017-11-29 | Disposition: A | Discharge status: Home / Self Care | Payer: Medicare Other | Source: Ambulatory Visit | Attending: Internal Medicine | Admitting: Internal Medicine

## 2017-11-29 DIAGNOSIS — Z1231 Encounter for screening mammogram for malignant neoplasm of breast: Secondary | ICD-10-CM

## 2017-12-01 ENCOUNTER — Other Ambulatory Visit: Payer: Self-pay | Admitting: Internal Medicine

## 2017-12-01 DIAGNOSIS — R928 Other abnormal and inconclusive findings on diagnostic imaging of breast: Secondary | ICD-10-CM

## 2017-12-01 NOTE — Progress Notes (Deleted)
Subjective:    Patient ID: Alexis Grant, female    DOB: [birth year], 67 y.o.   MRN: [id number]  HPI The patient is here for follow up.  She was here 2 months ago and her depression was not controlled-we discontinued her Cymbalta and put her on sertraline.  Depression: She is taking her medication daily as prescribed. She denies any side effects from the medication. She feels her depression is well controlled and she is happy with her current dose of medication.    Hypertension: She is taking her medication daily. She is compliant with a low sodium diet.  She denies chest pain, palpitations, edema, shortness of breath and regular headaches. She is exercising regularly.  She does not monitor her blood pressure at home.    Diabetes: She is taking her medication daily as prescribed. She is compliant with a diabetic diet. She is exercising regularly. She monitors her sugars and they have been running XXX. She checks her feet daily and denies foot lesions. She is up-to-date with an ophthalmology examination.   B/l hip pain from OA, severe:  She is taking tylenol #3 at bedtime only and that does help.    Hypothyroidism:  She is taking her medication daily.  She denies any recent changes in energy or weight that are unexplained.    Medications and allergies reviewed with patient and updated if appropriate.  Patient Active Problem List   Diagnosis Date Noted  . Polyneuropathy 09/27/2017  . Osteoarthritis, hip, bilateral 05/25/2017  . Osteopenia 11/22/2016  . GERD (gastroesophageal reflux disease) 11/15/2016  . Difficulty sleeping 05/24/2016  . Rosacea 11/25/2015  . Poor balance 11/25/2015  . Tremor 04/18/2015  . Other fatigue 02/17/2015  . Migraine with aura and without status migrainosus, not intractable 12/04/2014  . Localization-related (focal) (partial) idiopathic epilepsy and epileptic syndromes with seizures of localized onset, not intractable, without status epilepticus ([place])  12/04/2014  . Hx of adenomatous colonic polyps 07/16/2014  . Benign neoplasm of ascending colon 07/16/2014  . Benign neoplasm of descending colon 07/16/2014  . Benign neoplasm of cecum 07/16/2014  . Benign neoplasm of transverse colon 07/16/2014  . Fibromyalgia 05/30/2013  . Generalized convulsive epilepsy ([place]) 10/20/2012  . Seasonal allergies 12/08/2011  . DM (diabetes mellitus), type 2 with peripheral vascular complications ([place]) 71/06/2692  . Essential hypertension 01/05/2010  . Irritable bowel syndrome 01/05/2010  . VERTIGO 09/16/2009  . VITAMIN D DEFICIENCY 02/24/2009  . MYOCARDIAL INFARCTION, ACUTE, SUBENDOCARDIAL 10/04/2008  . Hypothyroidism 03/19/2008  . COLONIC POLYPS, ADENOMATOUS, HX OF 08/07/2007  . HEMANGIOMA 04/26/2007  . Dyslipidemia 04/26/2007  . OBESITY 04/26/2007  . Depression 04/26/2007  . SLEEP APNEA, OBSTRUCTIVE 04/26/2007  . HIATAL HERNIA 04/26/2007  . FATTY LIVER DISEASE 04/26/2007  . ARTHRITIS 04/26/2007    Current Outpatient Medications on File Prior to Visit  Medication Sig Dispense Refill  . acetaminophen-codeine (TYLENOL #3) 300-30 MG tablet TAKE 1 TO 2 TABLETS BY MOUTH AT BEDTIME FOR CHRONIC HIP PAIN 60 tablet 0  . divalproex (DEPAKOTE) 500 MG DR tablet TAKE 1 TABLET BY MOUTH DAILY IN THE MORNING AND 2 TABS IN THE EVENING 90 tablet 11  . Dulaglutide (TRULICITY) 1.5 WN/4.6EV SOPN Inject 1.5 mg into the skin once a week. 4 pen 5  . empagliflozin (JARDIANCE) 25 MG TABS tablet Take 25 mg by mouth daily. 90 tablet 1  . levothyroxine (SYNTHROID, LEVOTHROID) 75 MCG tablet Take 1 tablet (75 mcg total) by mouth daily before breakfast. 90 tablet 1  .  losartan (COZAAR) 50 MG tablet TAKE 1 TABLET BY MOUTH ONCE DAILY 90 tablet 1  . metFORMIN (GLUCOPHAGE) 1000 MG tablet TAKE 1 TABLET BY MOUTH ONCE DAILY WITH BREAKFAST 90 tablet 1  . omeprazole (PRILOSEC) 40 MG capsule TAKE 1 CAPSULE BY MOUTH ONCE DAILY 90 capsule 1  . ondansetron (ZOFRAN ODT) 4 MG disintegrating  tablet Take 1 tablet (4 mg total) by mouth every 8 (eight) hours as needed for nausea or vomiting. 20 tablet 0  . sertraline (ZOLOFT) 50 MG tablet Take 50 mg nightly for two weeks and then increase to 100 mg nightly 60 tablet 3  . simvastatin (ZOCOR) 20 MG tablet TAKE 1 TABLET BY MOUTH AT BEDTIME 90 tablet 1  . topiramate (TOPAMAX) 25 MG tablet Take 1 tablet in AM, 2 tablets in PM 90 tablet 11  . traZODone (DESYREL) 50 MG tablet TAKE 2 TABLETS BY MOUTH AT BEDTIME FOR SLEEP 180 tablet 1   No current facility-administered medications on file prior to visit.     Past Medical History:  Diagnosis Date  . Adenomatous polyp of colon 2008  . Allergy   . Anemia   . Anxiety   . Arthritis   . Blood transfusion without reported diagnosis    following knee surgery   . Cataract    hx- removed both eyes  . Depression   . Diabetes mellitus    type II   . Dyslipidemia   . Fibromyalgia   . Gallstones   . Gastritis   . GERD (gastroesophageal reflux disease)   . Hemangioma   . Hiatal hernia    Fibromyalgia  . Hyperlipidemia   . Hypertension   . Hypothyroidism 2010   Dr.Deveshawr  . IBS (irritable bowel syndrome)    Dr Fuller Plan  . Migraines   . Myocardial infarct ([place]) 2010   subendocardrial, following R TKR  . Obesity   . OSA (obstructive sleep apnea)    cpap - does not know settings   . Pneumonia   . Seizure disorder ([place])   . Seizures ([place])    last seizure was 2002 per pt 07-29-16  . Sleep apnea   . Urinary tract infection     Past Surgical History:  Procedure Laterality Date  . ABDOMINAL HYSTERECTOMY  1988  . CARDIAC CATHETERIZATION  2007   DrGamble ( now seeing Dr Tonita Cong cardiology)  . CHOLECYSTECTOMY  1985  . COLONOSCOPY    . COLONOSCOPY WITH PROPOFOL N/A 07/16/2014   Procedure: COLONOSCOPY WITH PROPOFOL;  Surgeon: Ladene Artist, MD;  Location: [facility];  Service: Endoscopy;  Laterality: N/A;  . COLONOSCOPY WITH PROPOFOL N/A 09/21/2016   Procedure: COLONOSCOPY WITH  PROPOFOL;  Surgeon: Ladene Artist, MD;  Location: [facility];  Service: Endoscopy;  Laterality: N/A;  . POLYPECTOMY  2012   6 or 7 adenomatous, Dr.Stark  . steroid injection to left si joint  12/2008   Dr.Newton  . TOTAL KNEE ARTHROPLASTY Right 11/2005  . TOTAL KNEE ARTHROPLASTY Left   . TUBAL LIGATION  1986    Social History   Socioeconomic History  . Marital status: Widowed    Spouse name: Not on file  . Number of children: 3  . Years of education: college  . Highest education level: Not on file  Occupational History  . Occupation: Retired  Scientific laboratory technician  . Financial resource strain: Not hard at all  . Food insecurity:    Worry: Never true    Inability: Never true  . Transportation  needs:    Medical: No    Non-medical: No  Tobacco Use  . Smoking status: Never Smoker  . Smokeless tobacco: Never Used  Substance and Sexual Activity  . Alcohol use: No    Alcohol/week: 0.0 standard drinks  . Drug use: No  . Sexual activity: Not Currently  Lifestyle  . Physical activity:    Days per week: 0 days    Minutes per session: 0 min  . Stress: To some extent  Relationships  . Social connections:    Talks on phone: More than three times a week    Gets together: More than three times a week    Attends religious service: More than 4 times per year    Active member of club or organization: Yes    Attends meetings of clubs or organizations: More than 4 times per year    Relationship status: Widowed  Other Topics Concern  . Not on file  Social History Narrative   Illicit drug use- no   Patient does not get regular exercise due to knee.   Patient lives at home alone.    Patient son and wife lives next door.   Patient has 3 children.    Patient has some college.    Patient retired May 2014.     Family History  Problem Relation Age of Onset  . Colon polyps Mother   . Ulcers Father   . Colon polyps Brother   . Diabetes Sister   . Diabetes Paternal Aunt   . Esophageal  cancer Brother   . Kidney cancer Sister   . Colon cancer Neg Hx   . Heart attack Neg Hx   . Stroke Neg Hx   . Cancer Neg Hx   . Gallbladder disease Neg Hx   . Rectal cancer Neg Hx   . Stomach cancer Neg Hx     Review of Systems     Objective:  There were no vitals filed for this visit. BP Readings from Last 3 Encounters:  11/16/17 117/72  09/27/17 118/72  08/30/17 106/68   Wt Readings from Last 3 Encounters:  11/16/17 272 lb (123.4 kg)  09/27/17 274 lb (124.3 kg)  08/30/17 274 lb 0.6 oz (124.3 kg)   There is no height or weight on file to calculate BMI.   Physical Exam    Constitutional: Appears well-developed and well-nourished. No distress.  HENT:  Head: Normocephalic and atraumatic.  Neck: Neck supple. No tracheal deviation present. No thyromegaly present.  No cervical lymphadenopathy Cardiovascular: Normal rate, regular rhythm and normal heart sounds.   No murmur heard. No carotid bruit .  No edema Pulmonary/Chest: Effort normal and breath sounds normal. No respiratory distress. No has no wheezes. No rales.  Skin: Skin is warm and dry. Not diaphoretic.  Psychiatric: Normal mood and affect. Behavior is normal.      Assessment & Plan:    See Problem List for Assessment and Plan of chronic medical problems.

## 2017-12-02 ENCOUNTER — Ambulatory Visit: Payer: Medicare Other | Admitting: Internal Medicine

## 2017-12-02 DIAGNOSIS — Z0289 Encounter for other administrative examinations: Secondary | ICD-10-CM

## 2017-12-05 ENCOUNTER — Other Ambulatory Visit: Payer: Self-pay | Admitting: Internal Medicine

## 2017-12-05 NOTE — Telephone Encounter (Signed)
MD approved and sent electronically to [facility]../[facility]  

## 2017-12-06 ENCOUNTER — Ambulatory Visit: Payer: Medicare Other

## 2017-12-06 ENCOUNTER — Ambulatory Visit
Admission: RE | Admit: 2017-12-06 | Discharge: 2017-12-06 | Disposition: A | Discharge status: Home / Self Care | Payer: Medicare Other | Source: Ambulatory Visit | Attending: Internal Medicine | Admitting: Internal Medicine

## 2017-12-06 DIAGNOSIS — R928 Other abnormal and inconclusive findings on diagnostic imaging of breast: Secondary | ICD-10-CM

## 2017-12-07 ENCOUNTER — Encounter: Payer: Self-pay | Admitting: Internal Medicine

## 2017-12-20 ENCOUNTER — Other Ambulatory Visit: Payer: Self-pay | Admitting: Internal Medicine

## 2017-12-27 ENCOUNTER — Other Ambulatory Visit: Payer: Self-pay | Admitting: Internal Medicine

## 2017-12-28 NOTE — Telephone Encounter (Signed)
Last refill was 11/28/17 Last OV was 09/27/17  Next OV is 12/30/17

## 2017-12-29 NOTE — Progress Notes (Signed)
Subjective:    Patient ID: Alexis Grant, female    DOB: 1950/06/12, 67 y.o.   MRN: [id number]  HPI The patient is here for follow up.  She was here 2 months ago and her depression was not controlled-we discontinued her Cymbalta and put her on sertraline.  Depression: She is taking her medication daily as prescribed. She denies any side effects from the medication. She feels her depression is much better and currently well controlled.  She is happy with the new medication.  She does not feel that the dose needs to be adjusted at this time.  Hypertension: She is taking her medication daily. She is compliant with a low sodium diet.  She denies chest pain, palpitations, edema, shortness of breath.  She is not exercising regularly.      Diabetes: She is taking her medication daily as prescribed. She is compliant with a diabetic diet.   B/l hip pain from OA, severe:  She is taking tylenol #3 at bedtime only and that does help.  The pain has gotten worse - it is difficulty to get up from a chair.  She does some leg exercises -  It helps sometimes.  She has 2 dogs so she is somewhat active.  She does not take anything during the day for the pain and wonders what would help.   2 months ago she noticed a lump on her left neck.  It is not painful and has not changed in size.  It is on the outside of her skin and she wondered if it was a ingrown hair.  Left upper eyelid infection about one month ago - cleared up.  Since then GI has been watery.  Her vision has been blurry.    Medications and allergies reviewed with patient and updated if appropriate.  Patient Active Problem List   Diagnosis Date Noted  . Polyneuropathy 09/27/2017  . Osteoarthritis, hip, bilateral 05/25/2017  . Osteopenia 11/22/2016  . GERD (gastroesophageal reflux disease) 11/15/2016  . Difficulty sleeping 05/24/2016  . Rosacea 11/25/2015  . Poor balance 11/25/2015  . Tremor 04/18/2015  . Other fatigue 02/17/2015  .  Migraine with aura and without status migrainosus, not intractable 12/04/2014  . 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 Hx of adenomatous colonic polyps 07/16/2014  . Benign neoplasm of ascending colon 07/16/2014  . Benign neoplasm of descending colon 07/16/2014  . Benign neoplasm of cecum 07/16/2014  . Benign neoplasm of transverse colon 07/16/2014  . Fibromyalgia 05/30/2013  . Generalized convulsive epilepsy ([place]) 10/20/2012  . Seasonal allergies 12/08/2011  . DM (diabetes mellitus), type 2 with peripheral vascular complications ([place]) 92/44/6286  . Essential hypertension 01/05/2010  . Irritable bowel syndrome 01/05/2010  . VERTIGO 09/16/2009  . VITAMIN D DEFICIENCY 02/24/2009  . MYOCARDIAL INFARCTION, ACUTE, SUBENDOCARDIAL 10/04/2008  . Hypothyroidism 03/19/2008  . COLONIC POLYPS, ADENOMATOUS, HX OF 08/07/2007  . HEMANGIOMA 04/26/2007  . Dyslipidemia 04/26/2007  . OBESITY 04/26/2007  . Depression 04/26/2007  . SLEEP APNEA, OBSTRUCTIVE 04/26/2007  . HIATAL HERNIA 04/26/2007  . FATTY LIVER DISEASE 04/26/2007  . ARTHRITIS 04/26/2007    Current Outpatient Medications on File Prior to Visit  Medication Sig Dispense Refill  . acetaminophen-codeine (TYLENOL #3) 300-30 MG tablet TAKE 1 TO 2 TABLETS BY MOUTH AT BEDTIME FOR  CHRONIC  HIP  PAIN 60 tablet 0  . divalproex (DEPAKOTE) 500 MG DR tablet TAKE 1  TABLET BY MOUTH DAILY IN THE MORNING AND 2 TABS IN THE EVENING 90 tablet 11  . Dulaglutide (TRULICITY) 1.5 WF/0.9NA SOPN Inject 1.5 mg into the skin once a week. 4 pen 5  . empagliflozin (JARDIANCE) 25 MG TABS tablet Take 25 mg by mouth daily. 90 tablet 1  . levothyroxine (SYNTHROID, LEVOTHROID) 75 MCG tablet TAKE 1 TABLET BY MOUTH ONCE DAILY BEFORE BREAKFAST 90 tablet 1  . losartan (COZAAR) 50 MG tablet TAKE 1 TABLET BY MOUTH ONCE DAILY 90 tablet 1    . metFORMIN (GLUCOPHAGE) 1000 MG tablet TAKE 1 TABLET BY MOUTH ONCE DAILY WITH BREAKFAST 90 tablet 1  . omeprazole (PRILOSEC) 40 MG capsule TAKE 1 CAPSULE BY MOUTH ONCE DAILY 90 capsule 1  . ondansetron (ZOFRAN ODT) 4 MG disintegrating tablet Take 1 tablet (4 mg total) by mouth every 8 (eight) hours as needed for nausea or vomiting. 20 tablet 0  . sertraline (ZOLOFT) 50 MG tablet Take 50 mg nightly for two weeks and then increase to 100 mg nightly 60 tablet 3  . simvastatin (ZOCOR) 20 MG tablet TAKE 1 TABLET BY MOUTH AT BEDTIME 90 tablet 1  . topiramate (TOPAMAX) 25 MG tablet Take 1 tablet in AM, 2 tablets in PM 90 tablet 11  . traZODone (DESYREL) 50 MG tablet TAKE 2 TABLETS BY MOUTH AT BEDTIME FOR SLEEP 180 tablet 1   No current facility-administered medications on file prior to visit.     Past Medical History:  Diagnosis Date  . Adenomatous polyp of colon 2008  . Allergy   . Anemia   . Anxiety   . Arthritis   . Blood transfusion without reported diagnosis    following knee surgery   . Cataract    hx- removed both eyes  . Depression   . Diabetes mellitus    type II   . Dyslipidemia   . Fibromyalgia   . Gallstones   . Gastritis   . GERD (gastroesophageal reflux disease)   . Hemangioma   . Hiatal hernia    Fibromyalgia  . Hyperlipidemia   . Hypertension   . Hypothyroidism 2010   Dr.Deveshawr  . IBS (irritable bowel syndrome)    Dr Fuller Plan  . Migraines   . Myocardial infarct ([place]) 2010   subendocardrial, following R TKR  . Obesity   . OSA (obstructive sleep apnea)    cpap - does not know settings   . Pneumonia   . Seizure disorder ([place])   . Seizures ([place])    last seizure was 2002 per pt 07-29-16  . Sleep apnea   . Urinary tract infection     Past Surgical History:  Procedure Laterality Date  . ABDOMINAL HYSTERECTOMY  1988  . CARDIAC CATHETERIZATION  2007   DrGamble ( now seeing Dr Tonita Cong cardiology)  . CHOLECYSTECTOMY  1985  . COLONOSCOPY    . COLONOSCOPY  WITH PROPOFOL N/A 07/16/2014   Procedure: COLONOSCOPY WITH PROPOFOL;  Surgeon: Ladene Artist, MD;  [facility];  Service: Endoscopy;  Laterality: N/A;  . COLONOSCOPY WITH PROPOFOL N/A 09/21/2016   Procedure: COLONOSCOPY WITH PROPOFOL;  Surgeon: Ladene Artist, MD;  Location[facility];  Service: Endoscopy;  Laterality: N/A;  . POLYPECTOMY  2012   6 or 7 adenomatous, Dr.Stark  . steroid injection to left si joint  12/2008   Dr.Newton  . TOTAL KNEE ARTHROPLASTY Right 11/2005  . TOTAL KNEE ARTHROPLASTY Left   . [place]    Social History  Socioeconomic History  . Marital status: Widowed    Spouse name: Not on file  . Number of children: 3  . Years of education: college  . Highest education level: Not on file  Occupational History  . Occupation: Retired  Scientific laboratory technician  . Financial resource strain: Not hard at all  . Food insecurity:    Worry: Never true    Inability: Never true  . Transportation needs:    Medical: No    Non-medical: No  Tobacco Use  . Smoking status: Never Smoker  . Smokeless tobacco: Never Used  Substance and Sexual Activity  . Alcohol use: No    Alcohol/week: 0.0 standard drinks  . Drug use: No  . Sexual activity: Not Currently  Lifestyle  . Physical activity:    Days per week: 0 days    Minutes per session: 0 min  . Stress: To some extent  Relationships  . Social connections:    Talks on phone: More than three times a week    Gets together: More than three times a week    Attends religious service: More than 4 times per year    Active member of club or organization: Yes    Attends meetings of clubs or organizations: More than 4 times per year    Relationship status: Widowed  Other Topics Concern  . Not on file  Social History Narrative   Illicit drug use- no   Patient does not get regular exercise due to knee.   Patient lives at home alone.    Patient son and wife lives next door.   Patient has 3 children.     Patient has some college.    Patient retired May 2014.     Family History  Problem Relation Age of Onset  . Colon polyps Mother   . Ulcers Father   . Colon polyps Brother   . Diabetes Sister   . Diabetes Paternal Aunt   . Esophageal cancer Brother   . Kidney cancer Sister   . Colon cancer Neg Hx   . Heart attack Neg Hx   . Stroke Neg Hx   . Cancer Neg Hx   . Gallbladder disease Neg Hx   . Rectal cancer Neg Hx   . Stomach cancer Neg Hx     Review of Systems  Constitutional: Negative for chills and fever.  Respiratory: Negative for cough, shortness of breath and wheezing.   Cardiovascular: Negative for chest pain, palpitations and leg swelling.  Neurological: Positive for headaches. Negative for light-headedness.       Objective:   Vitals:   12/30/17 1448  BP: 120/76  Pulse: 80  Resp: 16  Temp: 98.5 F (36.9 C)  SpO2: 99%   BP Readings from Last 3 Encounters:  12/30/17 120/76  11/16/17 117/72  09/27/17 118/72   Wt Readings from Last 3 Encounters:  12/30/17 268 lb (121.6 kg)  11/16/17 272 lb (123.4 kg)  09/27/17 274 lb (124.3 kg)   Body mass index is 47.47 kg/m.   Physical Exam    Constitutional: Appears well-developed and well-nourished. No distress.  HENT:  Head, eyes: Normocephalic and atraumatic.  Conjunctive are normal bilaterally without icterus, no eyelid swelling or erythema, no discharge Neck: Neck supple. No tracheal deviation present. No thyromegaly present.  No cervical lymphadenopathy Cardiovascular: Normal rate, regular rhythm and normal heart sounds.   No murmur heard. No carotid bruit .  No edema Pulmonary/Chest: Effort normal and breath sounds normal. No respiratory distress.  No has no wheezes. No rales.  Skin: Skin is warm and dry. Not diaphoretic.  Psychiatric: Normal mood and affect. Behavior is normal.      Assessment & Plan:    See Problem List for Assessment and Plan of chronic medical problems.

## 2017-12-30 ENCOUNTER — Ambulatory Visit (INDEPENDENT_AMBULATORY_CARE_PROVIDER_SITE_OTHER): Payer: Medicare Other | Admitting: Internal Medicine

## 2017-12-30 ENCOUNTER — Encounter: Payer: Self-pay | Admitting: Internal Medicine

## 2017-12-30 DIAGNOSIS — F32A Depression, unspecified: Secondary | ICD-10-CM

## 2017-12-30 DIAGNOSIS — M160 Bilateral primary osteoarthritis of hip: Secondary | ICD-10-CM

## 2017-12-30 DIAGNOSIS — H04203 Unspecified epiphora, bilateral lacrimal glands: Secondary | ICD-10-CM | POA: Diagnosis not present

## 2017-12-30 DIAGNOSIS — E1151 Type 2 diabetes mellitus with diabetic peripheral angiopathy without gangrene: Secondary | ICD-10-CM | POA: Diagnosis not present

## 2017-12-30 DIAGNOSIS — R229 Localized swelling, mass and lump, unspecified: Secondary | ICD-10-CM | POA: Diagnosis not present

## 2017-12-30 DIAGNOSIS — I10 Essential (primary) hypertension: Secondary | ICD-10-CM | POA: Diagnosis not present

## 2017-12-30 DIAGNOSIS — F329 Major depressive disorder, single episode, unspecified: Secondary | ICD-10-CM | POA: Diagnosis not present

## 2017-12-30 NOTE — Assessment & Plan Note (Signed)
Last A1c was well controlled Continue current medications She continues to lose weight and I encouraged her to continue her efforts Follow-up in 4 months

## 2017-12-30 NOTE — Assessment & Plan Note (Signed)
Doing much better on sertraline and she feels the doses good Continue sertraline 100 mg daily Follow-up in 4 months

## 2017-12-30 NOTE — Patient Instructions (Addendum)
You can take up to 3000 mg of tylenol in 24 hr.   Medications reviewed and updated.  Changes include :   none      Please followup in 4 months

## 2017-12-30 NOTE — Assessment & Plan Note (Signed)
BP well controlled Current regimen effective and well tolerated Continue current medications at current doses  

## 2017-12-30 NOTE — Assessment & Plan Note (Signed)
Left eye only Started after left upper eyelid infection Possible dry eye resulting in watering ambulatory vision at times Advised over-the-counter eyedrops and to follow-up with ophthalmologist

## 2017-12-30 NOTE — Assessment & Plan Note (Signed)
Left side of neck She noticed it 2 months ago and it has not changed Nontender Possible ingrown hair Does not look concerning and she will just monitor for now

## 2017-12-30 NOTE — Assessment & Plan Note (Signed)
Severe pain from osteoarthritis She does not want to have surgery Taking Tylenol 3 at bedtime and it does help, but has pain throughout the day Discussed options She will try Tylenol during the day-advised up to 3000 mg in total for the day Can consider low-dose NSAIDs, but discussed possible side effects with Aleve, Advil

## 2018-01-09 ENCOUNTER — Emergency Department (HOSPITAL_COMMUNITY)
Admission: EM | Admit: 2018-01-09 | Discharge: 2018-01-09 | Disposition: A | Discharge status: Home / Self Care | Payer: Medicare Other | Attending: Emergency Medicine | Admitting: Emergency Medicine

## 2018-01-09 ENCOUNTER — Encounter (HOSPITAL_COMMUNITY): Payer: Self-pay

## 2018-01-09 ENCOUNTER — Emergency Department (HOSPITAL_COMMUNITY): Payer: Medicare Other

## 2018-01-09 DIAGNOSIS — Z96653 Presence of artificial knee joint, bilateral: Secondary | ICD-10-CM | POA: Insufficient documentation

## 2018-01-09 DIAGNOSIS — R1012 Left upper quadrant pain: Secondary | ICD-10-CM | POA: Diagnosis present

## 2018-01-09 DIAGNOSIS — R319 Hematuria, unspecified: Secondary | ICD-10-CM | POA: Diagnosis not present

## 2018-01-09 DIAGNOSIS — Z7984 Long term (current) use of oral hypoglycemic drugs: Secondary | ICD-10-CM | POA: Insufficient documentation

## 2018-01-09 DIAGNOSIS — I10 Essential (primary) hypertension: Secondary | ICD-10-CM | POA: Insufficient documentation

## 2018-01-09 DIAGNOSIS — N3091 Cystitis, unspecified with hematuria: Secondary | ICD-10-CM | POA: Diagnosis not present

## 2018-01-09 DIAGNOSIS — N3090 Cystitis, unspecified without hematuria: Secondary | ICD-10-CM | POA: Diagnosis not present

## 2018-01-09 DIAGNOSIS — Z79899 Other long term (current) drug therapy: Secondary | ICD-10-CM | POA: Diagnosis not present

## 2018-01-09 DIAGNOSIS — E039 Hypothyroidism, unspecified: Secondary | ICD-10-CM | POA: Diagnosis not present

## 2018-01-09 DIAGNOSIS — R1030 Lower abdominal pain, unspecified: Secondary | ICD-10-CM | POA: Diagnosis not present

## 2018-01-09 DIAGNOSIS — E119 Type 2 diabetes mellitus without complications: Secondary | ICD-10-CM | POA: Insufficient documentation

## 2018-01-09 LAB — BASIC METABOLIC PANEL
Anion gap: 12 (ref 5–15)
BUN: 11 mg/dL (ref 8–23)
CO2: 24 mmol/L (ref 22–32)
Calcium: 9.2 mg/dL (ref 8.9–10.3)
Chloride: 104 mmol/L (ref 98–111)
Creatinine, Ser: 0.63 mg/dL (ref 0.44–1.00)
GFR calc Af Amer: >60 mL/min (ref >60)
GFR calc non Af Amer: >60 mL/min (ref >60)
GLUCOSE: 92 mg/dL (ref 70–99)
Potassium: 3.6 mmol/L (ref 3.5–5.1)
Sodium: 140 mmol/L (ref 135–145)

## 2018-01-09 LAB — URINALYSIS, ROUTINE W REFLEX MICROSCOPIC: Bacteria, UA: NONE SEEN

## 2018-01-09 LAB — CBC WITH DIFFERENTIAL/PLATELET
Abs Immature Granulocytes: 0.03 10*3/uL (ref 0.00–0.07)
Basophils Absolute: 0.1 10*3/uL (ref 0.0–0.1)
Basophils Relative: 0 %
Eosinophils Absolute: 0.1 10*3/uL (ref 0.0–0.5)
Eosinophils Relative: 1 %
HCT: 49.3 % — ABNORMAL HIGH (ref 36.0–46.0)
Hemoglobin: 15.4 g/dL — ABNORMAL HIGH (ref 12.0–15.0)
Immature Granulocytes: 0 %
LYMPHS PCT: 23 %
Lymphs Abs: 2.5 10*3/uL (ref 0.7–4.0)
MCH: 26.9 pg (ref 26.0–34.0)
MCHC: 31.2 g/dL (ref 30.0–36.0)
MCV: 86 fL (ref 80.0–100.0)
Monocytes Absolute: 0.9 10*3/uL (ref 0.1–1.0)
Monocytes Relative: 8 %
Neutro Abs: 7.6 10*3/uL (ref 1.7–7.7)
Neutrophils Relative %: 68 %
Platelets: 271 10*3/uL (ref 150–400)
RBC: 5.73 MIL/uL — ABNORMAL HIGH (ref 3.87–5.11)
RDW: 15.7 % — ABNORMAL HIGH (ref 11.5–15.5)
WBC: 11.2 10*3/uL — ABNORMAL HIGH (ref 4.0–10.5)
nRBC: 0 % (ref 0.0–0.2)

## 2018-01-09 MED ORDER — OXYBUTYNIN CHLORIDE 5 MG PO TABS
5.0000 mg | ORAL_TABLET | Freq: Once | ORAL | Status: AC
  Administered 2018-01-09: 5 mg via ORAL
  Filled 2018-01-09: qty 1

## 2018-01-09 MED ORDER — SODIUM CHLORIDE 0.9 % IV BOLUS
500.0000 mL | Freq: Once | INTRAVENOUS | Status: AC
  Administered 2018-01-09: 500 mL via INTRAVENOUS

## 2018-01-09 MED ORDER — CEPHALEXIN 500 MG PO CAPS: 500.0000 mg | ORAL_CAPSULE | Freq: Two times a day (BID) | ORAL | 0 refills | Status: AC

## 2018-01-09 MED ORDER — CEPHALEXIN 500 MG PO CAPS
500.0000 mg | ORAL_CAPSULE | Freq: Once | ORAL | Status: AC
  Administered 2018-01-09: 500 mg via ORAL
  Filled 2018-01-09: qty 1

## 2018-01-09 MED ORDER — OXYBUTYNIN CHLORIDE ER 10 MG PO TB24: 10.0000 mg | ORAL_TABLET | Freq: Every day | ORAL | 0 refills | Status: AC

## 2018-01-09 MED ORDER — PHENAZOPYRIDINE HCL 200 MG PO TABS
200.0000 mg | ORAL_TABLET | Freq: Once | ORAL | Status: AC
  Administered 2018-01-09: 200 mg via ORAL
  Filled 2018-01-09: qty 1

## 2018-01-09 NOTE — ED Provider Notes (Signed)
[facility] DEPT Provider Note   CSN: [id number] Arrival date & time: 01/09/18  1723     History   Chief Complaint Chief Complaint  Patient presents with  . Flank Pain  . Hematuria    HPI Alexis Grant is a 67 y.o. female.  67 yo female presents with complaint of hematuria. Patient reports frequency and left flank pain onset yesterday, developed hematuria with clots last night with dysuria. Also nausea without vomiting, denies abdominal pain, fevers. Patient had a similar episode about 3 months ago, seen by PCP and treated with abx (told she did not have a UTI but symptoms improved with abx), referred to urology and scheduled to be seen in early January. Patient is not on blood thinners. No other complaints or concerns.      Past Medical History:  Diagnosis Date  . Adenomatous polyp of colon 2008  . Allergy   . Anemia   . Anxiety   . Arthritis   . Blood transfusion without reported diagnosis    following knee surgery   . Cataract    hx- removed both eyes  . Depression   . Diabetes mellitus    type II   . Dyslipidemia   . Fibromyalgia   . Gallstones   . Gastritis   . GERD (gastroesophageal reflux disease)   . Hemangioma   . Hiatal hernia    Fibromyalgia  . Hyperlipidemia   . Hypertension   . Hypothyroidism 2010   Dr.Deveshawr  . IBS (irritable bowel syndrome)    Dr Fuller Plan  . Migraines   . Myocardial infarct ([place]) 2010   subendocardrial, following R TKR  . Obesity   . OSA (obstructive sleep apnea)    cpap - does not know settings   . Pneumonia   . Seizure disorder ([place])   . Seizures ([place])    last seizure was 2002 per pt 07-29-16  . Sleep apnea   . Urinary tract infection     Patient Active Problem List   Diagnosis Date Noted  . Lump of skin 12/30/2017  . Watery eyes 12/30/2017  . Polyneuropathy 09/27/2017  . Osteoarthritis, hip, bilateral 05/25/2017  . Osteopenia 11/22/2016  . GERD (gastroesophageal reflux disease)  11/15/2016  . Difficulty sleeping 05/24/2016  . Rosacea 11/25/2015  . Poor balance 11/25/2015  . Tremor 04/18/2015  . Other fatigue 02/17/2015  . Migraine with aura and without status migrainosus, not intractable 12/04/2014  . Localization-related (focal) (partial) idiopathic epilepsy and epileptic syndromes with seizures of localized onset, not intractable, without status epilepticus ([place]) 12/04/2014  . Hx of adenomatous colonic polyps 07/16/2014  . Benign neoplasm of ascending colon 07/16/2014  . Benign neoplasm of descending colon 07/16/2014  . Benign neoplasm of cecum 07/16/2014  . Benign neoplasm of transverse colon 07/16/2014  . Fibromyalgia 05/30/2013  . Generalized convulsive epilepsy ([place]) 10/20/2012  . Seasonal allergies 12/08/2011  . DM (diabetes mellitus), type 2 with peripheral vascular complications ([place]) 18/84/1660  . Essential hypertension 01/05/2010  . Irritable bowel syndrome 01/05/2010  . VERTIGO 09/16/2009  . VITAMIN D DEFICIENCY 02/24/2009  . MYOCARDIAL INFARCTION, ACUTE, SUBENDOCARDIAL 10/04/2008  . Hypothyroidism 03/19/2008  . COLONIC POLYPS, ADENOMATOUS, HX OF 08/07/2007  . HEMANGIOMA 04/26/2007  . Dyslipidemia 04/26/2007  . OBESITY 04/26/2007  . Depression 04/26/2007  . SLEEP APNEA, OBSTRUCTIVE 04/26/2007  . HIATAL HERNIA 04/26/2007  . FATTY LIVER DISEASE 04/26/2007  . ARTHRITIS 04/26/2007    Past Surgical History:  Procedure Laterality Date  . ABDOMINAL  HYSTERECTOMY  1988  . CARDIAC CATHETERIZATION  2007   DrGamble ( now seeing Dr Tonita Cong cardiology)  . CHOLECYSTECTOMY  1985  . COLONOSCOPY    . COLONOSCOPY WITH PROPOFOL N/A 07/16/2014   Procedure: COLONOSCOPY WITH PROPOFOL;  Surgeon: Ladene Artist, MD;  Location: [facility];  Service: Endoscopy;  Laterality: N/A;  . COLONOSCOPY WITH PROPOFOL N/A 09/21/2016   Procedure: COLONOSCOPY WITH PROPOFOL;  Surgeon: Ladene Artist, MD;  Location: [facility];  Service: Endoscopy;  Laterality: N/A;  .  POLYPECTOMY  2012   6 or 7 adenomatous, Dr.Stark  . steroid injection to left si joint  12/2008   Dr.Newton  . TOTAL KNEE ARTHROPLASTY Right 11/2005  . TOTAL KNEE ARTHROPLASTY Left   . TUBAL LIGATION  1986     OB History   No obstetric history on file.      Home Medications    Prior to Admission medications   Medication Sig Start Date End Date Taking? Authorizing Provider  acetaminophen-codeine (TYLENOL #3) 300-30 MG tablet TAKE 1 TO 2 TABLETS BY MOUTH AT BEDTIME FOR  CHRONIC  HIP  PAIN Patient taking differently: Take 2 tablets by mouth at bedtime.  12/28/17  Yes Burns, Claudina Lick, MD  divalproex (DEPAKOTE) 500 MG DR tablet TAKE 1 TABLET BY MOUTH DAILY IN THE MORNING AND 2 TABS IN THE EVENING Patient taking differently: Take 500 mg by mouth 2 (two) times daily.  10/21/17  Yes Cameron Sprang, MD  empagliflozin (JARDIANCE) 25 MG TABS tablet Take 25 mg by mouth daily. 06/16/17  Yes Burns, Claudina Lick, MD  levothyroxine (SYNTHROID, LEVOTHROID) 75 MCG tablet TAKE 1 TABLET BY MOUTH ONCE DAILY BEFORE BREAKFAST Patient taking differently: Take 75 mcg by mouth daily before breakfast.  12/20/17  Yes Burns, Claudina Lick, MD  losartan (COZAAR) 50 MG tablet TAKE 1 TABLET BY MOUTH ONCE DAILY 09/21/17  Yes Burns, Claudina Lick, MD  metFORMIN (GLUCOPHAGE) 1000 MG tablet TAKE 1 TABLET BY MOUTH ONCE DAILY WITH BREAKFAST Patient taking differently: Take 500 mg by mouth daily with breakfast.  11/11/17  Yes Burns, Claudina Lick, MD  omeprazole (PRILOSEC) 40 MG capsule TAKE 1 CAPSULE BY MOUTH ONCE DAILY 09/21/17  Yes Burns, Claudina Lick, MD  sertraline (ZOLOFT) 50 MG tablet Take 50 mg nightly for two weeks and then increase to 100 mg nightly Patient taking differently: Take 100 mg by mouth at bedtime.  09/27/17  Yes Burns, Claudina Lick, MD  simvastatin (ZOCOR) 20 MG tablet TAKE 1 TABLET BY MOUTH AT BEDTIME 01/19/17  Yes Burns, Claudina Lick, MD  topiramate (TOPAMAX) 25 MG tablet Take 1 tablet in AM, 2 tablets in PM Patient taking differently: Take  25-50 mg by mouth 2 (two) times daily. Take 1 tablet in AM, 2 tablets in PM 07/05/17  Yes Cameron Sprang, MD  traZODone (DESYREL) 50 MG tablet TAKE 2 TABLETS BY MOUTH AT BEDTIME FOR SLEEP Patient taking differently: Take 100 mg by mouth at bedtime.  12/05/17  Yes Burns, Claudina Lick, MD  cephALEXin (KEFLEX) 500 MG capsule Take 1 capsule (500 mg total) by mouth 2 (two) times daily for 5 days. 01/09/18 01/14/18  Tacy Learn, PA-C  Dulaglutide (TRULICITY) 1.5 ZO/1.0RU SOPN Inject 1.5 mg into the skin once a week. 06/03/17   Binnie Rail, MD  ondansetron (ZOFRAN ODT) 4 MG disintegrating tablet Take 1 tablet (4 mg total) by mouth every 8 (eight) hours as needed for nausea or vomiting. 08/12/17   Charlann Lange, PA-C  oxybutynin (DITROPAN XL) 10 MG 24 hr tablet Take 1 tablet (10 mg total) by mouth at bedtime for 5 days. 01/09/18 01/14/18  Tacy Learn, PA-C    Family History Family History  Problem Relation Age of Onset  . Colon polyps Mother   . Ulcers Father   . Colon polyps Brother   . Diabetes Sister   . Diabetes Paternal Aunt   . Esophageal cancer Brother   . Kidney cancer Sister   . Colon cancer Neg Hx   . Heart attack Neg Hx   . Stroke Neg Hx   . Cancer Neg Hx   . Gallbladder disease Neg Hx   . Rectal cancer Neg Hx   . Stomach cancer Neg Hx     Social History Social History   Tobacco Use  . Smoking status: Never Smoker  . Smokeless tobacco: Never Used  Substance Use Topics  . Alcohol use: No    Alcohol/week: 0.0 standard drinks  . Drug use: No     Allergies   Hydrocodone-acetaminophen   Review of Systems Review of Systems  Constitutional: Negative for fever.  Gastrointestinal: Positive for nausea. Negative for abdominal distention, abdominal pain, constipation, diarrhea and vomiting.  Genitourinary: Positive for difficulty urinating, dysuria, flank pain and frequency.  Skin: Negative for rash and wound.  Allergic/Immunologic: Negative for immunocompromised state.   Psychiatric/Behavioral: Negative for confusion.  All other systems reviewed and are negative.    Physical Exam Updated Vital Signs BP 119/68   Pulse 82   Temp 98.3 F (36.8 C) (Oral)   Resp 16   Ht 5\' 3"  (1.6 m)   Wt 119.3 kg   SpO2 99%   BMI 46.59 kg/m   Physical Exam Vitals signs and nursing note reviewed.  Constitutional:      General: She is not in acute distress.    Appearance: Normal appearance. She is well-developed. She is obese. She is not ill-appearing or diaphoretic.  HENT:     Head: Normocephalic and atraumatic.  Cardiovascular:     Rate and Rhythm: Normal rate and regular rhythm.     Pulses: Normal pulses.     Heart sounds: Normal heart sounds. No murmur.  Pulmonary:     Effort: Pulmonary effort is normal.     Breath sounds: Normal breath sounds.  Abdominal:     Tenderness: There is abdominal tenderness in the suprapubic area. There is no right CVA tenderness or left CVA tenderness.  Skin:    General: Skin is warm and dry.  Neurological:     Mental Status: She is alert and oriented to person, place, and time.  Psychiatric:        Behavior: Behavior normal.      ED Treatments / Results  Labs (all labs ordered are listed, but only abnormal results are displayed) Labs Reviewed  URINALYSIS, ROUTINE W REFLEX MICROSCOPIC - Abnormal; Notable for the following components:      Result Value   Color, Urine RED (*)    APPearance CLOUDY (*)    Glucose, UA   (*)    Value: TEST NOT REPORTED DUE TO COLOR INTERFERENCE OF URINE PIGMENT   Hgb urine dipstick   (*)    Value: TEST NOT REPORTED DUE TO COLOR INTERFERENCE OF URINE PIGMENT   Bilirubin Urine   (*)    Value: TEST NOT REPORTED DUE TO COLOR INTERFERENCE OF URINE PIGMENT   Ketones, ur   (*)    Value: TEST NOT REPORTED DUE TO  COLOR INTERFERENCE OF URINE PIGMENT   Protein, ur   (*)    Value: TEST NOT REPORTED DUE TO COLOR INTERFERENCE OF URINE PIGMENT   Nitrite   (*)    Value: TEST NOT REPORTED DUE TO  COLOR INTERFERENCE OF URINE PIGMENT   Leukocytes, UA   (*)    Value: TEST NOT REPORTED DUE TO COLOR INTERFERENCE OF URINE PIGMENT   RBC / HPF >50 (*)    All other components within normal limits  CBC WITH DIFFERENTIAL/PLATELET - Abnormal; Notable for the following components:   WBC 11.2 (*)    RBC 5.73 (*)    Hemoglobin 15.4 (*)    HCT 49.3 (*)    RDW 15.7 (*)    All other components within normal limits  URINE CULTURE  BASIC METABOLIC PANEL    EKG None  Radiology Ct Abdomen Pelvis Wo Contrast  Result Date: 01/09/2018 CLINICAL DATA:  Hematuria with passage of clots EXAM: CT ABDOMEN AND PELVIS WITHOUT CONTRAST TECHNIQUE: Multidetector CT imaging of the abdomen and pelvis was performed following the standard protocol without IV contrast. COMPARISON:  08/30/2017 FINDINGS: Lower chest: No focal infiltrate or sizable effusion is noted. Hepatobiliary: Gallbladder has been surgically removed. No significant biliary ductal dilatation is seen. Some vague hypodensities are noted in the posterior aspect of the right lobe with some associated calcifications similar to that seen on multiple previous exams dating back to 2016 as well as an MRI from 20/12 consistent with hemangiomas. Pancreas: Unremarkable. No pancreatic ductal dilatation or surrounding inflammatory changes. Spleen: Normal in size without focal abnormality. Adrenals/Urinary Tract: Adrenal glands are within normal limits. No renal calculi or obstructive changes are seen. No definitive mass lesion is noted. The ureters are within normal limits. The bladder is decompressed. Stomach/Bowel: The appendix is within normal limits. No obstructive or inflammatory changes of the large or small bowel are seen. The stomach is within normal limits. Vascular/Lymphatic: Aortic atherosclerosis. No enlarged abdominal or pelvic lymph nodes. Reproductive: Status post hysterectomy. No adnexal masses. Other: No abdominal wall hernia or abnormality. No  abdominopelvic ascites. Musculoskeletal: Degenerative changes of lumbar spine are seen. IMPRESSION: No renal calculi or obstructive changes are noted. No specific bladder abnormality is noted although the bladder is decompressed. Stable hypodensities within the liver consistent with benign hemangiomas as previously seen. No acute abnormality noted. Electronically Signed   By: Inez Catalina M.D.   On: 01/09/2018 19:29    Procedures Procedures (including critical care time)  Medications Ordered in ED Medications  oxybutynin (DITROPAN) tablet 5 mg (has no administration in time range)  cephALEXin (KEFLEX) capsule 500 mg (has no administration in time range)  sodium chloride 0.9 % bolus 500 mL (0 mLs Intravenous Stopped 01/09/18 1951)  phenazopyridine (PYRIDIUM) tablet 200 mg (200 mg Oral Given 01/09/18 1837)     Initial Impression / Assessment and Plan / ED Course  I have reviewed the triage vital signs and the nursing notes.  Pertinent labs & imaging results that were available during my care of the patient were reviewed by me and considered in my medical decision making (see chart for details).  Clinical Course as of Jan 10 2152  Mon Jan 10, 4560  1243 67 year old female presents with complaint of urinary frequency, dysuria, hematuria.  Patient reports one prior episode previously and is awaiting evaluation by urology, currently scheduled for January.  Patient has slight discomfort in her suprapubic abdomen otherwise abdomen is soft and nontender.  BMP is unremarkable, CBC without significant  findings, urinalysis testing is not reliable due to degree of hematuria however is positive for red blood cells and squamous epithelials with no bacteria seen.  Patient states last him she had this same episode her symptoms resolved with antibiotics.  Patient replaced on short course of Keflex pending urine culture report with referral to urology.  Advised to return to ER for any new or worsening symptoms.     [LM]    Clinical Course User Index [LM] Tacy Learn, PA-C   Final Clinical Impressions(s) / ED Diagnoses   Final diagnoses:  Hemorrhagic cystitis    ED Discharge Orders         Ordered    cephALEXin (KEFLEX) 500 MG capsule  2 times daily     01/09/18 2148    oxybutynin (DITROPAN XL) 10 MG 24 hr tablet  Daily at bedtime     01/09/18 2148           Tacy Learn, PA-C 01/09/18 2153    Maudie Flakes, MD 01/09/18 2356

## 2018-01-09 NOTE — Discharge Instructions (Addendum)
Take Keflex as prescribed and complete the full course. Take Ditropan as prescribed for bladder spasm. Follow-up with urology as soon as possible.  Return to ER for new or worsening symptoms.

## 2018-01-09 NOTE — ED Notes (Signed)
Pt attempted to provide urine sample but was unsuccessful. Stated 3 quarter-size clots came out when attempting to void. RN made aware.

## 2018-01-09 NOTE — ED Triage Notes (Signed)
Patient c/o clots in her urine. Patient states when she voids it is only blood that has gotten worse and frequent.   Started last night.  Left side flank pain (5/10) Pain during urination (8/10)  Denies hx. Kidney stones.  States 3 months ago she has similar symptoms but no findings of stones.

## 2018-01-11 LAB — URINE CULTURE

## 2018-01-19 ENCOUNTER — Encounter: Payer: Self-pay | Admitting: Neurology

## 2018-01-19 ENCOUNTER — Ambulatory Visit: Payer: Medicare Other | Admitting: Neurology

## 2018-01-25 ENCOUNTER — Other Ambulatory Visit: Payer: Self-pay | Admitting: Internal Medicine

## 2018-01-26 ENCOUNTER — Other Ambulatory Visit: Payer: Self-pay | Admitting: Internal Medicine

## 2018-01-27 NOTE — Telephone Encounter (Signed)
[place] Controlled Substance Database checked. Last filled on 12/29/17  Last OV 12/30/17

## 2018-02-06 ENCOUNTER — Other Ambulatory Visit: Payer: Self-pay | Admitting: Internal Medicine

## 2018-02-06 MED ORDER — SERTRALINE HCL 100 MG PO TABS: 100.0000 mg | ORAL_TABLET | Freq: Every day | ORAL | 3 refills | Status: DC

## 2018-02-25 ENCOUNTER — Other Ambulatory Visit: Payer: Self-pay | Admitting: Internal Medicine

## 2018-02-27 NOTE — Telephone Encounter (Signed)
Last refill was 01/27/18 Last OV 09/27/17 Next OV 05/03/18

## 2018-03-01 ENCOUNTER — Other Ambulatory Visit: Payer: Self-pay | Admitting: Internal Medicine

## 2018-03-03 ENCOUNTER — Encounter: Payer: Self-pay | Admitting: Nurse Practitioner

## 2018-03-03 ENCOUNTER — Ambulatory Visit (INDEPENDENT_AMBULATORY_CARE_PROVIDER_SITE_OTHER): Payer: Medicare Other | Admitting: Nurse Practitioner

## 2018-03-03 ENCOUNTER — Other Ambulatory Visit: Payer: Medicare Other

## 2018-03-03 VITALS — BP 120/76 | HR 86 | Temp 98.2°F | Ht 63.0 in | Wt 272.0 lb

## 2018-03-03 DIAGNOSIS — R319 Hematuria, unspecified: Secondary | ICD-10-CM

## 2018-03-03 LAB — POCT URINALYSIS DIPSTICK
BILIRUBIN UA: NEGATIVE
Glucose, UA: POSITIVE — AB
KETONES UA: NEGATIVE
Leukocytes, UA: NEGATIVE
Nitrite, UA: NEGATIVE
Protein, UA: NEGATIVE
Spec Grav, UA: 1.015 (ref 1.010–1.025)
UROBILINOGEN UA: 0.2 U/dL
pH, UA: 6.5 (ref 5.0–8.0)

## 2018-03-03 MED ORDER — NITROFURANTOIN MONOHYD MACRO 100 MG PO CAPS: 100.0000 mg | ORAL_CAPSULE | Freq: Two times a day (BID) | ORAL | 0 refills | Status: DC

## 2018-03-03 NOTE — Progress Notes (Signed)
LYLLA EIFLER is a 68 y.o. female with the following history as recorded in [employer]:  Patient Active Problem List   Diagnosis Date Noted  . Lump of skin 12/30/2017  . Watery eyes 12/30/2017  . Polyneuropathy 09/27/2017  . Osteoarthritis, hip, bilateral 05/25/2017  . Osteopenia 11/22/2016  . GERD (gastroesophageal reflux disease) 11/15/2016  . Difficulty sleeping 05/24/2016  . Rosacea 11/25/2015  . Poor balance 11/25/2015  . Tremor 04/18/2015  . Other fatigue 02/17/2015  . Migraine with aura and without status migrainosus, not intractable 12/04/2014  . Localization-related (focal) (partial) idiopathic epilepsy and epileptic syndromes with seizures of localized onset, not intractable, without status epilepticus ([place]) 12/04/2014  . Hx of adenomatous colonic polyps 07/16/2014  . Benign neoplasm of ascending colon 07/16/2014  . Benign neoplasm of descending colon 07/16/2014  . Benign neoplasm of cecum 07/16/2014  . Benign neoplasm of transverse colon 07/16/2014  . Fibromyalgia 05/30/2013  . Generalized convulsive epilepsy ([place]) 10/20/2012  . Seasonal allergies 12/08/2011  . DM (diabetes mellitus), type 2 with peripheral vascular complications ([place]) 29/52/8413  . Essential hypertension 01/05/2010  . Irritable bowel syndrome 01/05/2010  . VERTIGO 09/16/2009  . VITAMIN D DEFICIENCY 02/24/2009  . MYOCARDIAL INFARCTION, ACUTE, SUBENDOCARDIAL 10/04/2008  . Hypothyroidism 03/19/2008  . COLONIC POLYPS, ADENOMATOUS, HX OF 08/07/2007  . HEMANGIOMA 04/26/2007  . Dyslipidemia 04/26/2007  . OBESITY 04/26/2007  . Depression 04/26/2007  . SLEEP APNEA, OBSTRUCTIVE 04/26/2007  . HIATAL HERNIA 04/26/2007  . FATTY LIVER DISEASE 04/26/2007  . ARTHRITIS 04/26/2007    Current Outpatient Medications  Medication Sig Dispense Refill  . acetaminophen-codeine (TYLENOL #3) 300-30 MG tablet TAKE 1 TO 2 TABLETS BY MOUTH AT BEDTIME FOR CHRONIC HIP PAIN 60 tablet 0  . divalproex (DEPAKOTE) 500 MG DR  tablet TAKE 1 TABLET BY MOUTH DAILY IN THE MORNING AND 2 TABS IN THE EVENING (Patient taking differently: Take 500 mg by mouth 2 (two) times daily. ) 90 tablet 11  . empagliflozin (JARDIANCE) 25 MG TABS tablet Take 25 mg by mouth daily. 90 tablet 1  . levothyroxine (SYNTHROID, LEVOTHROID) 75 MCG tablet TAKE 1 TABLET BY MOUTH ONCE DAILY BEFORE BREAKFAST (Patient taking differently: Take 75 mcg by mouth daily before breakfast. ) 90 tablet 1  . losartan (COZAAR) 50 MG tablet TAKE 1 TABLET BY MOUTH ONCE DAILY 90 tablet 1  . metFORMIN (GLUCOPHAGE) 1000 MG tablet TAKE 1 TABLET BY MOUTH ONCE DAILY WITH BREAKFAST (Patient taking differently: Take 500 mg by mouth daily with breakfast. ) 90 tablet 1  . omeprazole (PRILOSEC) 40 MG capsule TAKE 1 CAPSULE BY MOUTH ONCE DAILY 90 capsule 1  . ondansetron (ZOFRAN ODT) 4 MG disintegrating tablet Take 1 tablet (4 mg total) by mouth every 8 (eight) hours as needed for nausea or vomiting. 20 tablet 0  . sertraline (ZOLOFT) 100 MG tablet Take 1 tablet (100 mg total) by mouth daily. 30 tablet 3  . simvastatin (ZOCOR) 20 MG tablet TAKE 1 TABLET BY MOUTH AT BEDTIME 90 tablet 0  . topiramate (TOPAMAX) 25 MG tablet Take 1 tablet in AM, 2 tablets in PM (Patient taking differently: Take 25-50 mg by mouth 2 (two) times daily. Take 1 tablet in AM, 2 tablets in PM) 90 tablet 11  . traZODone (DESYREL) 50 MG tablet TAKE 2 TABLETS BY MOUTH AT BEDTIME FOR SLEEP (Patient taking differently: Take 100 mg by mouth at bedtime. ) 180 tablet 1  . TRULICITY 1.5 KG/4.0NU SOPN INJECT 1.5 MG INTO THE SKIN ONCE A WEEK  4 mL 2  . nitrofurantoin, macrocrystal-monohydrate, (MACROBID) 100 MG capsule Take 1 capsule (100 mg total) by mouth 2 (two) times daily. 10 capsule 0  . sertraline (ZOLOFT) 50 MG tablet Take 50 mg nightly for two weeks and then increase to 100 mg nightly (Patient not taking: Reported on 03/03/2018) 60 tablet 3   No current facility-administered medications for this visit.      Allergies: Hydrocodone-acetaminophen  Past Medical History:  Diagnosis Date  . Adenomatous polyp of colon 2008  . Allergy   . Anemia   . Anxiety   . Arthritis   . Blood transfusion without reported diagnosis    following knee surgery   . Cataract    hx- removed both eyes  . Depression   . Diabetes mellitus    type II   . Dyslipidemia   . Fibromyalgia   . Gallstones   . Gastritis   . GERD (gastroesophageal reflux disease)   . Hemangioma   . Hiatal hernia    Fibromyalgia  . Hyperlipidemia   . Hypertension   . Hypothyroidism 2010   Dr.Deveshawr  . IBS (irritable bowel syndrome)    Dr Fuller Plan  . Migraines   . Myocardial infarct ([place]) 2010   subendocardrial, following R TKR  . Obesity   . OSA (obstructive sleep apnea)    cpap - does not know settings   . Pneumonia   . Seizure disorder ([place])   . Seizures ([place])    last seizure was 2002 per pt 07-29-16  . Sleep apnea   . Urinary tract infection     Past Surgical History:  Procedure Laterality Date  . ABDOMINAL HYSTERECTOMY  1988  . CARDIAC CATHETERIZATION  2007   DrGamble ( now seeing Dr Tonita Cong cardiology)  . CHOLECYSTECTOMY  1985  . COLONOSCOPY    . COLONOSCOPY WITH PROPOFOL N/A 07/16/2014   Procedure: COLONOSCOPY WITH PROPOFOL;  Surgeon: Ladene Artist, MD;  [facility];  Service: Endoscopy;  Laterality: N/A;  . COLONOSCOPY WITH PROPOFOL N/A 09/21/2016   Procedure: COLONOSCOPY WITH PROPOFOL;  Surgeon: Ladene Artist, MD;  Location: [facility];  Service: Endoscopy;  Laterality: N/A;  . POLYPECTOMY  2012   6 or 7 adenomatous, Dr.Stark  . steroid injection to left si joint  12/2008   Dr.Newton  . TOTAL KNEE ARTHROPLASTY Right 11/2005  . TOTAL KNEE ARTHROPLASTY Left   . TUBAL LIGATION  1986    Family History  Problem Relation Age of Onset  . Colon polyps Mother   . Ulcers Father   . Colon polyps Brother   . Diabetes Sister   . Diabetes Paternal Aunt   . Esophageal cancer Brother   . Kidney  cancer Sister   . Colon cancer Neg Hx   . Heart attack Neg Hx   . Stroke Neg Hx   . Cancer Neg Hx   . Gallbladder disease Neg Hx   . Rectal cancer Neg Hx   . Stomach cancer Neg Hx     Social History   Tobacco Use  . Smoking status: Never Smoker  . Smokeless tobacco: Never Used  Substance Use Topics  . Alcohol use: No    Alcohol/week: 0.0 standard drinks     Subjective:  Ms Segall Is here today requesting evaluation of urinary problem- hematuria, dysuria, bladder spasms, which first began this past Sunday, hematuria has improved but continues to have dysuria/discomfort. She reports about 4 UTIS over past several months, actually scheduled to see urologist  next month for eval of this She has not tried anything at home for her symptoms Of note, she is on jardiance for DM  Review of Systems  Constitutional: Positive for chills.  Gastrointestinal: Positive for nausea. Negative for abdominal pain, constipation, diarrhea and vomiting.  Genitourinary: Positive for dysuria, frequency, hematuria and urgency. Negative for flank pain.  Musculoskeletal: Negative for back pain.  Neurological: Negative for dizziness, loss of consciousness and weakness.  No vaginal discharge, bleeding.  Objective:  Vitals:   03/03/18 1005  BP: 120/76  Pulse: 86  Temp: 98.2 F (36.8 C)  TempSrc: Oral  SpO2: 94%  Weight: 272 lb (123.4 kg)  Height: 5\' 3"  (1.6 m)    General: Well developed, well nourished, in no acute distress  Skin : Warm and dry.  Head: Normocephalic and atraumatic  Eyes: Sclera and conjunctiva clear; pupils round and reactive to light; extraocular movements intact  Oropharynx: Pink, supple. No suspicious lesions  Neck: Supple Lungs: Respirations unlabored; clear to auscultation bilaterally without wheeze, rales, rhonchi  CVS exam: normal rate and regular rhythm, S1 and S2 normal.  Abdomen: Soft; nontender; rotund; no masses or hepatosplenomegaly; no CVA tenderness Extremities:  No edema, cyanosis, clubbing  Vessels: Symmetric bilaterally  Neurologic: Alert and oriented; speech intact; face symmetrical; moves all extremities well; CNII-XII intact without focal deficit  Psychiatric: Normal mood and affect.  Assessment:  1. Hematuria, unspecified type     Plan:   POCT urine today + for blood, glucose Based on symptoms, urine testing, will start abx course Urine culture sent Keep planned urology f/u Also instructed to schedule PCP f/u to discuss jardiance as possible contributor to her recurrent UTIs Home management, red flags and return precautions including when to seek immediate care discussed and printed on AVS   No follow-ups on file.  Orders Placed This Encounter  Procedures  . Urine Culture    Standing Status:   Future    Standing Expiration Date:   04/01/2018  . POCT urinalysis dipstick    Requested Prescriptions   Signed Prescriptions Disp Refills  . nitrofurantoin, macrocrystal-monohydrate, (MACROBID) 100 MG capsule 10 capsule 0    Sig: Take 1 capsule (100 mg total) by mouth 2 (two) times daily.

## 2018-03-03 NOTE — Patient Instructions (Signed)
Complete macrobid as prescribed  Continue with urology follow up  Schedule a follow up with Dr Quay Burow to talk about your jardiance, this could cause frequent UTI as we discussed   Urinary Tract Infection, Adult A urinary tract infection (UTI) is an infection of any part of the urinary tract. The urinary tract includes:  The kidneys.  The ureters.  The bladder.  The urethra. These organs make, store, and get rid of pee (urine) in the body. What are the causes? This is caused by germs (bacteria) in your genital area. These germs grow and cause swelling (inflammation) of your urinary tract. What increases the risk? You are more likely to develop this condition if:  You have a small, thin tube (catheter) to drain pee.  You cannot control when you pee or poop (incontinence).  You are female, and: ? You use these methods to prevent pregnancy: ? A medicine that kills sperm (spermicide). ? A device that blocks sperm (diaphragm). ? You have low levels of a female hormone (estrogen). ? You are pregnant.  You have genes that add to your risk.  You are sexually active.  You take antibiotic medicines.  You have trouble peeing because of: ? A prostate that is bigger than normal, if you are female. ? A blockage in the part of your body that drains pee from the bladder (urethra). ? A kidney stone. ? A nerve condition that affects your bladder (neurogenic bladder). ? Not getting enough to drink. ? Not peeing often enough.  You have other conditions, such as: ? Diabetes. ? A weak disease-fighting system (immune system). ? Sickle cell disease. ? Gout. ? Injury of the spine. What are the signs or symptoms? Symptoms of this condition include:  Needing to pee right away (urgently).  Peeing often.  Peeing small amounts often.  Pain or burning when peeing.  Blood in the pee.  Pee that smells bad or not like normal.  Trouble peeing.  Pee that is cloudy.  Fluid coming from  the vagina, if you are female.  Pain in the belly or lower back. Other symptoms include:  Throwing up (vomiting).  No urge to eat.  Feeling mixed up (confused).  Being tired and grouchy (irritable).  A fever.  Watery poop (diarrhea). How is this treated? This condition may be treated with:  Antibiotic medicine.  Other medicines.  Drinking enough water. Follow these instructions at home:  Medicines  Take over-the-counter and prescription medicines only as told by your doctor.  If you were prescribed an antibiotic medicine, take it as told by your doctor. Do not stop taking it even if you start to feel better. General instructions  Make sure you: ? Pee until your bladder is empty. ? Do not hold pee for a long time. ? Empty your bladder after sex. ? Wipe from front to back after pooping if you are a female. Use each tissue one time when you wipe.  Drink enough fluid to keep your pee pale yellow.  Keep all follow-up visits as told by your doctor. This is important. Contact a doctor if:  You do not get better after 1-2 days.  Your symptoms go away and then come back. Get help right away if:  You have very bad back pain.  You have very bad pain in your lower belly.  You have a fever.  You are sick to your stomach (nauseous).  You are throwing up. Summary  A urinary tract infection (UTI) is an infection  of any part of the urinary tract.  This condition is caused by germs in your genital area.  There are many risk factors for a UTI. These include having a small, thin tube to drain pee and not being able to control when you pee or poop.  Treatment includes antibiotic medicines for germs.  Drink enough fluid to keep your pee pale yellow. This information is not intended to replace advice given to you by your health care provider. Make sure you discuss any questions you have with your health care provider. Document Released: 06/23/2007 Document Revised:  07/14/2017 Document Reviewed: 07/14/2017 Elsevier Interactive Patient Education  2019 [employer].

## 2018-03-05 LAB — URINE CULTURE
MICRO NUMBER:: 197263
SPECIMEN QUALITY:: ADEQUATE

## 2018-03-17 ENCOUNTER — Other Ambulatory Visit: Payer: Self-pay | Admitting: Internal Medicine

## 2018-03-31 ENCOUNTER — Other Ambulatory Visit: Payer: Self-pay | Admitting: Internal Medicine

## 2018-04-03 NOTE — Telephone Encounter (Signed)
Last refill was 02/28/18 Last OV 12/31/18 Next OV 05/03/18

## 2018-04-25 ENCOUNTER — Other Ambulatory Visit: Payer: Self-pay | Admitting: Internal Medicine

## 2018-04-26 ENCOUNTER — Other Ambulatory Visit: Payer: Self-pay | Admitting: Internal Medicine

## 2018-05-01 ENCOUNTER — Other Ambulatory Visit: Payer: Self-pay | Admitting: Internal Medicine

## 2018-05-02 NOTE — Progress Notes (Signed)
Virtual Visit via Video Note  I connected with [name] on 05/02/18 at  9:45 AM EDT by a video enabled telemedicine application and verified that I am speaking with the correct person using two identifiers.   I discussed the limitations of evaluation and management by telemedicine and the availability of in person appointments. The patient expressed understanding and agreed to proceed.  The patient is currently at home and I am in the office.    No referring provider.    History of Present Illness: She is here for follow up of her chronic medical conditions.   She is exercising a little.  She walks around her block, but is limited with how far she can walk because of her hip pain..    Diabetes: She is taking her medication daily as prescribed. She is compliant with a diabetic diet.   She has lost a little bit of weight since being home.  She has not checked her sugars - she needs a new glucometer.  Hypertension: She is taking her medication daily. She is compliant with a low sodium diet.  She denies chest pain, palpitations, edema, shortness of breath and regular headaches. She does not monitor her blood pressure at home.    Depression: She is taking her medication daily as prescribed. She denies any side effects from the medication. She feels her depression is well controlled and she is happy with her current dose of medication.   Severe b/l hip pain from OA:  She takes tylenol #3 at bedtime.  Medication does help.  She does have pain throughout the day and it limits how much activity she can do.  She has been trying to do some walking because she knows it is important to stay active, but tries not to go too far because she is afraid she will not be able to get home.  Difficulty sleeping; she is taking trazodone.  She sleeps approximately 5 hours a night, but this is good quality sleep.  She feels overall her sleep is good.  She has had 2 UTIs this year and had one last December.   When she was here last she was told it could be from the [place].  She wanted to see if that was true.  Her sugars have been well controlled and she is concerned about making any changes.  She wonders if changing something is necessary at this time.  When she has gotten UTIs she typically has hematuria.  She does not have any symptoms of UTI now.   Review of Systems  Constitutional: Negative for chills and fever.  Respiratory: Negative for cough, shortness of breath and wheezing.   Cardiovascular: Negative for chest pain, palpitations and leg swelling.  Neurological: Positive for headaches (occasional, not new). Negative for dizziness.     Social History   Socioeconomic History  . Marital status: Widowed    Spouse name: Not on file  . Number of children: 3  . Years of education: college  . Highest education level: Not on file  Occupational History  . Occupation: Retired  Scientific laboratory technician  . Financial resource strain: Not hard at all  . Food insecurity:    Worry: Never true    Inability: Never true  . Transportation needs:    Medical: No    Non-medical: No  Tobacco Use  . Smoking status: Never Smoker  . Smokeless tobacco: Never Used  Substance and Sexual Activity  . Alcohol use: No    Alcohol/week:  0.0 standard drinks  . Drug use: No  . Sexual activity: Not Currently  Lifestyle  . Physical activity:    Days per week: 0 days    Minutes per session: 0 min  . Stress: To some extent  Relationships  . Social connections:    Talks on phone: More than three times a week    Gets together: More than three times a week    Attends religious service: More than 4 times per year    Active member of club or organization: Yes    Attends meetings of clubs or organizations: More than 4 times per year    Relationship status: Widowed  Other Topics Concern  . Not on file  Social History Narrative   Illicit drug use- no   Patient does not get regular exercise due to knee.   Patient  lives at home alone.    Patient son and wife lives next door.   Patient has 3 children.    Patient has some college.    Patient retired May 2014.      Observations/Objective: Appears well in NAD  Lab Results  Component Value Date   WBC 11.2 (H) 01/09/2018   HGB 15.4 (H) 01/09/2018   HCT 49.3 (H) 01/09/2018   PLT 271 01/09/2018   GLUCOSE 92 01/09/2018   CHOL 144 09/27/2017   TRIG 107.0 09/27/2017   HDL 54.60 09/27/2017   LDLCALC 68 09/27/2017   ALT 8 09/27/2017   AST 6 09/27/2017   NA 140 01/09/2018   K 3.6 01/09/2018   CL 104 01/09/2018   CREATININE 0.63 01/09/2018   BUN 11 01/09/2018   CO2 24 01/09/2018   TSH 2.03 09/27/2017   INR 1.17 11/09/2008   HGBA1C 6.7 (H) 09/27/2017   MICROALBUR 4.2 (H) 11/25/2015     Assessment and Plan:  See Problem List for Assessment and Plan of chronic medical problems.   Follow Up Instructions:    I discussed the assessment and treatment plan with the patient. The patient was provided an opportunity to ask questions and all were answered. The patient agreed with the plan and demonstrated an understanding of the instructions.   The patient was advised to call back or seek an in-person evaluation if the symptoms worsen or if the condition fails to improve as anticipated.    Binnie Rail, MD

## 2018-05-03 ENCOUNTER — Ambulatory Visit (INDEPENDENT_AMBULATORY_CARE_PROVIDER_SITE_OTHER): Payer: Medicare Other | Admitting: Internal Medicine

## 2018-05-03 ENCOUNTER — Encounter: Payer: Self-pay | Admitting: Internal Medicine

## 2018-05-03 DIAGNOSIS — F32A Depression, unspecified: Secondary | ICD-10-CM

## 2018-05-03 DIAGNOSIS — N390 Urinary tract infection, site not specified: Secondary | ICD-10-CM | POA: Diagnosis not present

## 2018-05-03 DIAGNOSIS — I10 Essential (primary) hypertension: Secondary | ICD-10-CM | POA: Diagnosis not present

## 2018-05-03 DIAGNOSIS — E1151 Type 2 diabetes mellitus with diabetic peripheral angiopathy without gangrene: Secondary | ICD-10-CM | POA: Diagnosis not present

## 2018-05-03 DIAGNOSIS — G479 Sleep disorder, unspecified: Secondary | ICD-10-CM | POA: Diagnosis not present

## 2018-05-03 DIAGNOSIS — F329 Major depressive disorder, single episode, unspecified: Secondary | ICD-10-CM | POA: Diagnosis not present

## 2018-05-03 DIAGNOSIS — M160 Bilateral primary osteoarthritis of hip: Secondary | ICD-10-CM | POA: Diagnosis not present

## 2018-05-03 MED ORDER — BLOOD GLUCOSE MONITOR KIT: PACK | 0 refills | Status: AC

## 2018-05-03 NOTE — Assessment & Plan Note (Signed)
BP Readings from Last 3 Encounters:  03/03/18 120/76  01/09/18 137/73  12/30/17 120/76    Blood pressure has been well controlled Continue current medication at current dose

## 2018-05-03 NOTE — Assessment & Plan Note (Signed)
Last A1c well controlled The Jardiance is likely causing urinary tract infections, but she is hesitant to make any changes because it is working so well At this time she would like to continue her current medications-metformin, Jardiance and Trulicity She will continue to be compliant with a diabetic diet Encouraged her to be as active as possible We will consider checking medication over the summer once the coronavirus situation has resolved Follow-up with me in 6 months

## 2018-05-03 NOTE — Assessment & Plan Note (Signed)
Has had 3 UTIs in the past 6 months Likely related to her being on Jardiance Discussed that we can discontinue this and replace it with something different, but because her sugars are so well controlled on her current medication regimen she does not want to make any changes If she does continue to get urinary tract infections she will let me know so we can change her medication Discussed that these can be serious infections that we need to be very careful with treating them quickly and not letting her continue to have recurrent infections

## 2018-05-08 ENCOUNTER — Other Ambulatory Visit: Payer: Self-pay | Admitting: Internal Medicine

## 2018-05-16 ENCOUNTER — Other Ambulatory Visit: Payer: Self-pay | Admitting: Internal Medicine

## 2018-05-19 ENCOUNTER — Other Ambulatory Visit: Payer: Self-pay

## 2018-05-19 MED ORDER — DULAGLUTIDE 1.5 MG/0.5ML ~~LOC~~ SOAJ: SUBCUTANEOUS | 1 refills | Status: DC

## 2018-05-21 ENCOUNTER — Other Ambulatory Visit: Payer: Self-pay | Admitting: Internal Medicine

## 2018-05-30 ENCOUNTER — Other Ambulatory Visit: Payer: Self-pay

## 2018-05-30 DIAGNOSIS — G40309 Generalized idiopathic epilepsy and epileptic syndromes, not intractable, without status epilepticus: Secondary | ICD-10-CM

## 2018-05-30 DIAGNOSIS — G43109 Migraine with aura, not intractable, without status migrainosus: Secondary | ICD-10-CM

## 2018-05-30 DIAGNOSIS — R251 Tremor, unspecified: Secondary | ICD-10-CM

## 2018-05-30 MED ORDER — TOPIRAMATE 25 MG PO TABS: ORAL_TABLET | ORAL | 3 refills | Status: DC

## 2018-05-31 ENCOUNTER — Other Ambulatory Visit: Payer: Self-pay | Admitting: Internal Medicine

## 2018-05-31 NOTE — Telephone Encounter (Signed)
Last OV 05/03/18 Next OV Not scheduled  [place] Controlled Substance Database checked. Tylenol 3 Last filled on 05/01/18

## 2018-06-07 ENCOUNTER — Other Ambulatory Visit: Payer: Self-pay | Admitting: Internal Medicine

## 2018-06-10 ENCOUNTER — Other Ambulatory Visit: Payer: Self-pay | Admitting: Internal Medicine

## 2018-06-30 ENCOUNTER — Other Ambulatory Visit: Payer: Self-pay | Admitting: Internal Medicine

## 2018-06-30 NOTE — Telephone Encounter (Signed)
Last refill 05/31/18 Last OV 05/03/18 Next OV 11/22/18

## 2018-07-18 ENCOUNTER — Other Ambulatory Visit: Payer: Self-pay

## 2018-07-18 DIAGNOSIS — G43109 Migraine with aura, not intractable, without status migrainosus: Secondary | ICD-10-CM

## 2018-07-18 DIAGNOSIS — G40309 Generalized idiopathic epilepsy and epileptic syndromes, not intractable, without status epilepticus: Secondary | ICD-10-CM

## 2018-07-18 MED ORDER — DIVALPROEX SODIUM 500 MG PO DR TAB: DELAYED_RELEASE_TABLET | ORAL | 4 refills | Status: DC

## 2018-07-24 ENCOUNTER — Encounter: Payer: Self-pay | Admitting: Internal Medicine

## 2018-07-27 MED ORDER — METFORMIN HCL 1000 MG PO TABS: 1000.0000 mg | ORAL_TABLET | Freq: Two times a day (BID) | ORAL | 1 refills | Status: DC

## 2018-07-27 MED ORDER — GLIPIZIDE 10 MG PO TABS: 10.0000 mg | ORAL_TABLET | Freq: Two times a day (BID) | ORAL | 3 refills | Status: DC

## 2018-07-27 NOTE — Addendum Note (Signed)
Addended by: Binnie Rail on: 07/27/2018 07:12 PM   Modules accepted: Orders

## 2018-08-01 ENCOUNTER — Other Ambulatory Visit: Payer: Self-pay | Admitting: Internal Medicine

## 2018-08-01 NOTE — Telephone Encounter (Signed)
Control database checked last refill: 06/30/2018 60tabs  LOV:05/03/2018 NOV: none

## 2018-08-25 ENCOUNTER — Other Ambulatory Visit: Payer: Self-pay | Admitting: Internal Medicine

## 2018-08-30 ENCOUNTER — Encounter: Payer: Self-pay | Admitting: Neurology

## 2018-08-30 ENCOUNTER — Telehealth (INDEPENDENT_AMBULATORY_CARE_PROVIDER_SITE_OTHER): Payer: Medicare Other | Admitting: Neurology

## 2018-08-30 ENCOUNTER — Other Ambulatory Visit: Payer: Self-pay

## 2018-08-30 VITALS — Ht 63.0 in | Wt 262.0 lb

## 2018-08-30 DIAGNOSIS — G40309 Generalized idiopathic epilepsy and epileptic syndromes, not intractable, without status epilepticus: Secondary | ICD-10-CM

## 2018-08-30 DIAGNOSIS — G629 Polyneuropathy, unspecified: Secondary | ICD-10-CM

## 2018-08-30 DIAGNOSIS — R251 Tremor, unspecified: Secondary | ICD-10-CM

## 2018-08-30 DIAGNOSIS — R2681 Unsteadiness on feet: Secondary | ICD-10-CM

## 2018-08-30 DIAGNOSIS — G43109 Migraine with aura, not intractable, without status migrainosus: Secondary | ICD-10-CM

## 2018-08-30 DIAGNOSIS — R296 Repeated falls: Secondary | ICD-10-CM

## 2018-08-30 MED ORDER — DIVALPROEX SODIUM 500 MG PO DR TAB: DELAYED_RELEASE_TABLET | ORAL | 3 refills | Status: DC

## 2018-08-30 MED ORDER — TOPIRAMATE 100 MG PO TABS: 100.0000 mg | ORAL_TABLET | Freq: Two times a day (BID) | ORAL | 3 refills | Status: DC

## 2018-08-30 NOTE — Progress Notes (Signed)
Virtual Visit via Video Note The purpose of this virtual visit is to provide medical care while limiting exposure to the novel coronavirus.    Consent was obtained for video visit:  Yes.   Answered questions that patient had about telehealth interaction:  Yes.   I discussed the limitations, risks, security and privacy concerns of performing an evaluation and management service by telemedicine. I also discussed with the patient that there may be a patient responsible charge related to this service. The patient expressed understanding and agreed to proceed.  Pt location: Home Physician Location: office Name of referring provider:  Binnie Rail, MD I connected with Alexis Grant at patients initiation/request on 08/30/2018 at  8:30 AM EDT by video enabled telemedicine application and verified that I am speaking with the correct person using two identifiers. Pt MRN:  [id number] Pt DOB:  [birth year] Video Participants:  Alexis Grant   History of Present Illness:  The patient was seen as a virtual video visit on 08/30/2018. She was last seen a year ago for seizures, migraines, and frequent falls. She had an EMG/NCV of both lower extremities in 07/2017 which showed a predominant sensory polyneuropathy affecting both legs. She reports balance therapy was cost-prohibitive. She has had at least 3 falls in the past year, the worst was last month when she lost her balance and fell, bruising her left hip. No loss of consciousness. On her last visit, she was reporting more tremors, Depakote dose reduced to 569m BID and Topiramate increased to 214min AM, 5054mn PM. She reports this has worked pretty well, she still shakes some but it does not seem as bad and is not constant. It sometimes affects using a fork, writing, or carrying a drink. She used to have beautiful handwriting but it is now jerky. No side effects on medications. She continues to report frequent headaches around 3 times a week, no  nausea/vomiting. Excedrin migraine helps to ease it. She only gets 3-4 hours of sleep due to various issues, including pain and urinary frequency. She has so much hip pain and sleeps in her recliner, then uses the bathroom 4-5 times at night. She wears a CPAP at night. She has numbness and tingling in both feet.   History on Initial Assessment 11/28/2014: This is a pleasant [age] RH woman with a history of hypertension, hyperlipidemia, diabetes, obesity, sleep apnea, CAD s/p MI, with seizures and migraines. She reports seizures started at age 73,60he woke up in the hospital with no prior warning symptoms. Since then, she has had 4 generalized tonic-clonic seizures, last was in 2002. She also report 5 or 6 "little episodes" where she feels dizzy, with blurred vision, then if she sits down quickly and tries to relax, she can avoid any progression. She would feel briefly confused. No associated olfactory/gustatory hallucinations, focal numbness/tingling/weakness, myoclonic jerks. The last time she had these episodes was at least 12 years ago. She recalls taking Neurontin initially, which did not help. Topamax in the past which caused GI symptoms. She has been taking Depakote ER 500m39m AM, 1000mg22mPM for many years now with no side effects. She reports having brain imaging and EEG in the past which were normal, no records available for review.   She reports migraines started at age 44. S83 has an aura of numbness in her face, cheeks, and temple, then she becomes very sensitive to lights, sounds, and smells. She sees flashing lights then her vision becomes  blurred, followed by throbbing headaches in the frontal or occipital regions with associated nausea. No associated focal numbness/tingling/weakness in the extremities. She tried Imitrex with minimal effect. A course of Prednisone   She has chronic neck pain and has been dealing with diarrhea from IBS. She had an MI in 2010.   Epilepsy Risk Factors:  There  is a strong family history of seizures in her paternal grandfather, paternal aunt, nephew. Otherwise she had a normal birth and early development.  There is no history of febrile convulsions, CNS infections such as meningitis/encephalitis, significant traumatic brain injury, neurosurgical procedures.    Current Outpatient Medications on File Prior to Visit  Medication Sig Dispense Refill  . acetaminophen-codeine (TYLENOL #3) 300-30 MG tablet TAKE 1 TO 2 TABLETS BY MOUTH EVERY DAY AT BEDTIME FOR CHRONIC HIP PAIN 60 tablet 0  . blood glucose meter kit and supplies KIT Dispense based on patient and insurance preference. Use up to four times daily as directed. (FOR E11.9). 1 each 0  . divalproex (DEPAKOTE) 500 MG DR tablet TAKE 1 TABLET BY MOUTH DAILY IN THE MORNING AND 2 TABS IN THE EVENING (Patient taking differently: TAKE 1 TABLET BY MOUTH DAILY IN THE MORNING AND 1 TAB IN THE EVENING) 270 tablet 4  . glipiZIDE (GLUCOTROL) 10 MG tablet Take 1 tablet (10 mg total) by mouth 2 (two) times daily before a meal. 60 tablet 3  . levothyroxine (SYNTHROID) 75 MCG tablet Take 1 tablet (75 mcg total) by mouth daily before breakfast. Annual appt due in Sept must see provider for future refills 30 tablet 0  . losartan (COZAAR) 50 MG tablet Take 1 tablet by mouth once daily 90 tablet 0  . metFORMIN (GLUCOPHAGE) 1000 MG tablet Take 1 tablet (1,000 mg total) by mouth 2 (two) times daily with a meal. 360 tablet 1  . omeprazole (PRILOSEC) 40 MG capsule Take 1 capsule by mouth once daily 90 capsule 1  . ondansetron (ZOFRAN ODT) 4 MG disintegrating tablet Take 1 tablet (4 mg total) by mouth every 8 (eight) hours as needed for nausea or vomiting. 20 tablet 0  . sertraline (ZOLOFT) 100 MG tablet Take 1 tablet by mouth once daily 30 tablet 2  . simvastatin (ZOCOR) 20 MG tablet Take 1 tablet (20 mg total) by mouth at bedtime. 90 tablet 1  . topiramate (TOPAMAX) 25 MG tablet Take 1 tablet in AM, 2 tablets in PM 370 tablet 3   . traZODone (DESYREL) 50 MG tablet TAKE 2 TABLETS BY MOUTH AT BEDTIME FOR SLEEP 180 tablet 0   No current facility-administered medications on file prior to visit.      Observations/Objective:   Vitals:   08/30/18 0823  Weight: 262 lb (118.8 kg)  Height: '5\' 3"'  (1.6 m)   GEN:  The patient appears stated age and is in NAD.  Neurological examination: Patient is awake, alert, oriented x 3. No aphasia or dysarthria. Intact fluency and comprehension. Remote and recent memory intact. Able to name and repeat. Cranial nerves: Extraocular movements intact with no nystagmus. No facial asymmetry. Motor: moves all extremities symmetrically, at least anti-gravity x 4. No incoordination on finger to nose testing. Gait: slow and cautious, wide-based, no ataxia. There is no clear tremor on video, however she keeps turning the camera off when trying to touch it saying her shaking hands keep touching the wrong thing.   Assessment and Plan:   This is a pleasant 68 yo RH woman with a history of hypertension, hyperlipidemia,  obesity, sleep apnea on CPAP, CAD s/p MI, with a history of convulsive seizures, well-controlled on Depakote with no seizures since 2002. She has had migraines since her teenage years. There has been improvement in tremors with reduction in Depakote, continue 560m BID. She is reporting frequent headaches, increase Topiramate to 565mBID for 2 weeks, then 10043mID. She continues to have balance issues with falls, EMG showed neuropathy, home balance therapy will be ordered. She will follow-up in 6 months and knows to call for any changes.    Follow Up Instructions:   -I discussed the assessment and treatment plan with the patient. The patient was provided an opportunity to ask questions and all were answered. The patient agreed with the plan and demonstrated an understanding of the instructions.   The patient was advised to call back or seek an in-person evaluation if the symptoms worsen or  if the condition fails to improve as anticipated.     KarCameron SprangD

## 2018-08-31 ENCOUNTER — Other Ambulatory Visit: Payer: Self-pay | Admitting: Internal Medicine

## 2018-08-31 NOTE — Telephone Encounter (Signed)
Check [place] registry last filled Tylenol w/cod  08/01/2018.Marland KitchenJohny Grant

## 2018-09-09 ENCOUNTER — Other Ambulatory Visit: Payer: Self-pay | Admitting: Internal Medicine

## 2018-09-11 DIAGNOSIS — R2689 Other abnormalities of gait and mobility: Secondary | ICD-10-CM | POA: Diagnosis not present

## 2018-09-11 DIAGNOSIS — R2681 Unsteadiness on feet: Secondary | ICD-10-CM | POA: Diagnosis not present

## 2018-09-11 DIAGNOSIS — M6281 Muscle weakness (generalized): Secondary | ICD-10-CM | POA: Diagnosis not present

## 2018-09-11 DIAGNOSIS — M545 Low back pain: Secondary | ICD-10-CM | POA: Diagnosis not present

## 2018-09-16 ENCOUNTER — Other Ambulatory Visit: Payer: Self-pay | Admitting: Internal Medicine

## 2018-09-18 DIAGNOSIS — M6281 Muscle weakness (generalized): Secondary | ICD-10-CM | POA: Diagnosis not present

## 2018-09-18 DIAGNOSIS — M545 Low back pain: Secondary | ICD-10-CM | POA: Diagnosis not present

## 2018-09-18 DIAGNOSIS — R2681 Unsteadiness on feet: Secondary | ICD-10-CM | POA: Diagnosis not present

## 2018-09-18 DIAGNOSIS — R2689 Other abnormalities of gait and mobility: Secondary | ICD-10-CM | POA: Diagnosis not present

## 2018-09-20 DIAGNOSIS — M6281 Muscle weakness (generalized): Secondary | ICD-10-CM | POA: Diagnosis not present

## 2018-09-20 DIAGNOSIS — R2681 Unsteadiness on feet: Secondary | ICD-10-CM | POA: Diagnosis not present

## 2018-09-20 DIAGNOSIS — R2689 Other abnormalities of gait and mobility: Secondary | ICD-10-CM | POA: Diagnosis not present

## 2018-09-20 DIAGNOSIS — M545 Low back pain: Secondary | ICD-10-CM | POA: Diagnosis not present

## 2018-09-26 ENCOUNTER — Telehealth: Payer: Self-pay | Admitting: Neurology

## 2018-09-26 NOTE — Telephone Encounter (Signed)
[place] from [facility] called to check on the status of the Medicare POC form that needs faxed back to their office for DOS 09/11/2018. She said the form has been faxed several times.  Fax: [phone]

## 2018-09-27 NOTE — Telephone Encounter (Signed)
POC faxed back to number given.  Dr. [name] has been out of the office since last Wed.

## 2018-09-30 ENCOUNTER — Other Ambulatory Visit: Payer: Self-pay | Admitting: Internal Medicine

## 2018-10-02 NOTE — Telephone Encounter (Signed)
Last OV 05/03/18 Next OV NA Last RF 08/31/18

## 2018-10-02 NOTE — Telephone Encounter (Signed)
Pt states that she did not sleep at all last night, requesting refill as soon as possible. Please advise

## 2018-10-04 ENCOUNTER — Other Ambulatory Visit: Payer: Self-pay | Admitting: Internal Medicine

## 2018-10-17 ENCOUNTER — Other Ambulatory Visit: Payer: Self-pay

## 2018-10-17 MED ORDER — GLIPIZIDE 10 MG PO TABS: 10.0000 mg | ORAL_TABLET | Freq: Two times a day (BID) | ORAL | 0 refills | Status: DC

## 2018-10-29 ENCOUNTER — Other Ambulatory Visit: Payer: Self-pay | Admitting: Internal Medicine

## 2018-11-01 ENCOUNTER — Other Ambulatory Visit: Payer: Self-pay | Admitting: Internal Medicine

## 2018-11-01 NOTE — Telephone Encounter (Signed)
Last OV 05/03/18 Next OV 11/22/18 Last RF 10/03/18

## 2018-11-21 ENCOUNTER — Telehealth: Payer: Self-pay

## 2018-11-21 NOTE — Progress Notes (Addendum)
Subjective:   Alexis Grant is a 68 y.o. female who presents for Medicare Annual (Subsequent) preventive examination. I connected with patient by a telephone and verified that I am speaking with the correct person using two identifiers. Patient stated full name and DOB. Patient gave permission to continue with telephonic visit. Patient's location was at home and Nurse's location was at [place] office. Participants during this visit included patient and nurse.  Review of Systems:   Cardiac Risk Factors include: advanced age (>48mn, >>11women);diabetes mellitus;dyslipidemia;hypertension Sleep patterns: has interrupted sleep, gets up 2-3 times nightly to void and sleeps 4-5 hours nightly. Patient reports insomnia issues mainly due to pain and frequent bathroom trips, discussed recommended sleep tips.   Home Safety/Smoke Alarms: Feels safe in home. Smoke alarms in place.  Living environment; residence and Firearm Safety: 1-story house/ trailer, equipment: CRadio producer Type: [place] Walkers, Type: RConservation officer, nature Lives alone, no needs for DME, good support system Seat Belt Safety/Bike Helmet: Wears seat belt.    Objective:     Vitals: There were no vitals taken for this visit.  There is no height or weight on file to calculate BMI.  Advanced Directives 11/22/2018 01/09/2018 11/16/2017 08/11/2017 09/21/2016 09/08/2016 07/29/2016  Does Patient Have a Medical Advance Directive? _0  No No  Copy of Healthcare Power of Attorney in Chart? - - - - No - copy requested No - copy requested -  Would patient like information on creating a medical advance directive? No - Patient declined No - Patient declined Yes (ED - Information included in AVS) No - Patient declined - - -    Tobacco Social History   Tobacco Use  Smoking Status Never Smoker  Smokeless Tobacco Never Used     Counseling given: Not Answered  Past Medical History:  Diagnosis Date  . Adenomatous polyp of colon 2008   . Allergy   . Anemia   . Anxiety   . Arthritis   . Blood transfusion without reported diagnosis    following knee surgery   . Cataract    hx- removed both eyes  . Depression   . Diabetes mellitus    type II   . Dyslipidemia   . Fibromyalgia   . Gallstones   . Gastritis   . GERD (gastroesophageal reflux disease)   . Hemangioma   . Hiatal hernia    Fibromyalgia  . Hyperlipidemia   . Hypertension   . Hypothyroidism 2010   Dr.[place]r  . IBS (irritable bowel syndrome)    Dr SFuller Plan . Migraines   . [place]   subendocardrial, following R TKR  . Obesity   . OSA (obstructive sleep apnea)    cpap - does not know settings   . Pneumonia   . [place]ure disorder ([place]   [place]    last seizure was 2002 per pt 07-29-16  . Sleep apnea   . Urinary tract infection    Past Surgical History:  Procedure Laterality Date  . ABDOMINAL HYSTERECTOMY  1988  . CARDIAC CATHETERIZATION  2007   DrGamble ( now seeing Dr HTonita Congcardiology)  . CHOLECYSTECTOMY  1985  . COLONOSCOPY    . COLONOSCOPY WITH PROPOFOL N/A 07/16/2014   Procedure: COLONOSCOPY WITH PROPOFOL;  Surgeon: MLadene Artist MD;  Location: [facility];  Service: Endoscopy;  Laterality: N/A;  . COLONOSCOPY WITH PROPOFOL N/A 09/21/2016   Procedure: COLONOSCOPY WITH PROPOFOL;  Surgeon: SLadene Artist MD;  Location: [facility];  Service: Endoscopy;  Laterality: N/A;  . POLYPECTOMY  2012   6 or 7 adenomatous, Dr.Stark  . steroid injection to left si joint  12/2008   Dr.Newton   . TOTAL KNEE ARTHROPLASTY Right 11/2005  . TOTAL KNEE ARTHROPLASTY Left   . TUBAL LIGATION  1986   Family History  Problem Relation Age of Onset  . Colon polyps Mother   . Ulcers Father   . Colon polyps Brother   . Diabetes Sister   . Kidney failure Sister   . Diabetes Paternal Aunt   . Esophageal cancer Brother   . Kidney cancer Sister   . Renal cancer Sister   . Colon cancer Neg Hx   . Heart attack Neg Hx   .  Stroke Neg Hx   . Cancer Neg Hx   . Gallbladder disease Neg Hx   . Rectal cancer Neg Hx   . Stomach cancer Neg Hx    Social History   Socioeconomic History  . Marital status: Widowed    Spouse name: Not on file  . Number of children: 3  . Years of education: college  . Highest education level: Not on file  Occupational History  . Occupation: Retired  Scientific laboratory technician  . Financial resource strain: Not hard at all  . Food insecurity    Worry: Never true    Inability: Never true  . Transportation needs    Medical: No    Non-medical: No  Tobacco Use  . Smoking status: Never Smoker  . Smokeless tobacco: Never Used  Substance and Sexual Activity  . Alcohol use: No    Alcohol/week: 0.0 standard drinks  . Drug use: No  . Sexual activity: Not Currently  Lifestyle  . Physical activity    Days per week: 5 days    Minutes per session: 20 min  . Stress: To some extent  Relationships  . Social connections    Talks on phone: More than three times a week    Gets together: More than three times a week    Attends religious service: More than 4 times per year    Active member of club or organization: Yes    Attends meetings of clubs or organizations: More than 4 times per year    Relationship status: Widowed  Other Topics Concern  . Not on file  Social History Narrative   Illicit drug use- no   Patient does not get regular exercise due to knee.   Patient lives at home alone.    Patient son and wife lives next door.   Patient has 3 children.    Patient has some college.    Patient retired May 2014.     Outpatient Encounter Medications as of 11/22/2018  Medication Sig  . acetaminophen (TYLENOL) 500 MG tablet Take 500 mg by mouth every 6 (six) hours as needed.  [name] acetaminophen-codeine (TYLENOL #3) 300-30 MG tablet TAKE 1 TO 2 TABLETS BY MOUTH ONCE DAILY AT BEDTIME FOR  CHRONIC  HIP  PAIN  . blood glucose meter kit and supplies KIT Dispense based on patient and insurance preference.  Use up to four times daily as directed. (FOR E11.9).  [name] divalproex (DEPAKOTE) 500 MG DR tablet Take 1 tab twice a day  . glipiZIDE (GLUCOTROL) 10 MG tablet Take 1 tablet (10 mg total) by mouth 2 (two) times daily before a meal.  . levothyroxine (EUTHYROX) 75 MCG tablet Take 1 tablet by mouth once daily before  breakfast.  . losartan (COZAAR) 50 MG tablet Take 1 tablet by mouth once daily  . metFORMIN (GLUCOPHAGE) 1000 MG tablet Take 1 tablet (1,000 mg total) by mouth 2 (two) times daily with a meal.  . omeprazole (PRILOSEC) 40 MG capsule Take 1 capsule by mouth once daily  . ondansetron (ZOFRAN ODT) 4 MG disintegrating tablet Take 1 tablet (4 mg total) by mouth every 8 (eight) hours as needed for nausea or vomiting.  . sertraline (ZOLOFT) 100 MG tablet Take 1 tablet by mouth once daily  . simvastatin (ZOCOR) 20 MG tablet Take 1 tablet (20 mg total) by mouth at bedtime. Need office visit for more refills  . topiramate (TOPAMAX) 100 MG tablet Take 1 tablet (100 mg total) by mouth 2 (two) times daily.  . traZODone (DESYREL) 50 MG tablet TAKE 2 TABLETS BY MOUTH AT BEDTIME FOR SLEEP   No facility-administered encounter medications on file as of 11/22/2018.     Activities of Daily Living In your present state of health, do you have any difficulty performing the following activities: 11/22/2018  Hearing? N  Vision? N  Difficulty concentrating or making decisions? N  Walking or climbing stairs? N  Dressing or bathing? N  Doing errands, shopping? N  Preparing Food and eating ? N  Using the Toilet? N  In the past six months, have you accidently leaked urine? Y  Do you have problems with loss of bowel control? N  Managing your Medications? N  Managing your Finances? N  Housekeeping or managing your Housekeeping? N  Some recent data might be hidden    Patient Care Team: Binnie Rail, MD as PCP - General (Internal Medicine) [name] Lezlie Octave, MD as Consulting Physician (Neurology) Garald Balding, MD as Consulting Physician (Orthopedic Surgery) Chesley Mires, MD as Consulting Physician (Pulmonary Disease) Monna Fam, MD as Consulting Physician (Ophthalmology)    Assessment:   This is a routine wellness examination for [place]. Physical assessment deferred to PCP.  Exercise Activities and Dietary recommendations Current Exercise Habits: Home exercise routine, Time (Minutes): 25, Frequency (Times/Week): 6, Weekly Exercise (Minutes/Week): 150, Intensity: Mild, Exercise limited by: orthopedic condition(s)  Diet (meal preparation, eat out, water intake, caffeinated beverages, dairy products, fruits and vegetables): in general, a "healthy" diet  , on average, 1-2 meals per day. Reports that she snacks more than eats meals.   Reviewed heart healthy and diabetic diet. Encouraged patient to increase daily water and healthy fluid intake.  Discussed supplementing with Ensure  and coupons were provided.  Goals    . Patient Stated     Increase my social activity by perhaps looking into the senior center.        Fall Risk Fall Risk  11/22/2018 08/30/2018 11/16/2017 07/05/2017 04/08/2017  Falls in the past year? 1 1 Yes Yes Yes  Number falls in past yr: 1 0 2 or more 2 or more 2 or more  Injury with Fall? 1 1 Yes No Yes  Comment - Bruised left hip - - -  Risk Factor Category  - - High Fall Risk High Fall Risk High Fall Risk  Risk for fall due to : Impaired balance/gait;Impaired mobility;History of fall(s) Impaired balance/gait Impaired balance/gait;Impaired mobility - -  Follow up Falls prevention discussed Falls evaluation completed Education provided;Falls prevention discussed Falls evaluation completed -   Is the patient's home free of loose throw rugs in walkways, pet beds, electrical cords, etc?   yes      Grab bars  in the bathroom? yes      Handrails on the stairs?   yes      Adequate lighting?   yes  Depression Screen PHQ 2/9 Scores 11/22/2018 11/16/2017 11/15/2016  PHQ - 2  Score 1 3 0  PHQ- 9 Score 4 9 -     Cognitive Function       Ad8 score reviewed for issues:  Issues making decisions: no  Less interest in hobbies / activities: no  Repeats questions, stories (family complaining): no  Trouble using ordinary gadgets (microwave, computer, phone):no  Forgets the month or year: no  Mismanaging finances: no  Remembering appts: no  Daily problems with thinking and/or memory: no Ad8 score is= 0  Immunization History  Administered Date(s) Administered  . Influenza Split 10/26/2011  . Influenza, High Dose Seasonal PF 11/15/2016, 09/27/2017  . Influenza,inj,Quad PF,6+ Mos 10/25/2012, 11/25/2015  . Influenza-Unspecified 10/19/2014  . Pneumococcal Conjugate-13 05/24/2016  . Pneumococcal Polysaccharide-23 06/27/2017  . Td 10/04/2008   Screening Tests Health Maintenance  Topic Date Due  . HEMOGLOBIN A1C  03/28/2018  . OPHTHALMOLOGY EXAM  03/29/2018  . INFLUENZA VACCINE  08/19/2018  . TETANUS/TDAP  10/05/2018  . FOOT EXAM  11/17/2018  . DEXA SCAN  11/18/2019  . MAMMOGRAM  11/30/2019  . COLONOSCOPY  09/22/2026  . Hepatitis C Screening  Completed  . PNA vac Low Risk Adult  Completed      Plan:    Scheduled a virtual visit with PCP on 11/23/18 to discuss pain control and overactive bladder.   Reviewed health maintenance screenings with patient today and relevant education, vaccines, and/or referrals were provided.   Continue to eat heart healthy diet (full of fruits, vegetables, whole grains, lean protein, water--limit salt, fat, and sugar intake) and increase physical activity as tolerated.  Continue doing brain stimulating activities (puzzles, reading, adult coloring books, staying active) to keep memory sharp.   I have personally reviewed and noted the following in the patient's chart:   . Medical and social history . Use of alcohol, tobacco or illicit drugs  . Current medications and supplements . Functional ability and status .  Nutritional status . Physical activity . Advanced directives . List of other physicians . Screenings to include cognitive, depression, and falls . Referrals and appointments  In addition, I have reviewed and discussed with patient certain preventive protocols, quality metrics, and best practice recommendations. A written personalized care plan for preventive services as well as general preventive health recommendations were provided to patient.     Michiel Cowboy, RN  11/22/2018   Medical screening examination/treatment/procedure(s) were performed by non-physician practitioner and as supervising physician I was immediately available for consultation/collaboration. I agree with above. Binnie Rail, MD

## 2018-11-21 NOTE — Telephone Encounter (Signed)
Spoke with patient, She is going to call later to set up an appointment.

## 2018-11-21 NOTE — Telephone Encounter (Signed)
She is due for a follow up and needs an appointment for labs.

## 2018-11-21 NOTE — Telephone Encounter (Signed)
Copied from [place] [id number] [id number]. Topic: General - Other >> Nov 21, 2018  9:57 AM Antonieta Iba C wrote: Reason for CRM: pt says that it is time for her to have labs completed. Pt says that she is feeling a little under the weather at the moment but she would like to have then placed for when she is ready.

## 2018-11-22 ENCOUNTER — Ambulatory Visit (INDEPENDENT_AMBULATORY_CARE_PROVIDER_SITE_OTHER): Payer: Medicare Other | Admitting: *Deleted

## 2018-11-22 DIAGNOSIS — Z0000 Encounter for general adult medical examination without abnormal findings: Secondary | ICD-10-CM

## 2018-11-22 NOTE — Progress Notes (Signed)
Virtual Visit via Video Note  I connected with Alexis Grant on 11/23/18 at 10:00 AM EST by a video enabled telemedicine application and verified that I am speaking with the correct person using two identifiers.   I discussed the limitations of evaluation and management by telemedicine and the availability of in person appointments. The patient expressed understanding and agreed to proceed.  Present on the call:  Myself, Dr Billey Gosling and the patient Alexis Grant.  The patient is currently at home and I am in the office.    No referring provider.    History of Present Illness: She is here for follow up of her chronic medical conditions.   She is exercising minimally.  She is did PT for her balance, which helped.  She is still doing some of the stretching exercises.   Her whole body hurts.  She has OA all over and everything hurts worse.  Her hands, ankles, knees, hips.    Chronic b/l hip pain from OA:  She takes 2 tylenol #3 at bedtime.  This has helped her severe hip arthritis.  The only treatment option her orthopedics is surgery and she does not want to have surgery because of complications she had with previous knee surgery.  She wants to continue the medication and it does help, but she has pain during the day and wondered if she could take some during the day.  Diabetes: She is taking her medication daily as prescribed. She is compliant with a diabetic diet.  She monitors her sugars and they have been running rare 50, usually 100-110.   Hypertension: She is taking her medication daily. She is compliant with a low sodium diet.  She denies chest pain, palpitations, shortness of breath.   Hyperlipidemia: She is taking her medication daily. She is compliant with a low fat/cholesterol diet. She denies myalgias.   Hypothyroidism:  She is taking her medication daily.  She denies any recent changes in energy or weight that are unexplained.   Depression: She is taking her medication  daily as prescribed. She denies any side effects from the medication. She feels her depression is well controlled and she is happy with her current dose of medication.   Insomnia:  She is taking trazodone.  She gets about 5 hrs of sleep.    Overactive bladder:  She has to go frequently and can not make it to the bathroom.  It has been going on for a while, but has gotten worse.    Review of Systems  Constitutional: Negative for chills and fever.  Respiratory: Negative for cough, shortness of breath and wheezing.   Cardiovascular: Positive for leg swelling. Negative for chest pain and palpitations.  Neurological: Positive for headaches (chronic). Negative for dizziness.     Social History   Socioeconomic History  . Marital status: Widowed    Spouse name: Not on file  . Number of children: 3  . Years of education: college  . Highest education level: Not on file  Occupational History  . Occupation: Retired  Scientific laboratory technician  . Financial resource strain: Not hard at all  . Food insecurity    Worry: Never true    Inability: Never true  . Transportation needs    Medical: No    Non-medical: No  Tobacco Use  . Smoking status: Never Smoker  . Smokeless tobacco: Never Used  Substance and Sexual Activity  . Alcohol use: No    Alcohol/week: 0.0 standard drinks  . Drug  use: No  . Sexual activity: Not Currently  Lifestyle  . Physical activity    Days per week: 5 days    Minutes per session: 20 min  . Stress: To some extent  Relationships  . Social connections    Talks on phone: More than three times a week    Gets together: More than three times a week    Attends religious service: More than 4 times per year    Active member of club or organization: Yes    Attends meetings of clubs or organizations: More than 4 times per year    Relationship status: Widowed  Other Topics Concern  . Not on file  Social History Narrative   Illicit drug use- no   Patient does not get regular  exercise due to knee.   Patient lives at home alone.    Patient son and wife lives next door.   Patient has 3 children.    Patient has some college.    Patient retired May 2014.      Observations/Objective: Appears well in NAD   Assessment and Plan:  See Problem List for Assessment and Plan of chronic medical problems.   Follow Up Instructions:    I discussed the assessment and treatment plan with the patient. The patient was provided an opportunity to ask questions and all were answered. The patient agreed with the plan and demonstrated an understanding of the instructions.   The patient was advised to call back or seek an in-person evaluation if the symptoms worsen or if the condition fails to improve as anticipated.    FU in 3 months  Binnie Rail, MD

## 2018-11-23 ENCOUNTER — Encounter: Payer: Self-pay | Admitting: Internal Medicine

## 2018-11-23 ENCOUNTER — Ambulatory Visit (INDEPENDENT_AMBULATORY_CARE_PROVIDER_SITE_OTHER): Payer: Medicare Other | Admitting: Internal Medicine

## 2018-11-23 DIAGNOSIS — F3289 Other specified depressive episodes: Secondary | ICD-10-CM

## 2018-11-23 DIAGNOSIS — E1151 Type 2 diabetes mellitus with diabetic peripheral angiopathy without gangrene: Secondary | ICD-10-CM

## 2018-11-23 DIAGNOSIS — N3281 Overactive bladder: Secondary | ICD-10-CM

## 2018-11-23 DIAGNOSIS — G479 Sleep disorder, unspecified: Secondary | ICD-10-CM

## 2018-11-23 DIAGNOSIS — M160 Bilateral primary osteoarthritis of hip: Secondary | ICD-10-CM | POA: Diagnosis not present

## 2018-11-23 DIAGNOSIS — I10 Essential (primary) hypertension: Secondary | ICD-10-CM | POA: Diagnosis not present

## 2018-11-23 DIAGNOSIS — E039 Hypothyroidism, unspecified: Secondary | ICD-10-CM | POA: Diagnosis not present

## 2018-11-23 DIAGNOSIS — E785 Hyperlipidemia, unspecified: Secondary | ICD-10-CM

## 2018-11-23 MED ORDER — ACETAMINOPHEN-CODEINE #3 300-30 MG PO TABS: 1.0000 | ORAL_TABLET | Freq: Two times a day (BID) | ORAL | 0 refills | Status: DC | PRN

## 2018-11-23 MED ORDER — OXYBUTYNIN CHLORIDE ER 10 MG PO TB24: 10.0000 mg | ORAL_TABLET | Freq: Every day | ORAL | 5 refills | Status: DC

## 2018-11-23 NOTE — Assessment & Plan Note (Signed)
BP Readings from Last 3 Encounters:  03/03/18 120/76  01/09/18 137/73  12/30/17 120/76   Blood pressure typically controlled Continue current medication CMP

## 2018-11-23 NOTE — Assessment & Plan Note (Signed)
Severe bilateral hip pain Has seen orthopedics and the only treatment is surgery, which she does not want to have Currently taking Tylenol 3-2 tabs at bedtime, which does help She is having a lot of pain during the day and would like to ideally increase medication Increase Tylenol 3 to 2 tabs twice daily as needed Follow-up in 3 months [place] controlled substance database checked and refill sent to pharmacy

## 2018-11-23 NOTE — Assessment & Plan Note (Signed)
Sleep controlled Continue current dose of trazodone

## 2018-11-23 NOTE — Assessment & Plan Note (Signed)
Has an overactive bladder with some stress and urge incontinence Will try oxybutynin 10 mg daily Can try a couple of different medications and if they do not work she would then agree to see neurology

## 2018-11-23 NOTE — Assessment & Plan Note (Signed)
Depression controlled, stable Continue current dose of sertraline

## 2018-11-23 NOTE — Assessment & Plan Note (Signed)
Sugars not well controlled at home-has occasional low sugar which she will monitor closely Advised that if she has more low sugars we may need to decrease her glipizide Encouraged regular exercise Check A1c Continue current medications

## 2018-11-23 NOTE — Assessment & Plan Note (Signed)
Clinically euthyroid Check tsh  Titrate med dose if needed  

## 2018-11-23 NOTE — Assessment & Plan Note (Signed)
Check lipid panel, CMP Continue simvastatin 

## 2018-11-27 ENCOUNTER — Other Ambulatory Visit: Payer: Self-pay

## 2018-11-28 ENCOUNTER — Other Ambulatory Visit: Payer: Self-pay

## 2018-11-28 ENCOUNTER — Other Ambulatory Visit: Payer: Self-pay | Admitting: Internal Medicine

## 2018-11-28 MED ORDER — SIMVASTATIN 20 MG PO TABS: 20.0000 mg | ORAL_TABLET | Freq: Every day | ORAL | 0 refills | Status: DC

## 2018-11-28 MED ORDER — ACETAMINOPHEN-CODEINE #3 300-30 MG PO TABS: 1.0000 | ORAL_TABLET | Freq: Two times a day (BID) | ORAL | 0 refills | Status: DC | PRN

## 2018-11-30 ENCOUNTER — Other Ambulatory Visit: Payer: Self-pay | Admitting: Internal Medicine

## 2018-12-05 ENCOUNTER — Other Ambulatory Visit: Payer: Self-pay | Admitting: Internal Medicine

## 2018-12-05 MED ORDER — TRAZODONE HCL 50 MG PO TABS: ORAL_TABLET | ORAL | 0 refills | Status: DC

## 2018-12-05 MED ORDER — METFORMIN HCL 1000 MG PO TABS: 1000.0000 mg | ORAL_TABLET | Freq: Two times a day (BID) | ORAL | 1 refills | Status: DC

## 2018-12-05 NOTE — Telephone Encounter (Signed)
Copied from [place] [id number]. Topic: Quick Communication - Rx Refill/Question >> Dec 05, 2018  8:55 AM Leward Quan A wrote: Medication: traZODone (DESYREL) 50 MG tablet,  metFORMIN (GLUCOPHAGE) 1000 MG tablet   Has the patient contacted their pharmacy? Yes.   (Agent: If no, request that the patient contact the pharmacy for the refill.) (Agent: If yes, when and what did the pharmacy advise?)  Preferred Pharmacy (with phone number or street name): [id number] [address], [place] [phone] (Phone) [phone] (Fax)    Agent: Please be advised that RX refills may take up to 3 business days. We ask that you follow-up with your pharmacy.

## 2018-12-08 ENCOUNTER — Other Ambulatory Visit: Payer: Self-pay | Admitting: Internal Medicine

## 2018-12-08 MED ORDER — LOSARTAN POTASSIUM 50 MG PO TABS: 50.0000 mg | ORAL_TABLET | Freq: Every day | ORAL | 0 refills | Status: DC

## 2018-12-08 MED ORDER — GLIPIZIDE 10 MG PO TABS: 10.0000 mg | ORAL_TABLET | Freq: Two times a day (BID) | ORAL | 0 refills | Status: DC

## 2018-12-20 ENCOUNTER — Other Ambulatory Visit: Payer: Self-pay | Admitting: Internal Medicine

## 2018-12-20 MED ORDER — SERTRALINE HCL 100 MG PO TABS: 100.0000 mg | ORAL_TABLET | Freq: Every day | ORAL | 0 refills | Status: DC

## 2018-12-21 ENCOUNTER — Encounter: Payer: Self-pay | Admitting: Internal Medicine

## 2018-12-22 ENCOUNTER — Ambulatory Visit (INDEPENDENT_AMBULATORY_CARE_PROVIDER_SITE_OTHER): Payer: Medicare Other | Admitting: *Deleted

## 2018-12-22 ENCOUNTER — Other Ambulatory Visit (INDEPENDENT_AMBULATORY_CARE_PROVIDER_SITE_OTHER): Payer: Medicare Other

## 2018-12-22 ENCOUNTER — Other Ambulatory Visit: Payer: Self-pay

## 2018-12-22 DIAGNOSIS — I10 Essential (primary) hypertension: Secondary | ICD-10-CM

## 2018-12-22 DIAGNOSIS — E785 Hyperlipidemia, unspecified: Secondary | ICD-10-CM

## 2018-12-22 DIAGNOSIS — E1151 Type 2 diabetes mellitus with diabetic peripheral angiopathy without gangrene: Secondary | ICD-10-CM | POA: Diagnosis not present

## 2018-12-22 DIAGNOSIS — E039 Hypothyroidism, unspecified: Secondary | ICD-10-CM | POA: Diagnosis not present

## 2018-12-22 DIAGNOSIS — Z23 Encounter for immunization: Secondary | ICD-10-CM | POA: Diagnosis not present

## 2018-12-22 LAB — CBC WITH DIFFERENTIAL/PLATELET
Basophils Absolute: 0 10*3/uL (ref 0.0–0.1)
Basophils Relative: 0.4 % (ref 0.0–3.0)
Eosinophils Absolute: 0.1 10*3/uL (ref 0.0–0.7)
Eosinophils Relative: 1.8 % (ref 0.0–5.0)
HCT: 39.7 % (ref 36.0–46.0)
Hemoglobin: 12.9 g/dL (ref 12.0–15.0)
Lymphocytes Relative: 35.3 % (ref 12.0–46.0)
Lymphs Abs: 2.4 10*3/uL (ref 0.7–4.0)
MCHC: 32.4 g/dL (ref 30.0–36.0)
MCV: 85.1 fl (ref 78.0–100.0)
Monocytes Absolute: 0.5 10*3/uL (ref 0.1–1.0)
Monocytes Relative: 7.7 % (ref 3.0–12.0)
Neutro Abs: 3.7 10*3/uL (ref 1.4–7.7)
Neutrophils Relative %: 54.8 % (ref 43.0–77.0)
Platelets: 247 10*3/uL (ref 150.0–400.0)
RBC: 4.67 Mil/uL (ref 3.87–5.11)
RDW: 16.5 % — ABNORMAL HIGH (ref 11.5–15.5)
WBC: 6.8 10*3/uL (ref 4.0–10.5)

## 2018-12-22 LAB — LIPID PANEL
Cholesterol: 136 mg/dL (ref 0–200)
HDL: 49.3 mg/dL (ref >39.00)
LDL Cholesterol: 62 mg/dL (ref 0–99)
NonHDL: 87.18
Total CHOL/HDL Ratio: 3
Triglycerides: 127 mg/dL (ref 0.0–149.0)
VLDL: 25.4 mg/dL (ref 0.0–40.0)

## 2018-12-22 LAB — COMPREHENSIVE METABOLIC PANEL
ALT: 7 U/L (ref 0–35)
AST: 7 U/L (ref 0–37)
Albumin: 3.9 g/dL (ref 3.5–5.2)
Alkaline Phosphatase: 50 U/L (ref 39–117)
BUN: 5 mg/dL — ABNORMAL LOW (ref 6–23)
CO2: 23 mEq/L (ref 19–32)
Calcium: 8.7 mg/dL (ref 8.4–10.5)
Chloride: 104 mEq/L (ref 96–112)
Creatinine, Ser: 0.61 mg/dL (ref 0.40–1.20)
GFR: 97.52 mL/min (ref >60.00)
Glucose, Bld: 159 mg/dL — ABNORMAL HIGH (ref 70–99)
Potassium: 3.6 mEq/L (ref 3.5–5.1)
Sodium: 140 mEq/L (ref 135–145)
Total Bilirubin: 0.4 mg/dL (ref 0.2–1.2)
Total Protein: 6.6 g/dL (ref 6.0–8.3)

## 2018-12-22 LAB — HEMOGLOBIN A1C: Hgb A1c MFr Bld: 5.9 % (ref 4.6–6.5)

## 2018-12-22 LAB — TSH: TSH: 2.83 u[IU]/mL (ref 0.35–4.50)

## 2018-12-23 ENCOUNTER — Encounter: Payer: Self-pay | Admitting: Internal Medicine

## 2018-12-25 ENCOUNTER — Other Ambulatory Visit: Payer: Self-pay | Admitting: Internal Medicine

## 2018-12-26 ENCOUNTER — Other Ambulatory Visit: Payer: Self-pay | Admitting: Internal Medicine

## 2018-12-26 NOTE — Telephone Encounter (Signed)
Medication Refill - Medication: oxybutynin (DITROPAN XL) 10 MG 24 hr tablet [id number]      Preferred Pharmacy (with phone number or street name):  [id number] [address], [place]  [place] [address]  Phone: [phone] Fax: [phone]    Agent: Please be advised that RX refills may take up to 3 business days. We ask that you follow-up with your pharmacy.

## 2019-01-02 ENCOUNTER — Other Ambulatory Visit: Payer: Self-pay | Admitting: Internal Medicine

## 2019-01-03 NOTE — Telephone Encounter (Signed)
[place] Controlled Database Checked Last filled: 11/28/18 # 120 LOV w/you: 11/23/18  Next appt w/you: 02/23/19

## 2019-01-04 ENCOUNTER — Other Ambulatory Visit: Payer: Self-pay | Admitting: Internal Medicine

## 2019-01-04 MED ORDER — LEVOTHYROXINE SODIUM 75 MCG PO TABS: ORAL_TABLET | ORAL | 0 refills | Status: DC

## 2019-01-05 ENCOUNTER — Telehealth: Payer: Self-pay | Admitting: Internal Medicine

## 2019-01-05 MED ORDER — OXYBUTYNIN CHLORIDE ER 10 MG PO TB24: 10.0000 mg | ORAL_TABLET | Freq: Every day | ORAL | 5 refills | Status: DC

## 2019-01-05 NOTE — Telephone Encounter (Signed)
sent 

## 2019-01-05 NOTE — Telephone Encounter (Signed)
Pt is requesting a refill for oxybutynin (DITROPAN XL) 10 MG 24 hr tablet     Pharmacy:  [id number] [address], [place] Phone:  [phone]  Fax:  [phone]

## 2019-02-08 ENCOUNTER — Other Ambulatory Visit: Payer: Self-pay | Admitting: Internal Medicine

## 2019-02-08 MED ORDER — ACETAMINOPHEN-CODEINE 300-30 MG PO TABS: ORAL_TABLET | ORAL | 0 refills | Status: DC

## 2019-02-08 NOTE — Telephone Encounter (Signed)
Check [place] registry last filled 01/03/2019../lmb  

## 2019-02-15 ENCOUNTER — Other Ambulatory Visit: Payer: Self-pay

## 2019-02-15 MED ORDER — OMEPRAZOLE 40 MG PO CPDR: 40.0000 mg | DELAYED_RELEASE_CAPSULE | Freq: Every day | ORAL | 1 refills | Status: DC

## 2019-02-15 NOTE — Telephone Encounter (Signed)
**  Message received through [employer] on  02/14/2019 5:35:25 PM  Medication Refill - Medication: omeprazole (PRILOSEC) 40 MG capsule    Has the patient contacted their pharmacy? No. (Agent: If no, request that the patient contact the pharmacy for the refill.) (Agent: If yes, when and what did the pharmacy advise?)  Preferred Pharmacy (with phone number or street name): [id number] [place], [place]  Agent: Please be advised that RX refills may take up to 3 business days. We ask that you follow-up with your pharmacy.

## 2019-02-19 ENCOUNTER — Encounter: Payer: Self-pay | Admitting: Internal Medicine

## 2019-02-21 ENCOUNTER — Other Ambulatory Visit: Payer: Self-pay | Admitting: Internal Medicine

## 2019-02-22 NOTE — Progress Notes (Signed)
Virtual Visit via Video Note  I connected with Alexis Grant on 02/23/19 at  9:45 AM EST by a video enabled telemedicine application and verified that I am speaking with the correct person using two identifiers.   I discussed the limitations of evaluation and management by telemedicine and the availability of in person appointments. The patient expressed understanding and agreed to proceed.  Present for the visit:  Myself, Dr Billey Gosling, [name].  The patient is currently at home and I am in the office.    No referring provider.    History of Present Illness: The patient is here for follow up for chronic pain management.  She has increased her exercise and that has helped with her pain.    Indication for chronic opioid: bilateral severe hip OA Medication and dose: Tylenol #3 two tabs twice a day # pills per month: 120 Date narcotic database last reviewed: 02/23/19   Pain assessment:  Pain intensity: Moderate-severe at times Amount of pain relief with medication: She is taking the medication consistently and as prescribed Use of pain medications: Appropriate Side effects: None Sleep:   Taking trazodone nightly, disrupted Mood: Depression is well controlled   Overall she feels the pain medication is helping her with her pain and she is happy with her current dose.   Overactive bladder:  At her last visit we started oxybutynin.  The medication does help, but she is still having very frequent urination, especially at night.  It is affecting her quality of life.  Neuropathy: She has been diagnosed with neuropathy.  She states she has never tried any medication before.  Her symptoms have worsened.  She is in both feet and her lower legs.  Is actually on the top of her feet.  She wonders what can be done to help this.    Review of Systems  Constitutional: Negative for chills and fever.  Respiratory: Negative for cough, shortness of breath and wheezing.   Cardiovascular:  Negative for chest pain, palpitations and leg swelling.  Neurological: Negative for headaches.  Psychiatric/Behavioral: Positive for depression (controlled). The patient has insomnia.       Social History   Socioeconomic History  . Marital status: Widowed    Spouse name: Not on file  . Number of children: 3  . Years of education: college  . Highest education level: Not on file  Occupational History  . Occupation: Retired  Tobacco Use  . Smoking status: Never Smoker  . Smokeless tobacco: Never Used  Substance and Sexual Activity  . Alcohol use: No    Alcohol/week: 0.0 standard drinks  . Drug use: No  . Sexual activity: Not Currently  Other Topics Concern  . Not on file  Social History Narrative   Illicit drug use- no   Patient does not get regular exercise due to knee.   Patient lives at home alone.    Patient son and wife lives next door.   Patient has 3 children.    Patient has some college.    Patient retired May 2014.    Social Determinants of Health   Financial Resource Strain:   . Difficulty of Paying Living Expenses: Not on file  Food Insecurity:   . Worried About Charity fundraiser in the Last Year: Not on file  . Ran Out of Food in the Last Year: Not on file  Transportation Needs:   . Lack of Transportation (Medical): Not on file  . Lack of Transportation (Non-Medical):  Not on file  Physical Activity: Insufficiently Active  . Days of Exercise per Week: 5 days  . Minutes of Exercise per Session: 20 min  Stress:   . Feeling of Stress : Not on file  Social Connections:   . Frequency of Communication with Friends and Family: Not on file  . Frequency of Social Gatherings with Friends and Family: Not on file  . Attends Religious Services: Not on file  . Active Member of Clubs or Organizations: Not on file  . Attends Archivist Meetings: Not on file  . Marital Status: Not on file     Observations/Objective: Appears well in NAD Breathing  normally Mood and affect  Assessment and Plan:  See Problem List for Assessment and Plan of chronic medical problems.   Follow Up Instructions:    I discussed the assessment and treatment plan with the patient. The patient was provided an opportunity to ask questions and all were answered. The patient agreed with the plan and demonstrated an understanding of the instructions.   The patient was advised to call back or seek an in-person evaluation if the symptoms worsen or if the condition fails to improve as anticipated.  FU in 3 months  Binnie Rail, MD

## 2019-02-23 ENCOUNTER — Encounter: Payer: Self-pay | Admitting: Internal Medicine

## 2019-02-23 ENCOUNTER — Ambulatory Visit (INDEPENDENT_AMBULATORY_CARE_PROVIDER_SITE_OTHER): Payer: Medicare Other | Admitting: Internal Medicine

## 2019-02-23 DIAGNOSIS — G629 Polyneuropathy, unspecified: Secondary | ICD-10-CM | POA: Diagnosis not present

## 2019-02-23 DIAGNOSIS — M160 Bilateral primary osteoarthritis of hip: Secondary | ICD-10-CM

## 2019-02-23 DIAGNOSIS — N3281 Overactive bladder: Secondary | ICD-10-CM

## 2019-02-23 MED ORDER — OXYBUTYNIN CHLORIDE ER 15 MG PO TB24: 15.0000 mg | ORAL_TABLET | Freq: Every day | ORAL | 5 refills | Status: DC

## 2019-02-23 MED ORDER — GABAPENTIN 100 MG PO CAPS: 100.0000 mg | ORAL_CAPSULE | Freq: Every day | ORAL | 3 refills | Status: DC

## 2019-02-23 NOTE — Assessment & Plan Note (Signed)
Chronic Diagnosed by EMG 2019 Has not tried any medication Currently having pain, numbness and tingling-it has progressed and is more severe now We will try gabapentin at night Follow-up in 3 months

## 2019-02-23 NOTE — Assessment & Plan Note (Signed)
Chronic Severe bilateral hip pain secondary to osteoarthritis Only treatment for her because it is severe is surgery, which she does not want to have Pain management with Tylenol 3-2 tabs twice daily.  This is controlling her pain well and she is happy with medication She is also increased her exercise and thinks that has helped She is taking the medication appropriately without side effects Continue current dose of medication Follow-up in 3 months

## 2019-02-23 NOTE — Assessment & Plan Note (Signed)
Chronic Started oxybutynin 10 mg XL at her last visit and that has helped, but still having frequent urination Discussed that we could try a higher dose-we will try 15 mg XL daily At her next visit if this is not effective can consider a different medication or referral to urology

## 2019-03-01 ENCOUNTER — Other Ambulatory Visit: Payer: Self-pay | Admitting: Internal Medicine

## 2019-03-03 ENCOUNTER — Other Ambulatory Visit: Payer: Self-pay | Admitting: Internal Medicine

## 2019-03-07 ENCOUNTER — Other Ambulatory Visit: Payer: Self-pay | Admitting: Internal Medicine

## 2019-03-19 ENCOUNTER — Telehealth: Payer: Self-pay | Admitting: Internal Medicine

## 2019-03-19 ENCOUNTER — Other Ambulatory Visit: Payer: Self-pay | Admitting: Internal Medicine

## 2019-03-19 MED ORDER — ACETAMINOPHEN-CODEINE 300-30 MG PO TABS: ORAL_TABLET | ORAL | 0 refills | Status: DC

## 2019-03-19 NOTE — Telephone Encounter (Signed)
Last OV 02/23/19 Last RF 02/08/19

## 2019-03-19 NOTE — Telephone Encounter (Signed)
Patient is requesting a refill on the following medication. She is out.  Acetaminophen-Codeine 300-30 MG tablet  Pharmacy on file.

## 2019-03-23 ENCOUNTER — Telehealth (INDEPENDENT_AMBULATORY_CARE_PROVIDER_SITE_OTHER): Payer: Medicare Other | Admitting: Neurology

## 2019-03-23 ENCOUNTER — Other Ambulatory Visit: Payer: Self-pay

## 2019-03-23 ENCOUNTER — Encounter: Payer: Self-pay | Admitting: Neurology

## 2019-03-23 DIAGNOSIS — G40309 Generalized idiopathic epilepsy and epileptic syndromes, not intractable, without status epilepticus: Secondary | ICD-10-CM

## 2019-03-23 DIAGNOSIS — G43109 Migraine with aura, not intractable, without status migrainosus: Secondary | ICD-10-CM | POA: Diagnosis not present

## 2019-03-23 DIAGNOSIS — G629 Polyneuropathy, unspecified: Secondary | ICD-10-CM | POA: Diagnosis not present

## 2019-03-23 MED ORDER — TOPIRAMATE 100 MG PO TABS: 100.0000 mg | ORAL_TABLET | Freq: Two times a day (BID) | ORAL | 3 refills | Status: DC

## 2019-03-23 MED ORDER — DIVALPROEX SODIUM 500 MG PO DR TAB: DELAYED_RELEASE_TABLET | ORAL | 3 refills | Status: DC

## 2019-03-23 MED ORDER — GABAPENTIN 300 MG PO CAPS: ORAL_CAPSULE | ORAL | 3 refills | Status: DC

## 2019-03-23 NOTE — Progress Notes (Signed)
Virtual Visit via Video Note The purpose of this virtual visit is to provide medical care while limiting exposure to the novel coronavirus.    Consent was obtained for video visit:  Yes.   Answered questions that patient had about telehealth interaction:  Yes.   I discussed the limitations, risks, security and privacy concerns of performing an evaluation and management service by telemedicine. I also discussed with the patient that there may be a patient responsible charge related to this service. The patient expressed understanding and agreed to proceed.  Pt location: Home Physician Location: office Name of referring provider:  Binnie Rail, MD I connected with [name] at patients initiation/request on 03/23/2019 at  8:30 AM EST by video enabled telemedicine application and verified that I am speaking with the correct person using two identifiers. Pt MRN:  [id number] Pt DOB:  03-May-1950 Video Participants:  [name]   History of Present Illness:  The patient was seen as a virtual video visit on 03/23/2019. She was last seen in the neurology clinic 7 months ago for seizure, migraines, and neuropathy with frequent falls. 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 She had PT which helped some but not a whole lot, she still does HEP which does help keep her muscles limber. She has not had any falls since her last visit. On her last visit, she was also reporting frequent headaches occurring around three times a week. Topiramate dose increased to 162m BID, this has helped really well, she has occasional headaches but not like before. No side effects. She is on Depakote 5068mBID for seizures and denies any seizures or seizure-like symptom since 2002. She continues to have tremors that she just deals with, she feels they are overall stable. Her main concern today is neuropathic pain, at the worst when she gets settled in the evening that she can't  stand for blankets to touch her feet. She was started on gabapentin 3004mhs last month, she has not noticed much difference, no side effects. Her last HbA1c was good at 5.9  History on Initial Assessment 11/28/2014: This is a pleasant [age] RH woman with a history of hypertension, hyperlipidemia, diabetes, obesity, sleep apnea, CAD s/p MI, with seizures and migraines. She reports seizures started at age 69,48he woke up in the hospital with no prior warning symptoms. Since then, she has had 4 generalized tonic-clonic seizures, last was in 2002. She also report 5 or 6 "little episodes" where she feels dizzy, with blurred vision, then if she sits down quickly and tries to relax, she can avoid any progression. She would feel briefly confused. No associated olfactory/gustatory hallucinations, focal numbness/tingling/weakness, myoclonic jerks. The last time she had these episodes was at least 12 years ago. She recalls taking Neurontin initially, which did not help. Topamax in the past which caused GI symptoms. She has been taking Depakote ER 500m82m AM, 1000mg20mPM for many years now with no side effects. She reports having brain imaging and EEG in the past which were normal, no records available for review.   She reports migraines started at age 69. S38 has an aura of numbness in her face, cheeks, and temple, then she becomes very sensitive to lights, sounds, and smells. She sees flashing lights then her vision becomes blurred, followed by throbbing headaches in the frontal or occipital regions with associated nausea. No associated focal numbness/tingling/weakness in the extremities. She tried Imitrex with minimal effect.  A course of Prednisone   She has chronic neck pain and has been dealing with diarrhea from IBS. She had an MI in 2010.   Epilepsy Risk Factors:  There is a strong family history of seizures in her paternal grandfather, paternal aunt, nephew. Otherwise she had a normal birth and early  development.  There is no history of febrile convulsions, CNS infections such as meningitis/encephalitis, significant traumatic brain injury, neurosurgical procedures.    Current Outpatient Medications on File Prior to Visit  Medication Sig Dispense Refill  . acetaminophen (TYLENOL) 500 MG tablet Take 500 mg by mouth every 6 (six) hours as needed.    . Acetaminophen-Codeine 300-30 MG tablet TAKE 1-2 TABLETS BY MOUTH 2 (TWO) TIMES DAILY AS NEEDED FOR MODERATE PAIN (CHRONIC HIP PAIN). 120 tablet 0  . blood glucose meter kit and supplies KIT Dispense based on patient and insurance preference. Use up to four times daily as directed. (FOR E11.9). 1 each 0  . divalproex (DEPAKOTE) 500 MG DR tablet Take 1 tab twice a day 180 tablet 3  . gabapentin (NEURONTIN) 100 MG capsule Take 1-3 capsules (100-300 mg total) by mouth at bedtime. 90 capsule 3  . glipiZIDE (GLUCOTROL) 10 MG tablet TAKE 1 TABLET (10 MG TOTAL) BY MOUTH 2 (TWO) TIMES DAILY BEFORE A MEAL. 180 tablet 0  . levothyroxine (SYNTHROID) 75 MCG tablet TAKE 1 TABLET BY MOUTH ONCE DAILY BEFORE BREAKFAST 90 tablet 0  . losartan (COZAAR) 50 MG tablet TAKE 1 TABLET BY MOUTH EVERY DAY 90 tablet 0  . metFORMIN (GLUCOPHAGE) 1000 MG tablet Take 1 tablet (1,000 mg total) by mouth 2 (two) times daily with a meal. 360 tablet 1  . omeprazole (PRILOSEC) 40 MG capsule Take 1 capsule (40 mg total) by mouth daily. 90 capsule 1  . ondansetron (ZOFRAN ODT) 4 MG disintegrating tablet Take 1 tablet (4 mg total) by mouth every 8 (eight) hours as needed for nausea or vomiting. 20 tablet 0  . oxybutynin (DITROPAN XL) 15 MG 24 hr tablet Take 1 tablet (15 mg total) by mouth at bedtime. 30 tablet 5  . sertraline (ZOLOFT) 100 MG tablet TAKE 1 TABLET BY MOUTH EVERY DAY 90 tablet 1  . simvastatin (ZOCOR) 20 MG tablet TAKE 1 TABLET BY MOUTH EVERYDAY AT BEDTIME 90 tablet 1  . topiramate (TOPAMAX) 100 MG tablet Take 1 tablet (100 mg total) by mouth 2 (two) times daily. 180 tablet 3   . traZODone (DESYREL) 50 MG tablet TAKE 2 TABLETS BY MOUTH AT BEDTIME FOR SLEEP 180 tablet 0   No current facility-administered medications on file prior to visit.    Observations/Objective:   GEN:  The patient appears stated age and is in NAD.  Neurological examination: Patient is awake, alert, oriented x 3. No aphasia or dysarthria. Intact fluency and comprehension. Remote and recent memory intact. Cranial nerves: Extraocular movements intact with no nystagmus. No facial asymmetry. Motor: moves all extremities symmetrically, at least anti-gravity x 4. There are tremors noted as she is holding her phone.   Assessment and Plan:   This is a pleasant 69 yo RH woman with a history of hypertension, hyperlipidemia, obesity, sleep apnea on CPAP, CAD s/p MI, with a history of convulsive seizures, well-controlled on Depakote with no seizures since 2002. She has had migraines since her teenage years, she has had good response to increase in Topiramate to 166m BID. Her main concern today is neuropathic pain, increase gabapentin to 6074mqhs. We may uptitrate as tolerated.  She has been on chronic Depakote therapy for seizures, this is likely contributing to tremors as well. We may reduce dose in the future, since she is also on Topiramate and Gabapentin which are seizure medications as well. Continue home balance exercises. She will follow-up in 6 months and knows to call for any changes.    Follow Up Instructions:   -I discussed the assessment and treatment plan with the patient. The patient was provided an opportunity to ask questions and all were answered. The patient agreed with the plan and demonstrated an understanding of the instructions.   The patient was advised to call back or seek an in-person evaluation if the symptoms worsen or if the condition fails to improve as anticipated.    Cameron Sprang, MD

## 2019-04-06 ENCOUNTER — Encounter: Payer: Self-pay | Admitting: Adult Health

## 2019-04-06 ENCOUNTER — Other Ambulatory Visit: Payer: Self-pay

## 2019-04-06 ENCOUNTER — Ambulatory Visit (INDEPENDENT_AMBULATORY_CARE_PROVIDER_SITE_OTHER): Payer: Medicare Other | Admitting: Adult Health

## 2019-04-06 DIAGNOSIS — G4733 Obstructive sleep apnea (adult) (pediatric): Secondary | ICD-10-CM

## 2019-04-06 DIAGNOSIS — Z9989 Dependence on other enabling machines and devices: Secondary | ICD-10-CM

## 2019-04-06 NOTE — Patient Instructions (Signed)
CPAP download requested Continue on CPAP at bedtime Wear CPAP each night Do not drive if sleepy Work on healthy weight Follow-up in 1 year and as needed with Dr. Halford Chessman

## 2019-04-06 NOTE — Progress Notes (Signed)
Virtual Visit via Telephone Note  I connected with [name] on 04/06/19 at 11:00 AM EDT by telephone and verified that I am speaking with the correct person using two identifiers.  Location: Patient: Home  Provider: Office    I discussed the limitations, risks, security and privacy concerns of performing an evaluation and management service by telephone and the availability of in person appointments. I also discussed with the patient that there may be a patient responsible charge related to this service. The patient expressed understanding and agreed to proceed.   History of Present Illness: 69 yo followed for obstructive sleep apnea Today's televisit is a follow-up for sleep apnea.  Patient was last seen in May 2019.  Patient is on nocturnal CPAP.  Says she is doing well on CPAP.  She wears it every night for about 5 hours.  States she has no issues with that she has a so clean machine that really is very helpful.  Patient says that she needs new supplies.  Try to get a CPAP download but was unable today.  Reach out to her homecare company for download.  Observations/Objective: Speaks in full sentences with no audible distress  HST 12/03/16 >> AHI 24.4, SaO2 low 79%. Auto CPAP 04/17/17 to 05/16/17 >> used on 29 of 30 nights with average AHI 5 hrs 23.  Average AHI 2.5 with median CPAP 7 and 95 th percentile CPAP 9 cm H2O  Assessment and Plan: Obstructive sleep apnea-good clinical benefit.  Continue on CPAP. We will reach out to DME for CPAP download.  Obesity work on healthy weight.  Plan  Patient Instructions  CPAP download requested Continue on CPAP at bedtime Wear CPAP each night Do not drive if sleepy Work on healthy weight Follow-up in 1 year and as needed with Dr. Halford Chessman       Follow Up Instructions: Follow-up in 1 year and as needed   I discussed the assessment and treatment plan with the patient. The patient was provided an opportunity to ask questions and all were  answered. The patient agreed with the plan and demonstrated an understanding of the instructions.   The patient was advised to call back or seek an in-person evaluation if the symptoms worsen or if the condition fails to improve as anticipated.  I provided 21  minutes of non-face-to-face time during this encounter.   Rexene Edison, NP

## 2019-04-07 NOTE — Progress Notes (Signed)
Reviewed and agree with assessment/plan.   Attallah Ontko, MD [facility] 01/14/2016, 12:24 PM Pager:  [phone]  

## 2019-04-15 ENCOUNTER — Other Ambulatory Visit: Payer: Self-pay | Admitting: Internal Medicine

## 2019-04-16 NOTE — Telephone Encounter (Signed)
Last OV 02/23/19 Next OV 05/28/19 Last RF 03/19/19

## 2019-04-27 DIAGNOSIS — Z23 Encounter for immunization: Secondary | ICD-10-CM | POA: Diagnosis not present

## 2019-05-22 ENCOUNTER — Other Ambulatory Visit: Payer: Self-pay | Admitting: Internal Medicine

## 2019-05-22 NOTE — Telephone Encounter (Signed)
Last OV 02/23/19 with f/u scheduled for 05/28/19.  Last refill 04/16/19.

## 2019-05-23 ENCOUNTER — Telehealth: Payer: Self-pay | Admitting: Internal Medicine

## 2019-05-23 NOTE — Progress Notes (Signed)
  Chronic Care Management   Outreach Note  05/23/2019 Name: Alexis Grant MRN: [id number] DOB: 12-14-1950  Referred by: Binnie Rail, MD Reason for referral : No chief complaint on file.   An unsuccessful telephone outreach was attempted today. The patient was referred to the pharmacist for assistance with care management and care coordination.    This note is not being shared with the patient for the following reason: To respect privacy (The patient or proxy has requested that the information not be shared).  Follow Up Plan:   Raynicia Dukes [employer] Scheduler

## 2019-05-25 DIAGNOSIS — Z23 Encounter for immunization: Secondary | ICD-10-CM | POA: Diagnosis not present

## 2019-05-27 ENCOUNTER — Other Ambulatory Visit: Payer: Self-pay | Admitting: Internal Medicine

## 2019-05-27 NOTE — Patient Instructions (Signed)
  Blood work was ordered.     Medications reviewed and updated.  Changes include :     Your prescription(s) have been submitted to your pharmacy. Please take as directed and contact our office if you believe you are having problem(s) with the medication(s).  A referral was ordered for        Someone will call you to schedule this.    Please followup in 4 months

## 2019-05-27 NOTE — Progress Notes (Signed)
Subjective:    Patient ID: Alexis Grant, female    DOB: 1950/06/14, 69 y.o.   MRN: [id number]  HPI The patient is here for follow up of their chronic medical problems, including pain management for severe OA in hips, htn, DM, hyperlipidemia, hypothyroidism, depression, insomnia, overactive bladder.   She is taking all of her medications as prescribed.   She is not exercising regularly.      chronic pain management:  Indication for chronic opioid: bilateral severe OA in hips Medication and dose: tylenol #3 2 tabs BID # pills per month: 120 Date narcotic database last reviewed: 05/28/19  Pain assessment:  Pain intensity: mod-severe Amount of pain relief with medication:  Use of pain medications: appropriate Side effects: none Sleep:  Mood:      Medications and allergies reviewed with patient and updated if appropriate.  Patient Active Problem List   Diagnosis Date Noted  . Overactive bladder 11/23/2018  . Frequent UTI 05/03/2018  . Lump of skin 12/30/2017  . Watery eyes 12/30/2017  . Polyneuropathy 09/27/2017  . Osteoarthritis, hip, bilateral 05/25/2017  . Osteopenia 11/22/2016  . GERD (gastroesophageal reflux disease) 11/15/2016  . Difficulty sleeping 05/24/2016  . Rosacea 11/25/2015  . Poor balance 11/25/2015  . Tremor 04/18/2015  . Other fatigue 02/17/2015  . Migraine with aura and without status migrainosus, not intractable 12/04/2014  . Localization-related (focal) (partial) idiopathic epilepsy and epileptic syndromes with seizures of localized onset, not intractable, without status epilepticus ([employer]) 12/04/2014  . Hx of adenomatous colonic polyps 07/16/2014  . Benign neoplasm of ascending colon 07/16/2014  . Benign neoplasm of descending colon 07/16/2014  . Benign neoplasm of cecum 07/16/2014  . Benign neoplasm of transverse colon 07/16/2014  . Fibromyalgia 05/30/2013  . Generalized convulsive epilepsy ([place]) 10/20/2012  . Seasonal allergies 12/08/2011    . DM (diabetes mellitus), type 2 with peripheral vascular complications ([place]) 24/09/7351  . Essential hypertension 01/05/2010  . Irritable bowel syndrome 01/05/2010  . VERTIGO 09/16/2009  . VITAMIN D DEFICIENCY 02/24/2009  . MYOCARDIAL INFARCTION, ACUTE, SUBENDOCARDIAL 10/04/2008  . Hypothyroidism 03/19/2008  . COLONIC POLYPS, ADENOMATOUS, HX OF 08/07/2007  . HEMANGIOMA 04/26/2007  . Dyslipidemia 04/26/2007  . OBESITY 04/26/2007  . Depression 04/26/2007  . SLEEP APNEA, OBSTRUCTIVE 04/26/2007  . HIATAL HERNIA 04/26/2007  . FATTY LIVER DISEASE 04/26/2007    Current Outpatient Medications on File Prior to Visit  Medication Sig Dispense Refill  . Acetaminophen-Codeine 300-30 MG tablet TAKE 1-2 TABLETS BY MOUTH 2 (TWO) TIMES DAILY AS NEEDED FOR MODERATE PAIN (CHRONIC HIP PAIN). 120 tablet 0  . acetaminophen (TYLENOL) 500 MG tablet Take 500 mg by mouth every 6 (six) hours as needed.    . blood glucose meter kit and supplies KIT Dispense based on patient and insurance preference. Use up to four times daily as directed. (FOR E11.9). 1 each 0  . divalproex (DEPAKOTE) 500 MG DR tablet Take 1 tab twice a day 180 tablet 3  . gabapentin (NEURONTIN) 300 MG capsule Take 2 caps every night 180 capsule 3  . glipiZIDE (GLUCOTROL) 10 MG tablet TAKE 1 TABLET (10 MG TOTAL) BY MOUTH 2 (TWO) TIMES DAILY BEFORE A MEAL. 180 tablet 0  . levothyroxine (SYNTHROID) 75 MCG tablet TAKE 1 TABLET BY MOUTH ONCE DAILY BEFORE BREAKFAST 90 tablet 0  . losartan (COZAAR) 50 MG tablet TAKE 1 TABLET BY MOUTH EVERY DAY 90 tablet 0  . metFORMIN (GLUCOPHAGE) 1000 MG tablet Take 1 tablet (1,000 mg total) by mouth 2 (  two) times daily with a meal. 360 tablet 1  . omeprazole (PRILOSEC) 40 MG capsule Take 1 capsule (40 mg total) by mouth daily. 90 capsule 1  . ondansetron (ZOFRAN ODT) 4 MG disintegrating tablet Take 1 tablet (4 mg total) by mouth every 8 (eight) hours as needed for nausea or vomiting. 20 tablet 0  . oxybutynin  (DITROPAN XL) 15 MG 24 hr tablet Take 1 tablet (15 mg total) by mouth at bedtime. 30 tablet 5  . sertraline (ZOLOFT) 100 MG tablet TAKE 1 TABLET BY MOUTH EVERY DAY 90 tablet 1  . simvastatin (ZOCOR) 20 MG tablet TAKE 1 TABLET BY MOUTH EVERYDAY AT BEDTIME 90 tablet 1  . topiramate (TOPAMAX) 100 MG tablet Take 1 tablet (100 mg total) by mouth 2 (two) times daily. 180 tablet 3  . traZODone (DESYREL) 50 MG tablet TAKE 2 TABLETS BY MOUTH AT BEDTIME FOR SLEEP 180 tablet 0   No current facility-administered medications on file prior to visit.    Past Medical History:  Diagnosis Date  . Adenomatous polyp of colon 2008  . Allergy   . Anemia   . Anxiety   . Arthritis   . Blood transfusion without reported diagnosis    following knee surgery   . Cataract    hx- removed both eyes  . Depression   . Diabetes mellitus    type II   . Dyslipidemia   . Fibromyalgia   . Gallstones   . Gastritis   . GERD (gastroesophageal reflux disease)   . Hemangioma   . Hiatal hernia    Fibromyalgia  . Hyperlipidemia   . Hypertension   . Hypothyroidism 2010   Dr.Deveshawr  . IBS (irritable bowel syndrome)    Dr Fuller Plan  . Migraines   . Myocardial infarct ([place]) 2010   subendocardrial, following R TKR  . Obesity   . OSA (obstructive sleep apnea)    cpap - does not know settings   . Pneumonia   . Seizure disorder ([place])   . Seizures ([place])    last seizure was 2002 per pt 07-29-16  . Sleep apnea   . Urinary tract infection     Past Surgical History:  Procedure Laterality Date  . ABDOMINAL HYSTERECTOMY  1988  . CARDIAC CATHETERIZATION  2007   DrGamble ( now seeing Dr Tonita Cong cardiology)  . CHOLECYSTECTOMY  1985  . COLONOSCOPY    . COLONOSCOPY WITH PROPOFOL N/A 07/16/2014   Procedure: COLONOSCOPY WITH PROPOFOL;  Surgeon: Ladene Artist, MD;  Location: [facility];  Service: Endoscopy;  Laterality: N/A;  . COLONOSCOPY WITH PROPOFOL N/A 09/21/2016   Procedure: COLONOSCOPY WITH PROPOFOL;  Surgeon:  Ladene Artist, MD;  Location: [facility];  Service: Endoscopy;  Laterality: N/A;  . POLYPECTOMY  2012   6 or 7 adenomatous, Dr.Stark  . steroid injection to left si joint  12/2008   Dr.Newton   . TOTAL KNEE ARTHROPLASTY Right 11/2005  . TOTAL KNEE ARTHROPLASTY Left   . TUBAL LIGATION  1986    Social History   Socioeconomic History  . Marital status: Widowed    Spouse name: Not on file  . Number of children: 3  . Years of education: college  . Highest education level: Not on file  Occupational History  . Occupation: Retired  Tobacco Use  . Smoking status: Never Smoker  . Smokeless tobacco: Never Used  Substance and Sexual Activity  . Alcohol use: No    Alcohol/week: 0.0 standard drinks  .  Drug use: No  . Sexual activity: Not Currently  Other Topics Concern  . Not on file  Social History Narrative   Illicit drug use- no   Patient does not get regular exercise due to knee.   Patient lives at home alone.    Patient son and wife lives next door.   Patient has 3 children.    Patient has some college.    Patient retired May 2014.    Social Determinants of Health   Financial Resource Strain:   . Difficulty of Paying Living Expenses:   Food Insecurity:   . Worried About Charity fundraiser in the Last Year:   . Arboriculturist in the Last Year:   Transportation Needs:   . Film/video editor (Medical):   [name] Lack of Transportation (Non-Medical):   Physical Activity: Insufficiently Active  . Days of Exercise per Week: 5 days  . Minutes of Exercise per Session: 20 min  Stress:   . Feeling of Stress :   Social Connections:   . Frequency of Communication with Friends and Family:   . Frequency of Social Gatherings with Friends and Family:   . Attends Religious Services:   . Active Member of Clubs or Organizations:   . Attends Archivist Meetings:   [name] Marital Status:     Family History  Problem Relation Age of Onset  . Colon polyps Mother   . Ulcers  Father   . Colon polyps Brother   . Diabetes Sister   . Kidney failure Sister   . Diabetes Paternal Aunt   . Esophageal cancer Brother   . Kidney cancer Sister   . Renal cancer Sister   . Colon cancer Neg Hx   . Heart attack Neg Hx   . Stroke Neg Hx   . Cancer Neg Hx   . Gallbladder disease Neg Hx   . Rectal cancer Neg Hx   . Stomach cancer Neg Hx     Review of Systems     Objective:  There were no vitals filed for this visit. BP Readings from Last 3 Encounters:  03/03/18 120/76  01/09/18 137/73  12/30/17 120/76   Wt Readings from Last 3 Encounters:  03/21/19 262 lb (118.8 kg)  08/30/18 262 lb (118.8 kg)  03/03/18 272 lb (123.4 kg)   There is no height or weight on file to calculate BMI.   Physical Exam    Constitutional: Appears well-developed and well-nourished. No distress.  HENT:  Head: Normocephalic and atraumatic.  Neck: Neck supple. No tracheal deviation present. No thyromegaly present.  No cervical lymphadenopathy Cardiovascular: Normal rate, regular rhythm and normal heart sounds.   No murmur heard. No carotid bruit .  No edema Pulmonary/Chest: Effort normal and breath sounds normal. No respiratory distress. No has no wheezes. No rales.  Skin: Skin is warm and dry. Not diaphoretic.  Psychiatric: Normal mood and affect. Behavior is normal.      Assessment & Plan:    See Problem List for Assessment and Plan of chronic medical problems.    This visit occurred during the SARS-CoV-2 public health emergency.  Safety protocols were in place, including screening questions prior to the visit, additional usage of staff PPE, and extensive cleaning of exam room while observing appropriate contact time as indicated for disinfecting solutions.    This encounter was created in error - please disregard.

## 2019-05-28 ENCOUNTER — Encounter: Payer: Medicare Other | Admitting: Internal Medicine

## 2019-05-29 ENCOUNTER — Other Ambulatory Visit: Payer: Self-pay | Admitting: Internal Medicine

## 2019-05-31 NOTE — Patient Instructions (Addendum)
  Blood work was ordered.     Medications reviewed and updated.  Changes include :   Increase sertraline to 150 mg.  Your prescription(s) have been submitted to your pharmacy. Please take as directed and contact our office if you believe you are having problem(s) with the medication(s).   Please followup in 3 months

## 2019-05-31 NOTE — Progress Notes (Signed)
Subjective:    Patient ID: Alexis Grant, female    DOB: [birth year], 69 y.o.   MRN: [id number]  HPI The patient is here for follow up of their chronic medical problems, including pain management for severe OA in hips, htn, DM, hyperlipidemia, hypothyroidism, depression, insomnia, overactive bladder.   She is taking all of her medications as prescribed.   She is not exercising regularly.    She does not eat anything and does not know how she has gained that much weight.  She feels it is water weight.   chronic pain management:  Indication for chronic opioid: bilateral severe OA in hips Medication and dose: tylenol #3 2 tabs BID # pills per month: 120 Date narcotic database last reviewed: 05/28/19  Pain assessment:  Pain intensity: mod-severe Amount of pain relief with medication: it helps Use of pain medications: appropriate Side effects: none Sleep: good with trazodone Mood: feels depressed from pain and not being able to do what she wants to do    She is having tailbone pain.  It hurts when sitting on hard surfaces and it is difficult to get up and hurts when she walks around for a while.  About 20 years ago she had an injection in her tailbone region due to pain.    Medications and allergies reviewed with patient and updated if appropriate.  Patient Active Problem List   Diagnosis Date Noted  . Overactive bladder 11/23/2018  . Frequent UTI 05/03/2018  . Lump of skin 12/30/2017  . Watery eyes 12/30/2017  . Polyneuropathy 09/27/2017  . Osteoarthritis, hip, bilateral 05/25/2017  . Osteopenia 11/22/2016  . GERD (gastroesophageal reflux disease) 11/15/2016  . Difficulty sleeping 05/24/2016  . Rosacea 11/25/2015  . Poor balance 11/25/2015  . Tremor 04/18/2015  . Other fatigue 02/17/2015  . Migraine with aura and without status migrainosus, not intractable 12/04/2014  . Localization-related (focal) (partial) idiopathic epilepsy and epileptic syndromes with seizures of  localized onset, not intractable, without status epilepticus ([place]) 12/04/2014  . Hx of adenomatous colonic polyps 07/16/2014  . Benign neoplasm of ascending colon 07/16/2014  . Benign neoplasm of descending colon 07/16/2014  . Benign neoplasm of cecum 07/16/2014  . Benign neoplasm of transverse colon 07/16/2014  . Fibromyalgia 05/30/2013  . Generalized convulsive epilepsy ([place]) 10/20/2012  . Seasonal allergies 12/08/2011  . DM (diabetes mellitus), type 2 with peripheral vascular complications ([place]) 21/22/4825  . Essential hypertension 01/05/2010  . Irritable bowel syndrome 01/05/2010  . VERTIGO 09/16/2009  . VITAMIN D DEFICIENCY 02/24/2009  . MYOCARDIAL INFARCTION, ACUTE, SUBENDOCARDIAL 10/04/2008  . Hypothyroidism 03/19/2008  . COLONIC POLYPS, ADENOMATOUS, HX OF 08/07/2007  . HEMANGIOMA 04/26/2007  . Dyslipidemia 04/26/2007  . OBESITY 04/26/2007  . Depression 04/26/2007  . SLEEP APNEA, OBSTRUCTIVE 04/26/2007  . HIATAL HERNIA 04/26/2007  . FATTY LIVER DISEASE 04/26/2007    Current Outpatient Medications on File Prior to Visit  Medication Sig Dispense Refill  . acetaminophen (TYLENOL) 500 MG tablet Take 500 mg by mouth every 6 (six) hours as needed.    . Acetaminophen-Codeine 300-30 MG tablet TAKE 1-2 TABLETS BY MOUTH 2 (TWO) TIMES DAILY AS NEEDED FOR MODERATE PAIN (CHRONIC HIP PAIN). 120 tablet 0  . blood glucose meter kit and supplies KIT Dispense based on patient and insurance preference. Use up to four times daily as directed. (FOR E11.9). 1 each 0  . divalproex (DEPAKOTE) 500 MG DR tablet Take 1 tab twice a day 180 tablet 3  . gabapentin (NEURONTIN) 300 MG  capsule Take 2 caps every night 180 capsule 3  . glipiZIDE (GLUCOTROL) 10 MG tablet TAKE 1 TABLET (10 MG TOTAL) BY MOUTH 2 (TWO) TIMES DAILY BEFORE A MEAL. 180 tablet 1  . levothyroxine (SYNTHROID) 75 MCG tablet TAKE 1 TABLET BY MOUTH ONCE DAILY BEFORE BREAKFAST 90 tablet 0  . losartan (COZAAR) 50 MG tablet TAKE 1 TABLET BY  MOUTH EVERY DAY 90 tablet 1  . metFORMIN (GLUCOPHAGE) 1000 MG tablet Take 1 tablet (1,000 mg total) by mouth 2 (two) times daily with a meal. 360 tablet 1  . omeprazole (PRILOSEC) 40 MG capsule Take 1 capsule (40 mg total) by mouth daily. 90 capsule 1  . ondansetron (ZOFRAN ODT) 4 MG disintegrating tablet Take 1 tablet (4 mg total) by mouth every 8 (eight) hours as needed for nausea or vomiting. 20 tablet 0  . oxybutynin (DITROPAN XL) 15 MG 24 hr tablet Take 1 tablet (15 mg total) by mouth at bedtime. 30 tablet 5  . sertraline (ZOLOFT) 100 MG tablet TAKE 1 TABLET BY MOUTH EVERY DAY 90 tablet 1  . simvastatin (ZOCOR) 20 MG tablet TAKE 1 TABLET BY MOUTH EVERYDAY AT BEDTIME 90 tablet 1  . topiramate (TOPAMAX) 100 MG tablet Take 1 tablet (100 mg total) by mouth 2 (two) times daily. 180 tablet 3  . traZODone (DESYREL) 50 MG tablet TAKE 2 TABLETS BY MOUTH AT BEDTIME FOR SLEEP 180 tablet 0   No current facility-administered medications on file prior to visit.    Past Medical History:  Diagnosis Date  . Adenomatous polyp of colon 2008  . Allergy   . Anemia   . Anxiety   . Arthritis   . Blood transfusion without reported diagnosis    following knee surgery   . Cataract    hx- removed both eyes  . Depression   . Diabetes mellitus    type II   . Dyslipidemia   . Fibromyalgia   . Gallstones   . Gastritis   . GERD (gastroesophageal reflux disease)   . Hemangioma   . Hiatal hernia    Fibromyalgia  . Hyperlipidemia   . Hypertension   . Hypothyroidism 2010   Dr.Deveshawr  . IBS (irritable bowel syndrome)    Dr Fuller Plan  . Migraines   . Myocardial infarct ([place]) 2010   subendocardrial, following R TKR  . Obesity   . OSA (obstructive sleep apnea)    cpap - does not know settings   . Pneumonia   . Seizure disorder ([place])   . Seizures ([place])    last seizure was 2002 per pt 07-29-16  . Sleep apnea   . Urinary tract infection     Past Surgical History:  Procedure Laterality Date  .  ABDOMINAL HYSTERECTOMY  1988  . CARDIAC CATHETERIZATION  2007   DrGamble ( now seeing Dr Tonita Cong cardiology)  . CHOLECYSTECTOMY  1985  . COLONOSCOPY    . COLONOSCOPY WITH PROPOFOL N/A 07/16/2014   Procedure: COLONOSCOPY WITH PROPOFOL;  Surgeon: Ladene Artist, MD;  Location[facility];  Service: Endoscopy;  Laterality: N/A;  . COLONOSCOPY WITH PROPOFOL N/A 09/21/2016   Procedure: COLONOSCOPY WITH PROPOFOL;  Surgeon: Ladene Artist, MD;  Location[facility];  Service: Endoscopy;  Laterality: N/A;  . POLYPECTOMY  2012   6 or 7 adenomatous, Dr.Stark  . steroid injection to left si joint  12/2008   Dr.Newton   . TOTAL KNEE ARTHROPLASTY Right 11/2005  . TOTAL KNEE ARTHROPLASTY Left   . TUBAL  LIGATION  1986    Social History   Socioeconomic History  . Marital status: Widowed    Spouse name: Not on file  . Number of children: 3  . Years of education: college  . Highest education level: Not on file  Occupational History  . Occupation: Retired  Tobacco Use  . Smoking status: Never Smoker  . Smokeless tobacco: Never Used  Substance and Sexual Activity  . Alcohol use: No    Alcohol/week: 0.0 standard drinks  . Drug use: No  . Sexual activity: Not Currently  Other Topics Concern  . Not on file  Social History Narrative   Illicit drug use- no   Patient does not get regular exercise due to knee.   Patient lives at home alone.    Patient son and wife lives next door.   Patient has 3 children.    Patient has some college.    Patient retired May 2014.    Social Determinants of Health   Financial Resource Strain:   . Difficulty of Paying Living Expenses:   Food Insecurity:   . Worried About Charity fundraiser in the Last Year:   . Arboriculturist in the Last Year:   Transportation Needs:   . Film/video editor (Medical):   [name] Lack of Transportation (Non-Medical):   Physical Activity: Insufficiently Active  . Days of Exercise per Week: 5 days  . Minutes of  Exercise per Session: 20 min  Stress:   . Feeling of Stress :   Social Connections:   . Frequency of Communication with Friends and Family:   . Frequency of Social Gatherings with Friends and Family:   . Attends Religious Services:   . Active Member of Clubs or Organizations:   . Attends Archivist Meetings:   [name] Marital Status:     Family History  Problem Relation Age of Onset  . Colon polyps Mother   . Ulcers Father   . Colon polyps Brother   . Diabetes Sister   . Kidney failure Sister   . Diabetes Paternal Aunt   . Esophageal cancer Brother   . Kidney cancer Sister   . Renal cancer Sister   . Colon cancer Neg Hx   . Heart attack Neg Hx   . Stroke Neg Hx   . Cancer Neg Hx   . Gallbladder disease Neg Hx   . Rectal cancer Neg Hx   . Stomach cancer Neg Hx     Review of Systems  Constitutional: Negative for chills and fever.  Respiratory: Positive for shortness of breath (with exertion). Negative for cough and wheezing.   Cardiovascular: Positive for chest pain (rare - sharp pain in middle of chest - lasts for few sec) and leg swelling. Negative for palpitations.  Neurological: Positive for headaches (severe x few weeks).       Objective:   Vitals:   06/01/19 1059  BP: 128/74  Pulse: 75  Resp: 18  Temp: 98.3 F (36.8 C)  SpO2: 97%   BP Readings from Last 3 Encounters:  06/01/19 128/74  03/03/18 120/76  01/09/18 137/73   Wt Readings from Last 3 Encounters:  06/01/19 (!) 316 lb (143.3 kg)  03/21/19 262 lb (118.8 kg)  08/30/18 262 lb (118.8 kg)   Body mass index is 56.88 kg/m.   Physical Exam    Constitutional: Appears well-developed and well-nourished. No distress.  HENT:  Head: Normocephalic and atraumatic.  Neck: Neck supple. No tracheal deviation  present. No thyromegaly present.  No cervical lymphadenopathy Cardiovascular: Normal rate, regular rhythm and normal heart sounds.   No murmur heard. No carotid bruit .  Trace b/l  edema Pulmonary/Chest: Effort normal and breath sounds normal. No respiratory distress. No has no wheezes. No rales.  Skin: Skin is warm and dry. Not diaphoretic.  Psychiatric: Normal mood and affect. Behavior is normal.      Assessment & Plan:    See Problem List for Assessment and Plan of chronic medical problems.    This visit occurred during the SARS-CoV-2 public health emergency.  Safety protocols were in place, including screening questions prior to the visit, additional usage of staff PPE, and extensive cleaning of exam room while observing appropriate contact time as indicated for disinfecting solutions.

## 2019-06-01 ENCOUNTER — Encounter: Payer: Self-pay | Admitting: Internal Medicine

## 2019-06-01 ENCOUNTER — Other Ambulatory Visit: Payer: Self-pay

## 2019-06-01 ENCOUNTER — Ambulatory Visit (INDEPENDENT_AMBULATORY_CARE_PROVIDER_SITE_OTHER): Payer: Medicare Other | Admitting: Internal Medicine

## 2019-06-01 VITALS — BP 128/74 | HR 75 | Temp 98.3°F | Resp 18 | Ht 62.5 in | Wt 316.0 lb

## 2019-06-01 DIAGNOSIS — I10 Essential (primary) hypertension: Secondary | ICD-10-CM

## 2019-06-01 DIAGNOSIS — E785 Hyperlipidemia, unspecified: Secondary | ICD-10-CM | POA: Diagnosis not present

## 2019-06-01 DIAGNOSIS — M160 Bilateral primary osteoarthritis of hip: Secondary | ICD-10-CM | POA: Diagnosis not present

## 2019-06-01 DIAGNOSIS — F3289 Other specified depressive episodes: Secondary | ICD-10-CM

## 2019-06-01 DIAGNOSIS — R600 Localized edema: Secondary | ICD-10-CM | POA: Insufficient documentation

## 2019-06-01 DIAGNOSIS — E1151 Type 2 diabetes mellitus with diabetic peripheral angiopathy without gangrene: Secondary | ICD-10-CM

## 2019-06-01 DIAGNOSIS — G479 Sleep disorder, unspecified: Secondary | ICD-10-CM

## 2019-06-01 DIAGNOSIS — E039 Hypothyroidism, unspecified: Secondary | ICD-10-CM | POA: Diagnosis not present

## 2019-06-01 DIAGNOSIS — N3281 Overactive bladder: Secondary | ICD-10-CM

## 2019-06-01 LAB — COMPREHENSIVE METABOLIC PANEL
ALT: 4 U/L (ref 0–35)
AST: 6 U/L (ref 0–37)
Albumin: 3.7 g/dL (ref 3.5–5.2)
Alkaline Phosphatase: 56 U/L (ref 39–117)
BUN: 6 mg/dL (ref 6–23)
CO2: 29 mEq/L (ref 19–32)
Calcium: 8.9 mg/dL (ref 8.4–10.5)
Chloride: 105 mEq/L (ref 96–112)
Creatinine, Ser: 0.54 mg/dL (ref 0.40–1.20)
GFR: 112.11 mL/min (ref >60.00)
Glucose, Bld: 115 mg/dL — ABNORMAL HIGH (ref 70–99)
Potassium: 4.4 mEq/L (ref 3.5–5.1)
Sodium: 139 mEq/L (ref 135–145)
Total Bilirubin: 0.2 mg/dL (ref 0.2–1.2)
Total Protein: 6.5 g/dL (ref 6.0–8.3)

## 2019-06-01 LAB — CBC WITH DIFFERENTIAL/PLATELET
Basophils Absolute: 0 10*3/uL (ref 0.0–0.1)
Basophils Relative: 0.6 % (ref 0.0–3.0)
Eosinophils Absolute: 0.2 10*3/uL (ref 0.0–0.7)
Eosinophils Relative: 2.7 % (ref 0.0–5.0)
HCT: 37.7 % (ref 36.0–46.0)
Hemoglobin: 12.3 g/dL (ref 12.0–15.0)
Lymphocytes Relative: 37.8 % (ref 12.0–46.0)
Lymphs Abs: 2.4 10*3/uL (ref 0.7–4.0)
MCHC: 32.6 g/dL (ref 30.0–36.0)
MCV: 84.4 fl (ref 78.0–100.0)
Monocytes Absolute: 0.5 10*3/uL (ref 0.1–1.0)
Monocytes Relative: 7.6 % (ref 3.0–12.0)
Neutro Abs: 3.2 10*3/uL (ref 1.4–7.7)
Neutrophils Relative %: 51.3 % (ref 43.0–77.0)
Platelets: 213 10*3/uL (ref 150.0–400.0)
RBC: 4.47 Mil/uL (ref 3.87–5.11)
RDW: 16.6 % — ABNORMAL HIGH (ref 11.5–15.5)
WBC: 6.3 10*3/uL (ref 4.0–10.5)

## 2019-06-01 LAB — LIPID PANEL
Cholesterol: 158 mg/dL (ref 0–200)
HDL: 50.8 mg/dL (ref >39.00)
LDL Cholesterol: 81 mg/dL (ref 0–99)
NonHDL: 107.05
Total CHOL/HDL Ratio: 3
Triglycerides: 130 mg/dL (ref 0.0–149.0)
VLDL: 26 mg/dL (ref 0.0–40.0)

## 2019-06-01 LAB — BRAIN NATRIURETIC PEPTIDE: Pro B Natriuretic peptide (BNP): 110 pg/mL — ABNORMAL HIGH (ref 0.0–100.0)

## 2019-06-01 LAB — HEMOGLOBIN A1C: Hgb A1c MFr Bld: 5.9 % (ref 4.6–6.5)

## 2019-06-01 LAB — TSH: TSH: 4.17 u[IU]/mL (ref 0.35–4.50)

## 2019-06-01 MED ORDER — SERTRALINE HCL 100 MG PO TABS: 150.0000 mg | ORAL_TABLET | Freq: Every day | ORAL | 1 refills | Status: DC

## 2019-06-01 NOTE — Assessment & Plan Note (Signed)
Chronic Severe b/l hip primary OA Taking tylenol #3 - takes 2 tabs BID She is taking her medication appopriately Will continue current dose Follow up in 4 months

## 2019-06-01 NOTE — Assessment & Plan Note (Signed)
Chronic Depression not ideally controlled Depressed about chronic pain and not being able to do everything she wants.  Will try increase the sertraline to 150 mg daily to see if that helps

## 2019-06-02 NOTE — Assessment & Plan Note (Signed)
Chronic Continue current medications Check a1c Low sugar / carb diet Stressed as much exercise as possible Stressed weight loss we discussed this at length

## 2019-06-02 NOTE — Assessment & Plan Note (Signed)
Chronic BP well controlled Current regimen effective and well tolerated Continue current medications at current doses cmp  

## 2019-06-02 NOTE — Assessment & Plan Note (Signed)
Chronic Not controlled Increase sertraline to 150 mg daily

## 2019-06-02 NOTE — Assessment & Plan Note (Signed)
Chronic controlled 

## 2019-06-02 NOTE — Assessment & Plan Note (Signed)
Chronic Check lipid panel  Continue daily statin Regular exercise and healthy diet encouraged  

## 2019-06-02 NOTE — Assessment & Plan Note (Signed)
Chronic Continue oxybutynin at current dose

## 2019-06-02 NOTE — Assessment & Plan Note (Signed)
Chronic  Clinically euthyroid Check tsh  Titrate med dose if needed  

## 2019-06-04 ENCOUNTER — Telehealth: Payer: Self-pay | Admitting: Internal Medicine

## 2019-06-04 MED ORDER — FUROSEMIDE 20 MG PO TABS: 20.0000 mg | ORAL_TABLET | Freq: Every day | ORAL | 0 refills | Status: DC

## 2019-06-04 NOTE — Telephone Encounter (Signed)
Please call her regarding her blood work results-  Her A1c is 5.9%, so her sugars are stable in the prediabetic range.  Her kidney function and liver tests are normal.  Her cholesterol is very good.  Her thyroid function and blood counts are normal.  A blood test does indicate possibly a little extra fluid in her system.  If she wants we could consider a few days of a water pill to see if that gets rid of some of the extra fluid.  It would possibly lower her blood pressure and cause lightheadedness.  We would only do this for a few days to see if it helped.  It would cause increased urination.  Let me know.

## 2019-06-04 NOTE — Telephone Encounter (Signed)
Pt aware of response below. Will do the fluid pill. Send to [facility]

## 2019-06-06 ENCOUNTER — Other Ambulatory Visit: Payer: Self-pay | Admitting: Internal Medicine

## 2019-06-22 ENCOUNTER — Other Ambulatory Visit: Payer: Self-pay | Admitting: Internal Medicine

## 2019-06-22 NOTE — Telephone Encounter (Signed)
Last RF 05/22/19 Last OV 06/01/19 Next OV 09/07/19

## 2019-06-29 ENCOUNTER — Telehealth: Payer: Self-pay | Admitting: Internal Medicine

## 2019-06-29 DIAGNOSIS — F3289 Other specified depressive episodes: Secondary | ICD-10-CM

## 2019-06-29 NOTE — Progress Notes (Signed)
°  Chronic Care Management   Note  06/29/2019 Name: Alexis Grant MRN: [id number] DOB: Feb 25, 1950  Alexis Grant is a 68 y.o. year old female who is a primary care patient of Burns, Claudina Lick, MD. I reached out to Alexis Grant by phone today in response to a referral sent by [name] PCP, Binnie Rail, MD.   [name] was given information about Chronic Care Management services today including:  1. CCM service includes personalized support from designated clinical staff supervised by her physician, including individualized plan of care and coordination with other care providers 2. 24/7 contact phone numbers for assistance for urgent and routine care needs. 3. Service will only be billed when office clinical staff spend 20 minutes or more in a month to coordinate care. 4. Only one practitioner may furnish and bill the service in a calendar month. 5. The patient may stop CCM services at any time (effective at the end of the month) by phone call to the office staff.   Patient agreed to services and verbal consent obtained.  This note is not being shared with the patient for the following reason: To respect privacy (The patient or proxy has requested that the information not be shared).  Follow up plan:   Earney [facility] Scheduler

## 2019-07-24 ENCOUNTER — Other Ambulatory Visit: Payer: Self-pay | Admitting: Internal Medicine

## 2019-08-04 ENCOUNTER — Other Ambulatory Visit: Payer: Self-pay | Admitting: Internal Medicine

## 2019-08-11 ENCOUNTER — Other Ambulatory Visit: Payer: Self-pay

## 2019-08-11 ENCOUNTER — Emergency Department (HOSPITAL_COMMUNITY): Payer: Medicare Other

## 2019-08-11 ENCOUNTER — Encounter (HOSPITAL_COMMUNITY): Payer: Self-pay

## 2019-08-11 ENCOUNTER — Inpatient Hospital Stay (HOSPITAL_COMMUNITY)
Admission: EM | Admit: 2019-08-11 | Discharge: 2019-08-15 | DRG: 392 | Disposition: A | Discharge status: Home Health | Payer: Medicare Other | Attending: Family Medicine | Admitting: Family Medicine

## 2019-08-11 DIAGNOSIS — E669 Obesity, unspecified: Secondary | ICD-10-CM | POA: Diagnosis present

## 2019-08-11 DIAGNOSIS — Z833 Family history of diabetes mellitus: Secondary | ICD-10-CM

## 2019-08-11 DIAGNOSIS — E1151 Type 2 diabetes mellitus with diabetic peripheral angiopathy without gangrene: Secondary | ICD-10-CM | POA: Diagnosis not present

## 2019-08-11 DIAGNOSIS — G40309 Generalized idiopathic epilepsy and epileptic syndromes, not intractable, without status epilepticus: Secondary | ICD-10-CM | POA: Diagnosis present

## 2019-08-11 DIAGNOSIS — E860 Dehydration: Secondary | ICD-10-CM | POA: Diagnosis present

## 2019-08-11 DIAGNOSIS — Z90710 Acquired absence of both cervix and uterus: Secondary | ICD-10-CM

## 2019-08-11 DIAGNOSIS — E039 Hypothyroidism, unspecified: Secondary | ICD-10-CM | POA: Diagnosis not present

## 2019-08-11 DIAGNOSIS — E662 Morbid (severe) obesity with alveolar hypoventilation: Secondary | ICD-10-CM | POA: Diagnosis not present

## 2019-08-11 DIAGNOSIS — R197 Diarrhea, unspecified: Secondary | ICD-10-CM

## 2019-08-11 DIAGNOSIS — Z9049 Acquired absence of other specified parts of digestive tract: Secondary | ICD-10-CM

## 2019-08-11 DIAGNOSIS — R531 Weakness: Secondary | ICD-10-CM | POA: Diagnosis not present

## 2019-08-11 DIAGNOSIS — K219 Gastro-esophageal reflux disease without esophagitis: Secondary | ICD-10-CM | POA: Diagnosis present

## 2019-08-11 DIAGNOSIS — E876 Hypokalemia: Secondary | ICD-10-CM

## 2019-08-11 DIAGNOSIS — R600 Localized edema: Secondary | ICD-10-CM | POA: Diagnosis present

## 2019-08-11 DIAGNOSIS — Z79890 Hormone replacement therapy (postmenopausal): Secondary | ICD-10-CM

## 2019-08-11 DIAGNOSIS — R609 Edema, unspecified: Secondary | ICD-10-CM

## 2019-08-11 DIAGNOSIS — M797 Fibromyalgia: Secondary | ICD-10-CM | POA: Diagnosis not present

## 2019-08-11 DIAGNOSIS — K589 Irritable bowel syndrome without diarrhea: Secondary | ICD-10-CM | POA: Diagnosis present

## 2019-08-11 DIAGNOSIS — Z7984 Long term (current) use of oral hypoglycemic drugs: Secondary | ICD-10-CM

## 2019-08-11 DIAGNOSIS — Z841 Family history of disorders of kidney and ureter: Secondary | ICD-10-CM

## 2019-08-11 DIAGNOSIS — Z96653 Presence of artificial knee joint, bilateral: Secondary | ICD-10-CM | POA: Diagnosis present

## 2019-08-11 DIAGNOSIS — R5383 Other fatigue: Secondary | ICD-10-CM | POA: Diagnosis not present

## 2019-08-11 DIAGNOSIS — E1159 Type 2 diabetes mellitus with other circulatory complications: Secondary | ICD-10-CM | POA: Diagnosis present

## 2019-08-11 DIAGNOSIS — R0609 Other forms of dyspnea: Secondary | ICD-10-CM | POA: Diagnosis present

## 2019-08-11 DIAGNOSIS — Z6841 Body Mass Index (BMI) 40.0 and over, adult: Secondary | ICD-10-CM

## 2019-08-11 DIAGNOSIS — Z20822 Contact with and (suspected) exposure to covid-19: Secondary | ICD-10-CM | POA: Diagnosis present

## 2019-08-11 DIAGNOSIS — Z8371 Family history of colonic polyps: Secondary | ICD-10-CM

## 2019-08-11 DIAGNOSIS — I10 Essential (primary) hypertension: Secondary | ICD-10-CM | POA: Diagnosis present

## 2019-08-11 DIAGNOSIS — D649 Anemia, unspecified: Secondary | ICD-10-CM | POA: Diagnosis present

## 2019-08-11 DIAGNOSIS — Z800 Family history of malignant neoplasm of digestive organs: Secondary | ICD-10-CM

## 2019-08-11 DIAGNOSIS — R112 Nausea with vomiting, unspecified: Secondary | ICD-10-CM | POA: Diagnosis not present

## 2019-08-11 DIAGNOSIS — D1803 Hemangioma of intra-abdominal structures: Secondary | ICD-10-CM | POA: Diagnosis not present

## 2019-08-11 DIAGNOSIS — R05 Cough: Secondary | ICD-10-CM | POA: Diagnosis not present

## 2019-08-11 DIAGNOSIS — Z86010 Personal history of colonic polyps: Secondary | ICD-10-CM

## 2019-08-11 DIAGNOSIS — E785 Hyperlipidemia, unspecified: Secondary | ICD-10-CM | POA: Diagnosis not present

## 2019-08-11 DIAGNOSIS — Z79899 Other long term (current) drug therapy: Secondary | ICD-10-CM

## 2019-08-11 DIAGNOSIS — G40409 Other generalized epilepsy and epileptic syndromes, not intractable, without status epilepticus: Secondary | ICD-10-CM | POA: Diagnosis present

## 2019-08-11 DIAGNOSIS — K529 Noninfective gastroenteritis and colitis, unspecified: Principal | ICD-10-CM | POA: Diagnosis present

## 2019-08-11 DIAGNOSIS — E1169 Type 2 diabetes mellitus with other specified complication: Secondary | ICD-10-CM | POA: Diagnosis present

## 2019-08-11 DIAGNOSIS — Z8051 Family history of malignant neoplasm of kidney: Secondary | ICD-10-CM

## 2019-08-11 LAB — SARS CORONAVIRUS 2 BY RT PCR (HOSPITAL ORDER, PERFORMED IN ~~LOC~~ HOSPITAL LAB): SARS Coronavirus 2: NEGATIVE

## 2019-08-11 LAB — URINALYSIS, ROUTINE W REFLEX MICROSCOPIC
Bilirubin Urine: NEGATIVE
Glucose, UA: NEGATIVE mg/dL
Hgb urine dipstick: NEGATIVE
Ketones, ur: 20 mg/dL — AB
Leukocytes,Ua: NEGATIVE
Nitrite: NEGATIVE
Protein, ur: NEGATIVE mg/dL
Specific Gravity, Urine: >1.046 — ABNORMAL HIGH (ref 1.005–1.030)
pH: 6 (ref 5.0–8.0)

## 2019-08-11 LAB — C DIFFICILE QUICK SCREEN W PCR REFLEX
C Diff antigen: NEGATIVE
C Diff interpretation: NOT DETECTED
C Diff toxin: NEGATIVE

## 2019-08-11 LAB — COMPREHENSIVE METABOLIC PANEL
ALT: 9 U/L (ref 0–44)
AST: 11 U/L — ABNORMAL LOW (ref 15–41)
Albumin: 3.6 g/dL (ref 3.5–5.0)
Alkaline Phosphatase: 49 U/L (ref 38–126)
Anion gap: 9 (ref 5–15)
BUN: 8 mg/dL (ref 8–23)
CO2: 24 mmol/L (ref 22–32)
Calcium: 9 mg/dL (ref 8.9–10.3)
Chloride: 108 mmol/L (ref 98–111)
Creatinine, Ser: 0.62 mg/dL (ref 0.44–1.00)
GFR calc Af Amer: >60 mL/min (ref >60)
GFR calc non Af Amer: >60 mL/min (ref >60)
Glucose, Bld: 154 mg/dL — ABNORMAL HIGH (ref 70–99)
Potassium: 3.3 mmol/L — ABNORMAL LOW (ref 3.5–5.1)
Sodium: 141 mmol/L (ref 135–145)
Total Bilirubin: 0.4 mg/dL (ref 0.3–1.2)
Total Protein: 6.9 g/dL (ref 6.5–8.1)

## 2019-08-11 LAB — VALPROIC ACID LEVEL: Valproic Acid Lvl: 25 ug/mL — ABNORMAL LOW (ref 50.0–100.0)

## 2019-08-11 LAB — CBC WITH DIFFERENTIAL/PLATELET
Abs Immature Granulocytes: 0.08 10*3/uL — ABNORMAL HIGH (ref 0.00–0.07)
Basophils Absolute: 0 10*3/uL (ref 0.0–0.1)
Basophils Relative: 0 %
Eosinophils Absolute: 0.1 10*3/uL (ref 0.0–0.5)
Eosinophils Relative: 2 %
HCT: 39.7 % (ref 36.0–46.0)
Hemoglobin: 12.4 g/dL (ref 12.0–15.0)
Immature Granulocytes: 1 %
Lymphocytes Relative: 19 %
Lymphs Abs: 1.6 10*3/uL (ref 0.7–4.0)
MCH: 26.9 pg (ref 26.0–34.0)
MCHC: 31.2 g/dL (ref 30.0–36.0)
MCV: 86.1 fL (ref 80.0–100.0)
Monocytes Absolute: 0.6 10*3/uL (ref 0.1–1.0)
Monocytes Relative: 7 %
Neutro Abs: 5.8 10*3/uL (ref 1.7–7.7)
Neutrophils Relative %: 71 %
Platelets: 231 10*3/uL (ref 150–400)
RBC: 4.61 MIL/uL (ref 3.87–5.11)
RDW: 16.7 % — ABNORMAL HIGH (ref 11.5–15.5)
WBC: 8.2 10*3/uL (ref 4.0–10.5)
nRBC: 0 % (ref 0.0–0.2)

## 2019-08-11 LAB — LACTIC ACID, PLASMA: Lactic Acid, Venous: 1.3 mmol/L (ref 0.5–1.9)

## 2019-08-11 LAB — LIPASE, BLOOD: Lipase: 20 U/L (ref 11–51)

## 2019-08-11 LAB — MAGNESIUM: Magnesium: 2.2 mg/dL (ref 1.7–2.4)

## 2019-08-11 MED ORDER — IOHEXOL 300 MG/ML  SOLN
100.0000 mL | Freq: Once | INTRAMUSCULAR | Status: AC | PRN
  Administered 2019-08-11: 100 mL via INTRAVENOUS

## 2019-08-11 MED ORDER — ONDANSETRON HCL 4 MG/2ML IJ SOLN
4.0000 mg | Freq: Once | INTRAMUSCULAR | Status: AC
  Administered 2019-08-11: 4 mg via INTRAVENOUS
  Filled 2019-08-11: qty 2

## 2019-08-11 MED ORDER — SODIUM CHLORIDE (PF) 0.9 % IJ SOLN
INTRAMUSCULAR | Status: AC
  Filled 2019-08-11: qty 50

## 2019-08-11 MED ORDER — LOPERAMIDE HCL 2 MG PO CAPS
4.0000 mg | ORAL_CAPSULE | Freq: Once | ORAL | Status: AC
  Administered 2019-08-11: 4 mg via ORAL
  Filled 2019-08-11: qty 2

## 2019-08-11 MED ORDER — POTASSIUM CHLORIDE 10 MEQ/100ML IV SOLN
10.0000 meq | INTRAVENOUS | Status: AC
  Administered 2019-08-12 (×2): 10 meq via INTRAVENOUS
  Filled 2019-08-11 (×2): qty 100

## 2019-08-11 MED ORDER — ONDANSETRON 4 MG PO TBDP: 4.0000 mg | ORAL_TABLET | Freq: Three times a day (TID) | ORAL | 0 refills | Status: DC | PRN

## 2019-08-11 MED ORDER — INSULIN ASPART 100 UNIT/ML ~~LOC~~ SOLN
0.0000 [IU] | SUBCUTANEOUS | Status: DC
  Administered 2019-08-12 – 2019-08-15 (×6): 1 [IU] via SUBCUTANEOUS
  Filled 2019-08-11: qty 0.09

## 2019-08-11 MED ORDER — LOPERAMIDE HCL 2 MG PO CAPS
2.0000 mg | ORAL_CAPSULE | Freq: Four times a day (QID) | ORAL | 0 refills | Status: DC | PRN
Start: 2019-08-11 — End: 2021-08-17

## 2019-08-11 MED ORDER — POTASSIUM CHLORIDE CRYS ER 20 MEQ PO TBCR
20.0000 meq | EXTENDED_RELEASE_TABLET | Freq: Once | ORAL | Status: AC
  Administered 2019-08-11: 20 meq via ORAL
  Filled 2019-08-11: qty 1

## 2019-08-11 MED ORDER — SODIUM CHLORIDE 0.9 % IV BOLUS
1000.0000 mL | Freq: Once | INTRAVENOUS | Status: AC
  Administered 2019-08-11: 1000 mL via INTRAVENOUS

## 2019-08-11 MED ORDER — FENTANYL CITRATE (PF) 100 MCG/2ML IJ SOLN
50.0000 ug | Freq: Once | INTRAMUSCULAR | Status: AC
  Administered 2019-08-11: 50 ug via INTRAVENOUS
  Filled 2019-08-11: qty 2

## 2019-08-11 MED ORDER — SODIUM CHLORIDE 0.9 % IV SOLN: Freq: Once | INTRAVENOUS | Status: AC

## 2019-08-11 NOTE — ED Notes (Signed)
Pt given water and crackers, encouraged to drink/eat. Asked for urine sample, states she is unable to go

## 2019-08-11 NOTE — ED Notes (Signed)
Lab called, states they have urine sample and are running it now

## 2019-08-11 NOTE — H&P (Signed)
Alexis Grant:[id number] DOB: 1950/03/15 DOA: 08/11/2019     PCP: Binnie Rail, MD   Outpatient Specialists:     Patient arrived to ER on 08/11/19 at 1621 Referred by Attending Hayden Rasmussen, MD   Patient coming from: home Lives alone,      Chief Complaint:   Chief Complaint  Patient presents with  . Nausea  . Emesis  . Diarrhea    HPI: Alexis Grant is a 69 y.o. female with medical history significant of oSA on CPAP, HLD, DM2, HTN, seizure disorder, obesity, fibromyalgia, GERD    Presented with   nausea vomiting diarrhea since Monday for the past 6 days has not been able to tolerate her medications.  EMS gave 4 mg of Zofran on their arrival heart rate 69 blood glucose 159 blood pressure 133/92 reports dry cough and feeling hot she has been trying to drink Pedialyte no blood in stool no sick contacts patient is vaccinated for Covid Noted some leg swelling abdominal pain Noted weight gain up to 40 lb Severe leg swelling her PCP put her on Lasix fo r 3 days but it did not help with the swelling.  patient was told in the past she had fluid on her heart but that was long time ago Pt does not go out  Family brings her food, no hx of food poisoning  Infectious risk factors:  Reports  N/V/Diarrhea/abdominal pain,     Has   been vaccinated against COVID    Initial COVID TEST  NEGATIVE   Lab Results  Component Value Date   [place] NEGATIVE 08/11/2019     Regarding pertinent Chronic problems:     Hyperlipidemia -   on statins Zocor Lipid Panel     Component Value Date/Time   CHOL 158 06/01/2019 1152   TRIG 130.0 06/01/2019 1152   HDL 50.80 06/01/2019 1152   CHOLHDL 3 06/01/2019 1152   VLDL 26.0 06/01/2019 1152   LDLCALC 81 06/01/2019 1152   History of seizure disorder on Depakote  HTN on Cozaar    DM 2 -  Lab Results  Component Value Date   HGBA1C 5.9 06/01/2019   ,  PO meds only,     Hypothyroidism:  Lab Results  Component Value Date     TSH 4.17 06/01/2019   on synthroid   Morbid obesity-   BMI Readings from Last 1 Encounters:  08/11/19 53.14 kg/m      OSA -on nocturnal  CPAP      While in ER: Given IV fluids Zofran and fentanyl Otherwise normal white blood cell count chemistry and hemoglobin potassium slightly down to 3.3 C. difficile negative Covid was negative   Hospitalist was called for admission for dehydration, nausea/dairrhea  The following Work up has been ordered so far:  Orders Placed This Encounter  Procedures  . Culture, blood (routine x 2)  . Urine culture  . Gastrointestinal Panel by PCR , Stool  . C Difficile Quick Screen w PCR reflex  . SARS Coronavirus 2 by RT PCR (hospital order, performed in [facility] hospital lab) Nasopharyngeal Nasopharyngeal Swab  . DG Chest Port 1 View  . CT Abdomen Pelvis W Contrast  . Comprehensive metabolic panel  . Lipase, blood  . CBC with Differential  . Lactic acid, plasma  . Urinalysis, Routine w reflex microscopic  . Magnesium  . Valproic acid level  . Fluid/PO Challenge  . Consult to hospitalist  ALL PATIENTS BEING ADMITTED/HAVING  PROCEDURES NEED COVID-19 SCREENING  . Enteric precautions (UV disinfection) C difficile, Norovirus  . ED EKG    Following Medications were ordered in ER: Medications  sodium chloride (PF) 0.9 % injection (has no administration in time range)  0.9 %  sodium chloride infusion (has no administration in time range)  sodium chloride 0.9 % bolus 1,000 mL (0 mLs Intravenous Stopped 08/11/19 1900)  ondansetron (ZOFRAN) injection 4 mg (4 mg Intravenous Given 08/11/19 1732)  fentaNYL (SUBLIMAZE) injection 50 mcg (50 mcg Intravenous Given 08/11/19 1733)  iohexol (OMNIPAQUE) 300 MG/ML solution 100 mL (100 mLs Intravenous Contrast Given 08/11/19 1908)  loperamide (IMODIUM) capsule 4 mg (4 mg Oral Given 08/11/19 2014)  potassium chloride SA (KLOR-CON) CR tablet 20 mEq (20 mEq Oral Given 08/11/19 2014)        Consult Orders  (From  admission, onward)         Start     Ordered   08/11/19 2242  Consult to hospitalist  ALL PATIENTS BEING ADMITTED/HAVING PROCEDURES NEED COVID-19 SCREENING  Once       Comments: ALL PATIENTS BEING ADMITTED/HAVING PROCEDURES NEED COVID-19 SCREENING  Provider:  (Not yet assigned)  Question Answer Comment  Place call to: [facility]   Reason for Consult Admit      08/11/19 2241          Significant initial  Findings: Abnormal Labs Reviewed  COMPREHENSIVE METABOLIC PANEL - Abnormal; Notable for the following components:      Result Value   Potassium 3.3 (*)    Glucose, Bld 154 (*)    AST 11 (*)    All other components within normal limits  CBC WITH DIFFERENTIAL/PLATELET - Abnormal; Notable for the following components:   RDW 16.7 (*)    Abs Immature Granulocytes 0.08 (*)    All other components within normal limits  URINALYSIS, ROUTINE W REFLEX MICROSCOPIC - Abnormal; Notable for the following components:   Specific Gravity, Urine >1.046 (*)    Ketones, ur 20 (*)    All other components within normal limits  VALPROIC ACID LEVEL - Abnormal; Notable for the following components:   Valproic Acid Lvl 25 (*)    All other components within normal limits     Otherwise labs showing:    Recent Labs  Lab 08/11/19 1651  NA 141  K 3.3*  CO2 24  GLUCOSE 154*  BUN 8  CREATININE 0.62  CALCIUM 9.0  MG 2.2    Cr   stable,    Lab Results  Component Value Date   CREATININE 0.62 08/11/2019   CREATININE 0.54 06/01/2019   CREATININE 0.61 12/22/2018    Recent Labs  Lab 08/11/19 1651  AST 11*  ALT 9  ALKPHOS 49  BILITOT 0.4  PROT 6.9  ALBUMIN 3.6   Lab Results  Component Value Date   CALCIUM 9.0 08/11/2019   WBC      Component Value Date/Time   WBC 8.2 08/11/2019 1651   ANC    Component Value Date/Time   NEUTROABS 5.8 08/11/2019 1651   ALC No components found for: LYMPHAB    Plt: Lab Results  Component Value Date   PLT 231 08/11/2019    Lactic  Acid, Venous    Component Value Date/Time   LATICACIDVEN 1.3 08/11/2019 1651     COVID-19 Labs  No results for input(s): DDIMER, FERRITIN, LDH, CRP in the last 72 hours.  Lab Results  Component Value Date   [place] NEGATIVE 08/11/2019  HG/HCT   stable,       Component Value Date/Time   HGB 12.4 08/11/2019 1651   HCT 39.7 08/11/2019 1651    Recent Labs  Lab 08/11/19 1651  LIPASE 20   No results for input(s): AMMONIA in the last 168 hours.     ECG: Ordered Personally reviewed by me showing: HR : 62 Rhythm:  NSR,     no evidence of ischemic changes QTC 413     DM  labs:  HbA1C: Recent Labs    12/22/18 1315 06/01/19 1152  HGBA1C 5.9 5.9       CBG (last 3)  No results for input(s): GLUCAP in the last 72 hours.     UA  no evidence of UTI      Urine analysis:    Component Value Date/Time   COLORURINE YELLOW 08/11/2019 2128   APPEARANCEUR CLEAR 08/11/2019 2128   LABSPEC >1.046 (H) 08/11/2019 2128   PHURINE 6.0 08/11/2019 2128   GLUCOSEU NEGATIVE 08/11/2019 2128   HGBUR NEGATIVE 08/11/2019 2128   HGBUR trace-lysed 08/03/2007 1502   BILIRUBINUR NEGATIVE 08/11/2019 2128   BILIRUBINUR negative 03/03/2018 1022   KETONESUR 20 (A) 08/11/2019 2128   PROTEINUR NEGATIVE 08/11/2019 2128   UROBILINOGEN 0.2 03/03/2018 1022   UROBILINOGEN 0.2 11/10/2008 1807   NITRITE NEGATIVE 08/11/2019 2128   LEUKOCYTESUR NEGATIVE 08/11/2019 2128       Ordered   CXR - NON acute  CTabd/pelvis -  nonacute     ED Triage Vitals  Enc Vitals Group     BP 08/11/19 1647 128/69     Pulse Rate 08/11/19 1647 73     Resp 08/11/19 1647 18     Temp 08/11/19 1647 98.8 F (37.1 C)     Temp Source 08/11/19 1647 Oral     SpO2 08/11/19 1647 99 %     Weight 08/11/19 1653 (!) 300 lb (136.1 kg)     Height 08/11/19 1653 '5\' 3"'  (1.6 m)     Head Circumference --      Peak Flow --      Pain Score 08/11/19 1653 0     Pain Loc --      Pain Edu? --      Excl. in [place]? --     TMAX(24)@       Latest  Blood pressure (!) 145/67, pulse 66, temperature 98.8 F (37.1 C), temperature source Oral, resp. rate 17, height '5\' 3"'  (1.6 m), weight (!) 136.1 kg, SpO2 100 %.     Review of Systems:    Pertinent positives include:   chills, fatigue, weigh gain  Constitutional:  No weight loss, night sweats, Fevers, weight loss  HEENT:  No headaches, Difficulty swallowing,Tooth/dental problems,Sore throat,  No sneezing, itching, ear ache, nasal congestion, post nasal drip,  Cardio-vascular:  No chest pain, Orthopnea, PND, anasarca, dizziness, palpitations.no Bilateral lower extremity swelling  GI:  No heartburn, indigestion, abdominal pain, nausea, vomiting, diarrhea, change in bowel habits, loss of appetite, melena, blood in stool, hematemesis Resp:  no shortness of breath at rest. No dyspnea on exertion, No excess mucus, no productive cough, No non-productive cough, No coughing up of blood.No change in color of mucus.No wheezing. Skin:  no rash or lesions. No jaundice GU:  no dysuria, change in color of urine, no urgency or frequency. No straining to urinate.  No flank pain.  Musculoskeletal:  No joint pain or no joint swelling. No decreased range of motion. No back pain.  Psych:  No change in mood or affect. No depression or anxiety. No memory loss.  Neuro: no localizing neurological complaints, no tingling, no weakness, no double vision, no gait abnormality, no slurred speech, no confusion  All systems reviewed and apart from [place] all are negative  Past Medical History:   Past Medical History:  Diagnosis Date  . Adenomatous polyp of colon 2008  . Allergy   . Anemia   . Anxiety   . Arthritis   . Blood transfusion without reported diagnosis    following knee surgery   . Cataract    hx- removed both eyes  . Depression   . Diabetes mellitus    type II   . Dyslipidemia   . Fibromyalgia   . Gallstones   . Gastritis   . GERD (gastroesophageal reflux  disease)   . Hemangioma   . Hiatal hernia    Fibromyalgia  . Hyperlipidemia   . Hypertension   . Hypothyroidism 2010   Dr.[place]  . IBS (irritable bowel syndrome)    Dr Fuller Plan  . Migraines   . Myocardial infarct ([place]) 2010   subendocardrial, following R TKR  . Obesity   . OSA (obstructive sleep apnea)    cpap - does not know settings   . Pneumonia   . Seizure disorder ([place])   . Seizures ([place])    last seizure was 2002 per pt 07-29-16  . Sleep apnea   . Urinary tract infection       Past Surgical History:  Procedure Laterality Date  . ABDOMINAL HYSTERECTOMY  1988  . CARDIAC CATHETERIZATION  2007   DrGamble ( now seeing Dr Tonita Cong cardiology)  . CHOLECYSTECTOMY  1985  . COLONOSCOPY    . COLONOSCOPY WITH PROPOFOL N/A 07/16/2014   Procedure: COLONOSCOPY WITH PROPOFOL;  Surgeon: Ladene Artist, MD;  Location: [facility] ENDOSCOPY;  Service: Endoscopy;  Laterality: N/A;  . COLONOSCOPY WITH PROPOFOL N/A 09/21/2016   Procedure: COLONOSCOPY WITH PROPOFOL;  Surgeon: Ladene Artist, MD;  Location: [facility] ENDOSCOPY;  Service: Endoscopy;  Laterality: N/A;  . POLYPECTOMY  2012   6 or 7 adenomatous, Dr.Stark  . steroid injection to left si joint  12/2008   Dr.Newton   . TOTAL KNEE ARTHROPLASTY Right 11/2005  . TOTAL KNEE ARTHROPLASTY Left   . TUBAL LIGATION  1986    Social History:  Ambulatory   walker         reports that she has never smoked. She has never used smokeless tobacco. She reports that she does not drink alcohol and does not use drugs.    Family History:   Family History  Problem Relation Age of Onset  . Colon polyps Mother   . Ulcers Father   . Colon polyps Brother   . Diabetes Sister   . Kidney failure Sister   . Diabetes Paternal Aunt   . Esophageal cancer Brother   . Kidney cancer Sister   . Renal cancer Sister   . Colon cancer Neg Hx   . Heart attack Neg Hx   . Stroke Neg Hx   . Cancer Neg Hx   . Gallbladder disease Neg Hx   . Rectal cancer Neg Hx   .  Stomach cancer Neg Hx     Allergies: Allergies  Allergen Reactions  . Hydrocodone-Acetaminophen Itching    Itches all over; without rash per pt     Prior to Admission medications   Medication Sig Start Date End Date Taking? Authorizing Provider  acetaminophen (TYLENOL)  500 MG tablet Take 500 mg by mouth every 6 (six) hours as needed.   Yes [provider]  Acetaminophen-Codeine 300-30 MG tablet TAKE 1-2 TABLETS BY MOUTH 2 (TWO) TIMES DAILY AS NEEDED FOR MODERATE PAIN (CHRONIC HIP PAIN). Patient taking differently: Take 1-2 tablets by mouth 2 (two) times daily as needed for pain. TAKE 1-2 TABLETS BY MOUTH 2 (TWO) TIMES DAILY AS NEEDED FOR MODERATE PAIN (CHRONIC HIP PAIN). 07/24/19  Yes Burns, Claudina Lick, MD  blood glucose meter kit and supplies KIT Dispense based on patient and insurance preference. Use up to four times daily as directed. (FOR E11.9). 05/03/18  Yes Burns, Claudina Lick, MD  divalproex (DEPAKOTE) 500 MG DR tablet Take 1 tab twice a day Patient taking differently: Take 500 mg by mouth 2 (two) times daily. Take 1 tab twice a day 03/23/19  Yes Cameron Sprang, MD  gabapentin (NEURONTIN) 300 MG capsule Take 2 caps every night Patient taking differently: Take 600 mg by mouth at bedtime.  03/23/19  Yes Cameron Sprang, MD  glipiZIDE (GLUCOTROL) 10 MG tablet TAKE 1 TABLET (10 MG TOTAL) BY MOUTH 2 (TWO) TIMES DAILY BEFORE A MEAL. 05/29/19  Yes Burns, Claudina Lick, MD  levothyroxine (SYNTHROID) 75 MCG tablet TAKE 1 TABLET BY MOUTH ONCE DAILY BEFORE BREAKFAST Patient taking differently: Take 75 mcg by mouth daily before breakfast. Take 1 tablet by mouth once daily before breakfast. 06/06/19  Yes Burns, Claudina Lick, MD  losartan (COZAAR) 50 MG tablet TAKE 1 TABLET BY MOUTH EVERY DAY Patient taking differently: Take 50 mg by mouth daily.  05/29/19  Yes Burns, Claudina Lick, MD  metFORMIN (GLUCOPHAGE) 1000 MG tablet Take 1 tablet (1,000 mg total) by mouth 2 (two) times daily with a meal. 12/05/18  Yes Burns,  Claudina Lick, MD  omeprazole (PRILOSEC) 40 MG capsule TAKE 1 CAPSULE BY MOUTH EVERY DAY Patient taking differently: Take 40 mg by mouth daily.  08/06/19  Yes Burns, Claudina Lick, MD  oxybutynin (DITROPAN XL) 15 MG 24 hr tablet Take 1 tablet (15 mg total) by mouth at bedtime. 02/23/19  Yes Burns, Claudina Lick, MD  sertraline (ZOLOFT) 100 MG tablet Take 1.5 tablets (150 mg total) by mouth daily. 06/01/19  Yes Burns, Claudina Lick, MD  simvastatin (ZOCOR) 20 MG tablet TAKE 1 TABLET BY MOUTH EVERYDAY AT BEDTIME Patient taking differently: Take 20 mg by mouth daily at 6 PM.  06/22/19  Yes Burns, Claudina Lick, MD  topiramate (TOPAMAX) 100 MG tablet Take 1 tablet (100 mg total) by mouth 2 (two) times daily. 03/23/19  Yes Cameron Sprang, MD  traZODone (DESYREL) 50 MG tablet TAKE 2 TABLETS BY MOUTH AT BEDTIME FOR SLEEP Patient taking differently: Take 100 mg by mouth at bedtime. TAKE 2 TABLETS BY MOUTH AT BEDTIME FOR SLEEP 07/24/19  Yes Burns, Claudina Lick, MD  furosemide (LASIX) 20 MG tablet Take 1 tablet (20 mg total) by mouth daily. Patient not taking: Reported on 08/11/2019 06/04/19   Binnie Rail, MD  loperamide (IMODIUM) 2 MG capsule Take 1 capsule (2 mg total) by mouth 4 (four) times daily as needed for diarrhea or loose stools. 08/11/19   Hayden Rasmussen, MD  ondansetron (ZOFRAN-ODT) 4 MG disintegrating tablet Take 1 tablet (4 mg total) by mouth every 8 (eight) hours as needed for nausea or vomiting. 08/11/19   Hayden Rasmussen, MD   Physical Exam: Vitals with BMI 08/11/2019 08/11/2019 08/11/2019  Height - - -  Weight - - -  BMI - - -  Systolic 211 173 567  Diastolic 67 72 71  Pulse 66 65 64     1. General:  in No  Acute distress    Chronically ill -appearing 2. Psychological: Alert and  Oriented 3. Head/ENT:    Dry Mucous Membranes                          Head Non traumatic, neck supple                           Poor Dentition 4. SKIN:   decreased Skin turgor,  Skin clean Dry and intact no rash 5. Heart: Regular rate and  rhythm no  Murmur, no Rub or gallop 6. Lungs:  no wheezes or crackles   7. Abdomen: Soft,  non-tender, Non distended  bowel sounds present 8. Lower extremities: no clubbing, cyanosis, 2 + edema 9. Neurologically Grossly intact, moving all 4 extremities equally   10. MSK: Normal range of motion   All other LABS:     Recent Labs  Lab 08/11/19 1651  WBC 8.2  NEUTROABS 5.8  HGB 12.4  HCT 39.7  MCV 86.1  PLT 231     Recent Labs  Lab 08/11/19 1651  NA 141  K 3.3*  CL 108  CO2 24  GLUCOSE 154*  BUN 8  CREATININE 0.62  CALCIUM 9.0  MG 2.2     Recent Labs  Lab 08/11/19 1651  AST 11*  ALT 9  ALKPHOS 49  BILITOT 0.4  PROT 6.9  ALBUMIN 3.6       Cultures:    Component Value Date/Time   SDES  01/09/2018 1848    URINE, CLEAN CATCH Performed at [facility], [place] [address] [place], [place] [address]    SPECREQUEST  01/09/2018 1848    NONE Performed at [facility], [place] [address] [place], [place] [address]    CULT  01/09/2018 1848    Multiple bacterial morphotypes present, none predominant. Suggest appropriate recollection if clinically indicated.   REPTSTATUS 01/11/2018 FINAL 01/09/2018 1848     Radiological Exams on Admission: CT Abdomen Pelvis W Contrast  Result Date: 08/11/2019 CLINICAL DATA:  Nausea and vomiting. EXAM: CT ABDOMEN AND PELVIS WITH CONTRAST TECHNIQUE: Multidetector CT imaging of the abdomen and pelvis was performed using the standard protocol following bolus administration of intravenous contrast. CONTRAST:  144m OMNIPAQUE IOHEXOL 300 MG/ML  SOLN COMPARISON:  January 09, 2018. FINDINGS: Lower chest: There is stable pulmonary nodules at the right lung base.The heart size is mildly enlarged. Hepatobiliary: Again noted are multiple hepatic hemangiomas in the right hepatic lobe. Status post cholecystectomy.There is no biliary ductal dilation. Pancreas: Normal contours without ductal dilatation. No  peripancreatic fluid collection. Spleen: Unremarkable. Adrenals/Urinary Tract: --Adrenal glands: Unremarkable. --Right kidney/ureter: No hydronephrosis or radiopaque kidney stones. --Left kidney/ureter: No hydronephrosis or radiopaque kidney stones. --Urinary bladder: Unremarkable. Stomach/Bowel: --Stomach/Duodenum: No hiatal hernia or other gastric abnormality. Normal duodenal course and caliber. --Small bowel: Unremarkable. --Colon: Unremarkable. --Appendix: Normal. Vascular/Lymphatic: Atherosclerotic calcification is present within the non-aneurysmal abdominal aorta, without hemodynamically significant stenosis. --No retroperitoneal lymphadenopathy. --No mesenteric lymphadenopathy. --No pelvic or inguinal lymphadenopathy. Reproductive: Status post hysterectomy. No adnexal mass. Other: No ascites or free air. The abdominal wall is normal. Musculoskeletal. No acute displaced fractures. IMPRESSION: No acute abdominopelvic abnormality. Aortic Atherosclerosis (ICD10-I70.0). Electronically Signed   By: CJamie KatoD.  On: 08/11/2019 19:28   DG Chest Port 1 View  Result Date: 08/11/2019 CLINICAL DATA:  Cough with nausea, vomiting diarrhea 6 days. EXAM: PORTABLE CHEST 1 VIEW COMPARISON:  03/04/2013 FINDINGS: Lungs are adequately inflated without consolidation or effusion. Cardiomediastinal silhouette and remainder of the exam is unchanged. IMPRESSION: No active disease. Electronically Signed   By: Marin Olp M.D.   On: 08/11/2019 17:21    Chart has been reviewed  Assessment/Plan   69 y.o. female with medical history significant of oSA on CPAP, HLD, DM2, HTN, seizure disorder, obesity, fibromyalgia, GERD  Admitted for Nausea/[place] diarrhea/intravascular depletion but also 3rd spacing  Present on Admission:  Nausea vomiting diarrhea unclear etiology could be gastroenteritis obtain gastric panel and C. difficile CT abdomen unremarkable which is reassuring.  . Dehydration -versus intravascular  depletion patient with nausea vomiting decreased p.o. intake and diarrhea for the past 1 week.  If hemoconcentration and concentrated urine. Given third spacing will very gently rehydrate and follow fluid status  . Bilateral leg edema unclear etiology in the past patient was told that she has fluid around her heart her PCP has tried on Lasix which did not seem to help.  Will obtain echogram and leg Dopplers as her right lower extremity slightly more swollen than her left   Patient endorses dyspnea with exertion which is chronic but been getting worse as well as leg edema worsening and weight gain   . DM (diabetes mellitus), type 2 with peripheral vascular complications (HCC) - - Order Sensitive  SSI   -  check TSH and HgA1C  - Hold by mouth medications     . Dyslipidemia chronic stable continue home medications  . Essential hypertension for tonight allow permissive hypertension in the monitor resume home meds when able blood pressure has been a bit softer emergency department  . Fibromyalgia/neuropathy.  Hold Neurontin as this can increase leg edema may need to see if no other cause for lower extremity edema-trial off of gabapentin with help  . GERD (gastroesophageal reflux disease) chronic stable continue home medications  . Generalized convulsive epilepsy ([place]) patient off of Depakote for the past 2 days will restart and follow Depakote level nontoxic  . Hypothyroidism -chronic stable continue home medications  . Irritable bowel syndrome could be contributing to nausea vomiting diarrhea   Other plan as per orders.  DVT prophylaxis:  Lovenox       Code Status:    Code Status: Not on file FULL CODE   as per patient  I had personally discussed CODE STATUS with patient    Family Communication:   Family not at  Bedside  plan of care was discussed on the phone with   Daughter in law,   Disposition Plan:      To home once workup is complete and patient is stable   Following  barriers for discharge:                            Electrolytes corrected                               Anemia corrected                             Pain controlled with PO medications  Afebrile, white count improving able to transition to PO antibiotics                             Will need to be able to tolerate PO                            Will likely need home health, home O2, set up                           Will need consultants to evaluate patient prior to discharge   EXPECT DC tomorrow                    Would benefit from PT/OT eval prior to DC  Ordered                       Admission status:  ED Disposition    None      Obs     Level of care    tele  For 12H       Lab Results  Component Value Date   [place] 08/11/2019     Precautions: admitted as Covid Negative   PPE: Used by the provider:   N95  eye Goggles,  Gloves       Nkenge Sonntag 08/12/2019, 12:06 AM    [facility]     after 2 AM please page floor coverage PA If 7AM-7PM, please contact the day team taking care of the patient using [email or web address]   Patient was evaluated in the context of the global COVID-19 pandemic, which necessitated consideration that the patient might be at risk for infection with the SARS-CoV-2 virus that causes COVID-19. Institutional protocols and algorithms that pertain to the evaluation of patients at risk for COVID-19 are in a state of rapid change based on information released by regulatory bodies including the CDC and federal and state organizations. These policies and algorithms were followed during the patient's care.

## 2019-08-11 NOTE — ED Notes (Signed)
Pt assisted onto bedpan.

## 2019-08-11 NOTE — ED Notes (Signed)
Pt BIB EMS; Pt complaining of having N/V/D since Monday. Pt has not taken her medications in the past two days due to not being able to keep them down. EMS gave 4mg  IM Zofran.   BP-133/92 Temp-97.9 O2-98% HR-69 BG-159

## 2019-08-11 NOTE — ED Notes (Signed)
[phone] Daughter-in-law Lelan Pons

## 2019-08-11 NOTE — Discharge Instructions (Addendum)
You were seen in the emergency department for nausea diarrhea and generalized weakness.  He had lab work done that did not show any significant abnormalities.  Your CAT scan also did not show any abnormalities.  We are prescribing you nausea medication and diarrhea medication.  Please try to keep well-hydrated.  Contact your doctor for close follow-up.  Return to the emergency department for any worsening or concerning symptoms

## 2019-08-11 NOTE — ED Provider Notes (Signed)
[facility] DEPT Provider Note   CSN: [id number] Arrival date & time: 08/11/19  1621     History Chief Complaint  Patient presents with  . Nausea  . Emesis  . Diarrhea    Alexis Grant is a 69 y.o. female.  History of diabetes hypertension seizure normalized cardiac disease.  Complaining of 6 days of nausea vomiting diarrhea abdominal cramps feeling hot and cold dry cough.  Has been trying Pedialyte without any improvement.  Has not seen any blood from above or below.  No sick contacts or recent travel.  Had her Covid vaccine.  Also noticed increased swelling in her lower legs.  The history is provided by the patient.  Emesis Severity:  Moderate Duration:  6 days Timing:  Intermittent Quality:  Unable to specify Progression:  Unchanged Chronicity:  New Recent urination:  Normal Relieved by:  Nothing Worsened by:  Nothing Ineffective treatments:  None tried Associated symptoms: abdominal pain, chills, cough, diarrhea and fever   Associated symptoms: no headaches and no sore throat   Risk factors: diabetes   Diarrhea Quality:  Watery Severity:  Severe Onset quality:  Gradual Duration:  6 days Timing:  Intermittent Progression:  Unchanged Relieved by:  Nothing Worsened by:  Nothing Ineffective treatments:  Liquids Associated symptoms: abdominal pain, chills, cough, fever and vomiting   Associated symptoms: no headaches        Past Medical History:  Diagnosis Date  . Adenomatous polyp of colon 2008  . Allergy   . Anemia   . Anxiety   . Arthritis   . Blood transfusion without reported diagnosis    following knee surgery   . Cataract    hx- removed both eyes  . Depression   . Diabetes mellitus    type II   . Dyslipidemia   . Fibromyalgia   . Gallstones   . Gastritis   . GERD (gastroesophageal reflux disease)   . Hemangioma   . Hiatal hernia    Fibromyalgia  . Hyperlipidemia   . Hypertension   . Hypothyroidism 2010    Dr.[place]r  . IBS (irritable bowel syndrome)    Dr Fuller Plan  . Migraines   . Myocardial infarct ([place]) 2010   subendocardrial, following R TKR  . Obesity   . OSA (obstructive sleep apnea)    cpap - does not know settings   . Pneumonia   . Seizure disorder ([place])   . Seizures ([place])    last seizure was 2002 per pt 07-29-16  . Sleep apnea   . Urinary tract infection     Patient Active Problem List   Diagnosis Date Noted  . Bilateral leg edema 06/01/2019  . Overactive bladder 11/23/2018  . Frequent UTI 05/03/2018  . Lump of skin 12/30/2017  . Watery eyes 12/30/2017  . Polyneuropathy 09/27/2017  . Osteoarthritis, hip, bilateral 05/25/2017  . Osteopenia 11/22/2016  . GERD (gastroesophageal reflux disease) 11/15/2016  . Difficulty sleeping 05/24/2016  . Rosacea 11/25/2015  . Poor balance 11/25/2015  . Tremor 04/18/2015  . Other fatigue 02/17/2015  . Migraine with aura and without status migrainosus, not intractable 12/04/2014  . 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 Hx of adenomatous colonic polyps 07/16/2014  . Benign neoplasm of ascending colon 07/16/2014  . Benign neoplasm of descending colon 07/16/2014  . Benign neoplasm of cecum 07/16/2014  . Benign neoplasm of transverse colon 07/16/2014  .  Fibromyalgia 05/30/2013  . 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 Seasonal allergies 12/08/2011  . DM (diabetes mellitus), type 2 with peripheral vascular complications ([place]) 26/94/8546  . Essential hypertension 01/05/2010  . Irritable bowel syndrome 01/05/2010  . VERTIGO 09/16/2009  . VITAMIN D DEFICIENCY 02/24/2009  . MYOCARDIAL INFARCTION, ACUTE, SUBENDOCARDIAL 10/04/2008  . Hypothyroidism 03/19/2008  . COLONIC POLYPS, ADENOMATOUS, HX OF 08/07/2007  . HEMANGIOMA 04/26/2007  . Dyslipidemia 04/26/2007  . OBESITY 04/26/2007  .  Depression 04/26/2007  . SLEEP APNEA, OBSTRUCTIVE 04/26/2007  . HIATAL HERNIA 04/26/2007  . FATTY LIVER DISEASE 04/26/2007    Past Surgical History:  Procedure Laterality Date  . ABDOMINAL HYSTERECTOMY  1988  . CARDIAC CATHETERIZATION  2007   DrGamble ( now seeing Dr Tonita Cong cardiology)  . CHOLECYSTECTOMY  1985  . COLONOSCOPY    . COLONOSCOPY WITH PROPOFOL N/A 07/16/2014   Procedure: COLONOSCOPY WITH PROPOFOL;  Surgeon: Ladene Artist, MD;  Location: [facility];  Service: Endoscopy;  Laterality: N/A;  . COLONOSCOPY WITH PROPOFOL N/A 09/21/2016   Procedure: COLONOSCOPY WITH PROPOFOL;  Surgeon: Ladene Artist, MD;  Location: [facility];  Service: Endoscopy;  Laterality: N/A;  . POLYPECTOMY  2012   6 or 7 adenomatous, Dr.Stark  . steroid injection to left si joint  12/2008   Dr.Newton   . TOTAL KNEE ARTHROPLASTY Right 11/2005  . TOTAL KNEE ARTHROPLASTY Left   . TUBAL LIGATION  1986     OB History   No obstetric history on file.     Family History  Problem Relation Age of Onset  . Colon polyps Mother   . Ulcers Father   . Colon polyps Brother   . Diabetes Sister   . Kidney failure Sister   . Diabetes Paternal Aunt   . Esophageal cancer Brother   . Kidney cancer Sister   . Renal cancer Sister   . Colon cancer Neg Hx   . Heart attack Neg Hx   . Stroke Neg Hx   . Cancer Neg Hx   . Gallbladder disease Neg Hx   . Rectal cancer Neg Hx   . Stomach cancer Neg Hx     Social History   Tobacco Use  . Smoking status: Never Smoker  . Smokeless tobacco: Never Used  Vaping Use  . Vaping Use: Never used  Substance Use Topics  . Alcohol use: No    Alcohol/week: 0.0 standard drinks  . Drug use: No    Home Medications Prior to Admission medications   Medication Sig Start Date End Date Taking? Authorizing Provider  Acetaminophen-Codeine 300-30 MG tablet TAKE 1-2 TABLETS BY MOUTH 2 (TWO) TIMES DAILY AS NEEDED FOR MODERATE PAIN (CHRONIC HIP PAIN). 07/24/19   Binnie Rail, MD  traZODone (DESYREL) 50 MG tablet TAKE 2 TABLETS BY MOUTH AT BEDTIME FOR SLEEP 07/24/19   Binnie Rail, MD  acetaminophen (TYLENOL) 500 MG tablet Take 500 mg by mouth every 6 (six) hours as needed.    [provider]  blood glucose meter kit and supplies KIT Dispense based on patient and insurance preference. Use up to four times daily as directed. (FOR E11.9). 05/03/18   Binnie Rail, MD  divalproex (DEPAKOTE) 500 MG DR tablet Take 1 tab twice a day 03/23/19   Cameron Sprang, MD  furosemide (LASIX) 20 MG tablet Take 1 tablet (20 mg total) by mouth daily. 06/04/19   Binnie Rail, MD  gabapentin (NEURONTIN) 300 MG capsule Take 2 caps every  night 03/23/19   Cameron Sprang, MD  glipiZIDE (GLUCOTROL) 10 MG tablet TAKE 1 TABLET (10 MG TOTAL) BY MOUTH 2 (TWO) TIMES DAILY BEFORE A MEAL. 05/29/19   Binnie Rail, MD  levothyroxine (SYNTHROID) 75 MCG tablet TAKE 1 TABLET BY MOUTH ONCE DAILY BEFORE BREAKFAST 06/06/19   Binnie Rail, MD  losartan (COZAAR) 50 MG tablet TAKE 1 TABLET BY MOUTH EVERY DAY 05/29/19   Binnie Rail, MD  metFORMIN (GLUCOPHAGE) 1000 MG tablet Take 1 tablet (1,000 mg total) by mouth 2 (two) times daily with a meal. 12/05/18   Burns, Claudina Lick, MD  omeprazole (PRILOSEC) 40 MG capsule TAKE 1 CAPSULE BY MOUTH EVERY DAY 08/06/19   Binnie Rail, MD  ondansetron (ZOFRAN ODT) 4 MG disintegrating tablet Take 1 tablet (4 mg total) by mouth every 8 (eight) hours as needed for nausea or vomiting. 08/12/17   Charlann Lange, PA-C  oxybutynin (DITROPAN XL) 15 MG 24 hr tablet Take 1 tablet (15 mg total) by mouth at bedtime. 02/23/19   Binnie Rail, MD  sertraline (ZOLOFT) 100 MG tablet Take 1.5 tablets (150 mg total) by mouth daily. 06/01/19   Binnie Rail, MD  simvastatin (ZOCOR) 20 MG tablet TAKE 1 TABLET BY MOUTH EVERYDAY AT BEDTIME 06/22/19   Binnie Rail, MD  topiramate (TOPAMAX) 100 MG tablet Take 1 tablet (100 mg total) by mouth 2 (two) times daily. 03/23/19   Cameron Sprang, MD     Allergies    Hydrocodone-acetaminophen  Review of Systems   Review of Systems  Constitutional: Positive for chills, fatigue and fever.  HENT: Negative for sore throat.   Eyes: Negative for visual disturbance.  Respiratory: Positive for cough.   Cardiovascular: Positive for leg swelling. Negative for chest pain.  Gastrointestinal: Positive for abdominal pain, diarrhea and vomiting.  Genitourinary: Negative for dysuria.  Musculoskeletal: Negative for neck pain.  Skin: Negative for rash.  Neurological: Negative for headaches.    Physical Exam Updated Vital Signs BP 128/69 (BP Location: Left Arm)   Pulse 73   Temp 98.8 F (37.1 C) (Oral)   Resp 18   SpO2 99%   Physical Exam Vitals and nursing note reviewed.  Constitutional:      General: She is not in acute distress.    Appearance: She is well-developed. She is obese.  HENT:     Head: Normocephalic and atraumatic.  Eyes:     Conjunctiva/sclera: Conjunctivae normal.  Cardiovascular:     Rate and Rhythm: Normal rate and regular rhythm.     Pulses: Normal pulses.     Heart sounds: No murmur heard.   Pulmonary:     Effort: Pulmonary effort is normal. No respiratory distress.     Breath sounds: Normal breath sounds.  Abdominal:     Palpations: Abdomen is soft.     Tenderness: There is no abdominal tenderness.  Musculoskeletal:        General: No signs of injury. Normal range of motion.     Cervical back: Neck supple.     Right lower leg: Edema present.     Left lower leg: Edema present.  Skin:    General: Skin is warm and dry.     Capillary Refill: Capillary refill takes less than 2 seconds.  Neurological:     General: No focal deficit present.     Mental Status: She is alert.     ED Results / Procedures / Treatments   Labs (all labs  ordered are listed, but only abnormal results are displayed) Labs Reviewed  COMPREHENSIVE METABOLIC PANEL - Abnormal; Notable for the following components:      Result Value    Potassium 3.3 (*)    Glucose, Bld 154 (*)    AST 11 (*)    All other components within normal limits  CBC WITH DIFFERENTIAL/PLATELET - Abnormal; Notable for the following components:   RDW 16.7 (*)    Abs Immature Granulocytes 0.08 (*)    All other components within normal limits  URINALYSIS, ROUTINE W REFLEX MICROSCOPIC - Abnormal; Notable for the following components:   Specific Gravity, Urine >1.046 (*)    Ketones, ur 20 (*)    All other components within normal limits  VALPROIC ACID LEVEL - Abnormal; Notable for the following components:   Valproic Acid Lvl 25 (*)    All other components within normal limits  HEMOGLOBIN A1C - Abnormal; Notable for the following components:   Hgb A1c MFr Bld 5.8 (*)    All other components within normal limits  BRAIN NATRIURETIC PEPTIDE - Abnormal; Notable for the following components:   B Natriuretic Peptide 128.2 (*)    All other components within normal limits  D-DIMER, QUANTITATIVE (NOT AT [facility]) - Abnormal; Notable for the following components:   D-Dimer, Quant 0.83 (*)    All other components within normal limits  CBC WITH DIFFERENTIAL/PLATELET - Abnormal; Notable for the following components:   Hemoglobin 10.4 (*)    HCT 33.4 (*)    RDW 16.9 (*)    Abs Immature Granulocytes 0.08 (*)    All other components within normal limits  COMPREHENSIVE METABOLIC PANEL - Abnormal; Notable for the following components:   Glucose, Bld 109 (*)    BUN 6 (*)    Calcium 8.4 (*)    Total Protein 5.7 (*)    Albumin 3.0 (*)    AST 9 (*)    Alkaline Phosphatase 37 (*)    All other components within normal limits  CBG MONITORING, ED - Abnormal; Notable for the following components:   Glucose-Capillary 136 (*)    All other components within normal limits  CBG MONITORING, ED - Abnormal; Notable for the following components:   Glucose-Capillary 107 (*)    All other components within normal limits  CBG MONITORING, ED - Abnormal; Notable for the  following components:   Glucose-Capillary 106 (*)    All other components within normal limits  CULTURE, BLOOD (ROUTINE X 2)  CULTURE, BLOOD (ROUTINE X 2)  C DIFFICILE QUICK SCREEN W PCR REFLEX  SARS CORONAVIRUS 2 BY RT PCR (HOSPITAL ORDER, PERFORMED IN [facility])  URINE CULTURE  GASTROINTESTINAL PANEL BY PCR, STOOL (REPLACES STOOL CULTURE)  LIPASE, BLOOD  LACTIC ACID, PLASMA  MAGNESIUM  MAGNESIUM  PHOSPHORUS  TSH  LACTIC ACID, PLASMA  HIV ANTIBODY (ROUTINE TESTING W REFLEX)  TROPONIN I (HIGH SENSITIVITY)  TROPONIN I (HIGH SENSITIVITY)    EKG EKG Interpretation  Date/Time:  Saturday August 11 2019 17:11:21 EDT Ventricular Rate:  62 PR Interval:    QRS Duration: 96 QT Interval:  406 QTC Calculation: 413 R Axis:   6 Text Interpretation: Sinus rhythm Low voltage, precordial leads Borderline T abnormalities, anterior leads No significant change since prior 2/15 Confirmed by Aletta Edouard [phone]) on 08/11/2019 5:16:13 PM   Radiology CT Abdomen Pelvis W Contrast  Result Date: 08/11/2019 CLINICAL DATA:  Nausea and vomiting. EXAM: CT ABDOMEN AND PELVIS WITH CONTRAST TECHNIQUE: Multidetector CT imaging of  the abdomen and pelvis was performed using the standard protocol following bolus administration of intravenous contrast. CONTRAST:  125m OMNIPAQUE IOHEXOL 300 MG/ML  SOLN COMPARISON:  January 09, 2018. FINDINGS: Lower chest: There is stable pulmonary nodules at the right lung base.The heart size is mildly enlarged. Hepatobiliary: Again noted are multiple hepatic hemangiomas in the right hepatic lobe. Status post cholecystectomy.There is no biliary ductal dilation. Pancreas: Normal contours without ductal dilatation. No peripancreatic fluid collection. Spleen: Unremarkable. Adrenals/Urinary Tract: --Adrenal glands: Unremarkable. --Right kidney/ureter: No hydronephrosis or radiopaque kidney stones. --Left kidney/ureter: No hydronephrosis or radiopaque kidney stones.  --Urinary bladder: Unremarkable. Stomach/Bowel: --Stomach/Duodenum: No hiatal hernia or other gastric abnormality. Normal duodenal course and caliber. --Small bowel: Unremarkable. --Colon: Unremarkable. --Appendix: Normal. Vascular/Lymphatic: Atherosclerotic calcification is present within the non-aneurysmal abdominal aorta, without hemodynamically significant stenosis. --No retroperitoneal lymphadenopathy. --No mesenteric lymphadenopathy. --No pelvic or inguinal lymphadenopathy. Reproductive: Status post hysterectomy. No adnexal mass. Other: No ascites or free air. The abdominal wall is normal. Musculoskeletal. No acute displaced fractures. IMPRESSION: No acute abdominopelvic abnormality. Aortic Atherosclerosis (ICD10-I70.0). Electronically Signed   By: CConstance HolsterM.D.   On: 08/11/2019 19:28   DG Chest Port 1 View  Result Date: 08/11/2019 CLINICAL DATA:  Cough with nausea, vomiting diarrhea 6 days. EXAM: PORTABLE CHEST 1 VIEW COMPARISON:  03/04/2013 FINDINGS: Lungs are adequately inflated without consolidation or effusion. Cardiomediastinal silhouette and remainder of the exam is unchanged. IMPRESSION: No active disease. Electronically Signed   By: DMarin OlpM.D.   On: 08/11/2019 17:21   VAS [place]OWER EXTREMITY VENOUS (DVT)  Result Date: 08/12/2019  Lower Venous DVT Study Indications: Edema.  Risk Factors: None identified. Limitations: Poor ultrasound/tissue interface and body habitus. Comparison Study: No prior studies. Performing Technologist: GOliver HumRVT  Examination Guidelines: A complete evaluation includes B-mode imaging, spectral Doppler, color Doppler, and power Doppler as needed of all accessible portions of each vessel. Bilateral testing is considered an integral part of a complete examination. Limited examinations for reoccurring indications may be performed as noted. The reflux portion of the exam is performed with the patient in reverse Trendelenburg.   +---------+---------------+---------+-----------+----------+--------------+ RIGHT    CompressibilityPhasicitySpontaneityPropertiesThrombus Aging +---------+---------------+---------+-----------+----------+--------------+ CFV      Full           Yes      Yes                                 +---------+---------------+---------+-----------+----------+--------------+ SFJ      Full                                                        +---------+---------------+---------+-----------+----------+--------------+ FV Prox  Full                                                        +---------+---------------+---------+-----------+----------+--------------+ FV Mid   Full                                                        +---------+---------------+---------+-----------+----------+--------------+  FV Distal               Yes      Yes                                 +---------+---------------+---------+-----------+----------+--------------+ PFV      Full                                                        +---------+---------------+---------+-----------+----------+--------------+ POP      Full           Yes      Yes                                 +---------+---------------+---------+-----------+----------+--------------+ PTV      Full                                                        +---------+---------------+---------+-----------+----------+--------------+ PERO     Full                                                        +---------+---------------+---------+-----------+----------+--------------+   +---------+---------------+---------+-----------+----------+--------------+ LEFT     CompressibilityPhasicitySpontaneityPropertiesThrombus Aging +---------+---------------+---------+-----------+----------+--------------+ CFV      Full           Yes      Yes                                  +---------+---------------+---------+-----------+----------+--------------+ SFJ      Full                                                        +---------+---------------+---------+-----------+----------+--------------+ FV Prox  Full                                                        +---------+---------------+---------+-----------+----------+--------------+ FV Mid   Full                                                        +---------+---------------+---------+-----------+----------+--------------+ FV Distal               Yes      Yes                                 +---------+---------------+---------+-----------+----------+--------------+  PFV      Full                                                        +---------+---------------+---------+-----------+----------+--------------+ POP      Full           Yes      Yes                                 +---------+---------------+---------+-----------+----------+--------------+ PTV      Full                                                        +---------+---------------+---------+-----------+----------+--------------+ PERO     Full                                                        +---------+---------------+---------+-----------+----------+--------------+     Summary: RIGHT: - There is no evidence of deep vein thrombosis in the lower extremity. However, portions of this examination were limited- see technologist comments above.  - No cystic structure found in the popliteal fossa.  LEFT: - There is no evidence of deep vein thrombosis in the lower extremity. However, portions of this examination were limited- see technologist comments above.  - No cystic structure found in the popliteal fossa.  *See table(s) above for measurements and observations.    Preliminary     Procedures Procedures (including critical care time)  Medications Ordered in ED Medications  sodium chloride (PF) 0.9 % injection  (has no administration in time range)  insulin aspart (novoLOG) injection 0-9 Units (0 Units Subcutaneous Not Given 08/12/19 0409)  levothyroxine (SYNTHROID) tablet 75 mcg (has no administration in time range)  pantoprazole (PROTONIX) EC tablet 40 mg (has no administration in time range)  oxybutynin (DITROPAN XL) 24 hr tablet 15 mg (has no administration in time range)  sertraline (ZOLOFT) tablet 150 mg (has no administration in time range)  simvastatin (ZOCOR) tablet 20 mg (has no administration in time range)  topiramate (TOPAMAX) tablet 100 mg (100 mg Oral Given 08/12/19 0102)  traZODone (DESYREL) tablet 100 mg (100 mg Oral Given 08/12/19 0102)  divalproex (DEPAKOTE) DR tablet 500 mg (500 mg Oral Given 08/12/19 0102)  sodium chloride flush (NS) 0.9 % injection 3 mL (3 mLs Intravenous Given 08/12/19 0105)  docusate sodium (COLACE) capsule 100 mg (100 mg Oral Not Given 08/12/19 0104)  ondansetron (ZOFRAN) tablet 4 mg (has no administration in time range)    Or  ondansetron (ZOFRAN) injection 4 mg (has no administration in time range)  enoxaparin (LOVENOX) injection 60 mg (has no administration in time range)  0.9 %  sodium chloride infusion (has no administration in time range)  sodium chloride 0.9 % bolus 1,000 mL (0 mLs Intravenous Stopped 08/11/19 1900)  ondansetron (ZOFRAN) injection 4 mg (4 mg Intravenous Given 08/11/19 1732)  fentaNYL (SUBLIMAZE) injection 50 mcg (50 mcg Intravenous Given 08/11/19 1733)  iohexol (OMNIPAQUE) 300 MG/ML solution 100 mL (100 mLs Intravenous Contrast Given 08/11/19 1908)  loperamide (IMODIUM) capsule 4 mg (4 mg Oral Given 08/11/19 2014)  potassium chloride SA (KLOR-CON) CR tablet 20 mEq (20 mEq Oral Given 08/11/19 2014)  0.9 %  sodium chloride infusion ( Intravenous New Bag/Given 08/12/19 0014)  potassium chloride 10 mEq in 100 mL IVPB (0 mEq Intravenous Stopped 08/12/19 0300)    ED Course  I have reviewed the triage vital signs and the nursing notes.  Pertinent  labs & imaging results that were available during my care of the patient were reviewed by me and considered in my medical decision making (see chart for details).  Clinical Course as of Aug 11 940  Sat Aug 11, 2019  1800 Chest x-ray interpreted by me as no gross infiltrates no free air.   [MB]  1958 Patient's work-up is been fairly unremarkable.  Normal white count normal hemoglobin, chemistries fairly normal other than a low potassium of 3.3.  Lactate normal C. difficile negative Covid negative.  Will order her some Imodium and p.o. trial.   [MB]  2241 Patient states she is too weak to go home.  Lives alone.  Daughter-in-law says she cannot walk to the bathroom.  Put a patient to the hospitalist for admission.   [MB]    Clinical Course User Index [MB] Hayden Rasmussen, MD   MDM Rules/Calculators/A&P                         This patient complains of nausea vomiting diarrhea abdominal crampy pain dry cough; this involves an extensive number of treatment Options and is a complaint that carries with it a high risk of complications and Morbidity. The differential includes viral syndrome, dehydration, infectious diarrhea, colitis, obstruction, Covid  I ordered, reviewed and interpreted labs, which included CBC with normal white count stable hemoglobin, chemistries with fairly normal electrolytes, low calcium protein albumin, Covid testing negative, C. difficile negative, urinalysis concentrated no signs of infection lactate not elevated I ordered medication IV fluids pain and nausea medication I ordered imaging studies which included chest x-ray, CT abdomen and pelvis and I independently    visualized and interpreted imaging which showed no acute findings Additional history obtained from patient's daughter-in-law Previous records obtained and reviewed in epic, no recent admissions I consulted [facility] hospitalist Dr. Roel Cluck and discussed lab and imaging findings  Critical Interventions:  None  After the interventions stated above, I reevaluated the patient and found patient still to be symptomatically weak.  No significant derangements in her lab work.  Patient does not feel comfortable with discharge as she lives alone and cannot ambulate.  Will discuss with hospitalist  [name] was evaluated in Emergency Department on 08/11/2019 for the symptoms described in the history of present illness. She was evaluated in the context of the global COVID-19 pandemic, which necessitated consideration that the patient might be at risk for infection with the SARS-CoV-2 virus that causes COVID-19. Institutional protocols and algorithms that pertain to the evaluation of patients at risk for COVID-19 are in a state of rapid change based on information released by regulatory bodies including the CDC and federal and state organizations. These policies and algorithms were followed during the patient's care in the ED.  Final Clinical Impression(s) / ED Diagnoses Final diagnoses:  Diarrhea, unspecified type  Fatigue, unspecified type  Peripheral edema  Hypokalemia    Rx / DC Orders ED Discharge Orders  None       Hayden Rasmussen, MD 08/12/19 [phone]

## 2019-08-12 ENCOUNTER — Observation Stay (HOSPITAL_BASED_OUTPATIENT_CLINIC_OR_DEPARTMENT_OTHER): Payer: Medicare Other

## 2019-08-12 DIAGNOSIS — E860 Dehydration: Secondary | ICD-10-CM | POA: Diagnosis present

## 2019-08-12 DIAGNOSIS — E785 Hyperlipidemia, unspecified: Secondary | ICD-10-CM | POA: Diagnosis present

## 2019-08-12 DIAGNOSIS — K529 Noninfective gastroenteritis and colitis, unspecified: Secondary | ICD-10-CM | POA: Diagnosis present

## 2019-08-12 DIAGNOSIS — R197 Diarrhea, unspecified: Secondary | ICD-10-CM | POA: Diagnosis present

## 2019-08-12 DIAGNOSIS — E039 Hypothyroidism, unspecified: Secondary | ICD-10-CM | POA: Diagnosis present

## 2019-08-12 DIAGNOSIS — E1151 Type 2 diabetes mellitus with diabetic peripheral angiopathy without gangrene: Secondary | ICD-10-CM | POA: Diagnosis not present

## 2019-08-12 DIAGNOSIS — R0602 Shortness of breath: Secondary | ICD-10-CM | POA: Diagnosis not present

## 2019-08-12 DIAGNOSIS — Z6841 Body Mass Index (BMI) 40.0 and over, adult: Secondary | ICD-10-CM | POA: Diagnosis not present

## 2019-08-12 DIAGNOSIS — Z86010 Personal history of colonic polyps: Secondary | ICD-10-CM | POA: Diagnosis not present

## 2019-08-12 DIAGNOSIS — Z8051 Family history of malignant neoplasm of kidney: Secondary | ICD-10-CM | POA: Diagnosis not present

## 2019-08-12 DIAGNOSIS — G40409 Other generalized epilepsy and epileptic syndromes, not intractable, without status epilepticus: Secondary | ICD-10-CM | POA: Diagnosis present

## 2019-08-12 DIAGNOSIS — E669 Obesity, unspecified: Secondary | ICD-10-CM | POA: Diagnosis present

## 2019-08-12 DIAGNOSIS — R609 Edema, unspecified: Secondary | ICD-10-CM

## 2019-08-12 DIAGNOSIS — Z833 Family history of diabetes mellitus: Secondary | ICD-10-CM | POA: Diagnosis not present

## 2019-08-12 DIAGNOSIS — I10 Essential (primary) hypertension: Secondary | ICD-10-CM | POA: Diagnosis present

## 2019-08-12 DIAGNOSIS — Z9049 Acquired absence of other specified parts of digestive tract: Secondary | ICD-10-CM | POA: Diagnosis not present

## 2019-08-12 DIAGNOSIS — R112 Nausea with vomiting, unspecified: Secondary | ICD-10-CM | POA: Diagnosis not present

## 2019-08-12 DIAGNOSIS — Z800 Family history of malignant neoplasm of digestive organs: Secondary | ICD-10-CM | POA: Diagnosis not present

## 2019-08-12 DIAGNOSIS — K219 Gastro-esophageal reflux disease without esophagitis: Secondary | ICD-10-CM | POA: Diagnosis present

## 2019-08-12 DIAGNOSIS — Z841 Family history of disorders of kidney and ureter: Secondary | ICD-10-CM | POA: Diagnosis not present

## 2019-08-12 DIAGNOSIS — M797 Fibromyalgia: Secondary | ICD-10-CM | POA: Diagnosis present

## 2019-08-12 DIAGNOSIS — R0609 Other forms of dyspnea: Secondary | ICD-10-CM | POA: Diagnosis present

## 2019-08-12 DIAGNOSIS — Z96653 Presence of artificial knee joint, bilateral: Secondary | ICD-10-CM | POA: Diagnosis present

## 2019-08-12 DIAGNOSIS — E662 Morbid (severe) obesity with alveolar hypoventilation: Secondary | ICD-10-CM | POA: Diagnosis present

## 2019-08-12 DIAGNOSIS — Z20822 Contact with and (suspected) exposure to covid-19: Secondary | ICD-10-CM | POA: Diagnosis present

## 2019-08-12 DIAGNOSIS — K589 Irritable bowel syndrome without diarrhea: Secondary | ICD-10-CM | POA: Diagnosis present

## 2019-08-12 DIAGNOSIS — R600 Localized edema: Secondary | ICD-10-CM | POA: Diagnosis not present

## 2019-08-12 DIAGNOSIS — Z8371 Family history of colonic polyps: Secondary | ICD-10-CM | POA: Diagnosis not present

## 2019-08-12 LAB — ECHOCARDIOGRAM COMPLETE
Calc EF: 52.7 %
Height: 63 in
S' Lateral: 3.9 cm
Single Plane A2C EF: 56.1 %
Single Plane A4C EF: 49.2 %
Weight: 4800 oz

## 2019-08-12 LAB — TROPONIN I (HIGH SENSITIVITY)
Troponin I (High Sensitivity): 3 ng/L (ref <18)
Troponin I (High Sensitivity): 4 ng/L (ref <18)

## 2019-08-12 LAB — COMPREHENSIVE METABOLIC PANEL
ALT: 8 U/L (ref 0–44)
AST: 9 U/L — ABNORMAL LOW (ref 15–41)
Albumin: 3 g/dL — ABNORMAL LOW (ref 3.5–5.0)
Alkaline Phosphatase: 37 U/L — ABNORMAL LOW (ref 38–126)
Anion gap: 8 (ref 5–15)
BUN: 6 mg/dL — ABNORMAL LOW (ref 8–23)
CO2: 23 mmol/L (ref 22–32)
Calcium: 8.4 mg/dL — ABNORMAL LOW (ref 8.9–10.3)
Chloride: 109 mmol/L (ref 98–111)
Creatinine, Ser: 0.47 mg/dL (ref 0.44–1.00)
GFR calc Af Amer: >60 mL/min (ref >60)
GFR calc non Af Amer: >60 mL/min (ref >60)
Glucose, Bld: 109 mg/dL — ABNORMAL HIGH (ref 70–99)
Potassium: 3.6 mmol/L (ref 3.5–5.1)
Sodium: 140 mmol/L (ref 135–145)
Total Bilirubin: 0.3 mg/dL (ref 0.3–1.2)
Total Protein: 5.7 g/dL — ABNORMAL LOW (ref 6.5–8.1)

## 2019-08-12 LAB — CBC WITH DIFFERENTIAL/PLATELET
Abs Immature Granulocytes: 0.08 10*3/uL — ABNORMAL HIGH (ref 0.00–0.07)
Basophils Absolute: 0 10*3/uL (ref 0.0–0.1)
Basophils Relative: 0 %
Eosinophils Absolute: 0.1 10*3/uL (ref 0.0–0.5)
Eosinophils Relative: 1 %
HCT: 33.4 % — ABNORMAL LOW (ref 36.0–46.0)
Hemoglobin: 10.4 g/dL — ABNORMAL LOW (ref 12.0–15.0)
Immature Granulocytes: 1 %
Lymphocytes Relative: 28 %
Lymphs Abs: 2.5 10*3/uL (ref 0.7–4.0)
MCH: 26.7 pg (ref 26.0–34.0)
MCHC: 31.1 g/dL (ref 30.0–36.0)
MCV: 85.9 fL (ref 80.0–100.0)
Monocytes Absolute: 0.8 10*3/uL (ref 0.1–1.0)
Monocytes Relative: 9 %
Neutro Abs: 5.5 10*3/uL (ref 1.7–7.7)
Neutrophils Relative %: 61 %
Platelets: 177 10*3/uL (ref 150–400)
RBC: 3.89 MIL/uL (ref 3.87–5.11)
RDW: 16.9 % — ABNORMAL HIGH (ref 11.5–15.5)
WBC: 9 10*3/uL (ref 4.0–10.5)
nRBC: 0.2 % (ref 0.0–0.2)

## 2019-08-12 LAB — HEMOGLOBIN A1C
Hgb A1c MFr Bld: 5.8 % — ABNORMAL HIGH (ref 4.8–5.6)
Mean Plasma Glucose: 119.76 mg/dL

## 2019-08-12 LAB — GASTROINTESTINAL PANEL BY PCR, STOOL (REPLACES STOOL CULTURE)

## 2019-08-12 LAB — CBG MONITORING, ED
Glucose-Capillary: 106 mg/dL — ABNORMAL HIGH (ref 70–99)
Glucose-Capillary: 107 mg/dL — ABNORMAL HIGH (ref 70–99)
Glucose-Capillary: 118 mg/dL — ABNORMAL HIGH (ref 70–99)
Glucose-Capillary: 132 mg/dL — ABNORMAL HIGH (ref 70–99)
Glucose-Capillary: 136 mg/dL — ABNORMAL HIGH (ref 70–99)
Glucose-Capillary: 148 mg/dL — ABNORMAL HIGH (ref 70–99)

## 2019-08-12 LAB — BRAIN NATRIURETIC PEPTIDE: B Natriuretic Peptide: 128.2 pg/mL — ABNORMAL HIGH (ref 0.0–100.0)

## 2019-08-12 LAB — MAGNESIUM: Magnesium: 1.9 mg/dL (ref 1.7–2.4)

## 2019-08-12 LAB — D-DIMER, QUANTITATIVE: D-Dimer, Quant: 0.83 ug/mL-FEU — ABNORMAL HIGH (ref 0.00–0.50)

## 2019-08-12 LAB — TSH: TSH: 1.841 u[IU]/mL (ref 0.350–4.500)

## 2019-08-12 LAB — PHOSPHORUS: Phosphorus: 3.3 mg/dL (ref 2.5–4.6)

## 2019-08-12 LAB — HIV ANTIBODY (ROUTINE TESTING W REFLEX): HIV Screen 4th Generation wRfx: NONREACTIVE

## 2019-08-12 MED ORDER — SODIUM CHLORIDE 0.9 % IV SOLN: INTRAVENOUS | Status: AC

## 2019-08-12 MED ORDER — SERTRALINE HCL 50 MG PO TABS
150.0000 mg | ORAL_TABLET | Freq: Every day | ORAL | Status: DC
  Administered 2019-08-12 – 2019-08-15 (×4): 150 mg via ORAL
  Filled 2019-08-12: qty 3
  Filled 2019-08-12 (×3): qty 1

## 2019-08-12 MED ORDER — ACETAMINOPHEN-CODEINE 120-12 MG/5ML PO SOLN: 5.0000 mL | ORAL | Status: DC | PRN

## 2019-08-12 MED ORDER — PANTOPRAZOLE SODIUM 40 MG PO TBEC
40.0000 mg | DELAYED_RELEASE_TABLET | Freq: Every day | ORAL | Status: DC
  Administered 2019-08-12 – 2019-08-15 (×4): 40 mg via ORAL
  Filled 2019-08-12 (×4): qty 1

## 2019-08-12 MED ORDER — TOPIRAMATE 100 MG PO TABS
100.0000 mg | ORAL_TABLET | Freq: Two times a day (BID) | ORAL | Status: DC
  Administered 2019-08-12 – 2019-08-15 (×8): 100 mg via ORAL
  Filled 2019-08-12 (×8): qty 1

## 2019-08-12 MED ORDER — TRAZODONE HCL 100 MG PO TABS
100.0000 mg | ORAL_TABLET | Freq: Every day | ORAL | Status: DC
  Administered 2019-08-12 – 2019-08-14 (×4): 100 mg via ORAL
  Filled 2019-08-12 (×4): qty 1

## 2019-08-12 MED ORDER — ONDANSETRON HCL 4 MG PO TABS
4.0000 mg | ORAL_TABLET | Freq: Four times a day (QID) | ORAL | Status: DC | PRN
  Administered 2019-08-13 – 2019-08-14 (×2): 4 mg via ORAL
  Filled 2019-08-12 (×2): qty 1

## 2019-08-12 MED ORDER — KETOROLAC TROMETHAMINE 15 MG/ML IJ SOLN
15.0000 mg | Freq: Four times a day (QID) | INTRAMUSCULAR | Status: DC | PRN
  Administered 2019-08-13 – 2019-08-14 (×3): 15 mg via INTRAVENOUS
  Filled 2019-08-12 (×3): qty 1

## 2019-08-12 MED ORDER — PERFLUTREN LIPID MICROSPHERE
1.0000 mL | INTRAVENOUS | Status: AC | PRN
  Administered 2019-08-12: 2 mL via INTRAVENOUS
  Filled 2019-08-12: qty 10

## 2019-08-12 MED ORDER — ONDANSETRON HCL 4 MG/2ML IJ SOLN: 4.0000 mg | Freq: Four times a day (QID) | INTRAMUSCULAR | Status: DC | PRN

## 2019-08-12 MED ORDER — LEVOTHYROXINE SODIUM 75 MCG PO TABS
75.0000 ug | ORAL_TABLET | Freq: Every day | ORAL | Status: DC
  Administered 2019-08-12 – 2019-08-13 (×2): 75 ug via ORAL
  Filled 2019-08-12 (×2): qty 1

## 2019-08-12 MED ORDER — DOCUSATE SODIUM 100 MG PO CAPS
100.0000 mg | ORAL_CAPSULE | Freq: Two times a day (BID) | ORAL | Status: DC
  Administered 2019-08-12: 100 mg via ORAL
  Filled 2019-08-12 (×3): qty 1

## 2019-08-12 MED ORDER — OXYBUTYNIN CHLORIDE ER 5 MG PO TB24
15.0000 mg | ORAL_TABLET | Freq: Every day | ORAL | Status: DC
  Administered 2019-08-12 – 2019-08-14 (×3): 15 mg via ORAL
  Filled 2019-08-12: qty 3
  Filled 2019-08-12: qty 1
  Filled 2019-08-12: qty 3

## 2019-08-12 MED ORDER — ENOXAPARIN SODIUM 60 MG/0.6ML ~~LOC~~ SOLN
60.0000 mg | SUBCUTANEOUS | Status: DC
  Administered 2019-08-12 – 2019-08-15 (×4): 60 mg via SUBCUTANEOUS
  Filled 2019-08-12 (×6): qty 0.6

## 2019-08-12 MED ORDER — SODIUM CHLORIDE 0.9 % IV SOLN: INTRAVENOUS | Status: DC

## 2019-08-12 MED ORDER — SIMVASTATIN 20 MG PO TABS
20.0000 mg | ORAL_TABLET | Freq: Every day | ORAL | Status: DC
  Administered 2019-08-12 – 2019-08-14 (×3): 20 mg via ORAL
  Filled 2019-08-12 (×2): qty 1

## 2019-08-12 MED ORDER — SODIUM CHLORIDE 0.9% FLUSH
3.0000 mL | Freq: Two times a day (BID) | INTRAVENOUS | Status: DC
  Administered 2019-08-12 – 2019-08-14 (×3): 3 mL via INTRAVENOUS

## 2019-08-12 MED ORDER — DIVALPROEX SODIUM 250 MG PO DR TAB
500.0000 mg | DELAYED_RELEASE_TABLET | Freq: Two times a day (BID) | ORAL | Status: DC
  Administered 2019-08-12 – 2019-08-15 (×8): 500 mg via ORAL
  Filled 2019-08-12: qty 2
  Filled 2019-08-12: qty 1
  Filled 2019-08-12 (×4): qty 2
  Filled 2019-08-12 (×2): qty 1

## 2019-08-12 NOTE — ED Notes (Signed)
Pt repositioned in bed, c/o lower back pain from discomfort of stretcher

## 2019-08-12 NOTE — Progress Notes (Signed)
Bilateral lower extremity venous duplex has been completed. Preliminary results can be found in CV Proc through chart review.   08/12/19 9:00 AM Alexis Grant RVT

## 2019-08-12 NOTE — Progress Notes (Addendum)
  Echocardiogram 2D Echocardiogram has been performed with contrast.  Elmer Ramp 08/12/2019, 10:28 AM

## 2019-08-12 NOTE — Progress Notes (Signed)
PROGRESS NOTE    Alexis Grant  VOZ:[id number] DOB: February 03, 1950 DOA: 08/11/2019 PCP: Binnie Rail, MD   Brief Narrative: Alexis Grant is a 69 y.o. female with medical history significant  For OSA on CPAP, HLD, DM2, HTN, seizure disorder, obesity, fibromyalgia, GERD Who presents with nausea, vomiting, diarrhea since Monday for the past 6 days, she has not been able to tolerate her medications.  EMS gave 4 mg of Zofran on their arrival, heart rate 69, blood glucose 159, blood pressure 133/92, reports dry cough and feeling hot. she has been trying to drink Pedialyte,  no blood in stools,  no sick contacts,  patient is vaccinated for Covid.  Noted some leg swelling,  abdominal pain, Noted weight gain up to 40 lb. Severe leg swelling her PCP put her on Lasix fo r 3 days but it did not help with the swelling.  Patient is admitted for dehydration secondary to nausea vomiting and diarrhea.  CT abdomen is unremarkable, GI panel and C. difficile pending.   Assessment & Plan:   Active Problems:   Hypothyroidism   Dyslipidemia   Essential hypertension   Irritable bowel syndrome   DM (diabetes mellitus), type 2 with peripheral vascular complications (HCC)   Generalized convulsive epilepsy (HCC)   Fibromyalgia   GERD (gastroesophageal reflux disease)   Bilateral leg edema   Dehydration   Nausea, vomiting, diarrhea:  unclear etiology, this could be gastroenteritis,  obtain gastric panel and C. Difficile.  CT abdomen unremarkable which is reassuring.  . Dehydration -versus intravascular depletion patient with nausea vomiting decreased p.o. intake and diarrhea for the past 1 week.  If hemoconcentration and concentrated urine.Given third spacing will very gently rehydrate and follow fluid status  . Bilateral leg edema unclear etiology in the past patient was told that she has fluid around her heart,  her PCP has tried on Lasix which did not seem to help.  Will obtain echogram and leg Dopplers  as her right lower extremity slightly more swollen than her left    Patient endorses dyspnea with exertion which is chronic but been getting worse as well as leg edema worsening and weight gain.  Echocardiogram unremarkable, EF 55 to 60%.  Lower extremity renal duplex: no evidence of DVT.   . DM (diabetes mellitus), type 2 with peripheral vascular complications (HCC) - - Order Sensitive  SSI   -  check TSH and HgA1C  - Hold by mouth medications     . Dyslipidemia chronic, stable. continue home medications  . Essential hypertension : Resume home blood pressure medications.  . Fibromyalgia/neuropathy.  Hold Neurontin as this can increase leg edema may need to see if no other cause for lower extremity edema-trial off of gabapentin with help.  [name] GERD (gastroesophageal reflux disease) chronic stable. continue home medications  . Generalized convulsive epilepsy ([place]) patient off of Depakote for the past 2 days, will restart and follow Depakote level nontoxic  . Hypothyroidism -chronic stable. continue home medications  . Irritable bowel syndrome could be contributing to nausea vomiting diarrhea    DVT prophylaxis: Lovenox Code Status: Full Family Communication: Non one at bed side, d/w patient Disposition Plan: Anticipated discharge home in 1 to 2 days.  Patient is not medically clear due to generalized weakness. Consultants:   None  Procedures: None.  Antimicrobials: Anti-infectives (From admission, onward)   None      Subjective: Patient was seen and examined at bedside.  No overnight events.  Patient reports feeling  slightly better,  she still has some generalized weakness.  Objective: Vitals:   08/12/19 0832 08/12/19 1051 08/12/19 1200 08/12/19 1400  BP: (!) 115/54 (!) 127/63 (!) 120/58 (!) 129/66  Pulse: 90 66 61 58  Resp: 18 (!) 26 (!) 24 (!) 27  Temp:      TempSrc:      SpO2: 98% 97% 94% 95%  Weight:      Height:        Intake/Output Summary (Last  24 hours) at 08/12/2019 1441 Last data filed at 08/12/2019 0300 Gross per 24 hour  Intake 1100 ml  Output --  Net 1100 ml   Filed Weights   08/11/19 1653  Weight: (!) 136.1 kg    Examination:  General exam: Appears calm and comfortable  Respiratory system: Clear to auscultation. Respiratory effort normal. Cardiovascular system: S1 & S2 heard, RRR. No JVD, murmurs, rubs, gallops or clicks. No pedal edema. Gastrointestinal system: Abdomen is nondistended, soft and nontender. No organomegaly or masses felt. Normal bowel sounds heard. Central nervous system: Alert and oriented. No focal neurological deficits. Extremities: Symmetric 5 x 5 power. Skin: No rashes, lesions or ulcers Psychiatry: Judgement and insight appear normal. Mood & affect appropriate.     Data Reviewed: I have personally reviewed following labs and imaging studies  CBC: Recent Labs  Lab 08/11/19 1651 08/12/19 0355  WBC 8.2 9.0  NEUTROABS 5.8 5.5  HGB 12.4 10.4*  HCT 39.7 33.4*  MCV 86.1 85.9  PLT 231 979   Basic Metabolic Panel: Recent Labs  Lab 08/11/19 1651 08/12/19 0355  NA 141 140  K 3.3* 3.6  CL 108 109  CO2 24 23  GLUCOSE 154* 109*  BUN 8 6*  CREATININE 0.62 0.47  CALCIUM 9.0 8.4*  MG 2.2 1.9  PHOS  --  3.3   GFR: Estimated Creatinine Clearance: 91.3 mL/min (by C-G formula based on SCr of 0.47 mg/dL). Liver Function Tests: Recent Labs  Lab 08/11/19 1651 08/12/19 0355  AST 11* 9*  ALT 9 8  ALKPHOS 49 37*  BILITOT 0.4 0.3  PROT 6.9 5.7*  ALBUMIN 3.6 3.0*   Recent Labs  Lab 08/11/19 1651  LIPASE 20   No results for input(s): AMMONIA in the last 168 hours. Coagulation Profile: No results for input(s): INR, PROTIME in the last 168 hours. Cardiac Enzymes: No results for input(s): CKTOTAL, CKMB, CKMBINDEX, TROPONINI in the last 168 hours. BNP (last 3 results) Recent Labs    06/01/19 1152  PROBNP 110.0*   HbA1C: Recent Labs    08/12/19 0012  HGBA1C 5.8*    CBG: Recent Labs  Lab 08/12/19 0010 08/12/19 0400 08/12/19 0905 08/12/19 1407  GLUCAP 136* 107* 106* 148*   Lipid Profile: No results for input(s): CHOL, HDL, LDLCALC, TRIG, CHOLHDL, LDLDIRECT in the last 72 hours. Thyroid Function Tests: Recent Labs    08/12/19 0355  TSH 1.841   Anemia Panel: No results for input(s): VITAMINB12, FOLATE, FERRITIN, TIBC, IRON, RETICCTPCT in the last 72 hours. Sepsis Labs: Recent Labs  Lab 08/11/19 1651  LATICACIDVEN 1.3    Recent Results (from the past 240 hour(s))  Culture, blood (routine x 2)     Status: None (Preliminary result)   Collection Time: 08/11/19  4:51 PM   Specimen: BLOOD  Result Value Ref Range Status   Specimen Description   Final    BLOOD RIGHT ANTECUBITAL Performed at [facility] [address] [place], [place] [address]    Special Requests  Final    BOTTLES DRAWN AEROBIC AND ANAEROBIC Blood Culture results may not be optimal due to an excessive volume of blood received in culture bottles Performed at [facility] [address], [place], [place] [address]    Culture   Final    NO GROWTH < 12 HOURS Performed at [facility] [address] [place], [place] [address]    Report Status PENDING  Incomplete  C Difficile Quick Screen w PCR reflex     Status: None   Collection Time: 08/11/19  4:53 PM   Specimen: Stool  Result Value Ref Range Status   C Diff antigen NEGATIVE NEGATIVE Final   C Diff toxin NEGATIVE NEGATIVE Final   C Diff interpretation No C. difficile detected.  Final    Comment: Performed at [facility], [place] [address], [place], [place] [address]  Culture, blood (routine x 2)     Status: None (Preliminary result)   Collection Time: 08/11/19  4:56 PM   Specimen: BLOOD  Result Value Ref Range Status   Specimen Description   Final    BLOOD LEFT ANTECUBITAL Performed at [facility] [address]  [place], [place] [address]    Special Requests   Final    BOTTLES DRAWN AEROBIC AND ANAEROBIC Blood Culture results may not be optimal due to an excessive volume of blood received in culture bottles Performed at [facility] [address] [place], [place] [address]    Culture   Final    NO GROWTH < 12 HOURS Performed at [facility] [address] [place], [place] [address]    Report Status PENDING  Incomplete  SARS Coronavirus 2 by RT PCR (hospital order, performed in [facility] hospital lab) Nasopharyngeal Nasopharyngeal Swab     Status: None   Collection Time: 08/11/19  5:01 PM   Specimen: Nasopharyngeal Swab  Result Value Ref Range Status   SARS Coronavirus 2 NEGATIVE NEGATIVE Final    Comment: (NOTE) SARS-CoV-2 target nucleic acids are NOT DETECTED.  The SARS-CoV-2 RNA is generally detectable in upper and lower respiratory specimens during the acute phase of infection. The lowest concentration of SARS-CoV-2 viral copies this assay can detect is 250 copies / mL. A negative result does not preclude SARS-CoV-2 infection and should not be used as the sole basis for treatment or other patient management decisions.  A negative result may occur with improper specimen collection / handling, submission of specimen other than nasopharyngeal swab, presence of viral mutation(s) within the areas targeted by this assay, and inadequate number of viral copies (<250 copies / mL). A negative result must be combined with clinical observations, patient history, and epidemiological information.  Fact Sheet for Patients:   StrictlyIdeas.no  Fact Sheet for Healthcare Providers: BankingDealers.co.za  This test is not yet approved or  cleared by the [place] FDA and has been authorized for detection and/or diagnosis of SARS-CoV-2 by FDA under an Emergency Use Authorization (EUA).  This EUA will remain in effect (meaning  this test can be used) for the duration of the COVID-19 declaration under Section 564(b)(1) of the Act, 21 U.S.C. section 360bbb-3(b)(1), unless the authorization is terminated or revoked sooner.  Performed at [facility], [place] [address] [place], [place] [address]          Radiology Studies: CT Abdomen Pelvis W Contrast  Result Date: 08/11/2019 CLINICAL DATA:  Nausea and vomiting. EXAM: CT ABDOMEN  AND PELVIS WITH CONTRAST TECHNIQUE: Multidetector CT imaging of the abdomen and pelvis was performed using the standard protocol following bolus administration of intravenous contrast. CONTRAST:  171mL OMNIPAQUE IOHEXOL 300 MG/ML  SOLN COMPARISON:  January 09, 2018. FINDINGS: Lower chest: There is stable pulmonary nodules at the right lung base.The heart size is mildly enlarged. Hepatobiliary: Again noted are multiple hepatic hemangiomas in the right hepatic lobe. Status post cholecystectomy.There is no biliary ductal dilation. Pancreas: Normal contours without ductal dilatation. No peripancreatic fluid collection. Spleen: Unremarkable. Adrenals/Urinary Tract: --Adrenal glands: Unremarkable. --Right kidney/ureter: No hydronephrosis or radiopaque kidney stones. --Left kidney/ureter: No hydronephrosis or radiopaque kidney stones. --Urinary bladder: Unremarkable. Stomach/Bowel: --Stomach/Duodenum: No hiatal hernia or other gastric abnormality. Normal duodenal course and caliber. --Small bowel: Unremarkable. --Colon: Unremarkable. --Appendix: Normal. Vascular/Lymphatic: Atherosclerotic calcification is present within the non-aneurysmal abdominal aorta, without hemodynamically significant stenosis. --No retroperitoneal lymphadenopathy. --No mesenteric lymphadenopathy. --No pelvic or inguinal lymphadenopathy. Reproductive: Status post hysterectomy. No adnexal mass. Other: No ascites or free air. The abdominal wall is normal. Musculoskeletal. No acute displaced fractures. IMPRESSION: No  acute abdominopelvic abnormality. Aortic Atherosclerosis (ICD10-I70.0). Electronically Signed   By: Constance Holster M.D.   On: 08/11/2019 19:28   DG Chest Port 1 View  Result Date: 08/11/2019 CLINICAL DATA:  Cough with nausea, vomiting diarrhea 6 days. EXAM: PORTABLE CHEST 1 VIEW COMPARISON:  03/04/2013 FINDINGS: Lungs are adequately inflated without consolidation or effusion. Cardiomediastinal silhouette and remainder of the exam is unchanged. IMPRESSION: No active disease. Electronically Signed   By: Marin Olp M.D.   On: 08/11/2019 17:21   ECHOCARDIOGRAM COMPLETE  Result Date: 08/12/2019    ECHOCARDIOGRAM REPORT   Patient Name:   AIANA NORDQUIST Date of Exam: 08/12/2019 Medical Rec #:  [id number]       Height:       63.0 in Accession #:    [id number]      Weight:       300.0 lb Date of Birth:  1950/09/13      BSA:          2.297 m Patient Age:    89 years        BP:           115/54 mmHg Patient Gender: F               HR:           62 bpm. Exam Location:  Inpatient Procedure: 2D Echo, Cardiac Doppler, Color Doppler and Intracardiac            Opacification Agent Indications:    R06.02 SOB  History:        Patient has prior history of Echocardiogram examinations, most                 recent 04/20/2013.  Sonographer:    Merrie Roof RDCS Referring Phys: [place]  1. Left ventricular ejection fraction, by estimation, is 55 to 60%. The left ventricle has normal function. The left ventricle has no regional wall motion abnormalities. Left ventricular diastolic parameters were normal.  2. Right ventricular systolic function is normal. The right ventricular size is normal. Tricuspid regurgitation signal is inadequate for assessing PA pressure.  3. The mitral valve is grossly normal. Trivial mitral valve regurgitation.  4. The aortic valve is tricuspid. Aortic valve regurgitation is not visualized.  5. The inferior vena cava is dilated in size with <50% respiratory variability,  suggesting right atrial pressure of 15 mmHg. FINDINGS  Left Ventricle: Left ventricular ejection fraction, by estimation, is 55 to 60%. The left ventricle has normal function. The left ventricle has no regional wall motion abnormalities. Definity contrast agent was given IV to delineate the left ventricular  endocardial borders. The left ventricular internal cavity size was normal in size. There is no left ventricular hypertrophy. Left ventricular diastolic parameters were normal. Right Ventricle: The right ventricular size is normal. No increase in right ventricular wall thickness. Right ventricular systolic function is normal. Tricuspid regurgitation signal is inadequate for assessing PA pressure. Left Atrium: Left atrial size was normal in size. Right Atrium: Right atrial size was normal in size. Pericardium: Trivial pericardial effusion is present. The pericardial effusion is posterior to the left ventricle. Mitral Valve: The mitral valve is grossly normal. Trivial mitral valve regurgitation. Tricuspid Valve: The tricuspid valve is grossly normal. Tricuspid valve regurgitation is not demonstrated. Aortic Valve: The aortic valve is tricuspid. Aortic valve regurgitation is not visualized. Pulmonic Valve: The pulmonic valve was normal in structure. Pulmonic valve regurgitation is not visualized. Aorta: The aortic root and ascending aorta are structurally normal, with no evidence of dilitation. Venous: The inferior vena cava is dilated in size with less than 50% respiratory variability, suggesting right atrial pressure of 15 mmHg. IAS/Shunts: No atrial level shunt detected by color flow Doppler.  LEFT VENTRICLE PLAX 2D LVIDd:         5.40 cm      Diastology LVIDs:         3.90 cm      LV e' lateral: 9.57 cm/s LV PW:         0.90 cm      LV e' medial:  8.16 cm/s LV IVS:        0.90 cm LVOT diam:     2.00 cm LVOT Area:     3.14 cm  LV Volumes (MOD) LV vol d, MOD A2C: 125.0 ml LV vol d, MOD A4C: 145.0 ml LV vol s, MOD  A2C: 54.9 ml LV vol s, MOD A4C: 73.7 ml LV SV MOD A2C:     70.1 ml LV SV MOD A4C:     145.0 ml LV SV MOD BP:      70.7 ml RIGHT VENTRICLE             IVC RV Basal diam:  3.10 cm     IVC diam: 2.20 cm RV S prime:     13.90 cm/s TAPSE (M-mode): 2.5 cm LEFT ATRIUM              Index LA diam:        4.10 cm  1.78 cm/m LA Vol (A2C):   100.0 ml 43.53 ml/m LA Vol (A4C):   72.9 ml  31.73 ml/m LA Biplane Vol: 84.9 ml  36.96 ml/m   AORTA Ao Root diam: 2.80 cm Ao Asc diam:  3.10 cm  SHUNTS Systemic Diam: 2.00 cm Lyman Bishop MD Electronically signed by Lyman Bishop MD Signature Date/Time: 08/12/2019/12:55:50 PM    Final    VAS [place] LOWER EXTREMITY VENOUS (DVT)  Result Date: 08/12/2019  Lower Venous DVT Study Indications: Edema.  Risk Factors: None identified. Limitations: Poor ultrasound/tissue interface and body habitus. Comparison Study: No prior studies. Performing Technologist: Oliver Hum RVT  Examination Guidelines: A complete evaluation includes B-mode imaging, spectral Doppler, color Doppler, and power Doppler as needed of all accessible portions of each vessel. Bilateral testing is considered an integral part of a complete examination. Limited examinations  for reoccurring indications may be performed as noted. The reflux portion of the exam is performed with the patient in reverse Trendelenburg.  +---------+---------------+---------+-----------+----------+--------------+ RIGHT    CompressibilityPhasicitySpontaneityPropertiesThrombus Aging +---------+---------------+---------+-----------+----------+--------------+ CFV      Full           Yes      Yes                                 +---------+---------------+---------+-----------+----------+--------------+ SFJ      Full                                                        +---------+---------------+---------+-----------+----------+--------------+ FV Prox  Full                                                         +---------+---------------+---------+-----------+----------+--------------+ FV Mid   Full                                                        +---------+---------------+---------+-----------+----------+--------------+ FV Distal               Yes      Yes                                 +---------+---------------+---------+-----------+----------+--------------+ PFV      Full                                                        +---------+---------------+---------+-----------+----------+--------------+ POP      Full           Yes      Yes                                 +---------+---------------+---------+-----------+----------+--------------+ PTV      Full                                                        +---------+---------------+---------+-----------+----------+--------------+ PERO     Full                                                        +---------+---------------+---------+-----------+----------+--------------+   +---------+---------------+---------+-----------+----------+--------------+ LEFT     CompressibilityPhasicitySpontaneityPropertiesThrombus Aging +---------+---------------+---------+-----------+----------+--------------+ CFV      Full  Yes      Yes                                 +---------+---------------+---------+-----------+----------+--------------+ SFJ      Full                                                        +---------+---------------+---------+-----------+----------+--------------+ FV Prox  Full                                                        +---------+---------------+---------+-----------+----------+--------------+ FV Mid   Full                                                        +---------+---------------+---------+-----------+----------+--------------+ FV Distal               Yes      Yes                                  +---------+---------------+---------+-----------+----------+--------------+ PFV      Full                                                        +---------+---------------+---------+-----------+----------+--------------+ POP      Full           Yes      Yes                                 +---------+---------------+---------+-----------+----------+--------------+ PTV      Full                                                        +---------+---------------+---------+-----------+----------+--------------+ PERO     Full                                                        +---------+---------------+---------+-----------+----------+--------------+     Summary: RIGHT: - There is no evidence of deep vein thrombosis in the lower extremity. However, portions of this examination were limited- see technologist comments above.  - No cystic structure found in the popliteal fossa.  LEFT: - There is no evidence of deep vein thrombosis in the lower extremity. However, portions of this examination were limited- see technologist comments above.  - No cystic structure found in the  popliteal fossa.  *See table(s) above for measurements and observations. Electronically signed by Monica Martinez MD on 08/12/2019 at 11:38:46 AM.    Final     Scheduled Meds: . divalproex  500 mg Oral BID  . docusate sodium  100 mg Oral BID  . enoxaparin (LOVENOX) injection  60 mg Subcutaneous Q24H  . insulin aspart  0-9 Units Subcutaneous Q4H  . levothyroxine  75 mcg Oral QAC breakfast  . oxybutynin  15 mg Oral QHS  . pantoprazole  40 mg Oral Daily  . sertraline  150 mg Oral Daily  . simvastatin  20 mg Oral q1800  . sodium chloride flush  3 mL Intravenous Q12H  . topiramate  100 mg Oral BID  . traZODone  100 mg Oral QHS   Continuous Infusions: . sodium chloride       LOS: 0 days    Time spent: 25 mins.    Shawna Clamp, MD [facility]   If 7PM-7AM, please contact night-coverage

## 2019-08-12 NOTE — ED Notes (Signed)
Patient daughter-in-law- Lelon Perla to speak to provider about patients lab results and scan results. Dr. Sidney Ace speaking to family member currently.

## 2019-08-12 NOTE — ED Notes (Signed)
Linen/chux changed, pt repositioned in bed, purewick in place

## 2019-08-12 NOTE — ED Notes (Signed)
Pt moved to IP bed

## 2019-08-13 LAB — CBC
HCT: 34.1 % — ABNORMAL LOW (ref 36.0–46.0)
Hemoglobin: 10.7 g/dL — ABNORMAL LOW (ref 12.0–15.0)
MCH: 26.8 pg (ref 26.0–34.0)
MCHC: 31.4 g/dL (ref 30.0–36.0)
MCV: 85.5 fL (ref 80.0–100.0)
Platelets: 161 10*3/uL (ref 150–400)
RBC: 3.99 MIL/uL (ref 3.87–5.11)
RDW: 16.6 % — ABNORMAL HIGH (ref 11.5–15.5)
WBC: 8.5 10*3/uL (ref 4.0–10.5)
nRBC: 0.2 % (ref 0.0–0.2)

## 2019-08-13 LAB — BASIC METABOLIC PANEL
Anion gap: 8 (ref 5–15)
BUN: 6 mg/dL — ABNORMAL LOW (ref 8–23)
CO2: 23 mmol/L (ref 22–32)
Calcium: 8.4 mg/dL — ABNORMAL LOW (ref 8.9–10.3)
Chloride: 106 mmol/L (ref 98–111)
Creatinine, Ser: 0.5 mg/dL (ref 0.44–1.00)
GFR calc Af Amer: >60 mL/min (ref >60)
GFR calc non Af Amer: >60 mL/min (ref >60)
Glucose, Bld: 113 mg/dL — ABNORMAL HIGH (ref 70–99)
Potassium: 3.3 mmol/L — ABNORMAL LOW (ref 3.5–5.1)
Sodium: 137 mmol/L (ref 135–145)

## 2019-08-13 LAB — URINE CULTURE: Culture: NO GROWTH

## 2019-08-13 LAB — CBG MONITORING, ED
Glucose-Capillary: 100 mg/dL — ABNORMAL HIGH (ref 70–99)
Glucose-Capillary: 107 mg/dL — ABNORMAL HIGH (ref 70–99)
Glucose-Capillary: 118 mg/dL — ABNORMAL HIGH (ref 70–99)

## 2019-08-13 LAB — MAGNESIUM: Magnesium: 1.9 mg/dL (ref 1.7–2.4)

## 2019-08-13 LAB — GLUCOSE, CAPILLARY
Glucose-Capillary: 116 mg/dL — ABNORMAL HIGH (ref 70–99)
Glucose-Capillary: 126 mg/dL — ABNORMAL HIGH (ref 70–99)
Glucose-Capillary: 106 mg/dL — ABNORMAL HIGH (ref 70–99)

## 2019-08-13 MED ORDER — ENSURE MAX PROTEIN PO LIQD
11.0000 [oz_av] | Freq: Every day | ORAL | Status: DC
  Administered 2019-08-13 – 2019-08-15 (×2): 11 [oz_av] via ORAL

## 2019-08-13 MED ORDER — LEVOTHYROXINE SODIUM 75 MCG PO TABS
75.0000 ug | ORAL_TABLET | Freq: Every day | ORAL | Status: DC
  Administered 2019-08-14 – 2019-08-15 (×2): 75 ug via ORAL
  Filled 2019-08-13 (×2): qty 1

## 2019-08-13 NOTE — Progress Notes (Signed)
Initial Nutrition Assessment  DOCUMENTATION CODES:   Morbid obesity  INTERVENTION:   -Ensure MAX Protein daily, each supplement provides 150 kcal and 30 grams of protein  NUTRITION DIAGNOSIS:   Inadequate oral intake related to nausea, vomiting, diarrhea as evidenced by per patient/family report, meal completion < 25%.  GOAL:   Patient will meet greater than or equal to 90% of their needs  MONITOR:   PO intake, Supplement acceptance, Labs, Weight trends, I & O's  REASON FOR ASSESSMENT:   Consult Assessment of nutrition requirement/status  ASSESSMENT:   69 y.o. female with medical history significant  For OSA on CPAP, HLD, DM2, HTN, seizure disorder, obesity, fibromyalgia, GERDWho presents with nausea, vomiting, diarrhea since Monday for the past 6 days, she has not been able to tolerate her medications.  Patient currently sleeping during RD visit. Was not able to gather information at this time. Noted containers of jello at bedside.   Per chart review, pt has had N/V and diarrhea starting 7 days ago. Pt eating very little at this time. Continues to have diarrhea. Will order Ensure Max for protein.  Per weight records, pt has gained weight. No weight loss noted.  Medications: Colace Labs reviewed: CBGs: 107-116 Low K  NUTRITION - FOCUSED PHYSICAL EXAM:  No depletions noted.  Diet Order:   Diet Order            Diet Carb Modified Fluid consistency: Thin; Room service appropriate? Yes  Diet effective now                 EDUCATION NEEDS:   No education needs have been identified at this time  Skin:  Skin Assessment: Reviewed RN Assessment  Last BM:  PTA  Height:   Ht Readings from Last 1 Encounters:  08/11/19 5\' 3"  (1.6 m)    Weight:   Wt Readings from Last 1 Encounters:  08/11/19 (!) 136.1 kg    BMI:  Body mass index is 53.14 kg/m.  Estimated Nutritional Needs:   Kcal:  1850-2050  Protein:  105-120g  Fluid:  2L/day  Clayton Bibles,  MS, RD, LDN Inpatient Clinical Dietitian Contact information available via Amion

## 2019-08-13 NOTE — Progress Notes (Signed)
Patient family has concerns about patient returning home. Requesting home health or assistance. TOC consult placed. Patient family verbalized understanding.

## 2019-08-13 NOTE — Evaluation (Signed)
Physical Therapy Evaluation Patient Details Name: Alexis Grant MRN: [id number] DOB: 12-Apr-1950 Today's Date: 08/13/2019   History of Present Illness  Alexis Grant is a 69 y.o. female with medical history significant of oSA on CPAP, HLD, DM2, HTN, seizure disorder, obesity, fibromyalgia, GERD  Presented with   nausea vomiting diarrhea since Monday for the past 6 days has not been able to tolerate her medications.  Clinical Impression  Pt admitted with above diagnosis. Pt deconditioned, limited with ambulation distance using RW to steady self. Pt admits to some difficulty with self care tasks at home and limited ambulation due to fatiguing prior to admission. Pt with some dizziness once seated on toilet that resolves with time and cold wash cloth. Pt reports mobility during eval is about her typical, but is agreeable to [facility] therapy to improve strength and endurance with mobility at home. Pt currently with functional limitations due to the deficits listed below (see PT Problem List). Pt will benefit from skilled PT to increase their independence and safety with mobility to allow discharge to the venue listed below.       Follow Up Recommendations Home health PT;Supervision - Intermittent    Equipment Recommendations  Other (comment) (tub bench, bariatric rollator)    Recommendations for Other Services       Precautions / Restrictions Precautions Precautions: Fall Restrictions Weight Bearing Restrictions: No      Mobility  Bed Mobility Overal bed mobility: Needs Assistance Bed Mobility: Supine to Sit  Supine to sit: Min guard;HOB elevated  General bed mobility comments: heavy use of rails and elevated HOB to upright trunk and come to sitting EOB, no physical assist  Transfers Overall transfer level: Needs assistance Equipment used: Rolling walker (2 wheeled) Transfers: Sit to/from [facility] Sit to Stand: Min assist Stand pivot transfers: Min assist  General  transfer comment: min A to power up with UE assisting by pushing on bed and pulling on grab bar in bathroom  Ambulation/Gait Ambulation/Gait assistance: Min guard Gait Distance (Feet): 20 Feet Assistive device: Rolling walker (2 wheeled) Gait Pattern/deviations: Step-to pattern;Decreased stride length Gait velocity: decreased   General Gait Details: slow, short, slightly labored steps with RW steadying pt, no overt loss of balance or near falls, limited in distance secondary to fatigue  Stairs            Wheelchair Mobility    Modified Rankin (Stroke Patients Only)       Balance Overall balance assessment: Needs assistance Sitting-balance support: Feet supported;No upper extremity supported Sitting balance-Leahy Scale: Good Sitting balance - Comments: seated EOB   Standing balance support: During functional activity;Bilateral upper extremity supported Standing balance-Leahy Scale: Poor Standing balance comment: reliant on RW for steadying        Pertinent Vitals/Pain Pain Assessment: 0-10 Pain Score: 7  Pain Location: hip (L>R) Pain Descriptors / Indicators: Sharp;Aching Pain Intervention(s): Limited activity within patient's tolerance;Monitored during session    [place] expects to be discharged to:: Private residence Living Arrangements: Alone Available Help at Discharge: Family;Available PRN/intermittently Type of Home: House Home Access: Stairs to enter Entrance Stairs-Rails: Right;Left;Can reach both Entrance Stairs-Number of Steps: 3 Home Layout: One level Home Equipment: Shower seat;Cane - single point      Prior Function Level of Independence: Independent with assistive device(s)         Comments: Pt reports using SPC in community, independent with household ambulation. Pt reports independent with ADLs, but some difficulty performing household chores. Pt drives  limited distances. Pt reports 2 sons, who check on her, provide  groceries, pick up medications and occaisonally cooking. Pt reports some near misses, but no falls. Pt sleeps in recliner due to back/hip pain.     Hand Dominance   Dominant Hand: Right    Extremity/Trunk Assessment   Upper Extremity Assessment Upper Extremity Assessment: Defer to OT evaluation    Lower Extremity Assessment Lower Extremity Assessment: Overall WFL for tasks assessed (AROM WNL, strength 3+/5, numbness in tops of feet, edema noted throughout BLE)    Cervical / Trunk Assessment Cervical / Trunk Assessment: Normal  Communication   Communication: No difficulties  Cognition Arousal/Alertness: Lethargic Behavior During Therapy: WFL for tasks assessed/performed Overall Cognitive Status: Within Functional Limits for tasks assessed  General Comments: States she sometimes has difficulty with memory; did not sleep last night      General Comments General comments (skin integrity, edema, etc.): BP 127/62 and SpO2 98% on RA seated EOB without complaints    Exercises     Assessment/Plan    PT Assessment Patient needs continued PT services  PT Problem List Decreased strength;Decreased activity tolerance;Decreased balance;Decreased knowledge of use of DME;Cardiopulmonary status limiting activity;Pain       PT Treatment Interventions DME instruction;Gait training;Stair training;Functional mobility training;Therapeutic activities;Therapeutic exercise;Balance training;Neuromuscular re-education;Patient/family education    PT Goals (Current goals can be found in the Care Plan section)  Acute Rehab PT Goals Patient Stated Goal: to get better and go home to her puppies PT Goal Formulation: With patient Time For Goal Achievement: 08/20/19 Potential to Achieve Goals: Good    Frequency Min 3X/week   Barriers to discharge        Co-evaluation PT/OT/SLP Co-Evaluation/Treatment: Yes Reason for Co-Treatment: For patient/therapist safety;To address functional/ADL  transfers PT goals addressed during session: Mobility/safety with mobility;Balance;Proper use of DME OT goals addressed during session: ADL's and self-care       AM-PAC PT "6 Clicks" Mobility  Outcome Measure Help needed turning from your back to your side while in a flat bed without using bedrails?: A Little Help needed moving from lying on your back to sitting on the side of a flat bed without using bedrails?: A Lot Help needed moving to and from a bed to a chair (including a wheelchair)?: A Little Help needed standing up from a chair using your arms (e.g., wheelchair or bedside chair)?: A Little Help needed to walk in hospital room?: A Little Help needed climbing 3-5 steps with a railing? : A Lot 6 Click Score: 16    End of Session Equipment Utilized During Treatment: Gait belt Activity Tolerance: Patient tolerated treatment well;Patient limited by fatigue Patient left: in chair;with call bell/phone within reach;with chair alarm set Nurse Communication: Mobility status PT Visit Diagnosis: Other abnormalities of gait and mobility (R26.89);Muscle weakness (generalized) (M62.81)    Time: 8676-7209 PT Time Calculation (min) (ACUTE ONLY): 38 min   Charges:   PT Evaluation $PT Eval Moderate Complexity: 1 Mod          Tori Ciela Mahajan PT, DPT 08/13/19, 3:08 PM

## 2019-08-13 NOTE — Progress Notes (Signed)
PROGRESS NOTE    KADIE BALESTRIERI  LPF:[id number] DOB: 06-24-1950 DOA: 08/11/2019 PCP: Binnie Rail, MD   Brief Narrative: MAEDELL HEDGER is a 69 y.o. female with medical history significant  For OSA on CPAP, HLD, DM2, HTN, seizure disorder, obesity, fibromyalgia, GERD Who presents with nausea, vomiting, diarrhea since Monday for the past 6 days, she has not been able to tolerate her medications.  EMS gave 4 mg of Zofran on their arrival, heart rate 69, blood glucose 159, blood pressure 133/92, reports dry cough and feeling hot. she has been trying to drink Pedialyte,  no blood in stools,  no sick contacts,  patient is vaccinated for Covid.  Noted some leg swelling,  abdominal pain, Noted weight gain up to 40 lb. Severe leg swelling her PCP put her on Lasix fo r 3 days but it did not help with the swelling.  Patient is admitted for dehydration secondary to nausea, vomiting and diarrhea.  CT abdomen is unremarkable, GI panel and C. difficile negative.  Blood cultures urine culture negative.   Assessment & Plan:   Active Problems:   Hypothyroidism   Dyslipidemia   Essential hypertension   Irritable bowel syndrome   DM (diabetes mellitus), type 2 with peripheral vascular complications (HCC)   Generalized convulsive epilepsy (HCC)   Fibromyalgia   GERD (gastroesophageal reflux disease)   Bilateral leg edema   Dehydration   Nausea, vomiting, diarrhea: Improved unclear etiology, this could be gastroenteritis, GI panel and C. difficile negative. CT abdomen unremarkable which is reassuring.  Dehydration -versus intravascular depletion patient with nausea vomiting decreased p.o. intake and diarrhea for the past 1 week.  If hemoconcentration and concentrated urine.Given third spacing will very gently rehydrate and follow fluid status  . Bilateral leg edema unclear etiology in the past patient was told that she has fluid around her heart,  her PCP has tried on Lasix which did not seem to  help.  Will obtain echogram and leg Dopplers as her right lower extremity slightly more swollen than her left    Patient endorses dyspnea with exertion which is chronic but been getting worse as well as leg edema worsening and weight gain.  Echocardiogram unremarkable, EF 55 to 60%.  Lower extremity renal duplex: no evidence of DVT.   . DM (diabetes mellitus), type 2 with peripheral vascular complications (HCC) - - Order Sensitive  SSI   -  check TSH and HgA1C  - Hold by mouth medications     . Dyslipidemia chronic, stable. continue home medications  . Essential hypertension : Resume home blood pressure medications.  . Fibromyalgia/neuropathy.  Hold Neurontin as this can increase leg edema may need to see if no other cause for lower extremity edema-trial off of gabapentin with help.  [name] GERD (gastroesophageal reflux disease) chronic stable. continue home medications  . Generalized convulsive epilepsy ([place]) patient off of Depakote for the past 2 days, will restart and follow Depakote level nontoxic  . Hypothyroidism -chronic stable. continue home medications  . Irritable bowel syndrome could be contributing to nausea vomiting diarrhea    DVT prophylaxis: Lovenox Code Status: Full Family Communication: Non one at bed side, d/w patient Disposition Plan: Anticipated discharge home in 1 to 2 days.  Patient is not medically clear due to generalized weakness. Consultants:   None  Procedures: None.  Antimicrobials: Anti-infectives (From admission, onward)   None      Subjective: Patient was seen and examined at bedside.  No overnight events.  Patient reports nausea and vomiting is resolved still has some diarrhea but improving. Objective: Vitals:   08/13/19 0400 08/13/19 0604 08/13/19 0840 08/13/19 1224  BP: (!) 138/63 (!) 133/62 (!) 106/92 (!) 148/66  Pulse: 62 66 58 55  Resp: 23 22 19 19   Temp:  99.8 F (37.7 C) 99 F (37.2 C) 98.7 F (37.1 C)  TempSrc:  Oral  Oral Oral  SpO2: 98% 96% 97% 97%  Weight:      Height:        Intake/Output Summary (Last 24 hours) at 08/13/2019 1605 Last data filed at 08/13/2019 1500 Gross per 24 hour  Intake 1452.61 ml  Output --  Net 1452.61 ml   Filed Weights   08/11/19 1653  Weight: (!) 136.1 kg    Examination:  General exam: Appears calm and comfortable  Respiratory system: Clear to auscultation. Respiratory effort normal. Cardiovascular system: S1 & S2 heard, RRR. No JVD, murmurs, rubs, gallops or clicks. No pedal edema. Gastrointestinal system: Abdomen is nondistended, soft and nontender. No organomegaly or masses felt. Normal bowel sounds heard. Central nervous system: Alert and oriented. No focal neurological deficits. Extremities: Symmetric 5 x 5 power. Skin: No rashes, lesions or ulcers Psychiatry: Judgement and insight appear normal. Mood & affect appropriate.     Data Reviewed: I have personally reviewed following labs and imaging studies  CBC: Recent Labs  Lab 08/11/19 1651 08/12/19 0355 08/13/19 0600  WBC 8.2 9.0 8.5  NEUTROABS 5.8 5.5  --   HGB 12.4 10.4* 10.7*  HCT 39.7 33.4* 34.1*  MCV 86.1 85.9 85.5  PLT 231 177 710   Basic Metabolic Panel: Recent Labs  Lab 08/11/19 1651 08/12/19 0355 08/13/19 0600  NA 141 140 137  K 3.3* 3.6 3.3*  CL 108 109 106  CO2 24 23 23   GLUCOSE 154* 109* 113*  BUN 8 6* 6*  CREATININE 0.62 0.47 0.50  CALCIUM 9.0 8.4* 8.4*  MG 2.2 1.9 1.9  PHOS  --  3.3  --    GFR: Estimated Creatinine Clearance: 91.3 mL/min (by C-G formula based on SCr of 0.5 mg/dL). Liver Function Tests: Recent Labs  Lab 08/11/19 1651 08/12/19 0355  AST 11* 9*  ALT 9 8  ALKPHOS 49 37*  BILITOT 0.4 0.3  PROT 6.9 5.7*  ALBUMIN 3.6 3.0*   Recent Labs  Lab 08/11/19 1651  LIPASE 20   No results for input(s): AMMONIA in the last 168 hours. Coagulation Profile: No results for input(s): INR, PROTIME in the last 168 hours. Cardiac Enzymes: No results for  input(s): CKTOTAL, CKMB, CKMBINDEX, TROPONINI in the last 168 hours. BNP (last 3 results) Recent Labs    06/01/19 1152  PROBNP 110.0*   HbA1C: Recent Labs    08/12/19 0012  HGBA1C 5.8*   CBG: Recent Labs  Lab 08/12/19 2118 08/13/19 0032 08/13/19 0330 08/13/19 0801 08/13/19 1129  GLUCAP 118* 118* 100* 107* 116*   Lipid Profile: No results for input(s): CHOL, HDL, LDLCALC, TRIG, CHOLHDL, LDLDIRECT in the last 72 hours. Thyroid Function Tests: Recent Labs    08/12/19 0355  TSH 1.841   Anemia Panel: No results for input(s): VITAMINB12, FOLATE, FERRITIN, TIBC, IRON, RETICCTPCT in the last 72 hours. Sepsis Labs: Recent Labs  Lab 08/11/19 1651  LATICACIDVEN 1.3    Recent Results (from the past 240 hour(s))  Culture, blood (routine x 2)     Status: None (Preliminary result)   Collection Time: 08/11/19  4:51 PM   Specimen: BLOOD  Result Value Ref Range Status   Specimen Description   Final    BLOOD RIGHT ANTECUBITAL Performed at [facility] [address] [place], [place] [address]    Special Requests   Final    BOTTLES DRAWN AEROBIC AND ANAEROBIC Blood Culture results may not be optimal due to an excessive volume of blood received in culture bottles Performed at [facility] [address], [place], [place] [address]    Culture   Final    NO GROWTH 2 DAYS Performed at [facility] [address], [place], [place] [address]    Report Status PENDING  Incomplete  Gastrointestinal Panel by PCR , Stool     Status: None   Collection Time: 08/11/19  4:53 PM   Specimen: Stool  Result Value Ref Range Status   Campylobacter species NOT DETECTED NOT DETECTED Final   Plesimonas shigelloides NOT DETECTED NOT DETECTED Final   Salmonella species NOT DETECTED NOT DETECTED Final   Yersinia enterocolitica NOT DETECTED NOT DETECTED Final   Vibrio species NOT DETECTED NOT DETECTED Final   Vibrio cholerae NOT DETECTED NOT DETECTED  Final   Enteroaggregative E coli (EAEC) NOT DETECTED NOT DETECTED Final   Enteropathogenic E coli (EPEC) NOT DETECTED NOT DETECTED Final   Enterotoxigenic E coli (ETEC) NOT DETECTED NOT DETECTED Final   Shiga like toxin producing E coli (STEC) NOT DETECTED NOT DETECTED Final   Shigella/Enteroinvasive E coli (EIEC) NOT DETECTED NOT DETECTED Final   Cryptosporidium NOT DETECTED NOT DETECTED Final   Cyclospora cayetanensis NOT DETECTED NOT DETECTED Final   Entamoeba histolytica NOT DETECTED NOT DETECTED Final   Giardia lamblia NOT DETECTED NOT DETECTED Final   Adenovirus F40/41 NOT DETECTED NOT DETECTED Final   Astrovirus NOT DETECTED NOT DETECTED Final   Norovirus GI/GII NOT DETECTED NOT DETECTED Final   Rotavirus A NOT DETECTED NOT DETECTED Final   Sapovirus (I, II, IV, and V) NOT DETECTED NOT DETECTED Final    Comment: Performed at [facility], [address] [place], [place] [address]  C Difficile Quick Screen w PCR reflex     Status: None   Collection Time: 08/11/19  4:53 PM   Specimen: Stool  Result Value Ref Range Status   C Diff antigen NEGATIVE NEGATIVE Final   C Diff toxin NEGATIVE NEGATIVE Final   C Diff interpretation No C. difficile detected.  Final    Comment: Performed at [facility], [place] [address], [place], [place] [address]  Culture, blood (routine x 2)     Status: None (Preliminary result)   Collection Time: 08/11/19  4:56 PM   Specimen: BLOOD  Result Value Ref Range Status   Specimen Description   Final    BLOOD LEFT ANTECUBITAL Performed at [facility] [address] [place], [place] [address]    Special Requests   Final    BOTTLES DRAWN AEROBIC AND ANAEROBIC Blood Culture results may not be optimal due to an excessive volume of blood received in culture bottles Performed at [facility] [address], [place], [place] [address]    Culture   Final    NO GROWTH 2 DAYS Performed at [facility] [address], [place], [place] [address]    Report Status PENDING  Incomplete  SARS Coronavirus 2 by RT PCR (hospital order, performed in [facility] hospital lab) Nasopharyngeal Nasopharyngeal Swab     Status: None   Collection  Time: 08/11/19  5:01 PM   Specimen: Nasopharyngeal Swab  Result Value Ref Range Status   SARS Coronavirus 2 NEGATIVE NEGATIVE Final    Comment: (NOTE) SARS-CoV-2 target nucleic acids are NOT DETECTED.  The SARS-CoV-2 RNA is generally detectable in upper and lower respiratory specimens during the acute phase of infection. The lowest concentration of SARS-CoV-2 viral copies this assay can detect is 250 copies / mL. A negative result does not preclude SARS-CoV-2 infection and should not be used as the sole basis for treatment or other patient management decisions.  A negative result may occur with improper specimen collection / handling, submission of specimen other than nasopharyngeal swab, presence of viral mutation(s) within the areas targeted by this assay, and inadequate number of viral copies (<250 copies / mL). A negative result must be combined with clinical observations, patient history, and epidemiological information.  Fact Sheet for Patients:   StrictlyIdeas.no  Fact Sheet for Healthcare Providers: BankingDealers.co.za  This test is not yet approved or  cleared by the [place] FDA and has been authorized for detection and/or diagnosis of SARS-CoV-2 by FDA under an Emergency Use Authorization (EUA).  This EUA will remain in effect (meaning this test can be used) for the duration of the COVID-19 declaration under Section 564(b)(1) of the Act, 21 U.S.C. section 360bbb-3(b)(1), unless the authorization is terminated or revoked sooner.  Performed at [facility], [place] [address] [place], [place] [address]   Urine culture     Status: None   Collection Time:  08/11/19  9:28 PM   Specimen: Urine, Random  Result Value Ref Range Status   Specimen Description   Final    URINE, RANDOM Performed at [facility] [address], [place], [place] [address]    Special Requests   Final    NONE Performed at [facility], [place] [address], [place], [place] [address]    Culture   Final    NO GROWTH Performed at [facility], [place] [address] [place], [place] [address]    Report Status 08/13/2019 FINAL  Final         Radiology Studies: CT Abdomen Pelvis W Contrast  Result Date: 08/11/2019 CLINICAL DATA:  Nausea and vomiting. EXAM: CT ABDOMEN AND PELVIS WITH CONTRAST TECHNIQUE: Multidetector CT imaging of the abdomen and pelvis was performed using the standard protocol following bolus administration of intravenous contrast. CONTRAST:  16mL OMNIPAQUE IOHEXOL 300 MG/ML  SOLN COMPARISON:  January 09, 2018. FINDINGS: Lower chest: There is stable pulmonary nodules at the right lung base.The heart size is mildly enlarged. Hepatobiliary: Again noted are multiple hepatic hemangiomas in the right hepatic lobe. Status post cholecystectomy.There is no biliary ductal dilation. Pancreas: Normal contours without ductal dilatation. No peripancreatic fluid collection. Spleen: Unremarkable. Adrenals/Urinary Tract: --Adrenal glands: Unremarkable. --Right kidney/ureter: No hydronephrosis or radiopaque kidney stones. --Left kidney/ureter: No hydronephrosis or radiopaque kidney stones. --Urinary bladder: Unremarkable. Stomach/Bowel: --Stomach/Duodenum: No hiatal hernia or other gastric abnormality. Normal duodenal course and caliber. --Small bowel: Unremarkable. --Colon: Unremarkable. --Appendix: Normal. Vascular/Lymphatic: Atherosclerotic calcification is present within the non-aneurysmal abdominal aorta, without hemodynamically significant stenosis. --No retroperitoneal lymphadenopathy. --No mesenteric lymphadenopathy. --No pelvic or  inguinal lymphadenopathy. Reproductive: Status post hysterectomy. No adnexal mass. Other: No ascites or free air. The abdominal wall is normal. Musculoskeletal. No acute displaced fractures. IMPRESSION: No acute abdominopelvic abnormality. Aortic Atherosclerosis (ICD10-I70.0). Electronically Signed   By: Constance Holster M.D.   On: 08/11/2019 19:28   DG Chest  Port 1 View  Result Date: 08/11/2019 CLINICAL DATA:  Cough with nausea, vomiting diarrhea 6 days. EXAM: PORTABLE CHEST 1 VIEW COMPARISON:  03/04/2013 FINDINGS: Lungs are adequately inflated without consolidation or effusion. Cardiomediastinal silhouette and remainder of the exam is unchanged. IMPRESSION: No active disease. Electronically Signed   By: Marin Olp M.D.   On: 08/11/2019 17:21   ECHOCARDIOGRAM COMPLETE  Result Date: 08/12/2019    ECHOCARDIOGRAM REPORT   Patient Name:   REHMAT MURTAGH Date of Exam: 08/12/2019 Medical Rec #:  [id number]       Height:       63.0 in Accession #:    [id number]      Weight:       300.0 lb Date of Birth:  Jun 15, 1950      BSA:          2.297 m Patient Age:    12 years        BP:           115/54 mmHg Patient Gender: F               HR:           62 bpm. Exam Location:  Inpatient Procedure: 2D Echo, Cardiac Doppler, Color Doppler and Intracardiac            Opacification Agent Indications:    R06.02 SOB  History:        Patient has prior history of Echocardiogram examinations, most                 recent 04/20/2013.  Sonographer:    Merrie Roof RDCS Referring Phys: [place]  1. Left ventricular ejection fraction, by estimation, is 55 to 60%. The left ventricle has normal function. The left ventricle has no regional wall motion abnormalities. Left ventricular diastolic parameters were normal.  2. Right ventricular systolic function is normal. The right ventricular size is normal. Tricuspid regurgitation signal is inadequate for assessing PA pressure.  3. The mitral valve is grossly  normal. Trivial mitral valve regurgitation.  4. The aortic valve is tricuspid. Aortic valve regurgitation is not visualized.  5. The inferior vena cava is dilated in size with <50% respiratory variability, suggesting right atrial pressure of 15 mmHg. FINDINGS  Left Ventricle: Left ventricular ejection fraction, by estimation, is 55 to 60%. The left ventricle has normal function. The left ventricle has no regional wall motion abnormalities. Definity contrast agent was given IV to delineate the left ventricular  endocardial borders. The left ventricular internal cavity size was normal in size. There is no left ventricular hypertrophy. Left ventricular diastolic parameters were normal. Right Ventricle: The right ventricular size is normal. No increase in right ventricular wall thickness. Right ventricular systolic function is normal. Tricuspid regurgitation signal is inadequate for assessing PA pressure. Left Atrium: Left atrial size was normal in size. Right Atrium: Right atrial size was normal in size. Pericardium: Trivial pericardial effusion is present. The pericardial effusion is posterior to the left ventricle. Mitral Valve: The mitral valve is grossly normal. Trivial mitral valve regurgitation. Tricuspid Valve: The tricuspid valve is grossly normal. Tricuspid valve regurgitation is not demonstrated. Aortic Valve: The aortic valve is tricuspid. Aortic valve regurgitation is not visualized. Pulmonic Valve: The pulmonic valve was normal in structure. Pulmonic valve regurgitation is not visualized. Aorta: The aortic root and ascending aorta are structurally normal, with no evidence of dilitation. Venous: The inferior vena cava is dilated in size with less than 50%  respiratory variability, suggesting right atrial pressure of 15 mmHg. IAS/Shunts: No atrial level shunt detected by color flow Doppler.  LEFT VENTRICLE PLAX 2D LVIDd:         5.40 cm      Diastology LVIDs:         3.90 cm      LV e' lateral: 9.57 cm/s LV  PW:         0.90 cm      LV e' medial:  8.16 cm/s LV IVS:        0.90 cm LVOT diam:     2.00 cm LVOT Area:     3.14 cm  LV Volumes (MOD) LV vol d, MOD A2C: 125.0 ml LV vol d, MOD A4C: 145.0 ml LV vol s, MOD A2C: 54.9 ml LV vol s, MOD A4C: 73.7 ml LV SV MOD A2C:     70.1 ml LV SV MOD A4C:     145.0 ml LV SV MOD BP:      70.7 ml RIGHT VENTRICLE             IVC RV Basal diam:  3.10 cm     IVC diam: 2.20 cm RV S prime:     13.90 cm/s TAPSE (M-mode): 2.5 cm LEFT ATRIUM              Index LA diam:        4.10 cm  1.78 cm/m LA Vol (A2C):   100.0 ml 43.53 ml/m LA Vol (A4C):   72.9 ml  31.73 ml/m LA Biplane Vol: 84.9 ml  36.96 ml/m   AORTA Ao Root diam: 2.80 cm Ao Asc diam:  3.10 cm  SHUNTS Systemic Diam: 2.00 cm Lyman Bishop MD Electronically signed by Lyman Bishop MD Signature Date/Time: 08/12/2019/12:55:50 PM    Final    VAS [place] LOWER EXTREMITY VENOUS (DVT)  Result Date: 08/12/2019  Lower Venous DVT Study Indications: Edema.  Risk Factors: None identified. Limitations: Poor ultrasound/tissue interface and body habitus. Comparison Study: No prior studies. Performing Technologist: Oliver Hum RVT  Examination Guidelines: A complete evaluation includes B-mode imaging, spectral Doppler, color Doppler, and power Doppler as needed of all accessible portions of each vessel. Bilateral testing is considered an integral part of a complete examination. Limited examinations for reoccurring indications may be performed as noted. The reflux portion of the exam is performed with the patient in reverse Trendelenburg.  +---------+---------------+---------+-----------+----------+--------------+ RIGHT    CompressibilityPhasicitySpontaneityPropertiesThrombus Aging +---------+---------------+---------+-----------+----------+--------------+ CFV      Full           Yes      Yes                                 +---------+---------------+---------+-----------+----------+--------------+ SFJ      Full                                                         +---------+---------------+---------+-----------+----------+--------------+ FV Prox  Full                                                        +---------+---------------+---------+-----------+----------+--------------+  FV Mid   Full                                                        +---------+---------------+---------+-----------+----------+--------------+ FV Distal               Yes      Yes                                 +---------+---------------+---------+-----------+----------+--------------+ PFV      Full                                                        +---------+---------------+---------+-----------+----------+--------------+ POP      Full           Yes      Yes                                 +---------+---------------+---------+-----------+----------+--------------+ PTV      Full                                                        +---------+---------------+---------+-----------+----------+--------------+ PERO     Full                                                        +---------+---------------+---------+-----------+----------+--------------+   +---------+---------------+---------+-----------+----------+--------------+ LEFT     CompressibilityPhasicitySpontaneityPropertiesThrombus Aging +---------+---------------+---------+-----------+----------+--------------+ CFV      Full           Yes      Yes                                 +---------+---------------+---------+-----------+----------+--------------+ SFJ      Full                                                        +---------+---------------+---------+-----------+----------+--------------+ FV Prox  Full                                                        +---------+---------------+---------+-----------+----------+--------------+ FV Mid   Full                                                         +---------+---------------+---------+-----------+----------+--------------+  FV Distal               Yes      Yes                                 +---------+---------------+---------+-----------+----------+--------------+ PFV      Full                                                        +---------+---------------+---------+-----------+----------+--------------+ POP      Full           Yes      Yes                                 +---------+---------------+---------+-----------+----------+--------------+ PTV      Full                                                        +---------+---------------+---------+-----------+----------+--------------+ PERO     Full                                                        +---------+---------------+---------+-----------+----------+--------------+     Summary: RIGHT: - There is no evidence of deep vein thrombosis in the lower extremity. However, portions of this examination were limited- see technologist comments above.  - No cystic structure found in the popliteal fossa.  LEFT: - There is no evidence of deep vein thrombosis in the lower extremity. However, portions of this examination were limited- see technologist comments above.  - No cystic structure found in the popliteal fossa.  *See table(s) above for measurements and observations. Electronically signed by Monica Martinez MD on 08/12/2019 at 11:38:46 AM.    Final     Scheduled Meds: . divalproex  500 mg Oral BID  . docusate sodium  100 mg Oral BID  . enoxaparin (LOVENOX) injection  60 mg Subcutaneous Q24H  . insulin aspart  0-9 Units Subcutaneous Q4H  . [START ON 08/14/2019] levothyroxine  75 mcg Oral Q0600  . oxybutynin  15 mg Oral QHS  . pantoprazole  40 mg Oral Daily  . Ensure Max Protein  11 oz Oral Daily  . sertraline  150 mg Oral Daily  . simvastatin  20 mg Oral q1800  . sodium chloride flush  3 mL Intravenous Q12H  . topiramate  100 mg Oral BID  . traZODone   100 mg Oral QHS   Continuous Infusions: . sodium chloride 75 mL/hr at 08/13/19 0856     LOS: 1 day    Time spent: 25 mins.    Shawna Clamp, MD [facility]   If 7PM-7AM, please contact night-coverage

## 2019-08-13 NOTE — ED Notes (Signed)
Report given to [facility] for room 535.

## 2019-08-13 NOTE — Progress Notes (Addendum)
Occupational Therapy Evaluation Patient Details Name: LAQUONDA WELBY MRN: [id number] DOB: 09-08-1950 Today's Date: 08/13/2019    History of Present Illness MILLICENT BLAZEJEWSKI is a 69 y.o. female with medical history significant of oSA on CPAP, HLD, DM2, HTN, seizure disorder, obesity, fibromyalgia, GERD  Presented with   nausea vomiting diarrhea since Monday for the past 6 days has not been able to tolerate her medications.   Clinical Impression   PTA, pt lived at home alone and had intermittent assistance from family for IADL tasks. Pt states she had difficulty with pericare and LB dressing PTA, in addition to keeping her house clean due to fatigue/shortness of breath. Pt able to ambulate to bathroom, however complained of feeling "dizzy". VSS after mobility. Prior to mobility BP 127/62; SpO2 98 RA. Pt is not interested in short term rehab. Recommend HHOT and [place] after DC. Will benefit from education on compensatory strategies for ADL, energy conservation and education on DME and AE to assist with LB ADL and pericare. Pt may benefit from use of bariatric rollator - will further assess. Pt very appreciative.     Follow Up Recommendations  Home health OT;Supervision - [facility]termittent [facility] Aide)    Equipment Recommendations  Tub/shower bench;Other (comment) (rollator)    Recommendations for Other Services       Precautions / Restrictions Precautions Precautions: Fall      Mobility Bed Mobility Overal bed mobility: Needs Assistance Bed Mobility: Supine to Sit     Supine to sit: Min guard;HOB elevated     General bed mobility comments: heavy use of rails; does not sleep in bed at home; uses her lift recliner chair  Transfers Overall transfer level: Needs assistance Equipment used: Rolling walker (2 wheeled) Transfers: Sit to/from [facility] Sit to Stand: Min assist Stand pivot transfers: Min assist            Balance Overall balance assessment: Needs  assistance   Sitting balance-Leahy Scale: Good       Standing balance-Leahy Scale: Poor                             ADL either performed or assessed with clinical judgement   ADL Overall ADL's : Needs assistance/impaired Eating/Feeding: Modified independent   Grooming: Set up;Sitting   Upper Body Bathing: Set up;Sitting   Lower Body Bathing: Moderate assistance;Sit to/from stand   Upper Body Dressing : Set up;Sitting   Lower Body Dressing: Moderate assistance;Sit to/from stand   Toilet Transfer: Minimal assistance;Ambulation;Comfort height toilet;Cueing for safety   Toileting- Clothing Manipulation and Hygiene: Maximal assistance;Sit to/from stand Toileting - Clothing Manipulation Details (indicate cue type and reason): Difficulty with pericare due to fatigue/body habitus; would most likely benefit form toilet tongs     Functional mobility during ADLs: Minimal assistance;Cueing for safety;Rolling walker;+2 for safety/equipment General ADL Comments: Socks; pericare are difficult at baseline; would benefit fomr AE to help with ADL; States she doesn't clean and her "house is a mess" due to fatigue     Vision Baseline Vision/History: Wears glasses (states she needs new glasses)       Perception     Praxis      Pertinent Vitals/Pain Pain Assessment: 0-10 Pain Score: 7  Pain Location: hip (L>R) Pain Descriptors / Indicators: Sharp;Aching Pain Intervention(s): Limited activity within patient's tolerance;Monitored during session     Hand Dominance Right   Extremity/Trunk Assessment Upper Extremity Assessment Upper Extremity Assessment: Generalized  weakness   Lower Extremity Assessment Lower Extremity Assessment: Defer to PT evaluation (numbness in the "tops of feet")   Cervical / Trunk Assessment Cervical / Trunk Assessment: Normal;Other exceptions   Communication Communication Communication: No difficulties   Cognition Arousal/Alertness:  Lethargic Behavior During Therapy: WFL for tasks assessed/performed Overall Cognitive Status: Within Functional Limits for tasks assessed    States she has "a little trouble " with memory. Did not sleep well last night; able to say months backwards; Able to recall unfamiliar name address without VC after 10 min delay                                 General Comments       Exercises     Shoulder Instructions      Home Living Family/patient expects to be discharged to:: Private residence Living Arrangements: Alone Available Help at Discharge: Family;Available PRN/intermittently Type of Home: House Home Access: Stairs to enter [employer] of Steps: 3 Entrance Stairs-Rails: Right;Left;Can reach both Home Layout: One level     Bathroom Shower/Tub: Corporate investment banker: Handicapped height     Home Equipment: Marine scientist - single point          Prior Functioning/Environment Level of Independence: Independent with assistive device(s)        Comments: Pt reports using SPC in community, independent with household ambulation. Pt reports independent with ADLs, but some difficulty performing household chores. Pt drives limited distances. Pt reports 2 sons, who check on her, provide groceries, pick up medications and occaisonally cooking. Pt reports some near misses, but no falls. Pt sleeps in recliner due to back/hip pain.        OT Problem List: Decreased strength;Decreased activity tolerance;Impaired balance (sitting and/or standing);Decreased range of motion;Decreased safety awareness;Decreased knowledge of use of DME or AE;Cardiopulmonary status limiting activity;Obesity;Pain      OT Treatment/Interventions: Self-care/ADL training;Therapeutic exercise;Energy conservation;DME and/or AE instruction;Therapeutic activities;Patient/family education;Balance training    OT Goals(Current goals can be found in the care plan section)  Acute Rehab OT Goals Patient Stated Goal: to get better and go home to her puppies OT Goal Formulation: With patient Time For Goal Achievement: 08/27/19 Potential to Achieve Goals: Good  OT Frequency: Min 2X/week   Barriers to D/C:            Co-evaluation PT/OT/SLP Co-Evaluation/Treatment: Yes Reason for Co-Treatment: For patient/therapist safety;To address functional/ADL transfers   OT goals addressed during session: ADL's and self-care      AM-PAC OT "6 Clicks" Daily Activity     Outcome Measure Help from another person eating meals?: None Help from another person taking care of personal grooming?: A Little Help from another person toileting, which includes using toliet, bedpan, or urinal?: A Lot Help from another person bathing (including washing, rinsing, drying)?: A Lot Help from another person to put on and taking off regular upper body clothing?: A Little Help from another person to put on and taking off regular lower body clothing?: A Lot 6 Click Score: 16   End of Session Equipment Utilized During Treatment: Gait belt;Rolling walker Nurse Communication: Mobility status  Activity Tolerance: Patient tolerated treatment well Patient left: in chair;with call bell/phone within reach;with chair alarm set  OT Visit Diagnosis: Unsteadiness on feet (R26.81);Muscle weakness (generalized) (M62.81);Pain Pain - Right/Left: Left Pain - part of body: Hip (back)  Time: 8307-4600 OT Time Calculation (min): 41 min Charges:  OT General Charges $OT Visit: 1 Visit OT Evaluation $OT Eval Moderate Complexity: 1 Mod OT Treatments $Self Care/Home Management : 8-22 mins  Maurie Boettcher, OT/L   Acute OT Clinical Specialist Acute Rehabilitation Services Pager [phone] Office [phone]   [facility] 08/13/2019, 2:24 PM

## 2019-08-14 LAB — CBC
HCT: 33.2 % — ABNORMAL LOW (ref 36.0–46.0)
Hemoglobin: 9.9 g/dL — ABNORMAL LOW (ref 12.0–15.0)
MCH: 26.9 pg (ref 26.0–34.0)
MCHC: 29.8 g/dL — ABNORMAL LOW (ref 30.0–36.0)
MCV: 90.2 fL (ref 80.0–100.0)
Platelets: 145 10*3/uL — ABNORMAL LOW (ref 150–400)
RBC: 3.68 MIL/uL — ABNORMAL LOW (ref 3.87–5.11)
RDW: 16.6 % — ABNORMAL HIGH (ref 11.5–15.5)
WBC: 6.5 10*3/uL (ref 4.0–10.5)
nRBC: 0 % (ref 0.0–0.2)

## 2019-08-14 LAB — GLUCOSE, CAPILLARY
Glucose-Capillary: 103 mg/dL — ABNORMAL HIGH (ref 70–99)
Glucose-Capillary: 109 mg/dL — ABNORMAL HIGH (ref 70–99)
Glucose-Capillary: 115 mg/dL — ABNORMAL HIGH (ref 70–99)
Glucose-Capillary: 123 mg/dL — ABNORMAL HIGH (ref 70–99)
Glucose-Capillary: 137 mg/dL — ABNORMAL HIGH (ref 70–99)
Glucose-Capillary: 98 mg/dL (ref 70–99)

## 2019-08-14 LAB — BASIC METABOLIC PANEL
Anion gap: 7 (ref 5–15)
BUN: 8 mg/dL (ref 8–23)
CO2: 22 mmol/L (ref 22–32)
Calcium: 8 mg/dL — ABNORMAL LOW (ref 8.9–10.3)
Chloride: 107 mmol/L (ref 98–111)
Creatinine, Ser: 0.45 mg/dL (ref 0.44–1.00)
GFR calc Af Amer: >60 mL/min (ref >60)
GFR calc non Af Amer: >60 mL/min (ref >60)
Glucose, Bld: 104 mg/dL — ABNORMAL HIGH (ref 70–99)
Potassium: 3.4 mmol/L — ABNORMAL LOW (ref 3.5–5.1)
Sodium: 136 mmol/L (ref 135–145)

## 2019-08-14 MED ORDER — POTASSIUM CHLORIDE 20 MEQ PO PACK
40.0000 meq | PACK | Freq: Once | ORAL | Status: AC
  Administered 2019-08-14: 40 meq via ORAL
  Filled 2019-08-14: qty 2

## 2019-08-14 MED ORDER — MUSCLE RUB 10-15 % EX CREA
TOPICAL_CREAM | CUTANEOUS | Status: DC | PRN
  Filled 2019-08-14: qty 85

## 2019-08-14 MED ORDER — ACETAMINOPHEN 325 MG PO TABS
650.0000 mg | ORAL_TABLET | Freq: Four times a day (QID) | ORAL | Status: AC | PRN
  Administered 2019-08-14 – 2019-08-15 (×2): 650 mg via ORAL
  Filled 2019-08-14 (×2): qty 2

## 2019-08-14 NOTE — Discharge Summary (Addendum)
Physician Discharge Summary  Alexis THORINGTON Grant:[id number] DOB: 1950/07/25 DOA: 08/11/2019  PCP: Binnie Rail, MD  Admit date: 08/11/2019   Discharge date: 08/15/2019.  Admitted From: Home.  Disposition:  Home with home services.  Recommendations for Outpatient Follow-up:  1. Follow up with PCP in 1-2 weeks 2. Please obtain BMP/CBC in one week 3. Advised to continue physical therapy.  Home Health: Yes Equipment/Devices: DME; Rollator walker, tub, bench, DME 3 in 1  Discharge Condition: Stable. CODE STATUS:Full code Diet recommendation: Heart Healthy   BriefSummary / Hospital course: Alexis Grant a 69 y.o.femalewith medical history significant for OSA on CPAP, HLD, DM2, HTN, seizure disorder, obesity, fibromyalgia, GERD Who presents to the ED withnausea, vomiting, diarrhea since Monday for the past 6 days, she has not been able to tolerate her medications. EMS gave 4 mg of Zofran on their arrival, heart rate 69, blood glucose 159, blood pressure 133/92, reports dry cough and feeling hot. she has been trying to drink Pedialyte,  no blood in stools,  no sick contacts,  patient is vaccinated for Covid.  Noted some leg swelling,  abdominal pain, Noted weight gain up to 40 lb. Severe leg swelling her PCP put her on Lasix fo r 3 days but it did not help with the swelling.  Patient was admitted for dehydration secondary to nausea, vomiting and diarrhea.  CT abdomen is unremarkable, GI panel and C. difficile negative.  Blood cultures,  urine culture negative.  She was continued on IV fluids.  She has improved significantly felt better and wants to be discharged.  She also has bilateral leg edema venous duplex was completed, negative for DVT.  Echocardiogram was completed which was unremarkable EF was 55 to 60%.  All other home medications were continued.  PT recommended home with home physical therapy.  Patient is being discharged home with home services.  Discharge Diagnoses:   Active Problems:   Hypothyroidism   Dyslipidemia   Essential hypertension   Irritable bowel syndrome   DM (diabetes mellitus), type 2 with peripheral vascular complications (HCC)   Generalized convulsive epilepsy (HCC)   Fibromyalgia   GERD (gastroesophageal reflux disease)   Bilateral leg edema  Nausea, vomiting, diarrhea: Improved unclear etiology, this could be gastroenteritis, GI panel and C. difficile negative. CT abdomen unremarkable which is reassuring.  Dehydration -resolved.  Versus intravascular depletion patient with nausea vomiting decreased p.o. intake and diarrhea for the past 1 week. If hemoconcentration and concentrated urine.Given third spacing will very gently rehydrate and follow fluid status  .Bilateral leg edemaunclear etiology in the past patient was told that she has fluid around her heart,  her PCP has tried on Lasix which did not seem to help. Will obtain echogram and leg Dopplers as her right lower extremity slightly more swollen than her left   Patient endorses dyspnea with exertion which is chronic but been getting worse as well as leg edema worsening and weight gain.  Echocardiogram unremarkable, EF 55 to 60%.  Lower extremity renal duplex: no evidence of DVT.   Marland KitchenDM(diabetes mellitus), type 2 with peripheral vascular complications (HCC) -- Order Sensitive SSI  - check TSH and HgA1C - Hold by mouth medications    .Dyslipidemiachronic, stable. continue home medications  .Essential hypertension : Resume home blood pressure medications.  .Fibromyalgia/neuropathy.Hold Neurontin as this can increase leg edema may need to see if no other cause for lower extremity edema-trial off of gabapentin with help.  Marland KitchenGERD(gastroesophageal reflux disease) chronic stable. continue home medications  .  Generalized convulsive epilepsy ([place]) patient off of Depakote for the past 2 days, will restart and follow Depakote level  nontoxic  .Hypothyroidism -chronic stable. continue home medications  .Irritable bowel syndrome could be contributing to nausea vomiting diarrhea  Discharge Instructions  Discharge Instructions    Call MD for:  difficulty breathing, headache or visual disturbances   Complete by: As directed    Call MD for:  persistant dizziness or light-headedness   Complete by: As directed    Call MD for:  persistant nausea and vomiting   Complete by: As directed    Call MD for:  temperature >100.4   Complete by: As directed    Diet - low sodium heart healthy   Complete by: As directed    Discharge instructions   Complete by: As directed    Advised to follow-up with primary care physician in 1 week. Patient has been discharged home with home PT for generalized weakness   Increase activity slowly   Complete by: As directed      Allergies as of 08/14/2019      Reactions   Hydrocodone-acetaminophen Itching   Itches all over; without rash per pt      Medication List    STOP taking these medications   Acetaminophen-Codeine 300-30 MG tablet     TAKE these medications   acetaminophen 500 MG tablet Commonly known as: TYLENOL Take 500 mg by mouth every 6 (six) hours as needed.   blood glucose meter kit and supplies Kit Dispense based on patient and insurance preference. Use up to four times daily as directed. (FOR E11.9).   divalproex 500 MG DR tablet Commonly known as: DEPAKOTE Take 1 tab twice a day What changed:   how much to take  how to take this  when to take this   furosemide 20 MG tablet Commonly known as: LASIX Take 1 tablet (20 mg total) by mouth daily.   gabapentin 300 MG capsule Commonly known as: NEURONTIN Take 2 caps every night What changed:   how much to take  how to take this  when to take this  additional instructions   glipiZIDE 10 MG tablet Commonly known as: GLUCOTROL TAKE 1 TABLET (10 MG TOTAL) BY MOUTH 2 (TWO) TIMES DAILY BEFORE A MEAL.    levothyroxine 75 MCG tablet Commonly known as: SYNTHROID TAKE 1 TABLET BY MOUTH ONCE DAILY BEFORE BREAKFAST What changed: See the new instructions.   loperamide 2 MG capsule Commonly known as: IMODIUM Take 1 capsule (2 mg total) by mouth 4 (four) times daily as needed for diarrhea or loose stools.   losartan 50 MG tablet Commonly known as: COZAAR TAKE 1 TABLET BY MOUTH EVERY DAY   metFORMIN 1000 MG tablet Commonly known as: GLUCOPHAGE Take 1 tablet (1,000 mg total) by mouth 2 (two) times daily with a meal.   omeprazole 40 MG capsule Commonly known as: PRILOSEC TAKE 1 CAPSULE BY MOUTH EVERY DAY What changed: how much to take   ondansetron 4 MG disintegrating tablet Commonly known as: ZOFRAN-ODT Take 1 tablet (4 mg total) by mouth every 8 (eight) hours as needed for nausea or vomiting.   oxybutynin 15 MG 24 hr tablet Commonly known as: DITROPAN XL Take 1 tablet (15 mg total) by mouth at bedtime.   sertraline 100 MG tablet Commonly known as: ZOLOFT Take 1.5 tablets (150 mg total) by mouth daily.   simvastatin 20 MG tablet Commonly known as: ZOCOR TAKE 1 TABLET BY MOUTH EVERYDAY AT BEDTIME  What changed: See the new instructions.   topiramate 100 MG tablet Commonly known as: Topamax Take 1 tablet (100 mg total) by mouth 2 (two) times daily.   traZODone 50 MG tablet Commonly known as: DESYREL TAKE 2 TABLETS BY MOUTH AT BEDTIME FOR SLEEP What changed: See the new instructions.            Durable Medical Equipment  (From admission, onward)         Start     Ordered   08/14/19 1210  DME Gilford Rile  Once       Question Answer Comment  Walker: With 5 Inch Wheels   Patient needs a walker to treat with the following condition Imbalance      08/14/19 1210   08/14/19 1210  DME 3-in-1  Once        08/14/19 1210   08/14/19 1210  DME Tub Bench  Once        08/14/19 1210          Follow-up Information    Schedule an appointment as soon as possible for a visit   with Binnie Rail, MD.   Specialty: Internal Medicine Why: For recheck of your symptoms Contact information: [place] [address] [phone]              Allergies  Allergen Reactions  . Hydrocodone-Acetaminophen Itching    Itches all over; without rash per pt    Consultations:  Physical therapy   Procedures/Studies: CT Abdomen Pelvis W Contrast  Result Date: 08/11/2019 CLINICAL DATA:  Nausea and vomiting. EXAM: CT ABDOMEN AND PELVIS WITH CONTRAST TECHNIQUE: Multidetector CT imaging of the abdomen and pelvis was performed using the standard protocol following bolus administration of intravenous contrast. CONTRAST:  133m OMNIPAQUE IOHEXOL 300 MG/ML  SOLN COMPARISON:  January 09, 2018. FINDINGS: Lower chest: There is stable pulmonary nodules at the right lung base.The heart size is mildly enlarged. Hepatobiliary: Again noted are multiple hepatic hemangiomas in the right hepatic lobe. Status post cholecystectomy.There is no biliary ductal dilation. Pancreas: Normal contours without ductal dilatation. No peripancreatic fluid collection. Spleen: Unremarkable. Adrenals/Urinary Tract: --Adrenal glands: Unremarkable. --Right kidney/ureter: No hydronephrosis or radiopaque kidney stones. --Left kidney/ureter: No hydronephrosis or radiopaque kidney stones. --Urinary bladder: Unremarkable. Stomach/Bowel: --Stomach/Duodenum: No hiatal hernia or other gastric abnormality. Normal duodenal course and caliber. --Small bowel: Unremarkable. --Colon: Unremarkable. --Appendix: Normal. Vascular/Lymphatic: Atherosclerotic calcification is present within the non-aneurysmal abdominal aorta, without hemodynamically significant stenosis. --No retroperitoneal lymphadenopathy. --No mesenteric lymphadenopathy. --No pelvic or inguinal lymphadenopathy. Reproductive: Status post hysterectomy. No adnexal mass. Other: No ascites or free air. The abdominal wall is normal. Musculoskeletal. No acute  displaced fractures. IMPRESSION: No acute abdominopelvic abnormality. Aortic Atherosclerosis (ICD10-I70.0). Electronically Signed   By: CConstance HolsterM.D.   On: 08/11/2019 19:28   DG Chest Port 1 View  Result Date: 08/11/2019 CLINICAL DATA:  Cough with nausea, vomiting diarrhea 6 days. EXAM: PORTABLE CHEST 1 VIEW COMPARISON:  03/04/2013 FINDINGS: Lungs are adequately inflated without consolidation or effusion. Cardiomediastinal silhouette and remainder of the exam is unchanged. IMPRESSION: No active disease. Electronically Signed   By: DMarin OlpM.D.   On: 08/11/2019 17:21   ECHOCARDIOGRAM COMPLETE  Result Date: 08/12/2019    ECHOCARDIOGRAM REPORT   Patient Name:   LHEIDIE KRALLDate of Exam: 08/12/2019 Medical Rec #:  [id number]      Height:       63.0 in Accession #:    [id number]  Weight:       300.0 lb Date of Birth:  August 15, 1950      BSA:          2.297 m Patient Age:    69 years        BP:           115/54 mmHg Patient Gender: F               HR:           62 bpm. Exam Location:  Inpatient Procedure: 2D Echo, Cardiac Doppler, Color Doppler and Intracardiac            Opacification Agent Indications:    R06.02 SOB  History:        Patient has prior history of Echocardiogram examinations, most                 recent 04/20/2013.  Sonographer:    Merrie Roof RDCS Referring Phys: [place]  1. Left ventricular ejection fraction, by estimation, is 55 to 60%. The left ventricle has normal function. The left ventricle has no regional wall motion abnormalities. Left ventricular diastolic parameters were normal.  2. Right ventricular systolic function is normal. The right ventricular size is normal. Tricuspid regurgitation signal is inadequate for assessing PA pressure.  3. The mitral valve is grossly normal. Trivial mitral valve regurgitation.  4. The aortic valve is tricuspid. Aortic valve regurgitation is not visualized.  5. The inferior vena cava is dilated in size with  <50% respiratory variability, suggesting right atrial pressure of 15 mmHg. FINDINGS  Left Ventricle: Left ventricular ejection fraction, by estimation, is 55 to 60%. The left ventricle has normal function. The left ventricle has no regional wall motion abnormalities. Definity contrast agent was given IV to delineate the left ventricular  endocardial borders. The left ventricular internal cavity size was normal in size. There is no left ventricular hypertrophy. Left ventricular diastolic parameters were normal. Right Ventricle: The right ventricular size is normal. No increase in right ventricular wall thickness. Right ventricular systolic function is normal. Tricuspid regurgitation signal is inadequate for assessing PA pressure. Left Atrium: Left atrial size was normal in size. Right Atrium: Right atrial size was normal in size. Pericardium: Trivial pericardial effusion is present. The pericardial effusion is posterior to the left ventricle. Mitral Valve: The mitral valve is grossly normal. Trivial mitral valve regurgitation. Tricuspid Valve: The tricuspid valve is grossly normal. Tricuspid valve regurgitation is not demonstrated. Aortic Valve: The aortic valve is tricuspid. Aortic valve regurgitation is not visualized. Pulmonic Valve: The pulmonic valve was normal in structure. Pulmonic valve regurgitation is not visualized. Aorta: The aortic root and ascending aorta are structurally normal, with no evidence of dilitation. Venous: The inferior vena cava is dilated in size with less than 50% respiratory variability, suggesting right atrial pressure of 15 mmHg. IAS/Shunts: No atrial level shunt detected by color flow Doppler.  LEFT VENTRICLE PLAX 2D LVIDd:         5.40 cm      Diastology LVIDs:         3.90 cm      LV e' lateral: 9.57 cm/s LV PW:         0.90 cm      LV e' medial:  8.16 cm/s LV IVS:        0.90 cm LVOT diam:     2.00 cm LVOT Area:     3.14 cm  LV Volumes (MOD) LV vol  d, MOD A2C: 125.0 ml LV vol d,  MOD A4C: 145.0 ml LV vol s, MOD A2C: 54.9 ml LV vol s, MOD A4C: 73.7 ml LV SV MOD A2C:     70.1 ml LV SV MOD A4C:     145.0 ml LV SV MOD BP:      70.7 ml RIGHT VENTRICLE             IVC RV Basal diam:  3.10 cm     IVC diam: 2.20 cm RV S prime:     13.90 cm/s TAPSE (M-mode): 2.5 cm LEFT ATRIUM              Index LA diam:        4.10 cm  1.78 cm/m LA Vol (A2C):   100.0 ml 43.53 ml/m LA Vol (A4C):   72.9 ml  31.73 ml/m LA Biplane Vol: 84.9 ml  36.96 ml/m   AORTA Ao Root diam: 2.80 cm Ao Asc diam:  3.10 cm  SHUNTS Systemic Diam: 2.00 cm Lyman Bishop MD Electronically signed by Lyman Bishop MD Signature Date/Time: 08/12/2019/12:55:50 PM    Final    VAS [place] LOWER EXTREMITY VENOUS (DVT)  Result Date: 08/12/2019  Lower Venous DVT Study Indications: Edema.  Risk Factors: None identified. Limitations: Poor ultrasound/tissue interface and body habitus. Comparison Study: No prior studies. Performing Technologist: Oliver Hum RVT  Examination Guidelines: A complete evaluation includes B-mode imaging, spectral Doppler, color Doppler, and power Doppler as needed of all accessible portions of each vessel. Bilateral testing is considered an integral part of a complete examination. Limited examinations for reoccurring indications may be performed as noted. The reflux portion of the exam is performed with the patient in reverse Trendelenburg.  +---------+---------------+---------+-----------+----------+--------------+ RIGHT    CompressibilityPhasicitySpontaneityPropertiesThrombus Aging +---------+---------------+---------+-----------+----------+--------------+ CFV      Full           Yes      Yes                                 +---------+---------------+---------+-----------+----------+--------------+ SFJ      Full                                                        +---------+---------------+---------+-----------+----------+--------------+ FV Prox  Full                                                         +---------+---------------+---------+-----------+----------+--------------+ FV Mid   Full                                                        +---------+---------------+---------+-----------+----------+--------------+ FV Distal               Yes      Yes                                 +---------+---------------+---------+-----------+----------+--------------+  PFV      Full                                                        +---------+---------------+---------+-----------+----------+--------------+ POP      Full           Yes      Yes                                 +---------+---------------+---------+-----------+----------+--------------+ PTV      Full                                                        +---------+---------------+---------+-----------+----------+--------------+ PERO     Full                                                        +---------+---------------+---------+-----------+----------+--------------+   +---------+---------------+---------+-----------+----------+--------------+ LEFT     CompressibilityPhasicitySpontaneityPropertiesThrombus Aging +---------+---------------+---------+-----------+----------+--------------+ CFV      Full           Yes      Yes                                 +---------+---------------+---------+-----------+----------+--------------+ SFJ      Full                                                        +---------+---------------+---------+-----------+----------+--------------+ FV Prox  Full                                                        +---------+---------------+---------+-----------+----------+--------------+ FV Mid   Full                                                        +---------+---------------+---------+-----------+----------+--------------+ FV Distal               Yes      Yes                                  +---------+---------------+---------+-----------+----------+--------------+ PFV      Full                                                        +---------+---------------+---------+-----------+----------+--------------+  POP      Full           Yes      Yes                                 +---------+---------------+---------+-----------+----------+--------------+ PTV      Full                                                        +---------+---------------+---------+-----------+----------+--------------+ PERO     Full                                                        +---------+---------------+---------+-----------+----------+--------------+     Summary: RIGHT: - There is no evidence of deep vein thrombosis in the lower extremity. However, portions of this examination were limited- see technologist comments above.  - No cystic structure found in the popliteal fossa.  LEFT: - There is no evidence of deep vein thrombosis in the lower extremity. However, portions of this examination were limited- see technologist comments above.  - No cystic structure found in the popliteal fossa.  *See table(s) above for measurements and observations. Electronically signed by Monica Martinez MD on 08/12/2019 at 11:38:46 AM.    Final     Echo, lower extremity venous duplex   Subjective: Patient was seen and examined at bedside.  She reports feeling much better, nausea vomiting and diarrhea has resolved.  She feels better and wants to be discharged home.  No overnight events  Discharge Exam: Vitals:   08/14/19 0016 08/14/19 0537  BP:  (!) 140/70  Pulse:  55  Resp: 18 17  Temp:  98.9 F (37.2 C)  SpO2:  95%   Vitals:   08/13/19 1629 08/13/19 2142 08/14/19 0016 08/14/19 0537  BP: (!) 145/67 (!) 142/61  (!) 140/70  Pulse: 56 56  55  Resp: _0 Temp: 98.9 F (37.2 C) 99.5 F (37.5 C)  98.9 F (37.2 C)  TempSrc: Oral Oral  Oral  SpO2: 98% 97%  95%  Weight:      Height:         General: Pt is alert, awake, not in acute distress Cardiovascular: RRR, S1/S2 +, no rubs, no gallops Respiratory: CTA bilaterally, no wheezing, no rhonchi Abdominal: Soft, NT, ND, bowel sounds + Extremities: no edema, no cyanosis    The results of significant diagnostics from this hospitalization (including imaging, microbiology, ancillary and laboratory) are listed below for reference.     Microbiology: Recent Results (from the past 240 hour(s))  Culture, blood (routine x 2)     Status: None (Preliminary result)   Collection Time: 08/11/19  4:51 PM   Specimen: BLOOD  Result Value Ref Range Status   Specimen Description   Final    BLOOD RIGHT ANTECUBITAL Performed at [facility] [address], [place], [place] [address]    Special Requests   Final    BOTTLES DRAWN AEROBIC AND ANAEROBIC Blood Culture results may not be optimal due to an excessive volume of blood received in culture bottles Performed  at [facility], [place] [address], [place], [place] [address]    Culture   Final    NO GROWTH 2 DAYS Performed at [facility] [address] [place], [place] [address]    Report Status PENDING  Incomplete  Gastrointestinal Panel by PCR , Stool     Status: None   Collection Time: 08/11/19  4:53 PM   Specimen: Stool  Result Value Ref Range Status   Campylobacter species NOT DETECTED NOT DETECTED Final   Plesimonas shigelloides NOT DETECTED NOT DETECTED Final   Salmonella species NOT DETECTED NOT DETECTED Final   Yersinia enterocolitica NOT DETECTED NOT DETECTED Final   Vibrio species NOT DETECTED NOT DETECTED Final   Vibrio cholerae NOT DETECTED NOT DETECTED Final   Enteroaggregative E coli (EAEC) NOT DETECTED NOT DETECTED Final   Enteropathogenic E coli (EPEC) NOT DETECTED NOT DETECTED Final   Enterotoxigenic E coli (ETEC) NOT DETECTED NOT DETECTED Final   Shiga like toxin producing E coli (STEC) NOT DETECTED NOT DETECTED  Final   Shigella/Enteroinvasive E coli (EIEC) NOT DETECTED NOT DETECTED Final   Cryptosporidium NOT DETECTED NOT DETECTED Final   Cyclospora cayetanensis NOT DETECTED NOT DETECTED Final   Entamoeba histolytica NOT DETECTED NOT DETECTED Final   Giardia lamblia NOT DETECTED NOT DETECTED Final   Adenovirus F40/41 NOT DETECTED NOT DETECTED Final   Astrovirus NOT DETECTED NOT DETECTED Final   Norovirus GI/GII NOT DETECTED NOT DETECTED Final   Rotavirus A NOT DETECTED NOT DETECTED Final   Sapovirus (I, II, IV, and V) NOT DETECTED NOT DETECTED Final    Comment: Performed at [facility], [address] [place], [place] [address]  C Difficile Quick Screen w PCR reflex     Status: None   Collection Time: 08/11/19  4:53 PM   Specimen: Stool  Result Value Ref Range Status   C Diff antigen NEGATIVE NEGATIVE Final   C Diff toxin NEGATIVE NEGATIVE Final   C Diff interpretation No C. difficile detected.  Final    Comment: Performed at [facility], [place] [address], [place], [place] [address]  Culture, blood (routine x 2)     Status: None (Preliminary result)   Collection Time: 08/11/19  4:56 PM   Specimen: BLOOD  Result Value Ref Range Status   Specimen Description   Final    BLOOD LEFT ANTECUBITAL Performed at [facility] [address] [place], [place] [address]    Special Requests   Final    BOTTLES DRAWN AEROBIC AND ANAEROBIC Blood Culture results may not be optimal due to an excessive volume of blood received in culture bottles Performed at [facility] [address] [place], [place] [address]    Culture   Final    NO GROWTH 2 DAYS Performed at [facility] [address], [place],  [address]    Report Status PENDING  Incomplete  SARS Coronavirus 2 by RT PCR (hospital order, performed in [facility] hospital lab) Nasopharyngeal Nasopharyngeal Swab     Status: None   Collection Time: 08/11/19  5:01 PM    Specimen: Nasopharyngeal Swab  Result Value Ref Range Status   SARS Coronavirus 2 NEGATIVE NEGATIVE Final    Comment: (NOTE) SARS-CoV-2 target nucleic acids are NOT DETECTED.  The SARS-CoV-2 RNA is generally detectable in upper and lower respiratory specimens during the acute phase of infection. The lowest concentration of SARS-CoV-2 viral copies this assay can detect  is 250 copies / mL. A negative result does not preclude SARS-CoV-2 infection and should not be used as the sole basis for treatment or other patient management decisions.  A negative result may occur with improper specimen collection / handling, submission of specimen other than nasopharyngeal swab, presence of viral mutation(s) within the areas targeted by this assay, and inadequate number of viral copies (<250 copies / mL). A negative result must be combined with clinical observations, patient history, and epidemiological information.  Fact Sheet for Patients:   StrictlyIdeas.no  Fact Sheet for Healthcare Providers: BankingDealers.co.za  This test is not yet approved or  cleared by the [place] FDA and has been authorized for detection and/or diagnosis of SARS-CoV-2 by FDA under an Emergency Use Authorization (EUA).  This EUA will remain in effect (meaning this test can be used) for the duration of the COVID-19 declaration under Section 564(b)(1) of the Act, 21 U.S.C. section 360bbb-3(b)(1), unless the authorization is terminated or revoked sooner.  Performed at [facility], [place] [address], [place], [place] [address]   Urine culture     Status: None   Collection Time: 08/11/19  9:28 PM   Specimen: Urine, Random  Result Value Ref Range Status   Specimen Description   Final    URINE, RANDOM Performed at [place] [address], [place], [place] [address]    Special Requests   Final    NONE Performed at [facility], [place] [address], [place], [place] [address]    Culture   Final    NO GROWTH Performed at [facility], [place] [address] [place], [place] [address]    Report Status 08/13/2019 FINAL  Final     Labs: BNP (last 3 results) Recent Labs    08/12/19 0012  BNP 413.2*   Basic Metabolic Panel: Recent Labs  Lab 08/11/19 1651 08/12/19 0355 08/13/19 0600 08/14/19 0407  NA 141 140 137 136  K 3.3* 3.6 3.3* 3.4*  CL 108 109 106 107  CO2 _0 GLUCOSE 154* 109* 113* 104*  BUN 8 6* 6* 8  CREATININE 0.62 0.47 0.50 0.45  CALCIUM 9.0 8.4* 8.4* 8.0*  MG 2.2 1.9 1.9  --   PHOS  --  3.3  --   --    Liver Function Tests: Recent Labs  Lab 08/11/19 1651 08/12/19 0355  AST 11* 9*  ALT 9 8  ALKPHOS 49 37*  BILITOT 0.4 0.3  PROT 6.9 5.7*  ALBUMIN 3.6 3.0*   Recent Labs  Lab 08/11/19 1651  LIPASE 20   No results for input(s): AMMONIA in the last 168 hours. CBC: Recent Labs  Lab 08/11/19 1651 08/12/19 0355 08/13/19 0600 08/14/19 0407  WBC 8.2 9.0 8.5 6.5  NEUTROABS 5.8 5.5  --   --   HGB 12.4 10.4* 10.7* 9.9*  HCT 39.7 33.4* 34.1* 33.2*  MCV 86.1 85.9 85.5 90.2  PLT 231 177 161 145*   Cardiac Enzymes: No results for input(s): CKTOTAL, CKMB, CKMBINDEX, TROPONINI in the last 168 hours. BNP: Invalid input(s): POCBNP CBG: Recent Labs  Lab 08/13/19 2024 08/14/19 0009 08/14/19 0355 08/14/19 0756 08/14/19 1137  GLUCAP 106* 123* 98 103* 137*   D-Dimer Recent Labs    08/12/19 0012  DDIMER 0.83*   Hgb A1c Recent Labs    08/12/19 0012  HGBA1C 5.8*   Lipid Profile No results for input(s): CHOL, HDL, LDLCALC, TRIG, CHOLHDL, LDLDIRECT in the  last 72 hours. Thyroid function studies Recent Labs    08/12/19 0355  TSH 1.841   Anemia work up No results for input(s): VITAMINB12, FOLATE, FERRITIN, TIBC, IRON, RETICCTPCT in the last 72 hours. Urinalysis    Component Value Date/Time   COLORURINE YELLOW 08/11/2019 2128   APPEARANCEUR  CLEAR 08/11/2019 2128   LABSPEC >1.046 (H) 08/11/2019 2128   PHURINE 6.0 08/11/2019 2128   GLUCOSEU NEGATIVE 08/11/2019 2128   HGBUR NEGATIVE 08/11/2019 2128   HGBUR trace-lysed 08/03/2007 1502   BILIRUBINUR NEGATIVE 08/11/2019 2128   BILIRUBINUR negative 03/03/2018 1022   KETONESUR 20 (A) 08/11/2019 2128   PROTEINUR NEGATIVE 08/11/2019 2128   UROBILINOGEN 0.2 03/03/2018 1022   UROBILINOGEN 0.2 11/10/2008 1807   NITRITE NEGATIVE 08/11/2019 2128   LEUKOCYTESUR NEGATIVE 08/11/2019 2128   Sepsis Labs Invalid input(s): PROCALCITONIN,  WBC,  LACTICIDVEN Microbiology Recent Results (from the past 240 hour(s))  Culture, blood (routine x 2)     Status: None (Preliminary result)   Collection Time: 08/11/19  4:51 PM   Specimen: BLOOD  Result Value Ref Range Status   Specimen Description   Final    BLOOD RIGHT ANTECUBITAL Performed at [facility], [place] [address], [place], [place] [address]    Special Requests   Final    BOTTLES DRAWN AEROBIC AND ANAEROBIC Blood Culture results may not be optimal due to an excessive volume of blood received in culture bottles Performed at [place] [address] [place], [place] [address]    Culture   Final    NO GROWTH 2 DAYS Performed at [place] [address], [place], [place] [address]    Report Status PENDING  Incomplete  Gastrointestinal Panel by PCR , Stool     Status: None   Collection Time: 08/11/19  4:53 PM   Specimen: Stool  Result Value Ref Range Status   Campylobacter species NOT DETECTED NOT DETECTED Final   Plesimonas shigelloides NOT DETECTED NOT DETECTED Final   Salmonella species NOT DETECTED NOT DETECTED Final   Yersinia enterocolitica NOT DETECTED NOT DETECTED Final   Vibrio species NOT DETECTED NOT DETECTED Final   Vibrio cholerae NOT DETECTED NOT DETECTED Final   Enteroaggregative E coli (EAEC) NOT DETECTED NOT DETECTED Final   Enteropathogenic E coli (EPEC) NOT DETECTED  NOT DETECTED Final   Enterotoxigenic E coli (ETEC) NOT DETECTED NOT DETECTED Final   Shiga like toxin producing E coli (STEC) NOT DETECTED NOT DETECTED Final   Shigella/Enteroinvasive E coli (EIEC) NOT DETECTED NOT DETECTED Final   Cryptosporidium NOT DETECTED NOT DETECTED Final   Cyclospora cayetanensis NOT DETECTED NOT DETECTED Final   Entamoeba histolytica NOT DETECTED NOT DETECTED Final   Giardia lamblia NOT DETECTED NOT DETECTED Final   Adenovirus F40/41 NOT DETECTED NOT DETECTED Final   Astrovirus NOT DETECTED NOT DETECTED Final   Norovirus GI/GII NOT DETECTED NOT DETECTED Final   Rotavirus A NOT DETECTED NOT DETECTED Final   Sapovirus (I, II, IV, and V) NOT DETECTED NOT DETECTED Final    Comment: Performed at [facility], [place]., [place], [place] [address]  C Difficile Quick Screen w PCR reflex     Status: None   Collection Time: 08/11/19  4:53 PM   Specimen: Stool  Result Value Ref Range Status   C Diff antigen NEGATIVE NEGATIVE Final   C Diff toxin NEGATIVE NEGATIVE Final   C Diff interpretation No C. difficile detected.  Final  Comment: Performed at [facility], [place] [address] [place], [place] [address]  Culture, blood (routine x 2)     Status: None (Preliminary result)   Collection Time: 08/11/19  4:56 PM   Specimen: BLOOD  Result Value Ref Range Status   Specimen Description   Final    BLOOD LEFT ANTECUBITAL Performed at [place] [address] [place], [place] [address]    Special Requests   Final    BOTTLES DRAWN AEROBIC AND ANAEROBIC Blood Culture results may not be optimal due to an excessive volume of blood received in culture bottles Performed at [place] [address] [place], [place] [address]    Culture   Final    NO GROWTH 2 DAYS Performed at [place] [address] [place], [place] [address]    Report Status PENDING  Incomplete  SARS Coronavirus 2 by  RT PCR (hospital order, performed in [facility] hospital lab) Nasopharyngeal Nasopharyngeal Swab     Status: None   Collection Time: 08/11/19  5:01 PM   Specimen: Nasopharyngeal Swab  Result Value Ref Range Status   SARS Coronavirus 2 NEGATIVE NEGATIVE Final    Comment: (NOTE) SARS-CoV-2 target nucleic acids are NOT DETECTED.  The SARS-CoV-2 RNA is generally detectable in upper and lower respiratory specimens during the acute phase of infection. The lowest concentration of SARS-CoV-2 viral copies this assay can detect is 250 copies / mL. A negative result does not preclude SARS-CoV-2 infection and should not be used as the sole basis for treatment or other patient management decisions.  A negative result may occur with improper specimen collection / handling, submission of specimen other than nasopharyngeal swab, presence of viral mutation(s) within the areas targeted by this assay, and inadequate number of viral copies (<250 copies / mL). A negative result must be combined with clinical observations, patient history, and epidemiological information.  Fact Sheet for Patients:   StrictlyIdeas.no  Fact Sheet for Healthcare Providers: BankingDealers.co.za  This test is not yet approved or  cleared by the [place] FDA and has been authorized for detection and/or diagnosis of SARS-CoV-2 by FDA under an Emergency Use Authorization (EUA).  This EUA will remain in effect (meaning this test can be used) for the duration of the COVID-19 declaration under Section 564(b)(1) of the Act, 21 U.S.C. section 360bbb-3(b)(1), unless the authorization is terminated or revoked sooner.  Performed at [facility], [place] [address], [place], [place] [address]   Urine culture     Status: None   Collection Time: 08/11/19  9:28 PM   Specimen: Urine, Random  Result Value Ref Range Status   Specimen Description   Final    URINE,  RANDOM Performed at [place] [address], [place], [place] [address]    Special Requests   Final    NONE Performed at [facility], [place] [address], [place], [place] [address]    Culture   Final    NO GROWTH Performed at [facility], [place] [address], [place], [place] [address]    Report Status 08/13/2019 FINAL  Final     Time coordinating discharge: Over 30 minutes  SIGNED:   Shawna Clamp, MD  [facility] 08/14/2019, 12:12 PM Pager   If 7PM-7AM, please contact night-coverage [email or web address]

## 2019-08-14 NOTE — TOC Initial Note (Addendum)
Transition of Care [facility]) - Initial/Assessment Note    Patient Details  Name: Alexis Grant MRN: [id number] Date of Birth: August 09, 1950  Transition of Care [facility]) CM/SW Contact:    Trish Mage, LCSW Phone Number: 08/14/2019, 10:44 AM  Clinical Narrative:    Patient was seen in follow up to PT/OT recommendation of [facility] PT/OT.  [name] lives alone in [place], has the support of family locally, particularly grandson who lives close by and gets her groceries and medications for her.  She is open to services int he home, and is also open to DME.  Identified [facility] for [facility] services, [employer] for DME. TOC will continue to follow during the course of hospitalization.  Addendum:  Called son to ask him to measure dimensions of toilet to see if bariatric 3 in 1 would fit in. He let me know that toilet is not working, and needs to be replaced.  Also stated house is not set up for walker, as she has been staying in one room in lift chair, and they have ordered a dumpster in order to make home safely navigable. They believe best plan is for her to go to SNF, but she is refusing. MD alerted.  Will keep her tonight.                 Expected Discharge Plan: [place] Barriers to Discharge: No Barriers Identified   Patient Goals and CMS Choice     Choice offered to / list presented to : Patient  Expected Discharge Plan and Services Expected Discharge Plan: [place]   Discharge Planning Services: CM Consult Post Acute Care Choice: Durable Medical Equipment, Home Health Living arrangements for the past 2 months: Single Family Home                 DME Arranged: 3-N-1, Tub bench, Walker rolling with seat DME Agency: [facility] [employer] Date DME Agency Contacted: 08/14/19 Time DME Agency Contacted: 4 Representative spoke with at DME Agency: Thedore Mins HH Arranged: PT, Nurse's Aide [place] Agency: [place] Date [place]: 08/14/19 Time [place]: 29 Representative spoke with at [place]: Cindie  Prior Living Arrangements/Services Living arrangements for the past 2 months: [place] with:: Self Patient language and need for interpreter reviewed:: Yes        Need for Family Participation in Patient Care: Yes (Comment) Care giver support system in place?: Yes (comment)   Criminal Activity/Legal Involvement Pertinent to Current Situation/Hospitalization: No - Comment as needed  Activities of Daily Living Home Assistive Devices/Equipment: CBG Meter, CPAP, Eyeglasses ADL Screening (condition at time of admission) Patient's cognitive ability adequate to safely complete daily activities?: Yes Is the patient deaf or have difficulty hearing?: No Does the patient have difficulty seeing, even when wearing glasses/contacts?: No Does the patient have difficulty concentrating, remembering, or making decisions?: No Patient able to express need for assistance with ADLs?: Yes Does the patient have difficulty dressing or bathing?: No Independently performs ADLs?: Yes (appropriate for developmental age) Does the patient have difficulty walking or climbing stairs?: No Weakness of Legs: Both Weakness of Arms/Hands: None  Permission Sought/Granted                  Emotional Assessment Appearance:: Appears stated age Attitude/Demeanor/Rapport: Engaged Affect (typically observed): Appropriate Orientation: : Oriented to Self, Oriented to Place, Oriented to  Time, Oriented to Situation Alcohol / Substance Use: Not Applicable  Psych Involvement: No (comment)  Admission diagnosis:  Dehydration [E86.0] Hypokalemia [E87.6] Peripheral edema [R60.9] Fatigue, unspecified type [R53.83] Diarrhea, unspecified type [R19.7] Patient Active Problem List   Diagnosis Date Noted  . Dehydration 08/11/2019  . Bilateral leg edema 06/01/2019  . Overactive bladder 11/23/2018  . Frequent UTI 05/03/2018  . Lump of skin  12/30/2017  . Watery eyes 12/30/2017  . Polyneuropathy 09/27/2017  . Osteoarthritis, hip, bilateral 05/25/2017  . Osteopenia 11/22/2016  . GERD (gastroesophageal reflux disease) 11/15/2016  . Difficulty sleeping 05/24/2016  . Rosacea 11/25/2015  . Poor balance 11/25/2015  . Tremor 04/18/2015  . Other fatigue 02/17/2015  . Migraine with aura and without status migrainosus, not intractable 12/04/2014  . Localization-related (focal) (partial) idiopathic epilepsy and epileptic syndromes with seizures of localized onset, not intractable, without status epilepticus ([place]) 12/04/2014  . Hx of adenomatous colonic polyps 07/16/2014  . Benign neoplasm of ascending colon 07/16/2014  . Benign neoplasm of descending colon 07/16/2014  . Benign neoplasm of cecum 07/16/2014  . Benign neoplasm of transverse colon 07/16/2014  . Fibromyalgia 05/30/2013  . Generalized convulsive epilepsy ([place]) 10/20/2012  . Seasonal allergies 12/08/2011  . DM (diabetes mellitus), type 2 with peripheral vascular complications ([place]) 60/73/7106  . Essential hypertension 01/05/2010  . Irritable bowel syndrome 01/05/2010  . VERTIGO 09/16/2009  . VITAMIN D DEFICIENCY 02/24/2009  . MYOCARDIAL INFARCTION, ACUTE, SUBENDOCARDIAL 10/04/2008  . Hypothyroidism 03/19/2008  . COLONIC POLYPS, ADENOMATOUS, HX OF 08/07/2007  . HEMANGIOMA 04/26/2007  . Dyslipidemia 04/26/2007  . OBESITY 04/26/2007  . Depression 04/26/2007  . SLEEP APNEA, OBSTRUCTIVE 04/26/2007  . HIATAL HERNIA 04/26/2007  . FATTY LIVER DISEASE 04/26/2007   PCP:  Binnie Rail, MD Pharmacy:   [id number] [place], [place] [address] Phone: [phone] Fax: [phone]     Social Determinants of Health (SDOH) Interventions    Readmission Risk Interventions No flowsheet data found.

## 2019-08-14 NOTE — Progress Notes (Signed)
Occupational Therapy Treatment Patient Details Name: Alexis Grant MRN: [id number] DOB: 12-29-1950 Today's Date: 08/14/2019    History of present illness Alexis Grant is a 69 y.o. female with medical history significant of oSA on CPAP, HLD, DM2, HTN, seizure disorder, obesity, fibromyalgia, GERD  Presented with   nausea vomiting diarrhea since Monday for the past 6 days has not been able to tolerate her medications.   OT comments  Patient progressing and showed improved ability to perform LE dressing at EOB once educated on adaptive equipment, compared to previous session. Patient limited today by continued nausea along with c/o feeling "hot" in addition to deficits noted below. Nursing notified of pt's nausea.  Pt continues to demonstrate good rehab potential and would benefit from continued skilled OT to increase safety and independence with ADLs and functional transfers to allow pt to return home safely and reduce caregiver burden and fall risk.   Follow Up Recommendations  Home health OT;Supervision - Intermittent [facility] Aide)    Equipment Recommendations  Tub/shower bench;Other (comment) (Rollator)    Recommendations for Other Services      Precautions / Restrictions Precautions Precautions: Fall Restrictions Weight Bearing Restrictions: No       Mobility Bed Mobility   Bed Mobility: Sit to Supine       Sit to supine: Modified independent (Device/Increase time)   General bed mobility comments: Pt self-initiated return to supine from EOB due to nausea. HOB elevated and use of rails. No assistance.  Posterior scooting in supine with Max As of 2 people.  Transfers Overall transfer level:  (Pt declined)                    Balance Overall balance assessment: Needs assistance Sitting-balance support: No upper extremity supported Sitting balance-Leahy Scale: Good Sitting balance - Comments: seated EOB       Standing balance comment: Unable to assess due to pt  refusal to stand this session.                           ADL either performed or assessed with clinical judgement   ADL Overall ADL's : Needs assistance/impaired       Grooming Details (indicate cue type and reason): Pt reports all grooming completed for the day.             Lower Body Dressing: Set up;Supervision/safety;Cueing for compensatory techniques;Sitting/lateral leans Lower Body Dressing Details (indicate cue type and reason): Pt trained on adaptive equipment for energy conservation and pain mngt during LB dressing.  Pt used reacher to don diaper brief over feet and brought to lap.  Cues to dress weaker LT LE first for task simplification. Pt able to perform task with supervision, sitting EOB. Pt declined standing to complete LE dressing due to feeling nauseated and hot.   Toilet Transfer Details (indicate cue type and reason): Pt declined and requesting pure wick. Pt encouraged to move ad lib and use commode with staff whenever possible.  Pt continued to decline due to nausea. Pt apologetic and reassured.   Toileting - Clothing Manipulation Details (indicate cue type and reason): Toilet tongs issused to pt and pt educated on use of tongs, recommended flushable wet wipes for increased ease. Pt also educated on attachable bidet as second options to assist with hygiene and given written information on this was general features and pricing. Pt verbalized information to all and simulated use, showing front to back hygiene  per instructions. Declined to try in bathroom due to constipation.     Functional mobility during ADLs:  (Pt refused standing or ambulating to bathroom this session due to nausea and feeling hot. Bed mobility only.)       Vision Baseline Vision/History: Wears glasses Vision Assessment?: No apparent visual deficits   Perception     Praxis      Cognition Arousal/Alertness: Awake/alert Behavior During Therapy: WFL for tasks  assessed/performed Overall Cognitive Status: Within Functional Limits for tasks assessed                                                            Pertinent Vitals/ Pain       Pain Assessment: No/denies pain      Vitals:   08/14/19 0016 08/14/19 0537  BP:  (!) 140/70  Pulse:  55  Resp: 18 17  Temp:  98.9 F (37.2 C)  SpO2:  95%                                                         Frequency  Min 2X/week        Progress Toward Goals  OT Goals(current goals can now be found in the care plan section)  Progress towards OT goals: Progressing toward goals  Acute Rehab OT Goals Patient Stated Goal: Find out why nausea is still occuring. Be ale to dress self and perform peri hygiene at home. OT Goal Formulation: With patient Time For Goal Achievement: 08/27/19 Potential to Achieve Goals: Good  Plan Discharge plan remains appropriate    Co-evaluation                 AM-PAC OT "6 Clicks" Daily Activity     Outcome Measure   Help from another person eating meals?: None Help from another person taking care of personal grooming?: A Little Help from another person toileting, which includes using toliet, bedpan, or urinal?: A Lot Help from another person bathing (including washing, rinsing, drying)?: A Lot Help from another person to put on and taking off regular upper body clothing?: A Little Help from another person to put on and taking off regular lower body clothing?: A Little 6 Click Score: 17    End of Session    OT Visit Diagnosis: Unsteadiness on feet (R26.81);Muscle weakness (generalized) (M62.81);Pain   Activity Tolerance Patient limited by fatigue;Other (comment) (Limited due to nausea and feeling hot.)   Patient Left in bed;with nursing/sitter in room;with call bell/phone within reach   Nurse Communication Mobility status;Other (comment) (Pt request for pure wick)        Time: 5102-5852 OT  Time Calculation (min): 34 min  Charges: OT Treatments $Self Care/Home Management : 8-22 mins $Therapeutic Activity: 8-22 mins  Anderson [place], OT Acute Rehab Services Office: [phone] 08/14/2019   Julien Girt 08/14/2019, 12:46 PM

## 2019-08-15 LAB — GLUCOSE, CAPILLARY
Glucose-Capillary: 112 mg/dL — ABNORMAL HIGH (ref 70–99)
Glucose-Capillary: 150 mg/dL — ABNORMAL HIGH (ref 70–99)

## 2019-08-15 NOTE — Progress Notes (Signed)
PROGRESS NOTE    Alexis Grant  QMV:[id number] DOB: 1950/06/03 DOA: 08/11/2019 PCP: Binnie Rail, MD   Brief Narrative: Alexis Grant a 69 y.o.femalewith medical history significant for OSA on CPAP, HLD, DM2, HTN, seizure disorder, obesity, fibromyalgia, GERD Who presents to the ED withnausea, vomiting, diarrhea since Monday for the past 6 days, she has not been able to tolerate her medications. EMS gave 4 mg of Zofran on their arrival, heart rate 69, blood glucose 159, blood pressure 133/92, reports dry cough and feeling hot. she has been trying to drink Pedialyte, no blood in stools, no sick contacts, patient is vaccinated for Covid. Noted some leg swelling, abdominal pain, Noted weight gain up to 40 lb. Severe leg swelling her PCP put her on Lasix fo r 3 days but it did not help with the swelling.  Patient was admitted for dehydration secondary to nausea,vomiting and diarrhea. CT abdomen is unremarkable, GI panel and C. difficile negative.Blood cultures,  urine culture negative.  She was continued on IV fluids.  She has improved significantly felt better and wants to be discharged.  She also has bilateral leg edema venous duplex was completed, negative for DVT.  Echocardiogram was completed which was unremarkable EF was 55 to 60%.  All other home medications were continued.  PT recommended home with home physical therapy.  Patient is being discharged home with home services.  Patient's family was not willing to take patient's home yesterday because of unavailability of DME equipments.  Assessment & Plan:   Active Problems:   Hypothyroidism   Dyslipidemia   Essential hypertension   Irritable bowel syndrome   DM (diabetes mellitus), type 2 with peripheral vascular complications (HCC)   Generalized convulsive epilepsy (HCC)   Fibromyalgia   GERD (gastroesophageal reflux disease)   Bilateral leg edema   Nausea, vomiting, diarrhea:- >>> Improved.  unclear etiology, this  could be gastroenteritis, GI panel and C. difficile negative. CT abdomen unremarkable which is reassuring.  Dehydration - Resolved. versus intravascular depletion,  patient with nausea vomiting decreased p.o. intake and diarrhea for the past 1 week. If hemoconcentration and concentrated urine.Given third spacing will very gently rehydrate and follow fluid status  . Bilateral leg edema unclear etiology in the past patient was told that she has fluid around her heart,  her PCP has tried on Lasix which did not seem to help.  Will obtain echogram and leg Dopplers as her right lower extremity slightly more swollen than her left    Patient endorses dyspnea with exertion which is chronic but been getting worse as well as leg edema worsening and weight gain.  Echocardiogram unremarkable, EF 55 to 60%.  Lower extremity renal duplex: no evidence of DVT.   . DM (diabetes mellitus), type 2 with peripheral vascular complications (HCC) - - Order Sensitive  SSI   -  check TSH and HgA1C  - Hold by mouth medications     . Dyslipidemia chronic, stable. continue home medications  . Essential hypertension : Resume home blood pressure medications.  . Fibromyalgia/neuropathy.  Hold Neurontin as this can increase leg edema may need to see if no other cause for lower extremity edema-trial off of gabapentin with help.  [name] GERD (gastroesophageal reflux disease) chronic stable. continue home medications  . Generalized convulsive epilepsy ([place]) patient off of Depakote for the past 2 days, will restart and follow Depakote level nontoxic  . Hypothyroidism -chronic stable. continue home medications  . Irritable bowel syndrome could be contributing to  nausea vomiting diarrhea    DVT prophylaxis: Lovenox Code Status: Full Family Communication: Non one at bed side, d/w patient Disposition Plan: Patient is being discharged home with home services.. Consultants:   None  Procedures:  None.  Antimicrobials: Anti-infectives (From admission, onward)   None      Subjective: Patient was seen and examined at bedside.  No overnight events.  Patient reports feeling much better.  Nausea, vomiting, diarrhea has completely resolved,  she feels more stronger.  She is excited to be going home.   Objective: Vitals:   08/14/19 0537 08/14/19 1400 08/14/19 2026 08/15/19 0544  BP: (!) 140/70 (!) 140/73 (!) 159/72 (!) 145/59  Pulse: 55 48 56 52  Resp: 17 16 17 17   Temp: 98.9 F (37.2 C) 98.2 F (36.8 C) 99.8 F (37.7 C) 98.7 F (37.1 C)  TempSrc: Oral Oral    SpO2: 95% 99% 98% 98%  Weight:      Height:        Intake/Output Summary (Last 24 hours) at 08/15/2019 1039 Last data filed at 08/14/2019 2059 Gross per 24 hour  Intake 240 ml  Output 650 ml  Net -410 ml   Filed Weights   08/11/19 1653  Weight: (!) 136.1 kg    Examination:  General exam: Appears calm and comfortable  Respiratory system: Clear to auscultation. Respiratory effort normal. Cardiovascular system: S1 & S2 heard, RRR. No JVD, murmurs, rubs, gallops or clicks. No pedal edema. Gastrointestinal system: Abdomen is nondistended, soft and nontender. No organomegaly or masses felt. Normal bowel sounds heard. Central nervous system: Alert and oriented. No focal neurological deficits. Extremities: Symmetric 5 x 5 power. Skin: No rashes, lesions or ulcers Psychiatry: Judgement and insight appear normal. Mood & affect appropriate.     Data Reviewed: I have personally reviewed following labs and imaging studies  CBC: Recent Labs  Lab 08/11/19 1651 08/12/19 0355 08/13/19 0600 08/14/19 0407  WBC 8.2 9.0 8.5 6.5  NEUTROABS 5.8 5.5  --   --   HGB 12.4 10.4* 10.7* 9.9*  HCT 39.7 33.4* 34.1* 33.2*  MCV 86.1 85.9 85.5 90.2  PLT 231 177 161 384*   Basic Metabolic Panel: Recent Labs  Lab 08/11/19 1651 08/12/19 0355 08/13/19 0600 08/14/19 0407  NA 141 140 137 136  K 3.3* 3.6 3.3* 3.4*  CL 108  109 106 107  CO2 24 23 23 22   GLUCOSE 154* 109* 113* 104*  BUN 8 6* 6* 8  CREATININE 0.62 0.47 0.50 0.45  CALCIUM 9.0 8.4* 8.4* 8.0*  MG 2.2 1.9 1.9  --   PHOS  --  3.3  --   --    GFR: Estimated Creatinine Clearance: 91.3 mL/min (by C-G formula based on SCr of 0.45 mg/dL). Liver Function Tests: Recent Labs  Lab 08/11/19 1651 08/12/19 0355  AST 11* 9*  ALT 9 8  ALKPHOS 49 37*  BILITOT 0.4 0.3  PROT 6.9 5.7*  ALBUMIN 3.6 3.0*   Recent Labs  Lab 08/11/19 1651  LIPASE 20   No results for input(s): AMMONIA in the last 168 hours. Coagulation Profile: No results for input(s): INR, PROTIME in the last 168 hours. Cardiac Enzymes: No results for input(s): CKTOTAL, CKMB, CKMBINDEX, TROPONINI in the last 168 hours. BNP (last 3 results) Recent Labs    06/01/19 1152  PROBNP 110.0*   HbA1C: No results for input(s): HGBA1C in the last 72 hours. CBG: Recent Labs  Lab 08/14/19 0756 08/14/19 1137 08/14/19 1551 08/14/19 2024 08/15/19  0805  GLUCAP 103* 137* 115* 109* 112*   Lipid Profile: No results for input(s): CHOL, HDL, LDLCALC, TRIG, CHOLHDL, LDLDIRECT in the last 72 hours. Thyroid Function Tests: No results for input(s): TSH, T4TOTAL, FREET4, T3FREE, THYROIDAB in the last 72 hours. Anemia Panel: No results for input(s): VITAMINB12, FOLATE, FERRITIN, TIBC, IRON, RETICCTPCT in the last 72 hours. Sepsis Labs: Recent Labs  Lab 08/11/19 1651  LATICACIDVEN 1.3    Recent Results (from the past 240 hour(s))  Culture, blood (routine x 2)     Status: None (Preliminary result)   Collection Time: 08/11/19  4:51 PM   Specimen: BLOOD  Result Value Ref Range Status   Specimen Description   Final    BLOOD RIGHT ANTECUBITAL Performed at [facility] [address], [place], [place] [address]    Special Requests   Final    BOTTLES DRAWN AEROBIC AND ANAEROBIC Blood Culture results may not be optimal due to an excessive volume of blood received in culture  bottles Performed at [facility] [address], [place], [place] [address]    Culture   Final    NO GROWTH 3 DAYS Performed at [facility], [place] [address] [place], [place] [address]    Report Status PENDING  Incomplete  Gastrointestinal Panel by PCR , Stool     Status: None   Collection Time: 08/11/19  4:53 PM   Specimen: Stool  Result Value Ref Range Status   Campylobacter species NOT DETECTED NOT DETECTED Final   Plesimonas shigelloides NOT DETECTED NOT DETECTED Final   Salmonella species NOT DETECTED NOT DETECTED Final   Yersinia enterocolitica NOT DETECTED NOT DETECTED Final   Vibrio species NOT DETECTED NOT DETECTED Final   Vibrio cholerae NOT DETECTED NOT DETECTED Final   Enteroaggregative E coli (EAEC) NOT DETECTED NOT DETECTED Final   Enteropathogenic E coli (EPEC) NOT DETECTED NOT DETECTED Final   Enterotoxigenic E coli (ETEC) NOT DETECTED NOT DETECTED Final   Shiga like toxin producing E coli (STEC) NOT DETECTED NOT DETECTED Final   Shigella/Enteroinvasive E coli (EIEC) NOT DETECTED NOT DETECTED Final   Cryptosporidium NOT DETECTED NOT DETECTED Final   Cyclospora cayetanensis NOT DETECTED NOT DETECTED Final   Entamoeba histolytica NOT DETECTED NOT DETECTED Final   Giardia lamblia NOT DETECTED NOT DETECTED Final   Adenovirus F40/41 NOT DETECTED NOT DETECTED Final   Astrovirus NOT DETECTED NOT DETECTED Final   Norovirus GI/GII NOT DETECTED NOT DETECTED Final   Rotavirus A NOT DETECTED NOT DETECTED Final   Sapovirus (I, II, IV, and V) NOT DETECTED NOT DETECTED Final    Comment: Performed at [facility], [place] [place], [place] [address]  C Difficile Quick Screen w PCR reflex     Status: None   Collection Time: 08/11/19  4:53 PM   Specimen: Stool  Result Value Ref Range Status   C Diff antigen NEGATIVE NEGATIVE Final   C Diff toxin NEGATIVE NEGATIVE Final   C Diff interpretation No C. difficile detected.  Final     Comment: Performed at [facility], [place] [address], [place], [place] [address]  Culture, blood (routine x 2)     Status: None (Preliminary result)   Collection Time: 08/11/19  4:56 PM   Specimen: BLOOD  Result Value Ref Range Status   Specimen Description   Final    BLOOD LEFT ANTECUBITAL Performed at [facility] [address], [place],  [address]  Special Requests   Final    BOTTLES DRAWN AEROBIC AND ANAEROBIC Blood Culture results may not be optimal due to an excessive volume of blood received in culture bottles Performed at [facility] [address], [place], [place] [address]    Culture   Final    NO GROWTH 3 DAYS Performed at [facility], [place] [address], [place], [place] [address]    Report Status PENDING  Incomplete  SARS Coronavirus 2 by RT PCR (hospital order, performed in [facility] hospital lab) Nasopharyngeal Nasopharyngeal Swab     Status: None   Collection Time: 08/11/19  5:01 PM   Specimen: Nasopharyngeal Swab  Result Value Ref Range Status   SARS Coronavirus 2 NEGATIVE NEGATIVE Final    Comment: (NOTE) SARS-CoV-2 target nucleic acids are NOT DETECTED.  The SARS-CoV-2 RNA is generally detectable in upper and lower respiratory specimens during the acute phase of infection. The lowest concentration of SARS-CoV-2 viral copies this assay can detect is 250 copies / mL. A negative result does not preclude SARS-CoV-2 infection and should not be used as the sole basis for treatment or other patient management decisions.  A negative result may occur with improper specimen collection / handling, submission of specimen other than nasopharyngeal swab, presence of viral mutation(s) within the areas targeted by this assay, and inadequate number of viral copies (<250 copies / mL). A negative result must be combined with clinical observations, patient history, and epidemiological information.  Fact  Sheet for Patients:   StrictlyIdeas.no  Fact Sheet for Healthcare Providers: BankingDealers.co.za  This test is not yet approved or  cleared by the [place] FDA and has been authorized for detection and/or diagnosis of SARS-CoV-2 by FDA under an Emergency Use Authorization (EUA).  This EUA will remain in effect (meaning this test can be used) for the duration of the COVID-19 declaration under Section 564(b)(1) of the Act, 21 U.S.C. section 360bbb-3(b)(1), unless the authorization is terminated or revoked sooner.  Performed at [facility], [place] [address], [place], [place] [address]   Urine culture     Status: None   Collection Time: 08/11/19  9:28 PM   Specimen: Urine, Random  Result Value Ref Range Status   Specimen Description   Final    URINE, RANDOM Performed at [facility] [address] [place], [place] [address]    Special Requests   Final    NONE Performed at [facility], [place] [address] [place], [place] [address]    Culture   Final    NO GROWTH Performed at [facility], [place] [address], [place], [place] [address]    Report Status 08/13/2019 FINAL  Final     Radiology Studies: No results found.  Scheduled Meds: . divalproex  500 mg Oral BID  . docusate sodium  100 mg Oral BID  . enoxaparin (LOVENOX) injection  60 mg Subcutaneous Q24H  . insulin aspart  0-9 Units Subcutaneous Q4H  . levothyroxine  75 mcg Oral Q0600  . oxybutynin  15 mg Oral QHS  . pantoprazole  40 mg Oral Daily  . Ensure Max Protein  11 oz Oral Daily  . sertraline  150 mg Oral Daily  . simvastatin  20 mg Oral q1800  . sodium chloride flush  3 mL Intravenous Q12H  . topiramate  100 mg Oral BID  . traZODone  100 mg Oral QHS   Continuous Infusions: . sodium chloride 75  mL/hr at 08/13/19 2255     LOS: 3 days    Time spent: 25 mins.   Shawna Clamp, MD [facility]   If 7PM-7AM, please contact night-coverage

## 2019-08-15 NOTE — Progress Notes (Signed)
Went over discharge instructions w/ pt. Pt verbalized understanding and her son will be here around 1600 to pick her up.

## 2019-08-16 ENCOUNTER — Telehealth: Payer: Self-pay

## 2019-08-16 LAB — CULTURE, BLOOD (ROUTINE X 2)
Culture: NO GROWTH
Culture: NO GROWTH

## 2019-08-16 MED ORDER — OXYBUTYNIN CHLORIDE ER 15 MG PO TB24: 15.0000 mg | ORAL_TABLET | Freq: Every day | ORAL | 2 refills | Status: DC

## 2019-08-16 NOTE — Telephone Encounter (Signed)
Called pt concerning hosp d/c. Completed TCM below.../lmb  Transition Care Management Follow-up Telephone Call   Date discharged? 08/15/19   How have you been since you were released from the hospital? Pt states he is doing ok. Just taking one day at a time   Do you understand why you were in the hospital? YES   Do you understand the discharge instructions? YES   Where were you discharged to? Home   Items Reviewed:  Medications reviewed: YES, pt states she was advise to take 1 furosemide a day, and she need a refill. Also need a refill on her Oxybutrin sent to [facility]. Also she stated they took her off the Tylenol # 3 she is wanting to know what she can take instead.   Allergies reviewed: YES  Dietary changes reviewed: YES, heart healthy  Referrals reviewed: YES   Functional Questionnaire:   Activities of Daily Living (ADLs):   She states she are independent in the following: feeding, grooming and toileting States they require assistance with the following: ambulation, bathing and hygiene, feeding and dressing she states they sent her home w/ a Rolator walker, and tub bench   Any transportation issues/concerns?: NO   Any patient concerns? NO   Confirmed importance and date/time of follow-up visits scheduled YES, appt 08/22/19  Provider Appointment booked with Dr. Quay Burow  Confirmed with patient if condition begins to worsen call PCP or go to the ER.  Patient was given the office number and encouraged to call back with question or concerns.  : YES

## 2019-08-16 NOTE — Telephone Encounter (Signed)
Pt has made hosp f/u for 08/22/19, but she states they took her off the Tylenol # 3 and she is wanting to know what she can take instead. Also she was advise to take the furosemide 1 a day, but on med list it states d/c by provider. Pls advise on what pt need to do.Marland KitchenJohny Chess

## 2019-08-16 NOTE — Telephone Encounter (Signed)
New message    The patient has a question regarding medication took off Tylenol 3 for the pain nothing was given in replacement was prescribe furosemide (LASIX) 20 MG tablet.   [id number] [place], [place]

## 2019-08-17 NOTE — Telephone Encounter (Signed)
Spoke with patient and info given 

## 2019-08-17 NOTE — Telephone Encounter (Signed)
I am not sure why they stopped the Tylenol 3.  I think it is okay for her to restart and we can discuss at her upcoming appointment.  She should be taking Lasix 20 mg daily.

## 2019-08-20 DIAGNOSIS — K449 Diaphragmatic hernia without obstruction or gangrene: Secondary | ICD-10-CM | POA: Diagnosis not present

## 2019-08-20 DIAGNOSIS — E1151 Type 2 diabetes mellitus with diabetic peripheral angiopathy without gangrene: Secondary | ICD-10-CM | POA: Diagnosis not present

## 2019-08-20 DIAGNOSIS — G4733 Obstructive sleep apnea (adult) (pediatric): Secondary | ICD-10-CM | POA: Diagnosis not present

## 2019-08-20 DIAGNOSIS — E1142 Type 2 diabetes mellitus with diabetic polyneuropathy: Secondary | ICD-10-CM | POA: Diagnosis not present

## 2019-08-20 DIAGNOSIS — M8580 Other specified disorders of bone density and structure, unspecified site: Secondary | ICD-10-CM | POA: Diagnosis not present

## 2019-08-20 DIAGNOSIS — I700 Atherosclerosis of aorta: Secondary | ICD-10-CM | POA: Diagnosis not present

## 2019-08-20 DIAGNOSIS — I252 Old myocardial infarction: Secondary | ICD-10-CM | POA: Diagnosis not present

## 2019-08-20 DIAGNOSIS — F419 Anxiety disorder, unspecified: Secondary | ICD-10-CM | POA: Diagnosis not present

## 2019-08-20 DIAGNOSIS — M797 Fibromyalgia: Secondary | ICD-10-CM | POA: Diagnosis not present

## 2019-08-20 DIAGNOSIS — M160 Bilateral primary osteoarthritis of hip: Secondary | ICD-10-CM | POA: Diagnosis not present

## 2019-08-20 DIAGNOSIS — I10 Essential (primary) hypertension: Secondary | ICD-10-CM | POA: Diagnosis not present

## 2019-08-20 DIAGNOSIS — R251 Tremor, unspecified: Secondary | ICD-10-CM | POA: Diagnosis not present

## 2019-08-20 DIAGNOSIS — R600 Localized edema: Secondary | ICD-10-CM | POA: Diagnosis not present

## 2019-08-20 DIAGNOSIS — E669 Obesity, unspecified: Secondary | ICD-10-CM | POA: Diagnosis not present

## 2019-08-20 DIAGNOSIS — N3281 Overactive bladder: Secondary | ICD-10-CM | POA: Diagnosis not present

## 2019-08-20 DIAGNOSIS — E875 Hyperkalemia: Secondary | ICD-10-CM | POA: Diagnosis not present

## 2019-08-20 DIAGNOSIS — R5383 Other fatigue: Secondary | ICD-10-CM | POA: Diagnosis not present

## 2019-08-20 DIAGNOSIS — K760 Fatty (change of) liver, not elsewhere classified: Secondary | ICD-10-CM | POA: Diagnosis not present

## 2019-08-20 DIAGNOSIS — F329 Major depressive disorder, single episode, unspecified: Secondary | ICD-10-CM | POA: Diagnosis not present

## 2019-08-20 DIAGNOSIS — E039 Hypothyroidism, unspecified: Secondary | ICD-10-CM | POA: Diagnosis not present

## 2019-08-20 DIAGNOSIS — I051 Rheumatic mitral insufficiency: Secondary | ICD-10-CM | POA: Diagnosis not present

## 2019-08-20 DIAGNOSIS — D649 Anemia, unspecified: Secondary | ICD-10-CM | POA: Diagnosis not present

## 2019-08-20 DIAGNOSIS — G40009 Localization-related (focal) (partial) idiopathic epilepsy and epileptic syndromes with seizures of localized onset, not intractable, without status epilepticus: Secondary | ICD-10-CM | POA: Diagnosis not present

## 2019-08-20 DIAGNOSIS — G43109 Migraine with aura, not intractable, without status migrainosus: Secondary | ICD-10-CM | POA: Diagnosis not present

## 2019-08-20 DIAGNOSIS — K589 Irritable bowel syndrome without diarrhea: Secondary | ICD-10-CM | POA: Diagnosis not present

## 2019-08-21 DIAGNOSIS — E1142 Type 2 diabetes mellitus with diabetic polyneuropathy: Secondary | ICD-10-CM | POA: Diagnosis not present

## 2019-08-21 DIAGNOSIS — R600 Localized edema: Secondary | ICD-10-CM | POA: Diagnosis not present

## 2019-08-21 DIAGNOSIS — K589 Irritable bowel syndrome without diarrhea: Secondary | ICD-10-CM | POA: Diagnosis not present

## 2019-08-21 DIAGNOSIS — R5383 Other fatigue: Secondary | ICD-10-CM | POA: Diagnosis not present

## 2019-08-21 DIAGNOSIS — G40009 Localization-related (focal) (partial) idiopathic epilepsy and epileptic syndromes with seizures of localized onset, not intractable, without status epilepticus: Secondary | ICD-10-CM | POA: Diagnosis not present

## 2019-08-21 DIAGNOSIS — I10 Essential (primary) hypertension: Secondary | ICD-10-CM | POA: Diagnosis not present

## 2019-08-21 NOTE — Progress Notes (Signed)
Subjective:    Patient ID: Alexis Grant, female    DOB: Sep 24, 1950, 69 y.o.   MRN: [id number]  HPI The patient is here for follow up from the hospital   Admitted 08/11/19 - 08/15/19.   Went to ED with N/V, dairrhea for 6 days.  She was not tolerating meds.  She had leg swelling, abdominal pain, weight gain.  She denied blood in stool.    CT of Ab/P was unremarkable.  GI panel and cdiff neg.  B/l leg [place] neg for DVT.  Echo showed EF 55-60%.  Improved with IVF.  PT recommended home PT.  N/V, diarrhea: Unclear cause ? Gastroenteritis GI panel, cdiff neg Ct Ab/P unremarkable  Dehydration: Resolved with IVF  B/l Leg edema: Unclear etiology Did not help with oral lasix Echo showed normal EF [place] of legs neg for DVT  DM: SSI Continue home meds on dc  Dyslipidemia, htn, hypothyroidism: Stable Home meds  Fibromyalgia/neuropathy: Gabapentin held since it may contribute to edema  Doing home PT    Diarrhea;  She still has a loose stool.  She denies not being able to make it to the bathroom.  It is slowly getting better.  Her appetite is very slowly improving.  She is eating jello, fruit cups, crackers.  She is drinking a lot of water and Gatorade.    Leg swelling:  Her foot/leg swelling is good right now.  She is unsure if this she did have her legs elevated consistently while in the hospital, which may have helped.  She has been eating very bland, low-sodium diet.  She has not been eating much at all.  She is interested in taking the Lasix daily to see if that prevents her legs from getting swollen again.  Medications and allergies reviewed with patient and updated if appropriate.  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 with seizures of localized onset, not intractable, without status epilepticus ([place]) 12/04/2014   Hx of adenomatous colonic polyps 07/16/2014   Benign neoplasm of ascending colon 07/16/2014   Benign neoplasm of descending colon 07/16/2014   Benign neoplasm of cecum 07/16/2014   Benign neoplasm of transverse colon 07/16/2014   Fibromyalgia 05/30/2013   Generalized convulsive epilepsy ([place]) 10/20/2012   Seasonal allergies 12/08/2011   DM (diabetes mellitus), type 2 with peripheral vascular complications ([place]) 17/40/8144   Essential hypertension 01/05/2010   Irritable bowel syndrome 01/05/2010   VERTIGO 09/16/2009   VITAMIN D DEFICIENCY 02/24/2009   MYOCARDIAL INFARCTION, ACUTE, SUBENDOCARDIAL 10/04/2008   Hypothyroidism 03/19/2008   COLONIC POLYPS, ADENOMATOUS, HX OF 08/07/2007   HEMANGIOMA 04/26/2007   Dyslipidemia 04/26/2007   OBESITY 04/26/2007   Depression 04/26/2007   SLEEP APNEA, OBSTRUCTIVE 04/26/2007   HIATAL HERNIA 04/26/2007   FATTY LIVER DISEASE 04/26/2007    Current Outpatient Medications on File Prior to Visit  Medication Sig Dispense Refill   acetaminophen (TYLENOL) 500 MG tablet Take 500 mg by mouth  every 6 (six) hours as needed.     blood glucose meter kit and supplies KIT Dispense based on patient and insurance preference. Use up to four times daily as directed. (FOR E11.9). 1 each 0   divalproex (DEPAKOTE) 500 MG DR tablet Take 1 tab twice a day (Patient taking differently: Take 500 mg by mouth 2 (two) times daily. Take 1 tab twice a day) 180 tablet 3   furosemide (LASIX) 20 MG  tablet Take 1 tablet (20 mg total) by mouth daily. 3 tablet 0   gabapentin (NEURONTIN) 300 MG capsule Take 2 caps every night (Patient taking differently: Take 600 mg by mouth at bedtime. ) 180 capsule 3   glipiZIDE (GLUCOTROL) 10 MG tablet TAKE 1 TABLET (10 MG TOTAL) BY MOUTH 2 (TWO) TIMES DAILY BEFORE A MEAL. 180 tablet 1   levothyroxine (SYNTHROID) 75 MCG tablet TAKE 1 TABLET BY MOUTH ONCE DAILY BEFORE BREAKFAST (Patient taking differently: Take 75 mcg by mouth daily before breakfast. Take 1 tablet by mouth once daily before breakfast.) 90 tablet 1   loperamide (IMODIUM) 2 MG capsule Take 1 capsule (2 mg total) by mouth 4 (four) times daily as needed for diarrhea or loose stools. 12 capsule 0   losartan (COZAAR) 50 MG tablet TAKE 1 TABLET BY MOUTH EVERY DAY (Patient taking differently: Take 50 mg by mouth daily. ) 90 tablet 1   metFORMIN (GLUCOPHAGE) 1000 MG tablet Take 1 tablet (1,000 mg total) by mouth 2 (two) times daily with a meal. 360 tablet 1   omeprazole (PRILOSEC) 40 MG capsule TAKE 1 CAPSULE BY MOUTH EVERY DAY (Patient taking differently: Take 40 mg by mouth daily. ) 90 capsule 1   ondansetron (ZOFRAN-ODT) 4 MG disintegrating tablet Take 1 tablet (4 mg total) by mouth every 8 (eight) hours as needed for nausea or vomiting. 20 tablet 0   oxybutynin (DITROPAN XL) 15 MG 24 hr tablet Take 1 tablet (15 mg total) by mouth at bedtime. 30 tablet 2   sertraline (ZOLOFT) 100 MG tablet Take 1.5 tablets (150 mg total) by mouth daily. 135 tablet 1   simvastatin (ZOCOR) 20 MG tablet TAKE 1 TABLET BY MOUTH EVERYDAY AT BEDTIME (Patient taking differently: Take 20 mg by mouth daily at 6 PM. ) 90 tablet 1   topiramate (TOPAMAX) 100 MG tablet Take 1 tablet (100 mg total) by mouth 2 (two) times daily. 180 tablet 3   traZODone (DESYREL) 50 MG tablet TAKE 2 TABLETS BY MOUTH AT BEDTIME FOR SLEEP (Patient taking differently: Take 100 mg by mouth at bedtime. TAKE 2 TABLETS BY MOUTH AT BEDTIME FOR  SLEEP) 180 tablet 0   No current facility-administered medications on file prior to visit.    Past Medical History:  Diagnosis Date   Adenomatous polyp of colon 2008   Allergy    Anemia    Anxiety    Arthritis    Blood transfusion without reported diagnosis    following knee surgery    Cataract    hx- removed both eyes   Depression    Diabetes mellitus    type II    Dyslipidemia    Fibromyalgia    Gallstones    Gastritis    GERD (gastroesophageal reflux disease)    Hemangioma    Hiatal hernia    Fibromyalgia   Hyperlipidemia    Hypertension    Hypothyroidism 2010   Dr.Deveshawr   IBS (irritable bowel syndrome)    Dr Fuller Plan   Migraines  Myocardial infarct ([place]) 2010   subendocardrial, following R TKR   Obesity    OSA (obstructive sleep apnea)    cpap - does not know settings    Pneumonia    Seizure disorder ([place])    Seizures ([place])    last seizure was 2002 per pt 07-29-16   Sleep apnea    Urinary tract infection     Past Surgical History:  Procedure Laterality Date   ABDOMINAL HYSTERECTOMY  1988   CARDIAC CATHETERIZATION  2007   DrGamble ( now seeing Dr Tonita Cong cardiology)   [place]   COLONOSCOPY     COLONOSCOPY WITH PROPOFOL N/A 07/16/2014   Procedure: COLONOSCOPY WITH PROPOFOL;  Surgeon: Ladene Artist, MD;  Location: [facility];  Service: Endoscopy;  Laterality: N/A;   COLONOSCOPY WITH PROPOFOL N/A 09/21/2016   Procedure: COLONOSCOPY WITH PROPOFOL;  Surgeon: Ladene Artist, MD;  Location: [facility];  Service: Endoscopy;  Laterality: N/A;   POLYPECTOMY  2012   6 or 7 adenomatous, Dr.Stark   steroid injection to left si joint  12/2008   Dr.Newton    TOTAL KNEE ARTHROPLASTY Right 11/2005   TOTAL KNEE ARTHROPLASTY Left    TUBAL LIGATION  1986    Social History   Socioeconomic History   Marital status: Widowed    Spouse name: Not on file   Number of children: 3   Years of education:  college   Highest education level: Not on file  Occupational History   Occupation: Retired  Tobacco Use   Smoking status: Never Smoker   Smokeless tobacco: Never Used  Scientific laboratory technician Use: Never used  Substance and Sexual Activity   Alcohol use: No    Alcohol/week: 0.0 standard drinks   Drug use: No   Sexual activity: Not Currently  Other Topics Concern   Not on file  Social History Narrative   Illicit drug use- no   Patient does not get regular exercise due to knee.   Patient lives at home alone.    Patient son and wife lives next door.   Patient has 3 children.    Patient has some college.    Patient retired May 2014.    Social Determinants of Health   Financial Resource Strain:    Difficulty of Paying Living Expenses:   Food Insecurity:    Worried About Charity fundraiser in the Last Year:    Arboriculturist in the Last Year:   Transportation Needs:    Film/video editor (Medical):    Lack of Transportation (Non-Medical):   Physical Activity: Insufficiently Active   Days of Exercise per Week: 5 days   Minutes of Exercise per Session: 20 min  Stress:    Feeling of Stress :   Social Connections:    Frequency of Communication with Friends and Family:    Frequency of Social Gatherings with Friends and Family:    Attends Religious Services:    Active Member of Clubs or Organizations:    Attends Music therapist:    Marital Status:     Family History  Problem Relation Age of Onset   Colon polyps Mother    Ulcers Father    Colon polyps Brother    Diabetes Sister    Kidney failure Sister    Diabetes Paternal Aunt    Esophageal cancer Brother    Kidney cancer Sister    Renal cancer Sister    Colon cancer Neg  Hx    Heart attack Neg Hx    Stroke Neg Hx    Cancer Neg Hx    Gallbladder disease Neg Hx    Rectal cancer Neg Hx    Stomach cancer Neg Hx     Review of Systems  Constitutional: Positive  for appetite change (decreased) and fatigue. Negative for chills and fever.  Gastrointestinal: Positive for abdominal pain (cramping periodically). Negative for blood in stool, diarrhea (loose stools), nausea and vomiting.       No gerd  Neurological: Positive for headaches. Negative for light-headedness.       Objective:   Vitals:   08/22/19 1327  BP: 128/84  Pulse: (!) 104  Temp: 98.2 F (36.8 C)  SpO2: 95%   BP Readings from Last 3 Encounters:  08/22/19 128/84  08/15/19 (!) 157/69  06/01/19 128/74   Wt Readings from Last 3 Encounters:  08/22/19 (!) 303 lb (137.4 kg)  08/11/19 (!) 300 lb (136.1 kg)  06/01/19 (!) 316 lb (143.3 kg)   Body mass index is 53.67 kg/m.   Physical Exam    Constitutional: Appears well-developed and well-nourished. No distress.  HENT:  Head: Normocephalic and atraumatic.  Neck: Neck supple. No tracheal deviation present. No thyromegaly present.  No cervical lymphadenopathy Cardiovascular: Normal rate, regular rhythm and normal heart sounds.   No murmur heard. No carotid bruit .  No edema Pulmonary/Chest: Effort normal and breath sounds normal. No respiratory distress. No has no wheezes. No rales.  Abdomen: soft, obese, NT, ND Skin: Skin is warm and dry. Not diaphoretic.  Psychiatric: Normal mood and affect. Behavior is normal.    Lab Results  Component Value Date   WBC 6.5 08/14/2019   HGB 9.9 (L) 08/14/2019   HCT 33.2 (L) 08/14/2019   PLT 145 (L) 08/14/2019   GLUCOSE 104 (H) 08/14/2019   CHOL 158 06/01/2019   TRIG 130.0 06/01/2019   HDL 50.80 06/01/2019   LDLCALC 81 06/01/2019   ALT 8 08/12/2019   AST 9 (L) 08/12/2019   NA 136 08/14/2019   K 3.4 (L) 08/14/2019   CL 107 08/14/2019   CREATININE 0.45 08/14/2019   BUN 8 08/14/2019   CO2 22 08/14/2019   TSH 1.841 08/12/2019   INR 1.17 11/09/2008   HGBA1C 5.8 (H) 08/12/2019   MICROALBUR 4.2 (H) 11/25/2015    ECHOCARDIOGRAM COMPLETE    ECHOCARDIOGRAM REPORT       Patient  Name:   Alexis Grant Date of Exam: 08/12/2019 Medical Rec #:  [id number]       Height:       63.0 in Accession #:    [id number]      Weight:       300.0 lb Date of Birth:  1950-03-11      BSA:          2.297 m Patient Age:    65 years        BP:           115/54 mmHg Patient Gender: F               HR:           62 bpm. Exam Location:  Inpatient  Procedure: 2D Echo, Cardiac Doppler, Color Doppler and Intracardiac            Opacification Agent  Indications:    R06.02 SOB   History:        Patient has prior history of Echocardiogram examinations, most  recent 04/20/2013.   Sonographer:    Merrie Roof RDCS Referring Phys: [place]   1. Left ventricular ejection fraction, by estimation, is 55 to 60%. The left ventricle has normal function. The left ventricle has no regional wall motion abnormalities. Left ventricular diastolic parameters were normal.  2. Right ventricular systolic function is normal. The right ventricular size is normal. Tricuspid regurgitation signal is inadequate for assessing PA pressure.  3. The mitral valve is grossly normal. Trivial mitral valve regurgitation.  4. The aortic valve is tricuspid. Aortic valve regurgitation is not visualized.  5. The inferior vena cava is dilated in size with <50% respiratory variability, suggesting right atrial pressure of 15 mmHg.  FINDINGS  Left Ventricle: Left ventricular ejection fraction, by estimation, is 55 to 60%. The left ventricle has normal function. The left ventricle has no regional wall motion abnormalities. Definity contrast agent was given IV to delineate the left ventricular  endocardial borders. The left ventricular internal cavity size was normal in size. There is no left ventricular hypertrophy. Left ventricular diastolic parameters were normal.  Right Ventricle: The right ventricular size is normal. No increase in right ventricular wall thickness. Right ventricular  systolic function is normal. Tricuspid regurgitation signal is inadequate for assessing PA pressure.  Left Atrium: Left atrial size was normal in size.  Right Atrium: Right atrial size was normal in size.  Pericardium: Trivial pericardial effusion is present. The pericardial effusion is posterior to the left ventricle.  Mitral Valve: The mitral valve is grossly normal. Trivial mitral valve regurgitation.  Tricuspid Valve: The tricuspid valve is grossly normal. Tricuspid valve regurgitation is not demonstrated.  Aortic Valve: The aortic valve is tricuspid. Aortic valve regurgitation is not visualized.  Pulmonic Valve: The pulmonic valve was normal in structure. Pulmonic valve regurgitation is not visualized.  Aorta: The aortic root and ascending aorta are structurally normal, with no evidence of dilitation.  Venous: The inferior vena cava is dilated in size with less than 50% respiratory variability, suggesting right atrial pressure of 15 mmHg.  IAS/Shunts: No atrial level shunt detected by color flow Doppler.    LEFT VENTRICLE PLAX 2D LVIDd:         5.40 cm      Diastology LVIDs:         3.90 cm      LV e' lateral: 9.57 cm/s LV PW:         0.90 cm      LV e' medial:  8.16 cm/s LV IVS:        0.90 cm LVOT diam:     2.00 cm LVOT Area:     3.14 cm   LV Volumes (MOD) LV vol d, MOD A2C: 125.0 ml LV vol d, MOD A4C: 145.0 ml LV vol s, MOD A2C: 54.9 ml LV vol s, MOD A4C: 73.7 ml LV SV MOD A2C:     70.1 ml LV SV MOD A4C:     145.0 ml LV SV MOD BP:      70.7 ml  RIGHT VENTRICLE             IVC RV Basal diam:  3.10 cm     IVC diam: 2.20 cm RV S prime:     13.90 cm/s TAPSE (M-mode): 2.5 cm  LEFT ATRIUM              Index LA diam:        4.10 cm  1.78 cm/m LA Vol (A2C):  100.0 ml 43.53 ml/m LA Vol (A4C):   72.9 ml  31.73 ml/m LA Biplane Vol: 84.9 ml  36.96 ml/m    AORTA Ao Root diam: 2.80 cm Ao Asc diam:  3.10 cm    SHUNTS Systemic Diam: 2.00 cm  Lyman Bishop  MD Electronically signed by Lyman Bishop MD Signature Date/Time: 08/12/2019/12:55:50 PM      Final   VAS [place] LOWER EXTREMITY VENOUS (DVT)  Lower Venous DVT Study  Indications: Edema.   Risk Factors: None identified. Limitations: Poor ultrasound/tissue interface and body habitus. Comparison Study: No prior studies.  Performing Technologist: Oliver Hum RVT    Examination Guidelines: A complete evaluation includes B-mode imaging, spectral Doppler, color Doppler, and power Doppler as needed of all accessible portions of each vessel. Bilateral testing is considered an integral part of a complete examination. Limited examinations for reoccurring indications may be performed as noted. The reflux portion of the exam is performed with the patient in reverse Trendelenburg.    +---------+---------------+---------+-----------+----------+--------------+  RIGHT     Compressibility Phasicity Spontaneity Properties Thrombus Aging  +---------+---------------+---------+-----------+----------+--------------+  CFV       Full            Yes       Yes                                    +---------+---------------+---------+-----------+----------+--------------+  SFJ       Full                                                             +---------+---------------+---------+-----------+----------+--------------+  FV Prox   Full                                                             +---------+---------------+---------+-----------+----------+--------------+  FV Mid    Full                                                             +---------+---------------+---------+-----------+----------+--------------+  FV Distal                 Yes       Yes                                    +---------+---------------+---------+-----------+----------+--------------+  PFV       Full                                                              +---------+---------------+---------+-----------+----------+--------------+  POP       Full  Yes       Yes                                    +---------+---------------+---------+-----------+----------+--------------+  PTV       Full                                                             +---------+---------------+---------+-----------+----------+--------------+  PERO      Full                                                             +---------+---------------+---------+-----------+----------+--------------+        +---------+---------------+---------+-----------+----------+--------------+  LEFT      Compressibility Phasicity Spontaneity Properties Thrombus Aging  +---------+---------------+---------+-----------+----------+--------------+  CFV       Full            Yes       Yes                                    +---------+---------------+---------+-----------+----------+--------------+  SFJ       Full                                                             +---------+---------------+---------+-----------+----------+--------------+  FV Prox   Full                                                             +---------+---------------+---------+-----------+----------+--------------+  FV Mid    Full                                                             +---------+---------------+---------+-----------+----------+--------------+  FV Distal                 Yes       Yes                                    +---------+---------------+---------+-----------+----------+--------------+  PFV       Full                                                             +---------+---------------+---------+-----------+----------+--------------+  POP       Full            Yes       Yes                                    +---------+---------------+---------+-----------+----------+--------------+  PTV       Full                                                              +---------+---------------+---------+-----------+----------+--------------+  PERO      Full                                                             +---------+---------------+---------+-----------+----------+--------------+             Summary: RIGHT: - There is no evidence of deep vein thrombosis in the lower extremity. However, portions of this examination were limited- see technologist comments above.   - No cystic structure found in the popliteal fossa.   LEFT: - There is no evidence of deep vein thrombosis in the lower extremity. However, portions of this examination were limited- see technologist comments above.   - No cystic structure found in the popliteal fossa.   *See table(s) above for measurements and observations.  Electronically signed by Monica Martinez MD on 08/12/2019 at 11:38:46 AM.      Final     Assessment & Plan:    See Problem List for Assessment and Plan of chronic medical problems.    This visit occurred during the SARS-CoV-2 public health emergency.  Safety protocols were in place, including screening questions prior to the visit, additional usage of staff PPE, and extensive cleaning of exam room while observing appropriate contact time as indicated for disinfecting solutions.

## 2019-08-21 NOTE — Patient Instructions (Addendum)
  Blood work was ordered.     Medications reviewed and updated.  Changes include :   Start lasix 20 mg daily.  We will discontinue gabapentin for now.    Your prescription(s) have been submitted to your pharmacy. Please take as directed and contact our office if you believe you are having problem(s) with the medication(s).

## 2019-08-22 ENCOUNTER — Encounter: Payer: Self-pay | Admitting: Internal Medicine

## 2019-08-22 ENCOUNTER — Other Ambulatory Visit: Payer: Self-pay

## 2019-08-22 ENCOUNTER — Ambulatory Visit (INDEPENDENT_AMBULATORY_CARE_PROVIDER_SITE_OTHER): Payer: Medicare Other | Admitting: Internal Medicine

## 2019-08-22 VITALS — BP 128/84 | HR 104 | Temp 98.2°F | Ht 63.0 in | Wt 303.0 lb

## 2019-08-22 DIAGNOSIS — R197 Diarrhea, unspecified: Secondary | ICD-10-CM

## 2019-08-22 DIAGNOSIS — I10 Essential (primary) hypertension: Secondary | ICD-10-CM

## 2019-08-22 DIAGNOSIS — R600 Localized edema: Secondary | ICD-10-CM | POA: Diagnosis not present

## 2019-08-22 DIAGNOSIS — G629 Polyneuropathy, unspecified: Secondary | ICD-10-CM | POA: Diagnosis not present

## 2019-08-22 DIAGNOSIS — R2689 Other abnormalities of gait and mobility: Secondary | ICD-10-CM

## 2019-08-22 MED ORDER — FUROSEMIDE 20 MG PO TABS: 20.0000 mg | ORAL_TABLET | Freq: Every day | ORAL | 1 refills | Status: DC

## 2019-08-22 NOTE — Assessment & Plan Note (Signed)
Chronic Currently has no edema on exam, which may be related to discontinuing the gabapentin or laying down more in the hospital and since she has been home.  She is also been compliant with a low-sodium diet We will continue to hold gabapentin We will start Lasix 20 mg daily for chronic leg edema

## 2019-08-22 NOTE — Assessment & Plan Note (Signed)
Acute Resolved, but stools have not returned completely to normal, which is somewhat expected Still having loose stools, but slowly improving and returning to normal Still has some decreased appetite Eating small amounts of food, bland food and drinking plenty of fluids Continue above and slowly advance Discussed that her stools, energy level and appetite should slowly improve and if it does not continue to improve or worsen she should let me know

## 2019-08-22 NOTE — Assessment & Plan Note (Signed)
Acute on chronic poor balance-worse after her recent hospitalization Overall physical deconditioning Doing PT at home-continue Uses walker

## 2019-08-22 NOTE — Assessment & Plan Note (Signed)
Chronic Was on gabapentin 600 mg at night ? How much it helped and may have contributed to leg edema - held upon d/c from hospital July 2021 Will reconsider trying it again in future

## 2019-08-22 NOTE — Assessment & Plan Note (Signed)
Chronic Blood pressure well controlled Continue current medications 

## 2019-08-23 LAB — CBC WITH DIFFERENTIAL/PLATELET
Absolute Monocytes: 506 cells/uL (ref 200–950)
Basophils Absolute: 49 cells/uL (ref 0–200)
Basophils Relative: 0.8 %
Eosinophils Absolute: 220 cells/uL (ref 15–500)
Eosinophils Relative: 3.6 %
HCT: 37.2 % (ref 35.0–45.0)
Hemoglobin: 11.9 g/dL (ref 11.7–15.5)
Lymphs Abs: 2568 cells/uL (ref 850–3900)
MCH: 26.8 pg — ABNORMAL LOW (ref 27.0–33.0)
MCHC: 32 g/dL (ref 32.0–36.0)
MCV: 83.8 fL (ref 80.0–100.0)
MPV: 10.2 fL (ref 7.5–12.5)
Monocytes Relative: 8.3 %
Neutro Abs: 2757 cells/uL (ref 1500–7800)
Neutrophils Relative %: 45.2 %
Platelets: 242 10*3/uL (ref 140–400)
RBC: 4.44 10*6/uL (ref 3.80–5.10)
RDW: 16 % — ABNORMAL HIGH (ref 11.0–15.0)
Total Lymphocyte: 42.1 %
WBC: 6.1 10*3/uL (ref 3.8–10.8)

## 2019-08-23 LAB — BASIC METABOLIC PANEL WITH GFR
BUN/Creatinine Ratio: 9 (calc) (ref 6–22)
BUN: 5 mg/dL — ABNORMAL LOW (ref 7–25)
CO2: 26 mmol/L (ref 20–32)
Calcium: 8.8 mg/dL (ref 8.6–10.4)
Chloride: 105 mmol/L (ref 98–110)
Creat: 0.56 mg/dL (ref 0.50–0.99)
GFR, Est African American: 111 mL/min/{1.73_m2} (ref >=60)
GFR, Est Non African American: 96 mL/min/{1.73_m2} (ref >=60)
Glucose, Bld: 109 mg/dL — ABNORMAL HIGH (ref 65–99)
Potassium: 3.6 mmol/L (ref 3.5–5.3)
Sodium: 139 mmol/L (ref 135–146)

## 2019-08-27 ENCOUNTER — Telehealth: Payer: Self-pay

## 2019-08-27 DIAGNOSIS — K589 Irritable bowel syndrome without diarrhea: Secondary | ICD-10-CM | POA: Diagnosis not present

## 2019-08-27 DIAGNOSIS — I10 Essential (primary) hypertension: Secondary | ICD-10-CM | POA: Diagnosis not present

## 2019-08-27 DIAGNOSIS — R5383 Other fatigue: Secondary | ICD-10-CM | POA: Diagnosis not present

## 2019-08-27 DIAGNOSIS — R600 Localized edema: Secondary | ICD-10-CM | POA: Diagnosis not present

## 2019-08-27 DIAGNOSIS — E1142 Type 2 diabetes mellitus with diabetic polyneuropathy: Secondary | ICD-10-CM | POA: Diagnosis not present

## 2019-08-27 DIAGNOSIS — G40009 Localization-related (focal) (partial) idiopathic epilepsy and epileptic syndromes with seizures of localized onset, not intractable, without status epilepticus: Secondary | ICD-10-CM | POA: Diagnosis not present

## 2019-08-27 NOTE — Telephone Encounter (Signed)
Okay to order as requested. 

## 2019-08-27 NOTE — Telephone Encounter (Signed)
New message    [facility] calling for verbal order   Two week x one   One week x one   Two week x two  One week x one  Effective 8.2.21

## 2019-08-27 NOTE — Telephone Encounter (Signed)
Verbals given  

## 2019-08-31 ENCOUNTER — Telehealth: Payer: Self-pay | Admitting: Internal Medicine

## 2019-08-31 NOTE — Telephone Encounter (Signed)
New message:   Pt is calling and would like to know why her medication acetaminophen (TYLENOL) 500 MG tablet was denied for a refill. She states that the pharmacy stated cause she hasn't been here but the pt has an appt on 09/07/19 with Dr. Quay Burow. Please advise.

## 2019-09-01 MED ORDER — ACETAMINOPHEN 500 MG PO TABS: 500.0000 mg | ORAL_TABLET | Freq: Four times a day (QID) | ORAL | 3 refills | Status: AC | PRN

## 2019-09-01 NOTE — Telephone Encounter (Signed)
I was not the last prescriber so the pharmacy likely sent it to whoever prescribed it last.    I did send a refill in but she can not exceed a total of 3000 mg of tylenol in a 24 hr period ---- that include the tylenol from her tylenol with codeine and this 500 mg tylenol.

## 2019-09-03 NOTE — Telephone Encounter (Signed)
Acetaminophen-Codeine 300-30 MG tablet  Patient states this medication was filled by Dr. Quay Burow and wants a refill  Patient also wants to know if she should keep her 8.20.21 appointment since she just saw Dr. Quay Burow on 8.4.21   Last appt: 8.4.21 Next appt: 8.20.21 (*if patient still needs it)

## 2019-09-04 NOTE — Telephone Encounter (Signed)
   Scheduler left vcml to reschedule appointment

## 2019-09-04 NOTE — Telephone Encounter (Signed)
Ok to reschedule appt to October.  Med refill sent to pharmacy earlier today

## 2019-09-05 DIAGNOSIS — I10 Essential (primary) hypertension: Secondary | ICD-10-CM | POA: Diagnosis not present

## 2019-09-05 DIAGNOSIS — E1142 Type 2 diabetes mellitus with diabetic polyneuropathy: Secondary | ICD-10-CM | POA: Diagnosis not present

## 2019-09-05 DIAGNOSIS — G40009 Localization-related (focal) (partial) idiopathic epilepsy and epileptic syndromes with seizures of localized onset, not intractable, without status epilepticus: Secondary | ICD-10-CM | POA: Diagnosis not present

## 2019-09-05 DIAGNOSIS — R5383 Other fatigue: Secondary | ICD-10-CM | POA: Diagnosis not present

## 2019-09-05 DIAGNOSIS — R600 Localized edema: Secondary | ICD-10-CM | POA: Diagnosis not present

## 2019-09-05 DIAGNOSIS — K589 Irritable bowel syndrome without diarrhea: Secondary | ICD-10-CM | POA: Diagnosis not present

## 2019-09-07 ENCOUNTER — Ambulatory Visit: Payer: Medicare Other | Admitting: Internal Medicine

## 2019-09-10 DIAGNOSIS — M8580 Other specified disorders of bone density and structure, unspecified site: Secondary | ICD-10-CM

## 2019-09-10 DIAGNOSIS — D649 Anemia, unspecified: Secondary | ICD-10-CM

## 2019-09-10 DIAGNOSIS — K589 Irritable bowel syndrome without diarrhea: Secondary | ICD-10-CM | POA: Diagnosis not present

## 2019-09-10 DIAGNOSIS — E875 Hyperkalemia: Secondary | ICD-10-CM

## 2019-09-10 DIAGNOSIS — Z8701 Personal history of pneumonia (recurrent): Secondary | ICD-10-CM

## 2019-09-10 DIAGNOSIS — E669 Obesity, unspecified: Secondary | ICD-10-CM

## 2019-09-10 DIAGNOSIS — K760 Fatty (change of) liver, not elsewhere classified: Secondary | ICD-10-CM

## 2019-09-10 DIAGNOSIS — R5383 Other fatigue: Secondary | ICD-10-CM | POA: Diagnosis not present

## 2019-09-10 DIAGNOSIS — E1151 Type 2 diabetes mellitus with diabetic peripheral angiopathy without gangrene: Secondary | ICD-10-CM | POA: Diagnosis not present

## 2019-09-10 DIAGNOSIS — I051 Rheumatic mitral insufficiency: Secondary | ICD-10-CM | POA: Diagnosis not present

## 2019-09-10 DIAGNOSIS — F329 Major depressive disorder, single episode, unspecified: Secondary | ICD-10-CM

## 2019-09-10 DIAGNOSIS — G43109 Migraine with aura, not intractable, without status migrainosus: Secondary | ICD-10-CM | POA: Diagnosis not present

## 2019-09-10 DIAGNOSIS — E039 Hypothyroidism, unspecified: Secondary | ICD-10-CM | POA: Diagnosis not present

## 2019-09-10 DIAGNOSIS — E1142 Type 2 diabetes mellitus with diabetic polyneuropathy: Secondary | ICD-10-CM | POA: Diagnosis not present

## 2019-09-10 DIAGNOSIS — I252 Old myocardial infarction: Secondary | ICD-10-CM

## 2019-09-10 DIAGNOSIS — G40009 Localization-related (focal) (partial) idiopathic epilepsy and epileptic syndromes with seizures of localized onset, not intractable, without status epilepticus: Secondary | ICD-10-CM | POA: Diagnosis not present

## 2019-09-10 DIAGNOSIS — Z96653 Presence of artificial knee joint, bilateral: Secondary | ICD-10-CM

## 2019-09-10 DIAGNOSIS — K219 Gastro-esophageal reflux disease without esophagitis: Secondary | ICD-10-CM

## 2019-09-10 DIAGNOSIS — Z602 Problems related to living alone: Secondary | ICD-10-CM

## 2019-09-10 DIAGNOSIS — M160 Bilateral primary osteoarthritis of hip: Secondary | ICD-10-CM

## 2019-09-10 DIAGNOSIS — Z9181 History of falling: Secondary | ICD-10-CM

## 2019-09-10 DIAGNOSIS — M797 Fibromyalgia: Secondary | ICD-10-CM

## 2019-09-10 DIAGNOSIS — R251 Tremor, unspecified: Secondary | ICD-10-CM

## 2019-09-10 DIAGNOSIS — Z7984 Long term (current) use of oral hypoglycemic drugs: Secondary | ICD-10-CM

## 2019-09-10 DIAGNOSIS — I700 Atherosclerosis of aorta: Secondary | ICD-10-CM | POA: Diagnosis not present

## 2019-09-10 DIAGNOSIS — F419 Anxiety disorder, unspecified: Secondary | ICD-10-CM

## 2019-09-10 DIAGNOSIS — K449 Diaphragmatic hernia without obstruction or gangrene: Secondary | ICD-10-CM

## 2019-09-10 DIAGNOSIS — N3281 Overactive bladder: Secondary | ICD-10-CM

## 2019-09-10 DIAGNOSIS — I10 Essential (primary) hypertension: Secondary | ICD-10-CM | POA: Diagnosis not present

## 2019-09-10 DIAGNOSIS — Z87440 Personal history of urinary (tract) infections: Secondary | ICD-10-CM

## 2019-09-10 DIAGNOSIS — G4733 Obstructive sleep apnea (adult) (pediatric): Secondary | ICD-10-CM | POA: Diagnosis not present

## 2019-09-10 DIAGNOSIS — R600 Localized edema: Secondary | ICD-10-CM | POA: Diagnosis not present

## 2019-09-10 DIAGNOSIS — Z6841 Body Mass Index (BMI) 40.0 and over, adult: Secondary | ICD-10-CM

## 2019-09-10 DIAGNOSIS — E559 Vitamin D deficiency, unspecified: Secondary | ICD-10-CM

## 2019-09-11 ENCOUNTER — Other Ambulatory Visit: Payer: Self-pay

## 2019-09-11 ENCOUNTER — Ambulatory Visit: Payer: Medicare Other | Admitting: Pharmacist

## 2019-09-11 NOTE — Addendum Note (Signed)
Addended by: Karle Barr on: 09/11/2019 04:45 PM   Modules accepted: Orders

## 2019-09-11 NOTE — Chronic Care Management (AMB) (Signed)
Chronic Care Management Pharmacy  Name: Alexis Grant  MRN: [id number] DOB: 10/17/1950   Chief Complaint/ HPI  Alexis Grant,  69 y.o. , female presents for their Initial CCM visit with the clinical pharmacist via telephone due to COVID-19 Pandemic.  PCP : Binnie Rail, MD Patient Care Team: Binnie Rail, MD as PCP - General (Internal Medicine) Cameron Sprang, MD as Consulting Physician (Neurology) Garald Balding, MD as Consulting Physician (Orthopedic Surgery) Chesley Mires, MD as Consulting Physician (Pulmonary Disease) Monna Fam, MD as Consulting Physician (Ophthalmology) Cameron Sprang, MD as Consulting Physician (Neurology) Charlton Haws, [facility] as Pharmacist (Pharmacist)  Their chronic conditions include: Hypertension, Hyperlipidemia, Diabetes, Coronary Artery Disease, GERD, Hypothyroidism, Depression, Osteopenia, Osteoarthritis, Overactive Bladder and Allergic Rhinitis, Epilepsy, Fatty liver disease, Migraine, Fibromyalgia  Pt has lived in [place] last 20 years. Born in [place], lived in [place] as well. Husband was in shopping center business ([employer]). Kids were raised in [place]. Husband passed away this year. 3 children - 2 sons, 1 daughter in [place]. Last job in [employer], retired from there.   Pt reports GI issues/diarrhea is not improving since hospitalization in July. She spends an hour in the bathroom every morning, takes loperamide daily, and cannot keep any food down except for Jello and water. She feels weak and shaky much of the time.  Office Visits: 08/22/19 Dr Quay Burow OV: hospital f/u. Swelling improved, continue to hold gabapentin, start lasix 20 mg daily.  06/01/19 Dr Quay Burow OV: increased sertraline to 150 mg  Consult Visit: 08/11/19 - 08/15/19 Hospital admission: N/V, diarrhea unclear cause, improved with IVF. B/l leg edema unclear cause, ECHO normal EF, oral lasix did not improve. Rec'd home PT. Hold gabapentin d/t leg  swelling.  04/06/19 NP Tammy Parrett (pulmonary): OSA f/u, no changes.  03/23/19 Dr [name] (neurology): f/u for neuropathy, migraine, epilepsy. Increased gabapentin to 600 mg HS. C/O tremors, depakote may be contributing, consider decreasing dose in future.   Allergies  Allergen Reactions  . Hydrocodone-Acetaminophen Itching    Itches all over; without rash per pt    Medications: Outpatient Encounter Medications as of 09/11/2019  Medication Sig  . acetaminophen (TYLENOL) 500 MG tablet Take 1 tablet (500 mg total) by mouth every 6 (six) hours as needed.  . blood glucose meter kit and supplies KIT Dispense based on patient and insurance preference. Use up to four times daily as directed. (FOR E11.9).  [name] divalproex (DEPAKOTE) 500 MG DR tablet Take 1 tab twice a day  . furosemide (LASIX) 20 MG tablet Take 1 tablet (20 mg total) by mouth daily.  [name] glipiZIDE (GLUCOTROL) 10 MG tablet TAKE 1 TABLET (10 MG TOTAL) BY MOUTH 2 (TWO) TIMES DAILY BEFORE A MEAL.  [name] levothyroxine (SYNTHROID) 75 MCG tablet TAKE 1 TABLET BY MOUTH ONCE DAILY BEFORE BREAKFAST  . loperamide (IMODIUM) 2 MG capsule Take 1 capsule (2 mg total) by mouth 4 (four) times daily as needed for diarrhea or loose stools.  [name] losartan (COZAAR) 50 MG tablet TAKE 1 TABLET BY MOUTH EVERY DAY  . metFORMIN (GLUCOPHAGE) 1000 MG tablet Take 1 tablet (1,000 mg total) by mouth 2 (two) times daily with a meal.  . omeprazole (PRILOSEC) 40 MG capsule TAKE 1 CAPSULE BY MOUTH EVERY DAY  . ondansetron (ZOFRAN-ODT) 4 MG disintegrating tablet Take 1 tablet (4 mg total) by mouth every 8 (eight) hours as needed for nausea or vomiting.  [name] oxybutynin (DITROPAN XL) 15 MG 24  hr tablet Take 1 tablet (15 mg total) by mouth at bedtime.  . sertraline (ZOLOFT) 100 MG tablet Take 1.5 tablets (150 mg total) by mouth daily.  . simvastatin (ZOCOR) 20 MG tablet TAKE 1 TABLET BY MOUTH EVERYDAY AT BEDTIME  . topiramate (TOPAMAX) 100 MG tablet Take 1 tablet (100 mg total) by  mouth 2 (two) times daily.  . traZODone (DESYREL) 50 MG tablet TAKE 2 TABLETS BY MOUTH AT BEDTIME FOR SLEEP   No facility-administered encounter medications on file as of 09/11/2019.     Current Diagnosis/Assessment:  SDOH Interventions     Most Recent Value  SDOH Interventions  Financial Strain Interventions Intervention Not Indicated      Goals Addressed            This Visit's Progress   . Pharmacy Care Plan       CARE PLAN ENTRY (see longitudinal plan of care for additional care plan information)  Current Barriers:  . Chronic Disease Management support, education, and care coordination needs related to Hypertension, Hyperlipidemia, Diabetes, and GERD   Hypertension BP Readings from Last 3 Encounters:  08/22/19 128/84  08/15/19 (!) 157/69  06/01/19 128/74 .  Pharmacist Clinical Goal(s): o Over the next 30 days, patient will work with PharmD and providers to maintain BP goal <130/80 . Current regimen:  . Losartan 50 mg daily . Interventions: o Discussed BP goals and benefits of medications for prevention of heart attack / stroke o Discussed stress, pain can increase blood pressure temporarily . Patient self care activities - Over the next 30 days, patient will: o Check BP daily, document, and provide at future appointments o Ensure daily salt intake < 2300 mg/day  Hyperlipidemia / CAD Lab Results  Component Value Date/Time   LDLCALC 81 06/01/2019 11:52 AM .  Pharmacist Clinical Goal(s): o Over the next 90 days, patient will work with PharmD and providers to achieve LDL goal < 70 . Current regimen:  o Simvastatin 20 mg daily . Interventions: o Discussed cholesterol goals and benefits of medications for prevention of heart attack / stroke . Patient self care activities - Over the next 90 days, patient will: o Continue medication as prescribed  Diabetes Lab Results  Component Value Date/Time   HGBA1C 5.8 (H) 08/12/2019 12:12 AM   HGBA1C 5.9 06/01/2019 11:52  AM .  Pharmacist Clinical Goal(s): o Over the next 30 days, patient will work with PharmD and providers to maintain A1c goal <7% . Current regimen:  o Metformin 1000 mg twice a day o Glipizide 10 mg twice a day . Interventions: o Discussed A1c goals and benefits of medications for diabetic complications o Recommended to hold metformin due to diarrhea and inability to keep food down . Patient self care activities - Over the next 30 days, patient will: o Check blood sugar once daily and in the morning before eating or drinking, document, and provide at future appointments o Contact provider with any episodes of hypoglycemia  GERD . Pharmacist Clinical Goal(s) o Over the next 30 days, patient will work with PharmD and providers to optimize therapy . Current regimen:  o Omeprazole 40 mg daily . Interventions: o Recommend trial off PPI to determine necessity . Patient self care activities - Over the next 30 days, patient will: o Take omeprazole every other day x 1 week, then stop and monitor for heartburn / reflux symptoms  Medication management . Pharmacist Clinical Goal(s): o Over the next 30 days, patient will work with PharmD and  providers to maintain optimal medication adherence . [facility] . Interventions o Comprehensive medication review performed. o Continue current medication management strategy . Patient self care activities - Over the next 30 days, patient will: o Focus on medication adherence by fill date o Take medications as prescribed o Report any questions or concerns to PharmD and/or provider(s)  Initial goal documentation       Hypertension   BP goal is:  <130/80  Office blood pressures are  BP Readings from Last 3 Encounters:  08/22/19 128/84  08/15/19 (!) 157/69  06/01/19 128/74   Patient checks BP at home infrequently - PT checks it when they come Patient home BP readings are ranging: 160/100 on 8/20  Patient has failed these meds in  the past: diltiazem Patient is currently controlled on the following medications:  . Losartan 50 mg daily . Furosemide 20 mg daily  We discussed: BP goals; benefits of medication; stress/pain can increase BP temporarily; pt recently had some high readings when PT has checked, but she has been in more pain than usual and still suffering from daily loose stools. She does report swelling is completely gone since taking furosemide daily.   Plan  Continue current medications  Recommend to monitor BMP shortly given loose stools and new furosemide.    Hyperlipidemia   LDL goal < 70 Hx MI 2007, tx medically  Lipid Panel     Component Value Date/Time   CHOL 158 06/01/2019 1152   TRIG 130.0 06/01/2019 1152   HDL 50.80 06/01/2019 1152   LDLCALC 81 06/01/2019 1152    Hepatic Function Latest Ref Rng & Units 08/12/2019 08/11/2019 06/01/2019  Total Protein 6.5 - 8.1 g/dL 5.7(L) 6.9 6.5  Albumin 3.5 - 5.0 g/dL 3.0(L) 3.6 3.7  AST 15 - 41 U/L 9(L) 11(L) 6  ALT 0 - 44 U/L '8 9 4  ' Alk Phosphatase 38 - 126 U/L 37(L) 49 56  Total Bilirubin 0.3 - 1.2 mg/dL 0.3 0.4 0.2  Bilirubin, Direct 0.0 - 0.3 mg/dL - - -    The ASCVD Risk score (McIntosh., et al., 2013) failed to calculate for the following reasons:   The patient has a prior MI or stroke diagnosis   Patient has failed these meds in past: n/a Patient is currently uncontrolled on the following medications:  . Simvastatin 20 mg HS  We discussed:  Cholesterol goals; benefit of statin for ASCVD risk reduction; with history of MI a high intensity statin is recommended for secondary prevention, pt is dealing with acute issues today so will address at follow up.   Plan  Continue current medications  Consider switch to high intensity statin when acute issues stabilize  Diabetes   A1c goal <6.5%  Recent Relevant Labs: Lab Results  Component Value Date/Time   HGBA1C 5.8 (H) 08/12/2019 12:12 AM   HGBA1C 5.9 06/01/2019 11:52 AM   GFR 112.11  06/01/2019 11:52 AM   GFR 97.52 12/22/2018 01:15 PM   MICROALBUR 4.2 (H) 11/25/2015 09:39 AM   MICROALBUR 2.0 (H) 10/01/2014 12:11 PM    Last diabetic Eye exam:  Lab Results  Component Value Date/Time   HMDIABEYEEXA No Retinopathy 03/28/2017 12:00 AM   HMDIABEYEEXA No Retinopathy 03/28/2017 12:00 AM    Last diabetic Foot exam: No results found for: HMDIABFOOTEX   Checking BG: Daily  Recent FBG Readings: 120 after eating  Patient has failed these meds in past: n/a Patient is currently controlled on the following medications: [name] Metformin 1000  mg BID . Glipizide 10 mg BID  We discussed: A1c goal; concern for over-controlling DM; pt has had low blood sugars before and typically feels shaky, sweaty, and dizzy. She has been feeling like this lately but more likely related to loose stools and decreased PO intake. Discussed metformin-related diarrhea and importance of taking with food. Pt can hardly keep any food down, only tolerating jello and water per patient. It is reasonable to hold metformin until GI issues resolve.  Plan  Continue glipizide 10 mg BID Recommend to hold metformin until diarrhea resolves Recommend to check fasting BG and monitor for low BG  Depression   Depression screen [facility] 2/9 11/22/2018 11/16/2017 11/15/2016  Decreased Interest 0 1 0  Down, Depressed, Hopeless 1 2 0  PHQ - 2 Score 1 3 0  Altered sleeping 3 3 -  Tired, decreased energy 2 2 -  Change in appetite 1 0 -  Feeling bad or failure about yourself  0 1 -  Trouble concentrating 0 0 -  Moving slowly or fidgety/restless 0 0 -  Suicidal thoughts 0 0 -  PHQ-9 Score 7 9 -  Difficult doing work/chores Not difficult at all Not difficult at all -  Some recent data might be hidden   Patient has failed these meds in past: n/a Patient is currently controlled on the following medications:  . Sertraline 100 mg - 1.5 tablets daily . Trazodone 50 mg - 2 tab HS  We discussed:  Pt reports doing well with  sertraline. She is not sleeping well despite trazodone nightly; she does sleep in a recliner due to arthritic hip pain; pt reports pain is the main issue keeping her awake.  Plan  Continue current medications  Hypothyroidism   Lab Results  Component Value Date/Time   TSH 1.841 08/12/2019 03:55 AM   TSH 4.17 06/01/2019 11:52 AM   TSH 2.83 12/22/2018 01:15 PM   FREET4 0.5 (L) 01/28/2006 01:34 PM   Patient has failed these meds in past: n/a Patient is currently controlled on the following medications:  . Levothyroxine 75 mcg daily  We discussed:  TSH is at goal; pt denies issues  Plan  Continue current medications  GERD   Patient has failed these meds in past: n/a Patient is currently controlled on the following medications:  . Omeprazole 40 mg daily  We discussed:  Pt denies significant issues with heartburn / reflux; she reports she has been on omeprazole "forever". Discussed long term complications associated with PPI use.  Plan  Recommend trial off PPI to determine necessity  Epilepsy   Patient has failed these meds in past: n/a Patient is currently controlled on the following medications:  . Divalproex 500 mg BID . Topiramate 100 mg BID  We discussed:  Pt reports last seizure around 2002, neurologist may reduce divalproex dose in the near future since she recently increased topiramate  Plan  Continue current medications  Migraine   Patient has failed these meds in past: n/a Patient is currently controlled on the following medications:  . Topiramate 100 mg BID  We discussed:  Pt reports topiramate is helping with migraines, denies issues  Plan  Continue current medications  IBS   Patient has failed these meds in past: n/a Patient is currently uncontrolled on the following medications:  . Loperamide 2 mg PRN  We discussed:  Pt takes 2 mg of loperamide daily, afraid to take more. Discussed maximum daily dose of loperamide is 16 mg. Discussed side  effects  of loperamide including constipation.  Plan  Recommend 4 mg of loperamide with first loose stool, may repeat 2 mg for each subsequent loose still (max 16 mg/24 hrs)  Overactive Bladder   Patient has failed these meds in past: n/a Patient is currently controlled on the following medications:  . Oxybutynin XL 15 mg HS  We discussed: Patient is satisfied with current regimen and denies issues  Plan  Continue current medications  Osteopenia    Last DEXA Scan: 11/17/2016  T-Score femoral neck: -1.2  T-Score total hip: n/a  T-Score lumbar spine: +0.5  T-Score forearm radius: n/a  10-year probability of major osteoporotic fracture: 6.7%  10-year probability of hip fracture: 0.5%  Vit D, 25-Hydroxy  Date Value Ref Range Status  06/09/2009 45 30 - 89 ng/mL Final    Comment:    See lab report for associated comment(s)    Patient is not a candidate for pharmacologic treatment  Patient has failed these meds in past: n/a Patient is currently controlled on the following medications:  . No medications  We discussed:  Recommend 1200 mg of calcium daily from dietary and supplemental sources.  Plan  Continue control with diet and exercise  Pain   Osteoarthritis Fibromyalgia Neuropathy  Patient has failed these meds in past: gabapentin (swelling) Patient is currently uncontrolled on the following medications:  . Tylenol w/codeine 300-63m q6h PRN --out of med . Tylenol 500 mg PRN  We discussed:  Pt reports significant arthritic hip pain that is interfering with sleep and daily activities. It has been better controlled with Tylenol #3 in the past, but this med was dc'd at discharge last month and has not yet been renewed. Pt requests refill from PCP.  Checked PMP - Tylenol #3 last filled 07/24/19 for #120 tablets.  Plan  Request refill for Tylenol w/ codeine  #120 per month  Medication Management   [facility] Uses pill box? No -  prefers bottles Pt endorses 100% compliance  We discussed: Pt reports all medications are covered. [facility] with [facility]. Pt is satisfied with pharmacy services.  Plan  Continue current medication management strategy    Follow up: 1 month phone visit  LCharlene Brooke PharmD, BClearwater Ambulatory Surgical Centers IncClinical Pharmacist LFalconerPrimary Care at GLifeways [phone]

## 2019-09-11 NOTE — Patient Instructions (Addendum)
Visit Information  Phone number for Pharmacist: [phone]  Thank you for meeting with me to discuss your medications! I look forward to working with you to achieve your health care goals. Below is a summary of what we talked about during the visit:  Goals Addressed            This [place] (see longitudinal plan of care for additional care plan information)  Current Barriers:   Chronic Disease Management support, education, and care coordination needs related to Hypertension, Hyperlipidemia, Diabetes, and GERD   Hypertension BP Readings from Last 3 Encounters:  08/22/19 128/84  08/15/19 (!) 157/69  06/01/19 128/74   Pharmacist Clinical Goal(s): o Over the next 30 days, patient will work with PharmD and providers to maintain BP goal <130/80  Current regimen:   Losartan 50 mg daily  Interventions: o Discussed BP goals and benefits of medications for prevention of heart attack / stroke o Discussed stress, pain can increase blood pressure temporarily  Patient self care activities - Over the next 30 days, patient will: o Check BP daily, document, and provide at future appointments o Ensure daily salt intake < 2300 mg/day  Hyperlipidemia / CAD Lab Results  Component Value Date/Time   LDLCALC 81 06/01/2019 11:52 AM   Pharmacist Clinical Goal(s): o Over the next 90 days, patient will work with PharmD and providers to achieve LDL goal < 70  Current regimen:  o Simvastatin 20 mg daily  Interventions: o Discussed cholesterol goals and benefits of medications for prevention of heart attack / stroke  Patient self care activities - Over the next 90 days, patient will: o Continue medication as prescribed  Diabetes Lab Results  Component Value Date/Time   HGBA1C 5.8 (H) 08/12/2019 12:12 AM   HGBA1C 5.9 06/01/2019 11:52 AM   Pharmacist Clinical Goal(s): o Over the next 30 days, patient will work with PharmD and  providers to maintain A1c goal <7%  Current regimen:  o Metformin 1000 mg twice a day o Glipizide 10 mg twice a day  Interventions: o Discussed A1c goals and benefits of medications for diabetic complications o Recommended to hold metformin due to diarrhea and inability to keep food down  Patient self care activities - Over the next 30 days, patient will: o Check blood sugar once daily and in the morning before eating or drinking, document, and provide at future appointments o Contact provider with any episodes of hypoglycemia  GERD  Pharmacist Clinical Goal(s) o Over the next 30 days, patient will work with PharmD and providers to optimize therapy  Current regimen:  o Omeprazole 40 mg daily  Interventions: o Recommend trial off PPI to determine necessity  Patient self care activities - Over the next 30 days, patient will: o Take omeprazole every other day x 1 week, then stop and monitor for heartburn / reflux symptoms  Medication management  Pharmacist Clinical Goal(s): o Over the next 30 days, patient will work with PharmD and providers to maintain optimal medication adherence  [facility]  Interventions o Comprehensive medication review performed. o Continue current medication management strategy  Patient self care activities - Over the next 30 days, patient will: o Focus on medication adherence by fill date o Take medications as prescribed o Report any questions or concerns to PharmD and/or provider(s)  Initial goal documentation      Ms. Helzer was given information about Chronic Care  Management services today including:  1. CCM service includes personalized support from designated clinical staff supervised by her physician, including individualized plan of care and coordination with other care providers 2. 24/7 contact phone numbers for assistance for urgent and routine care needs. 3. Standard insurance, coinsurance, copays and deductibles apply for  chronic care management only during months in which we provide at least 20 minutes of these services. Most insurances cover these services at 100%, however patients may be responsible for any copay, coinsurance and/or deductible if applicable. This service may help you avoid the need for more expensive face-to-face services. 4. Only one practitioner may furnish and bill the service in a calendar month. 5. The patient may stop CCM services at any time (effective at the end of the month) by phone call to the office staff.  Patient agreed to services and verbal consent obtained.   Patient verbalizes understanding of instructions provided today.  The pharmacy team will reach out to the patient again over the next 30 days.   Charlene Brooke, PharmD, BCACP Clinical Pharmacist [facility] [phone]  Blood Glucose Monitoring, Adult Monitoring your blood sugar (glucose) is an important part of managing your diabetes (diabetes mellitus). Blood glucose monitoring involves checking your blood glucose as often as directed and keeping a record (log) of your results over time. Checking your blood glucose regularly and keeping a blood glucose log can:  Help you and your health care provider adjust your diabetes management plan as needed, including your medicines or insulin.  Help you understand how food, exercise, illnesses, and medicines affect your blood glucose.  Let you know what your blood glucose is at any time. You can quickly find out if you have low blood glucose (hypoglycemia) or high blood glucose (hyperglycemia). Your health care provider will set individualized treatment goals for you. Your goals will be based on your age, other medical conditions you have, and how you respond to diabetes treatment. Generally, the goal of treatment is to maintain the following blood glucose levels:  Before meals (preprandial): 80-130 mg/dL (4.4-7.2 mmol/L).  After meals  (postprandial): below 180 mg/dL (10 mmol/L).  A1c level: less than 7%. Supplies needed:  Blood glucose meter.  Test strips for your meter. Each meter has its own strips. You must use the strips that came with your meter.  A needle to prick your finger (lancet). Do not use a lancet more than one time.  A device that holds the lancet (lancing device).  A journal or log book to write down your results. How to check your blood glucose  1. Wash your hands with soap and water. 2. Prick the side of your finger (not the tip) with the lancet. Use a different finger each time. 3. Gently rub the finger until a small drop of blood appears. 4. Follow instructions that come with your meter for inserting the test strip, applying blood to the strip, and using your blood glucose meter. 5. Write down your result and any notes. Some meters allow you to use areas of your body other than your finger (alternative sites) to test your blood. The most common alternative sites are:  Forearm.  Thigh.  Palm of the hand. If you think you may have hypoglycemia, or if you have a history of not knowing when your blood glucose is getting low (hypoglycemia unawareness), do not use alternative sites. Use your finger instead. Alternative sites may not be as accurate as the fingers, because blood flow  is slower in these areas. This means that the result you get may be delayed, and it may be different from the result that you would get from your finger. Follow these instructions at home: Blood glucose log   Every time you check your blood glucose, write down your result. Also write down any notes about things that may be affecting your blood glucose, such as your diet and exercise for the day. This information can help you and your health care provider: ? Look for patterns in your blood glucose over time. ? Adjust your diabetes management plan as needed.  Check if your meter allows you to download your records to a  computer. Most glucose meters store a record of glucose readings in the meter. If you have type 1 diabetes:  Check your blood glucose 2 or more times a day.  Also check your blood glucose: ? Before every insulin injection. ? Before and after exercise. ? Before meals. ? 2 hours after a meal. ? Occasionally between 2:00 a.m. and 3:00 a.m., as directed. ? Before potentially dangerous tasks, like driving or using heavy machinery. ? At bedtime.  You may need to check your blood glucose more often, up to 6-10 times a day, if you: ? Use an insulin pump. ? Need multiple daily injections (MDI). ? Have diabetes that is not well-controlled. ? Are ill. ? Have a history of severe hypoglycemia. ? Have hypoglycemia unawareness. If you have type 2 diabetes:  If you take insulin or other diabetes medicines, check your blood glucose 2 or more times a day.  If you are on intensive insulin therapy, check your blood glucose 4 or more times a day. Occasionally, you may also need to check between 2:00 a.m. and 3:00 a.m., as directed.  Also check your blood glucose: ? Before and after exercise. ? Before potentially dangerous tasks, like driving or using heavy machinery.  You may need to check your blood glucose more often if: ? Your medicine is being adjusted. ? Your diabetes is not well-controlled. ? You are ill. General tips  Always keep your supplies with you.  If you have questions or need help, all blood glucose meters have a 24-hour "hotline" phone number that you can call. You may also contact your health care provider.  After you use a few boxes of test strips, adjust (calibrate) your blood glucose meter by following instructions that came with your meter. Contact a health care provider if:  Your blood glucose is at or above 240 mg/dL (13.3 mmol/L) for 2 days in a row.  You have been sick or have had a fever for 2 days or longer, and you are not getting better.  You have any of the  following problems for more than 6 hours: ? You cannot eat or drink. ? You have nausea or vomiting. ? You have diarrhea. Get help right away if:  Your blood glucose is lower than 54 mg/dL (3 mmol/L).  You become confused or you have trouble thinking clearly.  You have difficulty breathing.  You have moderate or large ketone levels in your urine. Summary  Monitoring your blood sugar (glucose) is an important part of managing your diabetes (diabetes mellitus).  Blood glucose monitoring involves checking your blood glucose as often as directed and keeping a record (log) of your results over time.  Your health care provider will set individualized treatment goals for you. Your goals will be based on your age, other medical conditions you have, and  how you respond to diabetes treatment.  Every time you check your blood glucose, write down your result. Also write down any notes about things that may be affecting your blood glucose, such as your diet and exercise for the day. This information is not intended to replace advice given to you by your health care provider. Make sure you discuss any questions you have with your health care provider. Document Revised: 10/28/2017 Document Reviewed: 06/16/2015 Elsevier Patient Education  2020 [employer].

## 2019-09-14 ENCOUNTER — Telehealth: Payer: Self-pay | Admitting: Pharmacist

## 2019-09-14 ENCOUNTER — Other Ambulatory Visit: Payer: Self-pay | Admitting: Internal Medicine

## 2019-09-14 MED ORDER — ACETAMINOPHEN-CODEINE #3 300-30 MG PO TABS: 2.0000 | ORAL_TABLET | Freq: Two times a day (BID) | ORAL | 0 refills | Status: DC | PRN

## 2019-09-14 NOTE — Progress Notes (Signed)
Received call from patient requesting a medication refill for:  Acetaminophen-codeine 300-30 mg, 1 tablet q6h PRN, #120  Per PMP: last filled 07/24/2019 for #120 tablets  Will forward request to PCP.

## 2019-10-16 ENCOUNTER — Other Ambulatory Visit: Payer: Self-pay | Admitting: Internal Medicine

## 2019-10-23 ENCOUNTER — Other Ambulatory Visit: Payer: Self-pay

## 2019-10-23 ENCOUNTER — Telehealth (INDEPENDENT_AMBULATORY_CARE_PROVIDER_SITE_OTHER): Payer: Medicare Other | Admitting: Neurology

## 2019-10-23 ENCOUNTER — Encounter: Payer: Self-pay | Admitting: Neurology

## 2019-10-23 VITALS — Ht 63.0 in | Wt 275.0 lb

## 2019-10-23 DIAGNOSIS — G40309 Generalized idiopathic epilepsy and epileptic syndromes, not intractable, without status epilepticus: Secondary | ICD-10-CM

## 2019-10-23 DIAGNOSIS — R251 Tremor, unspecified: Secondary | ICD-10-CM

## 2019-10-23 DIAGNOSIS — G629 Polyneuropathy, unspecified: Secondary | ICD-10-CM | POA: Diagnosis not present

## 2019-10-23 DIAGNOSIS — G43109 Migraine with aura, not intractable, without status migrainosus: Secondary | ICD-10-CM

## 2019-10-23 NOTE — Progress Notes (Signed)
Pt called at 8:53 no answer to update chart  Pt called back at 9:53 cart was updated

## 2019-10-23 NOTE — Progress Notes (Signed)
Virtual Visit via Video Note The purpose of this virtual visit is to provide medical care while limiting exposure to the novel coronavirus.    Consent was obtained for video visit:  Yes.   Answered questions that patient had about telehealth interaction:  Yes.   I discussed the limitations, risks, security and privacy concerns of performing an evaluation and management service by telemedicine. I also discussed with the patient that there may be a patient responsible charge related to this service. The patient expressed understanding and agreed to proceed.  Pt location: Home Physician Location: office Name of referring provider:  Binnie Rail, MD I connected with [name] at patients initiation/request on 10/23/2019 at 10:00 AM EDT by video enabled telemedicine application and verified that I am speaking with the correct person using two identifiers. Pt MRN:  [id number] Pt DOB:  [birth year] Video Participants:  [name]   History of Present Illness:  The patient was seen as a virtual video visit on 10/23/2019. She was last seen 7 months ago for history of seizures, migraines, tremors, and neuropathy. 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 She had one fall since her last visit, she fell while going up stairs a couple of weeks ago. She was in the hospital in July with a GI viral illness, at that time she had a lot of leg swelling as well and gabapentin was discontinued. Off gabapentin she states her neuropathy is bad, but the pain is not unbearable. Voltaren gel helps. She does not have good balance at all but has learned to live with it, she is very careful and uses the rolling walker from the hospital when she goes out. The migraines are well-controlled, she takes Tylenol infrequently at headache onset, which helps. She had a bad headache yesterday. She is on Topiramate 160m BID without side effects. She denies any seizures or seizure-like  symptoms since 2002 on Depakote 5011mBID. She denies any staring/unresponsive episodes, confusion. Tremors are really bad, she has difficulty holding on to things at times. There is significant tremor as she holds the camera phone for today's visit. She denies any neck/back pain.    History on Initial Assessment 11/28/2014: This is a pleasant [age]o RH woman with a history of hypertension, hyperlipidemia, diabetes, obesity, sleep apnea, CAD s/p MI, with seizures and migraines. She reports seizures started at age [age]she woke up in the hospital with no prior warning symptoms. Since then, she has had 4 generalized tonic-clonic seizures, last was in 2002. She also report 5 or 6 "little episodes" where she feels dizzy, with blurred vision, then if she sits down quickly and tries to relax, she can avoid any progression. She would feel briefly confused. No associated olfactory/gustatory hallucinations, focal numbness/tingling/weakness, myoclonic jerks. The last time she had these episodes was at least 12 years ago. She recalls taking Neurontin initially, which did not help. Topamax in the past which caused GI symptoms. She has been taking Depakote ER 50025mn AM, 1000m23m PM for many years now with no side effects. She reports having brain imaging and EEG in the past which were normal, no records available for review.   She reports migraines started at age 69. 57e has an aura of numbness in her face, cheeks, and temple, then she becomes very sensitive to lights, sounds, and smells. She sees flashing lights then her vision becomes blurred, followed by throbbing headaches in the frontal or occipital regions with  associated nausea. No associated focal numbness/tingling/weakness in the extremities. She tried Imitrex with minimal effect. A course of Prednisone   She has chronic neck pain and has been dealing with diarrhea from IBS. She had an MI in 2010.   Epilepsy Risk Factors:  There is a strong family history of  seizures in her paternal grandfather, paternal aunt, nephew. Otherwise she had a normal birth and early development.  There is no history of febrile convulsions, CNS infections such as meningitis/encephalitis, significant traumatic brain injury, neurosurgical procedures.    Current Outpatient Medications on File Prior to Visit  Medication Sig Dispense Refill   acetaminophen (TYLENOL) 500 MG tablet Take 1 tablet (500 mg total) by mouth every 6 (six) hours as needed. 30 tablet 3   Acetaminophen-Codeine 300-30 MG tablet TAKE 2 TABLETS BY MOUTH 2 (TWO) TIMES DAILY AS NEEDED FOR MODERATE PAIN. FOR CHRONIC HIP PAIN 120 tablet 0   blood glucose meter kit and supplies KIT Dispense based on patient and insurance preference. Use up to four times daily as directed. (FOR E11.9). 1 each 0   divalproex (DEPAKOTE) 500 MG DR tablet Take 1 tab twice a day 180 tablet 3   furosemide (LASIX) 20 MG tablet Take 1 tablet (20 mg total) by mouth daily. 90 tablet 1   glipiZIDE (GLUCOTROL) 10 MG tablet TAKE 1 TABLET (10 MG TOTAL) BY MOUTH 2 (TWO) TIMES DAILY BEFORE A MEAL. 180 tablet 1   levothyroxine (SYNTHROID) 75 MCG tablet TAKE 1 TABLET BY MOUTH ONCE DAILY BEFORE BREAKFAST 90 tablet 1   loperamide (IMODIUM) 2 MG capsule Take 1 capsule (2 mg total) by mouth 4 (four) times daily as needed for diarrhea or loose stools. 12 capsule 0   losartan (COZAAR) 50 MG tablet TAKE 1 TABLET BY MOUTH EVERY DAY 90 tablet 1   ondansetron (ZOFRAN-ODT) 4 MG disintegrating tablet Take 1 tablet (4 mg total) by mouth every 8 (eight) hours as needed for nausea or vomiting. 20 tablet 0   oxybutynin (DITROPAN XL) 15 MG 24 hr tablet Take 1 tablet (15 mg total) by mouth at bedtime. 30 tablet 2   sertraline (ZOLOFT) 100 MG tablet Take 1.5 tablets (150 mg total) by mouth daily. 135 tablet 1   simvastatin (ZOCOR) 20 MG tablet TAKE 1 TABLET BY MOUTH EVERYDAY AT BEDTIME 90 tablet 1   topiramate (TOPAMAX) 100 MG tablet Take 1 tablet (100 mg  total) by mouth 2 (two) times daily. 180 tablet 3   traZODone (DESYREL) 50 MG tablet TAKE 2 TABLETS BY MOUTH AT BEDTIME FOR SLEEP 180 tablet 0   metFORMIN (GLUCOPHAGE) 1000 MG tablet Take 1 tablet (1,000 mg total) by mouth 2 (two) times daily with a meal. (Patient not taking: Reported on 10/23/2019) 360 tablet 1   omeprazole (PRILOSEC) 40 MG capsule TAKE 1 CAPSULE BY MOUTH EVERY DAY (Patient not taking: Reported on 10/23/2019) 90 capsule 1   No current facility-administered medications on file prior to visit.     Observations/Objective:   Vitals:   10/23/19 0952  Weight: 275 lb (124.7 kg)  Height: '5\' 3"'  (1.6 m)   GEN:  The patient appears stated age and is in NAD.  Neurological examination: Patient is awake, alert, oriented x 3. No aphasia or dysarthria. Intact fluency and comprehension. Remote and recent memory intact. Cranial nerves: Extraocular movements intact with no nystagmus. No facial asymmetry. Motor: moves all extremities symmetrically, at least anti-gravity x 4. No incoordination on finger to nose testing. Gait: slow and cautious,  no ataxia. +tremor while holding phone (unable to fully evaluate on video). Good finger taps.    Assessment and Plan:   This is a pleasant 69 yo RH woman with a history of hypertension, hyperlipidemia, obesity, sleep apnea on CPAP, CAD s/p MI, with a history of convulsive seizures, well-controlled on Depakote with no seizures since 2002. She has had migraines since her teenage years, she has had good response Topiramate. She is reporting more tremors, increase Topiramate to 177m BID for 1 month, then 2030mBID. She had edema with gabapentin, neuropathic pain bearable, she has Voltaren gel prn. Continue home balance exercises. We may consider weaning off Depakote 50037mID in the future as it is likely contributing to tremor as well. Follow-up in 6 months, she knows to call for any changes.    Follow Up Instructions:   -I discussed the assessment and  treatment plan with the patient. The patient was provided an opportunity to ask questions and all were answered. The patient agreed with the plan and demonstrated an understanding of the instructions.   The patient was advised to call back or seek an in-person evaluation if the symptoms worsen or if the condition fails to improve as anticipated.    KarCameron SprangD

## 2019-11-05 ENCOUNTER — Ambulatory Visit: Payer: Medicare Other | Admitting: Internal Medicine

## 2019-11-06 ENCOUNTER — Other Ambulatory Visit: Payer: Self-pay | Admitting: Internal Medicine

## 2019-11-07 ENCOUNTER — Other Ambulatory Visit: Payer: Self-pay | Admitting: Internal Medicine

## 2019-11-12 ENCOUNTER — Other Ambulatory Visit: Payer: Self-pay

## 2019-11-12 ENCOUNTER — Other Ambulatory Visit: Payer: Self-pay | Admitting: Neurology

## 2019-11-12 MED ORDER — TOPIRAMATE 200 MG PO TABS: 200.0000 mg | ORAL_TABLET | Freq: Two times a day (BID) | ORAL | 1 refills | Status: DC

## 2019-11-12 NOTE — Telephone Encounter (Signed)
Patient states that her Topiramate medication was recently increased, so now she is needing a refill with a new prescription for the increased amount sent to [facility] on [address].

## 2019-11-12 NOTE — Telephone Encounter (Signed)
Pt called and informed that new script for topiramate was sent in for 200mg  BID to pharmacy on file and for her to note the change it was a 200mg  tab not a 100mg  tab to only take one tab twice a day, pt verbalized understanding,

## 2019-11-14 DIAGNOSIS — H04123 Dry eye syndrome of bilateral lacrimal glands: Secondary | ICD-10-CM | POA: Diagnosis not present

## 2019-11-14 DIAGNOSIS — H26493 Other secondary cataract, bilateral: Secondary | ICD-10-CM | POA: Diagnosis not present

## 2019-11-14 DIAGNOSIS — H40013 Open angle with borderline findings, low risk, bilateral: Secondary | ICD-10-CM | POA: Diagnosis not present

## 2019-11-14 DIAGNOSIS — E119 Type 2 diabetes mellitus without complications: Secondary | ICD-10-CM | POA: Diagnosis not present

## 2019-11-14 LAB — HM DIABETES EYE EXAM

## 2019-11-18 ENCOUNTER — Other Ambulatory Visit: Payer: Self-pay | Admitting: Internal Medicine

## 2019-11-22 ENCOUNTER — Other Ambulatory Visit: Payer: Self-pay | Admitting: Internal Medicine

## 2019-11-27 ENCOUNTER — Ambulatory Visit (INDEPENDENT_AMBULATORY_CARE_PROVIDER_SITE_OTHER): Payer: Medicare Other

## 2019-11-27 DIAGNOSIS — Z0000 Encounter for general adult medical examination without abnormal findings: Secondary | ICD-10-CM

## 2019-11-27 NOTE — Patient Instructions (Signed)
Alexis Grant , Thank you for taking time to come for your Medicare Wellness Visit. I appreciate your ongoing commitment to your health goals. Please review the following plan we discussed and let me know if I can assist you in the future.   Screening recommendations/referrals: Colonoscopy: 09/21/2016; due every 10 years Mammogram: 12/06/2017 Bone Density: 11/17/2016 Recommended yearly ophthalmology/optometry visit for glaucoma screening and checkup Recommended yearly dental visit for hygiene and checkup  Vaccinations: Influenza vaccine: 12/22/2018 Pneumococcal vaccine: up to date Tdap vaccine: overdue Shingles vaccine: never done   Covid-19: up to date  Advanced directives: Advance directive discussed with you today. I have provided a copy for you to complete at home and have notarized. Once this is complete please bring a copy in to our office so we can scan it into your chart.  Conditions/risks identified: Yes. Reviewed health maintenance screenings with patient today and relevant education, vaccines, and/or referrals were provided. Continue doing brain stimulating activities (puzzles, reading, adult coloring books, staying active) to keep memory sharp. Continue to eat heart healthy diet (full of fruits, vegetables, whole grains, lean protein, water--limit salt, fat, and sugar intake) and increase physical activity as tolerated.  Next appointment: 11/27/2020 at 10:30 am.   Preventive Care 65 Years and Older, Female Preventive care refers to lifestyle choices and visits with your health care provider that can promote health and wellness. What does preventive care include?  A yearly physical exam. This is also called an annual well check.  Dental exams once or twice a year.  Routine eye exams. Ask your health care provider how often you should have your eyes checked.  Personal lifestyle choices, including:  Daily care of your teeth and gums.  Regular physical activity.  Eating a  healthy diet.  Avoiding tobacco and drug use.  Limiting alcohol use.  Practicing safe sex.  Taking low-dose aspirin every day.  Taking vitamin and mineral supplements as recommended by your health care provider. What happens during an annual well check? The services and screenings done by your health care provider during your annual well check will depend on your age, overall health, lifestyle risk factors, and family history of disease. Counseling  Your health care provider may ask you questions about your:  Alcohol use.  Tobacco use.  Drug use.  Emotional well-being.  Home and relationship well-being.  Sexual activity.  Eating habits.  History of falls.  Memory and ability to understand (cognition).  Work and work Statistician.  Reproductive health. Screening  You may have the following tests or measurements:  Height, weight, and BMI.  Blood pressure.  Lipid and cholesterol levels. These may be checked every 5 years, or more frequently if you are over 47 years old.  Skin check.  Lung cancer screening. You may have this screening every year starting at age 78 if you have a 30-pack-year history of smoking and currently smoke or have quit within the past 15 years.  Fecal occult blood test (FOBT) of the stool. You may have this test every year starting at age 1.  Flexible sigmoidoscopy or colonoscopy. You may have a sigmoidoscopy every 5 years or a colonoscopy every 10 years starting at age 61.  Hepatitis C blood test.  Hepatitis B blood test.  Sexually transmitted disease (STD) testing.  Diabetes screening. This is done by checking your blood sugar (glucose) after you have not eaten for a while (fasting). You may have this done every 1-3 years.  Bone density scan. This is done to  screen for osteoporosis. You may have this done starting at age 88.  Mammogram. This may be done every 1-2 years. Talk to your health care provider about how often you should  have regular mammograms. Talk with your health care provider about your test results, treatment options, and if necessary, the need for more tests. Vaccines  Your health care provider may recommend certain vaccines, such as:  Influenza vaccine. This is recommended every year.  Tetanus, diphtheria, and acellular pertussis (Tdap, Td) vaccine. You may need a Td booster every 10 years.  Zoster vaccine. You may need this after age 47.  Pneumococcal 13-valent conjugate (PCV13) vaccine. One dose is recommended after age 62.  Pneumococcal polysaccharide (PPSV23) vaccine. One dose is recommended after age 88. Talk to your health care provider about which screenings and vaccines you need and how often you need them. This information is not intended to replace advice given to you by your health care provider. Make sure you discuss any questions you have with your health care provider. Document Released: 01/31/2015 Document Revised: 09/24/2015 Document Reviewed: 11/05/2014 Elsevier Interactive Patient Education  2017 [place] Prevention in the Home Falls can cause injuries. They can happen to people of all ages. There are many things you can do to make your home safe and to help prevent falls. What can I do on the outside of my home?  Regularly fix the edges of walkways and driveways and fix any cracks.  Remove anything that might make you trip as you walk through a door, such as a raised step or threshold.  Trim any bushes or trees on the path to your home.  Use bright outdoor lighting.  Clear any walking paths of anything that might make someone trip, such as rocks or tools.  Regularly check to see if handrails are loose or broken. Make sure that both sides of any steps have handrails.  Any raised decks and porches should have guardrails on the edges.  Have any leaves, snow, or ice cleared regularly.  Use sand or salt on walking paths during winter.  Clean up any spills in  your garage right away. This includes oil or grease spills. What can I do in the bathroom?  Use night lights.  Install grab bars by the toilet and in the tub and shower. Do not use towel bars as grab bars.  Use non-skid mats or decals in the tub or shower.  If you need to sit down in the shower, use a plastic, non-slip stool.  Keep the floor dry. Clean up any water that spills on the floor as soon as it happens.  Remove soap buildup in the tub or shower regularly.  Attach bath mats securely with double-sided non-slip rug tape.  Do not have throw rugs and other things on the floor that can make you trip. What can I do in the bedroom?  Use night lights.  Make sure that you have a light by your bed that is easy to reach.  Do not use any sheets or blankets that are too big for your bed. They should not hang down onto the floor.  Have a firm chair that has side arms. You can use this for support while you get dressed.  Do not have throw rugs and other things on the floor that can make you trip. What can I do in the kitchen?  Clean up any spills right away.  Avoid walking on wet floors.  Keep items that  you use a lot in easy-to-reach places.  If you need to reach something above you, use a strong step stool that has a grab bar.  Keep electrical cords out of the way.  Do not use floor polish or wax that makes floors slippery. If you must use wax, use non-skid floor wax.  Do not have throw rugs and other things on the floor that can make you trip. What can I do with my stairs?  Do not leave any items on the stairs.  Make sure that there are handrails on both sides of the stairs and use them. Fix handrails that are broken or loose. Make sure that handrails are as long as the stairways.  Check any carpeting to make sure that it is firmly attached to the stairs. Fix any carpet that is loose or worn.  Avoid having throw rugs at the top or bottom of the stairs. If you do have  throw rugs, attach them to the floor with carpet tape.  Make sure that you have a light switch at the top of the stairs and the bottom of the stairs. If you do not have them, ask someone to add them for you. What else can I do to help prevent falls?  Wear shoes that:  Do not have high heels.  Have rubber bottoms.  Are comfortable and fit you well.  Are closed at the toe. Do not wear sandals.  If you use a stepladder:  Make sure that it is fully opened. Do not climb a closed stepladder.  Make sure that both sides of the stepladder are locked into place.  Ask someone to hold it for you, if possible.  Clearly mark and make sure that you can see:  Any grab bars or handrails.  First and last steps.  Where the edge of each step is.  Use tools that help you move around (mobility aids) if they are needed. These include:  Canes.  Walkers.  Scooters.  Crutches.  Turn on the lights when you go into a dark area. Replace any light bulbs as soon as they burn out.  Set up your furniture so you have a clear path. Avoid moving your furniture around.  If any of your floors are uneven, fix them.  If there are any pets around you, be aware of where they are.  Review your medicines with your doctor. Some medicines can make you feel dizzy. This can increase your chance of falling. Ask your doctor what other things that you can do to help prevent falls. This information is not intended to replace advice given to you by your health care provider. Make sure you discuss any questions you have with your health care provider. Document Released: 10/31/2008 Document Revised: 06/12/2015 Document Reviewed: 02/08/2014 Elsevier Interactive Patient Education  2017 [employer].

## 2019-11-27 NOTE — Progress Notes (Signed)
I connected with Alexis Grant today by telephone and verified that I am speaking with the correct person using two identifiers. Location patient: home Location provider: work Persons participating in the virtual visit: Alexis Grant and Alexis Abu, LPN.   I discussed the limitations, risks, security and privacy concerns of performing an evaluation and management service by telephone and the availability of in person appointments. I also discussed with the patient that there may be a patient responsible charge related to this service. The patient expressed understanding and verbally consented to this telephonic visit.    Interactive audio and video telecommunications were attempted between this provider and patient, however failed, due to patient having technical difficulties OR patient did not have access to video capability.  We continued and completed visit with audio only.  Some vital signs may be absent or patient reported.   Time Spent with patient on telephone encounter: 30 minutes  Subjective:   Alexis Grant is a 69 y.o. female who presents for Medicare Annual (Subsequent) preventive examination.  Review of Systems    No ROS. Medicare Wellness Visit. Additional risk factors are reflected in social history. Cardiac Risk Factors include: advanced age (>15mn, >>97women);diabetes mellitus;dyslipidemia;hypertension;obesity (BMI >30kg/m2) Sleep Patterns: No sleep issues, feels rested on waking and sleeps 5-6 hours nightly. Home Safety/Smoke Alarms: Feels safe in home; uses home alarm. Smoke alarms in place. Living environment: 1-story home; Lives alone; needs for DME; good support system. Seat Belt Safety/Bike Helmet: Wears seat belt.    Objective:    Today's Vitals   11/27/19 1058  PainSc: 8    There is no height or weight on file to calculate BMI.  Advanced Directives 11/27/2019 10/23/2019 08/11/2019 03/21/2019 11/22/2018 01/09/2018 11/16/2017  Does Patient Have a  Medical Advance Directive? No No No No No No No  Copy of Healthcare Power of Attorney in Chart? - - - - - - -  Would patient like information on creating a medical advance directive? Yes (MAU/Ambulatory/Procedural Areas - Information given) - No - Patient declined - No - Patient declined No - Patient declined Yes (ED - Information included in AVS)    Current Medications (verified) Outpatient Encounter Medications as of 11/27/2019  Medication Sig  . Acetaminophen-Codeine 300-30 MG tablet TAKE 2 TABLETS BY MOUTH 2 (TWO) TIMES DAILY AS NEEDED FOR MODERATE PAIN. FOR CHRONIC HIP PAIN  . traZODone (DESYREL) 50 MG tablet TAKE 2 TABLETS BY MOUTH AT BEDTIME FOR SLEEP  . acetaminophen (TYLENOL) 500 MG tablet Take 1 tablet (500 mg total) by mouth every 6 (six) hours as needed.  . blood glucose meter kit and supplies KIT Dispense based on patient and insurance preference. Use up to four times daily as directed. (FOR E11.9).  .Marland Kitchendivalproex (DEPAKOTE) 500 MG DR tablet Take 1 tab twice a day  . fluorometholone (FML) 0.1 % ophthalmic suspension SMARTSIG:In Eye(s)  . furosemide (LASIX) 20 MG tablet Take 1 tablet (20 mg total) by mouth daily.  .Marland KitchenglipiZIDE (GLUCOTROL) 10 MG tablet TAKE 1 TABLET (10 MG TOTAL) BY MOUTH 2 (TWO) TIMES DAILY BEFORE A MEAL.  .Marland Kitchenlevothyroxine (SYNTHROID) 75 MCG tablet TAKE 1 TABLET BY MOUTH ONCE DAILY BEFORE BREAKFAST  . loperamide (IMODIUM) 2 MG capsule Take 1 capsule (2 mg total) by mouth 4 (four) times daily as needed for diarrhea or loose stools.  .Marland Kitchenlosartan (COZAAR) 50 MG tablet TAKE 1 TABLET BY MOUTH EVERY DAY  . metFORMIN (GLUCOPHAGE) 1000 MG tablet TAKE 1 TABLET (1,000 MG TOTAL)  BY MOUTH 2 (TWO) TIMES DAILY WITH A MEAL.  [name] omeprazole (PRILOSEC) 40 MG capsule TAKE 1 CAPSULE BY MOUTH EVERY DAY (Patient not taking: Reported on 10/23/2019)  . ondansetron (ZOFRAN-ODT) 4 MG disintegrating tablet Take 1 tablet (4 mg total) by mouth every 8 (eight) hours as needed for nausea or vomiting.  [name]  oxybutynin (DITROPAN XL) 15 MG 24 hr tablet TAKE 1 TABLET BY MOUTH EVERYDAY AT BEDTIME  . sertraline (ZOLOFT) 100 MG tablet TAKE 1 TABLET BY MOUTH EVERY DAY  . simvastatin (ZOCOR) 20 MG tablet TAKE 1 TABLET BY MOUTH EVERYDAY AT BEDTIME  . topiramate (TOPAMAX) 200 MG tablet Take 1 tablet (200 mg total) by mouth 2 (two) times daily. Note change in dose.. pt know that this is a 233m pill not the 1050mshe is use to taken,   No facility-administered encounter medications on file as of 11/27/2019.    Allergies (verified) Hydrocodone-acetaminophen   History: Past Medical History:  Diagnosis Date  . Adenomatous polyp of colon 2008  . Allergy   . Anemia   . Anxiety   . Arthritis   . Blood transfusion without reported diagnosis    following knee surgery   . Cataract    hx- removed both eyes  . Depression   . Diabetes mellitus    type II   . Dyslipidemia   . Fibromyalgia   . Gallstones   . Gastritis   . GERD (gastroesophageal reflux disease)   . Hemangioma   . Hiatal hernia    Fibromyalgia  . Hyperlipidemia   . Hypertension   . Hypothyroidism 2010   Dr.[place]  . IBS (irritable bowel syndrome)    Dr StFuller Plan. Migraines   . [place]   subendocardrial, following R TKR  . Obesity   . OSA (obstructive sleep apnea)    cpap - does not know settings   . Pneumonia   . [place] ([facility]  . [place]izures ([facility]   last seizure was 2002 per pt 07-29-16  . Sleep apnea   . Urinary tract infection    Past Surgical History:  Procedure Laterality Date  . ABDOMINAL HYSTERECTOMY  1988  . CARDIAC CATHETERIZATION  2007   DrGamble ( now seeing Dr HaTonita [facility])  . CHOLECYSTECTOMY  1985  . COLONOSCOPY    . COLONOSCOPY WITH PROPOFOL N/A 07/16/2014   Procedure: COLONOSCOPY WITH PROPOFOL;  Surgeon: MaLadene ArtistMD;  Location: [facility];  Service: Endoscopy;  Laterality: N/A;  . COLONOSCOPY WITH PROPOFOL N/A 09/21/2016   Procedure: COLONOSCOPY WITH  PROPOFOL;  Surgeon: StLadene ArtistMD;  Location: [facility];  Service: Endoscopy;  Laterality: N/A;  . POLYPECTOMY  2012   6 or 7 adenomatous, Dr.Stark  . steroid injection to left si joint  12/2008   Dr.Newton   . TOTAL KNEE ARTHROPLASTY Right 11/2005  . TOTAL KNEE ARTHROPLASTY Left   . TUBAL LIGATION  1986   Family History  Problem Relation Age of Onset  . Colon polyps Mother   . Ulcers Father   . Colon polyps Brother   . Diabetes Sister   . Kidney failure Sister   . Diabetes Paternal Aunt   . Esophageal cancer Brother   . Kidney cancer Sister   . Renal cancer Sister   . Colon cancer Neg Hx   . Heart attack Neg Hx   . Stroke Neg Hx   . Cancer Neg Hx   . Gallbladder disease Neg Hx   .  Rectal cancer Neg Hx   . Stomach cancer Neg Hx    Social History   Socioeconomic History  . Marital status: Widowed    Spouse name: Not on file  . Number of children: 3  . Years of education: college  . Highest education level: Not on file  Occupational History  . Occupation: Retired  Tobacco Use  . Smoking status: Never Smoker  . Smokeless tobacco: Never Used  Vaping Use  . Vaping Use: Never used  Substance and Sexual Activity  . Alcohol use: No    Alcohol/week: 0.0 standard drinks  . Drug use: No  . Sexual activity: Not Currently  Other Topics Concern  . Not on file  Social History Narrative   Illicit drug use- no   Patient does not get regular exercise due to knee.   Patient lives at home alone.    Patient son and wife lives next door.   Patient has 3 children.    Patient has some college.    Patient retired May 2014.    Social Determinants of Health   Financial Resource Strain: Low Risk   . Difficulty of Paying Living Expenses: Not hard at all  Food Insecurity: No Food Insecurity  . Worried About Charity fundraiser in the Last Year: Never true  . Ran Out of Food in the Last Year: Never true  Transportation Needs: No Transportation Needs  . Lack of  Transportation (Medical): No  . Lack of Transportation (Non-Medical): No  Physical Activity: Insufficiently Active  . Days of Exercise per Week: 7 days  . Minutes of Exercise per Session: 20 min  Stress: Stress Concern Present  . Feeling of Stress : Rather much  Social Connections: Socially Isolated  . Frequency of Communication with Friends and Family: More than three times a week  . Frequency of Social Gatherings with Friends and Family: Once a week  . Attends Religious Services: Never  . Active Member of Clubs or Organizations: No  . Attends Archivist Meetings: Never  . Marital Status: Widowed    Tobacco Counseling Counseling given: Not Answered   Clinical Intake:  Pre-visit preparation completed: Yes  Pain : 0-10 Pain Score: 8  Pain Type: Chronic pain Pain Location: Hip (bilateral lower ext) Pain Orientation: Right, Left Pain Descriptors / Indicators: Burning, Aching, Discomfort, Numbness, Tingling Pain Onset: More than a month ago Pain Frequency: Intermittent Pain Relieving Factors: Pain medication Effect of Pain on Daily Activities: Pain can diminish job performance, lower motivation to exercise, and prevent you from completing daily tasks.  Pain Relieving Factors: Pain medication  Nutritional Risks: Other (Comment) (decreased appetite) Diabetes: Yes CBG done?: No Did pt. bring in CBG monitor from home?: No  How often do you need to have someone help you when you read instructions, pamphlets, or other written materials from your doctor or pharmacy?: 1 - Never What is the last grade level you completed in school?: HSG; some college  Diabetic? yes  Interpreter Needed?: No  Information entered by :: Alexis Abu, LPN   Activities of Daily Living In your present state of health, do you have any difficulty performing the following activities: 11/27/2019 08/13/2019  Hearing? N N  Vision? N N  Difficulty concentrating or making decisions? Y N    Walking or climbing stairs? Y N  Comment uses a cane or walker for gait/balance -  Dressing or bathing? N N  Doing errands, shopping? N N  Preparing Food and eating ?  N -  Using the Toilet? N -  In the past six months, have you accidently leaked urine? Y -  Comment wears a diaper for protection -  Do you have problems with loss of bowel control? N -  Managing your Medications? N -  Managing your Finances? N -  Housekeeping or managing your Housekeeping? N -  Some recent data might be hidden    Patient Care Team: Binnie Rail, MD as PCP - General (Internal Medicine) Cameron Sprang, MD as Consulting Physician (Neurology) Garald Balding, MD as Consulting Physician (Orthopedic Surgery) Chesley Mires, MD as Consulting Physician (Pulmonary Disease) Monna Fam, MD as Consulting Physician (Ophthalmology) Cameron Sprang, MD as Consulting Physician (Neurology) Charlton Haws, [facility] as Pharmacist (Pharmacist)  Indicate any recent Medical Services you may have received from other than [facility] providers in the past year (date may be approximate).     Assessment:   This is a routine wellness examination for [place].  Hearing/Vision screen No exam data present  Dietary issues and exercise activities discussed: Current Exercise Habits: Home exercise routine, Type of exercise: walking, Time (Minutes): 20, Frequency (Times/Week): 7, Weekly Exercise (Minutes/Week): 140, Intensity: Mild, Exercise limited by: psychological condition(s);orthopedic condition(s)  Goals    . Patient Stated     Increase my social activity by perhaps looking into the senior center.     [name] Pharmacy Care Plan     CARE PLAN ENTRY (see longitudinal plan of care for additional care plan information)  Current Barriers:  . Chronic Disease Management support, education, and care coordination needs related to Hypertension, Hyperlipidemia, Diabetes, and GERD   Hypertension BP Readings from Last 3 Encounters:   08/22/19 128/84  08/15/19 (!) 157/69  06/01/19 128/74 .  Pharmacist Clinical Goal(s): o Over the next 30 days, patient will work with PharmD and providers to maintain BP goal <130/80 . Current regimen:  . Losartan 50 mg daily . Interventions: o Discussed BP goals and benefits of medications for prevention of heart attack / stroke o Discussed stress, pain can increase blood pressure temporarily . Patient self care activities - Over the next 30 days, patient will: o Check BP daily, document, and provide at future appointments o Ensure daily salt intake < 2300 mg/day  Hyperlipidemia / CAD Lab Results  Component Value Date/Time   LDLCALC 81 06/01/2019 11:52 AM .  Pharmacist Clinical Goal(s): o Over the next 90 days, patient will work with PharmD and providers to achieve LDL goal < 70 . Current regimen:  o Simvastatin 20 mg daily . Interventions: o Discussed cholesterol goals and benefits of medications for prevention of heart attack / stroke . Patient self care activities - Over the next 90 days, patient will: o Continue medication as prescribed  Diabetes Lab Results  Component Value Date/Time   HGBA1C 5.8 (H) 08/12/2019 12:12 AM   HGBA1C 5.9 06/01/2019 11:52 AM .  Pharmacist Clinical Goal(s): o Over the next 30 days, patient will work with PharmD and providers to maintain A1c goal <7% . Current regimen:  o Metformin 1000 mg twice a day o Glipizide 10 mg twice a day . Interventions: o Discussed A1c goals and benefits of medications for diabetic complications o Recommended to hold metformin due to diarrhea and inability to keep food down . Patient self care activities - Over the next 30 days, patient will: o Check blood sugar once daily and in the morning before eating or drinking, document, and provide at future appointments o Contact  provider with any episodes of hypoglycemia  GERD . Pharmacist Clinical Goal(s) o Over the next 30 days, patient will work with PharmD and  providers to optimize therapy . Current regimen:  o Omeprazole 40 mg daily . Interventions: o Recommend trial off PPI to determine necessity . Patient self care activities - Over the next 30 days, patient will: o Take omeprazole every other day x 1 week, then stop and monitor for heartburn / reflux symptoms  Medication management . Pharmacist Clinical Goal(s): o Over the next 30 days, patient will work with PharmD and providers to maintain optimal medication adherence . Current pharmacy: [facility] . Interventions o Comprehensive medication review performed. o Continue current medication management strategy . Patient self care activities - Over the next 30 days, patient will: o Focus on medication adherence by fill date o Take medications as prescribed o Report any questions or concerns to PharmD and/or provider(s)  Initial goal documentation      Depression Screen PHQ 2/9 Scores 11/27/2019 11/22/2018 11/16/2017 11/15/2016  PHQ - 2 Score '1 1 3 ' 0  PHQ- 9 Score - 7 9 -    Fall Risk Fall Risk  11/27/2019 10/23/2019 06/01/2019 03/21/2019 11/22/2018  Falls in the past year? '1 1 1 1 1  ' Number falls in past yr: 1 1 0 1 1  Injury with Fall? 0 1 0 0 1  Comment - - - - -  Risk Factor Category  - - - - -  Risk for fall due to : Impaired balance/gait;Orthopedic patient History of fall(s);Impaired balance/gait;Impaired mobility - - Impaired balance/gait;Impaired mobility;History of fall(s)  Follow up Falls evaluation completed - Falls evaluation completed - Falls prevention discussed    Any stairs in or around the home? No  If so, are there any without handrails? No  Home free of loose throw rugs in walkways, pet beds, electrical cords, etc? Yes  Adequate lighting in your home to reduce risk of falls? Yes   ASSISTIVE DEVICES UTILIZED TO PREVENT FALLS:  Life alert? No  Use of a cane, walker or w/c? Yes  Grab bars in the bathroom? Yes  Shower chair or bench in shower? Yes  Elevated toilet seat  or a handicapped toilet? Yes   TIMED UP AND GO:  Was the test performed? No .  Length of time to ambulate 10 feet: 0 sec.   Gait slow and steady with assistive device  Cognitive Function: Patient is cogitatively intact.        Immunizations Immunization History  Administered Date(s) Administered  . Fluad Quad(high Dose 65+) 12/22/2018  . Influenza Split 10/26/2011  . Influenza, High Dose Seasonal PF 11/15/2016, 09/27/2017  . Influenza,inj,Quad PF,6+ Mos 10/25/2012, 11/25/2015  . Influenza-Unspecified 10/19/2014  . PFIZER SARS-COV-2 Vaccination 04/27/2019, 05/25/2019  . Pneumococcal Conjugate-13 05/24/2016  . Pneumococcal Polysaccharide-23 06/27/2017  . Td 10/04/2008    TDAP status: Due, Education has been provided regarding the importance of this vaccine. Advised may receive this vaccine at local pharmacy or Health Dept. Aware to provide a copy of the vaccination record if obtained from local pharmacy or Health Dept. Verbalized acceptance and understanding. Flu Vaccine status: Up to date Pneumococcal vaccine status: Up to date Covid-19 vaccine status: Completed vaccines  Qualifies for Shingles Vaccine? Yes   Zostavax completed No   Shingrix Completed?: No.    Education has been provided regarding the importance of this vaccine. Patient has been advised to call insurance company to determine out of pocket expense if they have not  yet received this vaccine. Advised may also receive vaccine at local pharmacy or Health Dept. Verbalized acceptance and understanding.  Screening Tests Health Maintenance  Topic Date Due  . OPHTHALMOLOGY EXAM  03/29/2018  . TETANUS/TDAP  10/05/2018  . FOOT EXAM  11/17/2018  . INFLUENZA VACCINE  08/19/2019  . DEXA SCAN  11/18/2019  . MAMMOGRAM  11/30/2019  . HEMOGLOBIN A1C  02/12/2020  . COLONOSCOPY  09/22/2026  . COVID-19 Vaccine  Completed  . Hepatitis C Screening  Completed  . PNA vac Low Risk Adult  Completed    Health  Maintenance  Health Maintenance Due  Topic Date Due  . OPHTHALMOLOGY EXAM  03/29/2018  . TETANUS/TDAP  10/05/2018  . FOOT EXAM  11/17/2018  . INFLUENZA VACCINE  08/19/2019  . DEXA SCAN  11/18/2019    Colorectal cancer screening: Completed 09/21/2016. Repeat every 10 years Mammogram status: Completed 12/06/2017. Repeat every year Bone Density status: Completed 11/17/2016. Results reflect: Bone density results: OSTEOPENIA. Repeat every 3 years.  Lung Cancer Screening: (Low Dose CT Chest recommended if Age 71-80 years, 30 pack-year currently smoking OR have quit w/in 15years.) does not qualify.   Lung Cancer Screening Referral: no  Additional Screening:  Hepatitis C Screening: does not qualify; Completed : no  Vision Screening: Recommended annual ophthalmology exams for early detection of glaucoma and other disorders of the eye. Is the patient up to date with their annual eye exam?  Yes  Who is the provider or what is the name of the office in which the patient attends annual eye exams? Monna Fam, MD If pt is not established with a provider, would they like to be referred to a provider to establish care? No .   Dental Screening: Recommended annual dental exams for proper oral hygiene  Community Resource Referral / Chronic Care Management: CRR required this visit?  No   CCM required this visit?  No      Plan:     I have personally reviewed and noted the following in the patient's chart:   . Medical and social history . Use of alcohol, tobacco or illicit drugs  . Current medications and supplements . Functional ability and status . Nutritional status . Physical activity . Advanced directives . List of other physicians . Hospitalizations, surgeries, and ER visits in previous 12 months . Vitals . Screenings to include cognitive, depression, and falls . Referrals and appointments  In addition, I have reviewed and discussed with patient certain preventive protocols,  quality metrics, and best practice recommendations. A written personalized care plan for preventive services as well as general preventive health recommendations were provided to patient.     Sheral Flow, LPN   [id number]/0/1007   Nurse Notes:  Patient is cogitatively intact. There were no vitals filed for this visit. There is no height or weight on file to calculate BMI.

## 2019-12-19 ENCOUNTER — Telehealth: Payer: Medicare Other

## 2019-12-19 NOTE — Chronic Care Management (AMB) (Deleted)
Chronic Care Management Pharmacy  Name: Alexis Grant  MRN: [id number] DOB: [birth year]   Chief Complaint/ HPI  Alexis Grant,  69 y.o. , female presents for their Follow-Up CCM visit with the clinical pharmacist via telephone.  PCP : Binnie Rail, MD Patient Care Team: Binnie Rail, MD as PCP - General (Internal Medicine) Cameron Sprang, MD as Consulting Physician (Neurology) Garald Balding, MD as Consulting Physician (Orthopedic Surgery) Chesley Mires, MD as Consulting Physician (Pulmonary Disease) Monna Fam, MD as Consulting Physician (Ophthalmology) Cameron Sprang, MD as Consulting Physician (Neurology) Charlton Haws, [facility] as Pharmacist (Pharmacist)  Their chronic conditions include: Hypertension, Hyperlipidemia, Diabetes, Coronary Artery Disease, GERD, Hypothyroidism, Depression, Osteopenia, Osteoarthritis, Overactive Bladder and Allergic Rhinitis, Epilepsy, Fatty liver disease, Migraine, Fibromyalgia  Pt has lived in [place] last 20 years. Born in [place], lived in [place] as well. Husband was in shopping center business ([employer]). Kids were raised in [place]. Husband passed away this year. 3 children - 2 sons, 1 daughter in [place]. Last job in [employer], retired from there.   Pt reports GI issues/diarrhea is not improving since hospitalization in July. She spends an hour in the bathroom every morning, takes loperamide daily, and cannot keep any food down except for Jello and water. She feels weak and shaky much of the time.  Office Visits: 08/22/19 Dr Quay Burow OV: hospital f/u. Swelling improved, continue to hold gabapentin, start lasix 20 mg daily.  06/01/19 Dr Quay Burow OV: increased sertraline to 150 mg  Consult Visit: 10/23/19 Dr [name] VV (neurology): f/u for seizures, migraine, tremors. Reporting more tremors. Increased topiramate to 150 mg BID x 1 month, then 200 mg BID. Consider weaning off Depakote in future as likely contributing to  tremors.  08/11/19 - 08/15/19 Hospital admission: N/V, diarrhea unclear cause, improved with IVF. B/l leg edema unclear cause, ECHO normal EF, oral lasix did not improve. Rec'd home PT. Hold gabapentin d/t leg swelling.  04/06/19 NP Tammy Parrett (pulmonary): OSA f/u, no changes.  03/23/19 Dr [name] (neurology): f/u for neuropathy, migraine, epilepsy. Increased gabapentin to 600 mg HS. C/O tremors, depakote may be contributing, consider decreasing dose in future.   Allergies  Allergen Reactions  . Hydrocodone-Acetaminophen Itching    Itches all over; without rash per pt    Medications: Outpatient Encounter Medications as of 12/19/2019  Medication Sig  . Acetaminophen-Codeine 300-30 MG tablet TAKE 2 TABLETS BY MOUTH 2 (TWO) TIMES DAILY AS NEEDED FOR MODERATE PAIN. FOR CHRONIC HIP PAIN  . traZODone (DESYREL) 50 MG tablet TAKE 2 TABLETS BY MOUTH AT BEDTIME FOR SLEEP  . acetaminophen (TYLENOL) 500 MG tablet Take 1 tablet (500 mg total) by mouth every 6 (six) hours as needed.  . blood glucose meter kit and supplies KIT Dispense based on patient and insurance preference. Use up to four times daily as directed. (FOR E11.9).  [name] divalproex (DEPAKOTE) 500 MG DR tablet Take 1 tab twice a day  . fluorometholone (FML) 0.1 % ophthalmic suspension SMARTSIG:In Eye(s)  . furosemide (LASIX) 20 MG tablet Take 1 tablet (20 mg total) by mouth daily.  [name] glipiZIDE (GLUCOTROL) 10 MG tablet TAKE 1 TABLET (10 MG TOTAL) BY MOUTH 2 (TWO) TIMES DAILY BEFORE A MEAL.  [name] levothyroxine (SYNTHROID) 75 MCG tablet TAKE 1 TABLET BY MOUTH ONCE DAILY BEFORE BREAKFAST  . loperamide (IMODIUM) 2 MG capsule Take 1 capsule (2 mg total) by mouth 4 (four) times daily as needed for diarrhea or loose stools.  [name]  losartan (COZAAR) 50 MG tablet TAKE 1 TABLET BY MOUTH EVERY DAY  . metFORMIN (GLUCOPHAGE) 1000 MG tablet TAKE 1 TABLET (1,000 MG TOTAL) BY MOUTH 2 (TWO) TIMES DAILY WITH A MEAL.  [name] omeprazole (PRILOSEC) 40 MG capsule TAKE 1 CAPSULE  BY MOUTH EVERY DAY (Patient not taking: Reported on 10/23/2019)  . ondansetron (ZOFRAN-ODT) 4 MG disintegrating tablet Take 1 tablet (4 mg total) by mouth every 8 (eight) hours as needed for nausea or vomiting.  [name] oxybutynin (DITROPAN XL) 15 MG 24 hr tablet TAKE 1 TABLET BY MOUTH EVERYDAY AT BEDTIME  . sertraline (ZOLOFT) 100 MG tablet TAKE 1 TABLET BY MOUTH EVERY DAY  . simvastatin (ZOCOR) 20 MG tablet TAKE 1 TABLET BY MOUTH EVERYDAY AT BEDTIME  . topiramate (TOPAMAX) 200 MG tablet Take 1 tablet (200 mg total) by mouth 2 (two) times daily. Note change in dose.. pt know that this is a 29m pill not the 1021mshe is use to taken,   No facility-administered encounter medications on file as of 12/19/2019.     Current Diagnosis/Assessment:    Goals Addressed   None     Hypertension   BP goal is:  <130/80  Office blood pressures are  BP Readings from Last 3 Encounters:  08/22/19 128/84  08/15/19 (!) 157/69  06/01/19 128/74   Lab Results  Component Value Date   CREATININE 0.56 08/22/2019   BUN 5 (L) 08/22/2019   GFR 112.11 06/01/2019   GFRNONAA 96 08/22/2019   GFRAA 111 08/22/2019   NA 139 08/22/2019   K 3.6 08/22/2019   CALCIUM 8.8 08/22/2019   CO2 26 08/22/2019   Patient checks BP at home infrequently - PT checks it when they come Patient home BP readings are ranging: 160/100 on 8/20  Patient has failed these meds in the past: diltiazem Patient is currently controlled on the following medications:  . Losartan 50 mg daily . Furosemide 20 mg daily  We discussed: BP goals; benefits of medication; stress/pain can increase BP temporarily; pt recently had some high readings when PT has checked, but she has been in more pain than usual and still suffering from daily loose stools. She does report swelling is completely gone since taking furosemide daily.   Plan  Continue current medications  Recommend to monitor BMP shortly given loose stools and new furosemide.     Hyperlipidemia   LDL goal < 70 Hx MI 2007, tx medically  Lipid Panel     Component Value Date/Time   CHOL 158 06/01/2019 1152   TRIG 130.0 06/01/2019 1152   HDL 50.80 06/01/2019 1152   LDLCALC 81 06/01/2019 1152    Hepatic Function Latest Ref Rng & Units 08/12/2019 08/11/2019 06/01/2019  Total Protein 6.5 - 8.1 g/dL 5.7(L) 6.9 6.5  Albumin 3.5 - 5.0 g/dL 3.0(L) 3.6 3.7  AST 15 - 41 U/L 9(L) 11(L) 6  ALT 0 - 44 U/L '8 9 4  ' Alk Phosphatase 38 - 126 U/L 37(L) 49 56  Total Bilirubin 0.3 - 1.2 mg/dL 0.3 0.4 0.2  Bilirubin, Direct 0.0 - 0.3 mg/dL - - -    The ASCVD Risk score (GMikey BussingC Jr., et al., 2013) failed to calculate for the following reasons:   The patient has a prior MI or stroke diagnosis   Patient has failed these meds in past: n/a Patient is currently uncontrolled on the following medications:  . Simvastatin 20 mg HS  We discussed:  Cholesterol goals; benefit of statin for ASCVD risk reduction;  with history of MI a high intensity statin is recommended for secondary prevention, pt is dealing with acute issues today so will address at follow up.   Plan  Continue current medications  Consider switch to high intensity statin when acute issues stabilize  Diabetes   A1c goal <6.5%  Recent Relevant Labs: Lab Results  Component Value Date/Time   HGBA1C 5.8 (H) 08/12/2019 12:12 AM   HGBA1C 5.9 06/01/2019 11:52 AM   GFR 112.11 06/01/2019 11:52 AM   GFR 97.52 12/22/2018 01:15 PM   MICROALBUR 4.2 (H) 11/25/2015 09:39 AM   MICROALBUR 2.0 (H) 10/01/2014 12:11 PM    Last diabetic Eye exam:  Lab Results  Component Value Date/Time   HMDIABEYEEXA No Retinopathy 03/28/2017 12:00 AM   HMDIABEYEEXA No Retinopathy 03/28/2017 12:00 AM    Last diabetic Foot exam: No results found for: HMDIABFOOTEX   Checking BG: Daily  Recent FBG Readings: 120 after eating  Patient has failed these meds in past: n/a Patient is currently controlled on the following medications: [name] Metformin  1000 mg BID . Glipizide 10 mg BID  We discussed: A1c goal; concern for over-controlling DM; pt has had low blood sugars before and typically feels shaky, sweaty, and dizzy. She has been feeling like this lately but more likely related to loose stools and decreased PO intake. Discussed metformin-related diarrhea and importance of taking with food. Pt can hardly keep any food down, only tolerating jello and water per patient. It is reasonable to hold metformin until GI issues resolve.  Plan  Continue glipizide 10 mg BID Recommend to hold metformin until diarrhea resolves Recommend to check fasting BG and monitor for low BG  Depression   Depression screen [facility] 2/9 11/27/2019 11/22/2018 11/16/2017  Decreased Interest 0 0 1  Down, Depressed, Hopeless '1 1 2  ' PHQ - 2 Score '1 1 3  ' Altered sleeping - 3 3  Tired, decreased energy - 2 2  Change in appetite - 1 0  Feeling bad or failure about yourself  - 0 1  Trouble concentrating - 0 0  Moving slowly or fidgety/restless - 0 0  Suicidal thoughts - 0 0  PHQ-9 Score - 7 9  Difficult doing work/chores - Not difficult at all Not difficult at all  Some recent data might be hidden   Patient has failed these meds in past: n/a Patient is currently controlled on the following medications:  . Sertraline 100 mg - 1.5 tablets daily . Trazodone 50 mg - 2 tab HS  We discussed:  Pt reports doing well with sertraline. She is not sleeping well despite trazodone nightly; she does sleep in a recliner due to arthritic hip pain; pt reports pain is the main issue keeping her awake.  Plan  Continue current medications  Hypothyroidism   Lab Results  Component Value Date/Time   TSH 1.841 08/12/2019 03:55 AM   TSH 4.17 06/01/2019 11:52 AM   TSH 2.83 12/22/2018 01:15 PM   FREET4 0.5 (L) 01/28/2006 01:34 PM   Patient has failed these meds in past: n/a Patient is currently controlled on the following medications:  . Levothyroxine 75 mcg daily  We discussed:   TSH is at goal; pt denies issues  Plan  Continue current medications  GERD   Patient has failed these meds in past: n/a Patient is currently controlled on the following medications:  . Omeprazole 40 mg daily  We discussed:  Pt denies significant issues with heartburn / reflux; she reports she has been  on omeprazole "forever". Discussed long term complications associated with PPI use.  Plan  Recommend trial off PPI to determine necessity  Epilepsy   Patient has failed these meds in past: n/a Patient is currently controlled on the following medications:  . Divalproex 500 mg BID . Topiramate 200 mg BID  We discussed:  Pt reports last seizure around 2002, neurologist may reduce divalproex dose in the near future since she recently increased topiramate  Plan  Continue current medications  Migraine   Patient has failed these meds in past: n/a Patient is currently controlled on the following medications:  . Topiramate 200 mg BID  We discussed:  Pt reports topiramate is helping with migraines, denies issues  Plan  Continue current medications  IBS   Patient has failed these meds in past: n/a Patient is currently uncontrolled on the following medications:  . Loperamide 2 mg PRN  We discussed:  Pt takes 2 mg of loperamide daily, afraid to take more. Discussed maximum daily dose of loperamide is 16 mg. Discussed side effects of loperamide including constipation.  Plan  Recommend 4 mg of loperamide with first loose stool, may repeat 2 mg for each subsequent loose still (max 16 mg/24 hrs)  Overactive Bladder   Patient has failed these meds in past: n/a Patient is currently controlled on the following medications:  . Oxybutynin XL 15 mg HS  We discussed: Patient is satisfied with current regimen and denies issues  Plan  Continue current medications  Pain   Osteoarthritis Fibromyalgia Neuropathy  Patient has failed these meds in past: gabapentin  (swelling) Patient is currently uncontrolled on the following medications:  . Tylenol w/codeine 300-6m q6h PRN --out of med . Tylenol 500 mg PRN  We discussed:  Pt reports significant arthritic hip pain that is interfering with sleep and daily activities. It has been better controlled with Tylenol #3 in the past, but this med was dc'd at discharge last month and has not yet been renewed. Pt requests refill from PCP.  Checked PMP - Tylenol #3 last filled 07/24/19 for #120 tablets.  Plan  Request refill for Tylenol w/ codeine  #120 per month  Medication Management   [facility] medications Uses pill box? No - prefers bottles Pt endorses 100% compliance  We discussed: Pt reports all medications are covered. [facility] is [facility] with her insurance. Pt is satisfied with pharmacy services.  Plan  Continue current medication management strategy    Follow up: *** month phone visit  LCharlene Brooke PharmD, BCACP Clinical Pharmacist [phone]

## 2019-12-22 ENCOUNTER — Other Ambulatory Visit: Payer: Self-pay | Admitting: Internal Medicine

## 2020-01-14 ENCOUNTER — Telehealth: Payer: Self-pay | Admitting: Pharmacist

## 2020-01-14 NOTE — Progress Notes (Addendum)
Chronic Care Management Pharmacy Assistant   Name: Alexis Grant  MRN: [id number] DOB: 06-26-1950  Reason for Encounter: Diabetic [place] Adherence Call  PCP : Binnie Rail, MD  Allergies:   Allergies  Allergen Reactions   Hydrocodone-Acetaminophen Itching    Itches all over; without rash per pt    Medications: Outpatient Encounter Medications as of 01/14/2020  Medication Sig   Acetaminophen-Codeine 300-30 MG tablet TAKE 2 TABLETS BY MOUTH 2 (TWO) TIMES DAILY AS NEEDED FOR MODERATE PAIN. FOR CHRONIC HIP PAIN   traZODone (DESYREL) 50 MG tablet TAKE 2 TABLETS BY MOUTH AT BEDTIME FOR SLEEP   acetaminophen (TYLENOL) 500 MG tablet Take 1 tablet (500 mg total) by mouth every 6 (six) hours as needed.   blood glucose meter kit and supplies KIT Dispense based on patient and insurance preference. Use up to four times daily as directed. (FOR E11.9).   divalproex (DEPAKOTE) 500 MG DR tablet Take 1 tab twice a day   fluorometholone (FML) 0.1 % ophthalmic suspension SMARTSIG:In Eye(s)   furosemide (LASIX) 20 MG tablet Take 1 tablet (20 mg total) by mouth daily.   glipiZIDE (GLUCOTROL) 10 MG tablet TAKE 1 TABLET (10 MG TOTAL) BY MOUTH 2 (TWO) TIMES DAILY BEFORE A MEAL.   levothyroxine (SYNTHROID) 75 MCG tablet TAKE 1 TABLET BY MOUTH ONCE DAILY BEFORE BREAKFAST   loperamide (IMODIUM) 2 MG capsule Take 1 capsule (2 mg total) by mouth 4 (four) times daily as needed for diarrhea or loose stools.   losartan (COZAAR) 50 MG tablet TAKE 1 TABLET BY MOUTH EVERY DAY   metFORMIN (GLUCOPHAGE) 1000 MG tablet TAKE 1 TABLET (1,000 MG TOTAL) BY MOUTH 2 (TWO) TIMES DAILY WITH A MEAL.   omeprazole (PRILOSEC) 40 MG capsule TAKE 1 CAPSULE BY MOUTH EVERY DAY (Patient not taking: Reported on 10/23/2019)   ondansetron (ZOFRAN-ODT) 4 MG disintegrating tablet Take 1 tablet (4 mg total) by mouth every 8 (eight) hours as needed for nausea or vomiting.   oxybutynin (DITROPAN XL) 15 MG 24 hr tablet TAKE 1 TABLET  BY MOUTH EVERYDAY AT BEDTIME   sertraline (ZOLOFT) 100 MG tablet TAKE 1 TABLET BY MOUTH EVERY DAY   simvastatin (ZOCOR) 20 MG tablet TAKE 1 TABLET BY MOUTH EVERYDAY AT BEDTIME   topiramate (TOPAMAX) 200 MG tablet Take 1 tablet (200 mg total) by mouth 2 (two) times daily. Note change in dose.. pt know that this is a 272m pill not the 1026mshe is use to taken,   No facility-administered encounter medications on file as of 01/14/2020.    Current Diagnosis: Patient Active Problem List   Diagnosis Date Noted   Bilateral leg edema 06/01/2019   Overactive bladder 11/23/2018   Frequent UTI 05/03/2018   Lump of skin 12/30/2017   Watery eyes 12/30/2017   Polyneuropathy 09/27/2017   Osteoarthritis, hip, bilateral 05/25/2017   Osteopenia 11/22/2016   GERD (gastroesophageal reflux disease) 11/15/2016   Difficulty sleeping 05/24/2016   Rosacea 11/25/2015   Poor balance 11/25/2015   Tremor 04/18/2015   Other fatigue 02/17/2015   Migraine with aura and without status migrainosus, not intractable 12/04/2014   Localization-related (focal) (partial) idiopathic epilepsy and epileptic syndromes with seizures of localized onset, not intractable, without status epilepticus ([place]11/16/2016   Hx of adenomatous colonic polyps 07/16/2014   Benign neoplasm of ascending colon 07/16/2014   Benign neoplasm of descending colon 07/16/2014   Benign neoplasm of cecum 07/16/2014   Benign neoplasm of transverse colon 07/16/2014  Fibromyalgia 05/30/2013   Generalized convulsive epilepsy ([place]) 10/20/2012   Seasonal allergies 12/08/2011   DM (diabetes mellitus), type 2 with peripheral vascular complications ([place]) 38/10/1749   Essential hypertension 01/05/2010   Irritable bowel syndrome 01/05/2010   VERTIGO 09/16/2009   Diarrhea 09/16/2009   VITAMIN D DEFICIENCY 02/24/2009   MYOCARDIAL INFARCTION, ACUTE, SUBENDOCARDIAL 10/04/2008   Hypothyroidism 03/19/2008   COLONIC POLYPS, ADENOMATOUS, HX OF 08/07/2007    HEMANGIOMA 04/26/2007   Dyslipidemia 04/26/2007   OBESITY 04/26/2007   Depression 04/26/2007   SLEEP APNEA, OBSTRUCTIVE 04/26/2007   HIATAL HERNIA 04/26/2007   FATTY LIVER DISEASE 04/26/2007    Goals Addressed   None     Follow-Up:  Pharmacist Review  Recent Relevant Labs: Lab Results  Component Value Date/Time   HGBA1C 5.8 (H) 08/12/2019 12:12 AM   HGBA1C 5.9 06/01/2019 11:52 AM   MICROALBUR 4.2 (H) 11/25/2015 09:39 AM   MICROALBUR 2.0 (H) 10/01/2014 12:11 PM    Kidney Function Lab Results  Component Value Date/Time   CREATININE 0.56 08/22/2019 02:12 PM   CREATININE 0.45 08/14/2019 04:07 AM   CREATININE 0.50 08/13/2019 06:00 AM   CREATININE 0.64 04/09/2011 09:21 AM   GFR 112.11 06/01/2019 11:52 AM   GFRNONAA 96 08/22/2019 02:12 PM   GFRAA 111 08/22/2019 02:12 PM    Current antihyperglycemic regimen: The patient regime is Metformin 1000 mg and Glipizide 10 mg  What recent interventions/DTPs have been made to improve glycemic control: The patient is to continue to take medications as directed  Have there been any recent hospitalizations or ED visits since last visit with CPP? The patient has not been to the hospital or the ED since last visit with CPP  Patient denies hypoglycemic symptoms Patient denies hyperglycemic symptoms  How often are you checking your blood sugar? The patient states that she checks her blood sugar at least twice a day  What are your blood sugars ranging?  Fasting: The patient states that her blood sugars are ranging between 80-100 when she wakes up Before meals:  After meals: The patient states that around mid day her blood sugars are around 120 Bedtime:   During the week, how often does your blood glucose drop below 70? The patient states that her blood sugars are not under 70  Are you checking your feet daily/regularly? The patient states that she does not have any sore, blisters, cuts, swelling or infection she checks her feet  daily  Adherence Review: Is the patient currently on a STATIN medication? Yes, Simvastatin Is the patient currently on ACE/ARB medication? Yes, Losartan Does the patient have >5 day gap between last estimated fill dates? No   Wendy Poet, Clinical Pharmacist Assistant [facility]   Addendum 01/17/20: Patient fasting blood sugars on lower end of range and A1c is low given the patient's age, but patient does not report symptoms of hypogylcemia. Would still be concerned about the risk of potential nocturnal or asymptomatic hypoglycemia. Will continue medications for now, but will inform Ventura Sellers, CPP so she is aware.   13 minutes spent in review, coordination, and documentation. Doristine Section Clinical Pharmacist [facility] [facility]  [phone]

## 2020-01-25 ENCOUNTER — Telehealth: Payer: Self-pay | Admitting: Internal Medicine

## 2020-01-25 MED ORDER — ACETAMINOPHEN-CODEINE 300-30 MG PO TABS: ORAL_TABLET | ORAL | 0 refills | Status: DC

## 2020-01-25 NOTE — Telephone Encounter (Signed)
Acetaminophen-Codeine 300-30 MG tablet [facility] [place], [address] Phone:  [phone]  Fax:  [phone]     Patient calling requesting a refill, also [facility] Last seen-08.04.21 Next apt- 01.13.22

## 2020-01-31 ENCOUNTER — Ambulatory Visit: Payer: Medicare Other | Admitting: Internal Medicine

## 2020-02-01 ENCOUNTER — Ambulatory Visit: Payer: Medicare Other | Admitting: Internal Medicine

## 2020-02-04 ENCOUNTER — Telehealth: Payer: Self-pay | Admitting: Pharmacist

## 2020-02-04 NOTE — Progress Notes (Signed)
Chronic Care Management Pharmacy Assistant   Name: Alexis Grant  MRN: [id number] DOB: January 17, 1951  Reason for Encounter: General Adherence Call   PCP : Binnie Rail, MD  Allergies:   Allergies  Allergen Reactions  . Hydrocodone-Acetaminophen Itching    Itches all over; without rash per pt    Medications: Outpatient Encounter Medications as of 02/04/2020  Medication Sig  . traZODone (DESYREL) 50 MG tablet TAKE 2 TABLETS BY MOUTH AT BEDTIME FOR SLEEP  . acetaminophen (TYLENOL) 500 MG tablet Take 1 tablet (500 mg total) by mouth every 6 (six) hours as needed.  . Acetaminophen-Codeine 300-30 MG tablet TAKE 2 TABLETS BY MOUTH 2 (TWO) TIMES DAILY AS NEEDED FOR MODERATE PAIN. FOR CHRONIC HIP PAIN  . blood glucose meter kit and supplies KIT Dispense based on patient and insurance preference. Use up to four times daily as directed. (FOR E11.9).  [name] divalproex (DEPAKOTE) 500 MG DR tablet Take 1 tab twice a day  . fluorometholone (FML) 0.1 % ophthalmic suspension SMARTSIG:In Eye(s)  . furosemide (LASIX) 20 MG tablet Take 1 tablet (20 mg total) by mouth daily.  [name] glipiZIDE (GLUCOTROL) 10 MG tablet TAKE 1 TABLET (10 MG TOTAL) BY MOUTH 2 (TWO) TIMES DAILY BEFORE A MEAL.  [name] levothyroxine (SYNTHROID) 75 MCG tablet TAKE 1 TABLET BY MOUTH ONCE DAILY BEFORE BREAKFAST  . loperamide (IMODIUM) 2 MG capsule Take 1 capsule (2 mg total) by mouth 4 (four) times daily as needed for diarrhea or loose stools.  [name] losartan (COZAAR) 50 MG tablet TAKE 1 TABLET BY MOUTH EVERY DAY  . metFORMIN (GLUCOPHAGE) 1000 MG tablet TAKE 1 TABLET (1,000 MG TOTAL) BY MOUTH 2 (TWO) TIMES DAILY WITH A MEAL.  [name] omeprazole (PRILOSEC) 40 MG capsule TAKE 1 CAPSULE BY MOUTH EVERY DAY (Patient not taking: Reported on 10/23/2019)  . ondansetron (ZOFRAN-ODT) 4 MG disintegrating tablet Take 1 tablet (4 mg total) by mouth every 8 (eight) hours as needed for nausea or vomiting.  [name] oxybutynin (DITROPAN XL) 15 MG 24 hr tablet TAKE 1 TABLET  BY MOUTH EVERYDAY AT BEDTIME  . sertraline (ZOLOFT) 100 MG tablet TAKE 1 TABLET BY MOUTH EVERY DAY  . simvastatin (ZOCOR) 20 MG tablet TAKE 1 TABLET BY MOUTH EVERYDAY AT BEDTIME  . topiramate (TOPAMAX) 200 MG tablet Take 1 tablet (200 mg total) by mouth 2 (two) times daily. Note change in dose.. pt know that this is a 259m pill not the 1090mshe is use to taken,   No facility-administered encounter medications on file as of 02/04/2020.    Current Diagnosis: Patient Active Problem List   Diagnosis Date Noted  . Bilateral leg edema 06/01/2019  . Overactive bladder 11/23/2018  . Frequent UTI 05/03/2018  . Lump of skin 12/30/2017  . Watery eyes 12/30/2017  . Polyneuropathy 09/27/2017  . Osteoarthritis, hip, bilateral 05/25/2017  . Osteopenia 11/22/2016  . GERD (gastroesophageal reflux disease) 11/15/2016  . Difficulty sleeping 05/24/2016  . Rosacea 11/25/2015  . Poor balance 11/25/2015  . Tremor 04/18/2015  . Other fatigue 02/17/2015  . Migraine with aura and without status migrainosus, not intractable 12/04/2014  . 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 Lopez11/16/2016  . Hx of adenomatous colonic polyps 07/16/2014  . Benign neoplasm of ascending colon 07/16/2014  . Benign neoplasm of descending colon 07/16/2014  . Benign neoplasm of cecum 07/16/2014  . Benign neoplasm of transverse colon 07/16/2014  . Fibromyalgia  05/30/2013  . Generalized convulsive epilepsy ([place]) 10/20/2012  . Seasonal allergies 12/08/2011  . DM (diabetes mellitus), type 2 with peripheral vascular complications ([place]) 71/24/5809  . Essential hypertension 01/05/2010  . Irritable bowel syndrome 01/05/2010  . VERTIGO 09/16/2009  . Diarrhea 09/16/2009  . VITAMIN D DEFICIENCY 02/24/2009  . MYOCARDIAL INFARCTION, ACUTE, SUBENDOCARDIAL 10/04/2008  . Hypothyroidism 03/19/2008  . COLONIC POLYPS,  ADENOMATOUS, HX OF 08/07/2007  . HEMANGIOMA 04/26/2007  . Dyslipidemia 04/26/2007  . OBESITY 04/26/2007  . Depression 04/26/2007  . SLEEP APNEA, OBSTRUCTIVE 04/26/2007  . HIATAL HERNIA 04/26/2007  . FATTY LIVER DISEASE 04/26/2007    Goals Addressed   None     Follow-Up:  Pharmacist Review    Reviewed the patients chart she had an Diabetic disease state call on 01/14/2020. The patient's last appointment with Clinical Pharmacist Mendel Ryder was on 09/11/2019 dur the visit it was discussed about the pts GI issues with abdomen pain and diarrhea.   Called to follow up with the patient to check on how her GI issues have been doing. She stated that her diarrhea has stopped and she no longer has abdomen pain since stopping her metformin. She is now on glipizide 7m one tablet twice daily. The patient stated that her blood sugar has been running between  80-100 she checks her sugar once daily after breakfast around mid-morning.   DRosendo Gros [facility] Practice Team Manager/ CPA (Clinical Pharmacist Assistant) [phone]

## 2020-02-05 ENCOUNTER — Encounter: Payer: Self-pay | Admitting: Internal Medicine

## 2020-02-05 ENCOUNTER — Telehealth (INDEPENDENT_AMBULATORY_CARE_PROVIDER_SITE_OTHER): Payer: Medicare Other | Admitting: Internal Medicine

## 2020-02-05 DIAGNOSIS — E1151 Type 2 diabetes mellitus with diabetic peripheral angiopathy without gangrene: Secondary | ICD-10-CM | POA: Diagnosis not present

## 2020-02-05 DIAGNOSIS — E559 Vitamin D deficiency, unspecified: Secondary | ICD-10-CM

## 2020-02-05 DIAGNOSIS — U071 COVID-19: Secondary | ICD-10-CM

## 2020-02-05 MED ORDER — DEXAMETHASONE 6 MG PO TABS
6.0000 mg | ORAL_TABLET | Freq: Every day | ORAL | 0 refills | Status: AC
Start: 2020-02-05 — End: 2020-02-15

## 2020-02-05 MED ORDER — ALBUTEROL SULFATE HFA 108 (90 BASE) MCG/ACT IN AERS
2.0000 | INHALATION_SPRAY | Freq: Four times a day (QID) | RESPIRATORY_TRACT | 5 refills | Status: AC | PRN
Start: 2020-02-05 — End: ?

## 2020-02-05 NOTE — Assessment & Plan Note (Signed)
Pt not eligible for monoclonal ab under current guidelines, will refer for iv remdesivr, decadron 6 mg daily x 10 days, delcines cough med, has zofran prn at home already, for albuterol hfa prn, and otc suppleents - vit d and c, and zinc,  to f/u any worsening symptoms or concerns

## 2020-02-05 NOTE — Patient Instructions (Signed)
Please take all new medication as prescribed - decadron and albuterol HFA as needed  You are referred to the [facility] for possible IV remdesivir  Please also take Vit C, Vit D, and zinc supplements

## 2020-02-05 NOTE — Assessment & Plan Note (Signed)
Lab Results  Component Value Date   HGBA1C 5.8 (H) 08/12/2019   Stable, pt to continue current medical treatment glucotrol and metformin  Current Outpatient Medications (Endocrine & Metabolic):  .  dexamethasone (DECADRON) 6 MG tablet, Take 1 tablet (6 mg total) by mouth daily for 10 days. [name]  glipiZIDE (GLUCOTROL) 10 MG tablet, TAKE 1 TABLET (10 MG TOTAL) BY MOUTH 2 (TWO) TIMES DAILY BEFORE A MEAL. [name]  levothyroxine (SYNTHROID) 75 MCG tablet, TAKE 1 TABLET BY MOUTH ONCE DAILY BEFORE BREAKFAST .  metFORMIN (GLUCOPHAGE) 1000 MG tablet, TAKE 1 TABLET (1,000 MG TOTAL) BY MOUTH 2 (TWO) TIMES DAILY WITH A MEAL.  Current Outpatient Medications (Cardiovascular):  .  furosemide (LASIX) 20 MG tablet, Take 1 tablet (20 mg total) by mouth daily. [name]  losartan (COZAAR) 50 MG tablet, TAKE 1 TABLET BY MOUTH EVERY DAY .  simvastatin (ZOCOR) 20 MG tablet, TAKE 1 TABLET BY MOUTH EVERYDAY AT BEDTIME  Current Outpatient Medications (Respiratory):  .  albuterol (VENTOLIN HFA) 108 (90 Base) MCG/ACT inhaler, Inhale 2 puffs into the lungs every 6 (six) hours as needed for wheezing or shortness of breath.  Current Outpatient Medications (Analgesics):  .  acetaminophen (TYLENOL) 500 MG tablet, Take 1 tablet (500 mg total) by mouth every 6 (six) hours as needed. .  Acetaminophen-Codeine 300-30 MG tablet, TAKE 2 TABLETS BY MOUTH 2 (TWO) TIMES DAILY AS NEEDED FOR MODERATE PAIN. FOR CHRONIC HIP PAIN   Current Outpatient Medications (Other):  .  traZODone (DESYREL) 50 MG tablet, TAKE 2 TABLETS BY MOUTH AT BEDTIME FOR SLEEP .  blood glucose meter kit and supplies KIT, Dispense based on patient and insurance preference. Use up to four times daily as directed. (FOR E11.9). [name]  divalproex (DEPAKOTE) 500 MG DR tablet, Take 1 tab twice a day .  fluorometholone (FML) 0.1 % ophthalmic suspension, SMARTSIG:In Eye(s) .  loperamide (IMODIUM) 2 MG capsule, Take 1 capsule (2 mg total) by mouth 4 (four) times daily as needed for  diarrhea or loose stools. [name]  omeprazole (PRILOSEC) 40 MG capsule, TAKE 1 CAPSULE BY MOUTH EVERY DAY (Patient not taking: Reported on 10/23/2019) .  ondansetron (ZOFRAN-ODT) 4 MG disintegrating tablet, Take 1 tablet (4 mg total) by mouth every 8 (eight) hours as needed for nausea or vomiting. [name]  oxybutynin (DITROPAN XL) 15 MG 24 hr tablet, TAKE 1 TABLET BY MOUTH EVERYDAY AT BEDTIME .  sertraline (ZOLOFT) 100 MG tablet, TAKE 1 TABLET BY MOUTH EVERY DAY .  topiramate (TOPAMAX) 200 MG tablet, Take 1 tablet (200 mg total) by mouth 2 (two) times daily. Note change in dose.. pt know that this is a 241m pill not the 1061mshe is use to taken,

## 2020-02-05 NOTE — Progress Notes (Signed)
Patient ID: Alexis Grant, female   DOB: September 02, 1950, 70 y.o.   MRN: [id number]  Virtual Visit via Video Note  I connected with Alexis Grant on 02/05/20 at  2:40 PM EST by a video enabled telemedicine application and verified that I am speaking with the correct person using two identifiers.  Location of all participants today Patient: at home Provider: at office   I discussed the limitations of evaluation and management by telemedicine and the availability of in person appointments. The patient expressed understanding and agreed to proceed.  History of Present Illness: Here to f/u after grandson covid + visited her; pt symtpoms began jan 15 with sinus contestion, mild scan prod cough, chills, HA occasionally severe, has lost taste and smell, has minor nause and diarreha controlled with immodium prn, all associated with mild sob but no wheezing and Pt denies chest pain, orthopnea, PND, increased LE swelling, palpitations, dizziness or syncope.  No prior hx of covid infection.  Pt vaccinated and booster.  Has not yet gotten her own test yet but plans tomorrow.   Past Medical History:  Diagnosis Date  . Adenomatous polyp of colon 2008  . Allergy   . Anemia   . Anxiety   . Arthritis   . Blood transfusion without reported diagnosis    following knee surgery   . Cataract    hx- removed both eyes  . Depression   . Diabetes mellitus    type II   . Dyslipidemia   . Fibromyalgia   . Gallstones   . Gastritis   . GERD (gastroesophageal reflux disease)   . Hemangioma   . Hiatal hernia    Fibromyalgia  . Hyperlipidemia   . Hypertension   . Hypothyroidism 2010   Dr.[place]  . IBS (irritable bowel syndrome)    Dr Fuller Plan  . Migraines   . Myocardial infarct ([place]) 2010   subendocardrial, following R TKR  . Obesity   . OSA (obstructive sleep apnea)    cpap - does not know settings   . Pneumonia   . Seizure disorder ([place])   . Seizures ([place])    last seizure was 2002 per pt 07-29-16   . Sleep apnea   . Urinary tract infection    Past Surgical History:  Procedure Laterality Date  . ABDOMINAL HYSTERECTOMY  1988  . CARDIAC CATHETERIZATION  2007   DrGamble ( now seeing Dr Tonita Cong cardiology)  . CHOLECYSTECTOMY  1985  . COLONOSCOPY    . COLONOSCOPY WITH PROPOFOL N/A 07/16/2014   Procedure: COLONOSCOPY WITH PROPOFOL;  Surgeon: Ladene Artist, MD;  Location: [facility];  Service: Endoscopy;  Laterality: N/A;  . COLONOSCOPY WITH PROPOFOL N/A 09/21/2016   Procedure: COLONOSCOPY WITH PROPOFOL;  Surgeon: Ladene Artist, MD;  Location: [facility];  Service: Endoscopy;  Laterality: N/A;  . POLYPECTOMY  2012   6 or 7 adenomatous, Dr.Stark  . steroid injection to left si joint  12/2008   Dr.Newton   . TOTAL KNEE ARTHROPLASTY Right 11/2005  . TOTAL KNEE ARTHROPLASTY Left   . TUBAL LIGATION  1986    reports that she has never smoked. She has never used smokeless tobacco. She reports that she does not drink alcohol and does not use drugs. family history includes Colon polyps in her brother and mother; Diabetes in her paternal aunt and sister; Esophageal cancer in her brother; Kidney cancer in her sister; Kidney failure in her sister; Renal cancer in her sister; Ulcers in her father. Allergies  Allergen Reactions  . Hydrocodone-Acetaminophen Itching    Itches all over; without rash per pt   Current Outpatient Medications on File Prior to Visit  Medication Sig Dispense Refill  . traZODone (DESYREL) 50 MG tablet TAKE 2 TABLETS BY MOUTH AT BEDTIME FOR SLEEP 180 tablet 0  . acetaminophen (TYLENOL) 500 MG tablet Take 1 tablet (500 mg total) by mouth every 6 (six) hours as needed. 30 tablet 3  . Acetaminophen-Codeine 300-30 MG tablet TAKE 2 TABLETS BY MOUTH 2 (TWO) TIMES DAILY AS NEEDED FOR MODERATE PAIN. FOR CHRONIC HIP PAIN 120 tablet 0  . blood glucose meter kit and supplies KIT Dispense based on patient and insurance preference. Use up to four times daily as directed. (FOR  E11.9). 1 each 0  . divalproex (DEPAKOTE) 500 MG DR tablet Take 1 tab twice a day 180 tablet 3  . fluorometholone (FML) 0.1 % ophthalmic suspension SMARTSIG:In Eye(s)    . furosemide (LASIX) 20 MG tablet Take 1 tablet (20 mg total) by mouth daily. 90 tablet 1  . glipiZIDE (GLUCOTROL) 10 MG tablet TAKE 1 TABLET (10 MG TOTAL) BY MOUTH 2 (TWO) TIMES DAILY BEFORE A MEAL. 180 tablet 1  . levothyroxine (SYNTHROID) 75 MCG tablet TAKE 1 TABLET BY MOUTH ONCE DAILY BEFORE BREAKFAST 90 tablet 1  . loperamide (IMODIUM) 2 MG capsule Take 1 capsule (2 mg total) by mouth 4 (four) times daily as needed for diarrhea or loose stools. 12 capsule 0  . losartan (COZAAR) 50 MG tablet TAKE 1 TABLET BY MOUTH EVERY DAY 90 tablet 1  . metFORMIN (GLUCOPHAGE) 1000 MG tablet TAKE 1 TABLET (1,000 MG TOTAL) BY MOUTH 2 (TWO) TIMES DAILY WITH A MEAL. 180 tablet 3  . omeprazole (PRILOSEC) 40 MG capsule TAKE 1 CAPSULE BY MOUTH EVERY DAY (Patient not taking: Reported on 10/23/2019) 90 capsule 1  . ondansetron (ZOFRAN-ODT) 4 MG disintegrating tablet Take 1 tablet (4 mg total) by mouth every 8 (eight) hours as needed for nausea or vomiting. 20 tablet 0  . oxybutynin (DITROPAN XL) 15 MG 24 hr tablet TAKE 1 TABLET BY MOUTH EVERYDAY AT BEDTIME 90 tablet 1  . sertraline (ZOLOFT) 100 MG tablet TAKE 1 TABLET BY MOUTH EVERY DAY 90 tablet 1  . simvastatin (ZOCOR) 20 MG tablet TAKE 1 TABLET BY MOUTH EVERYDAY AT BEDTIME 90 tablet 1  . topiramate (TOPAMAX) 200 MG tablet Take 1 tablet (200 mg total) by mouth 2 (two) times daily. Note change in dose.. pt know that this is a 254m pill not the 1030mshe is use to taken, 180 tablet 1   No current facility-administered medications on file prior to visit.    Observations/Objective: Alert, NAD, appropriate mood and affect, resps normal, cn 2-12 intact, moves all 4s, no visible rash or swelling Lab Results  Component Value Date   WBC 6.1 08/22/2019   HGB 11.9 08/22/2019   HCT 37.2 08/22/2019   PLT  242 08/22/2019   GLUCOSE 109 (H) 08/22/2019   CHOL 158 06/01/2019   TRIG 130.0 06/01/2019   HDL 50.80 06/01/2019   LDLCALC 81 06/01/2019   ALT 8 08/12/2019   AST 9 (L) 08/12/2019   NA 139 08/22/2019   K 3.6 08/22/2019   CL 105 08/22/2019   CREATININE 0.56 08/22/2019   BUN 5 (L) 08/22/2019   CO2 26 08/22/2019   TSH 1.841 08/12/2019   INR 1.17 11/09/2008   HGBA1C 5.8 (H) 08/12/2019   MICROALBUR 4.2 (H) 11/25/2015   Assessment and Plan:  See notes  Follow Up Instructions: See notes   I discussed the assessment and treatment plan with the patient. The patient was provided an opportunity to ask questions and all were answered. The patient agreed with the plan and demonstrated an understanding of the instructions.   The patient was advised to call back or seek an in-person evaluation if the symptoms worsen or if the condition fails to improve as anticipated.   Cathlean Cower, MD

## 2020-02-05 NOTE — Assessment & Plan Note (Signed)
Last vitamin D Lab Results  Component Value Date   VD25OH 45 06/09/2009  to continue vit d supplement as above

## 2020-02-07 ENCOUNTER — Ambulatory Visit: Payer: Medicare Other | Admitting: Internal Medicine

## 2020-02-13 ENCOUNTER — Other Ambulatory Visit: Payer: Self-pay | Admitting: Internal Medicine

## 2020-02-13 ENCOUNTER — Telehealth: Payer: Medicare Other

## 2020-02-13 NOTE — Chronic Care Management (AMB) (Unsigned)
Chronic Care Management Pharmacy  Name: Alexis Grant  MRN: [id number] DOB: 05-27-1950   Chief Complaint/ HPI  Alexis Grant,  70 y.o. , female presents for their Initial CCM visit with the clinical pharmacist via telephone due to COVID-19 Pandemic.  PCP : Binnie Rail, MD Patient Care Team: Binnie Rail, MD as PCP - General (Internal Medicine) Cameron Sprang, MD as Consulting Physician (Neurology) Garald Balding, MD as Consulting Physician (Orthopedic Surgery) Chesley Mires, MD as Consulting Physician (Pulmonary Disease) Monna Fam, MD as Consulting Physician (Ophthalmology) Cameron Sprang, MD as Consulting Physician (Neurology) Charlton Haws, [facility] as Pharmacist (Pharmacist)  Their chronic conditions include: Hypertension, Hyperlipidemia, Diabetes, Coronary Artery Disease, GERD, Hypothyroidism, Depression, Osteopenia, Osteoarthritis, Overactive Bladder and Allergic Rhinitis, Epilepsy, Fatty liver disease, Migraine, Fibromyalgia  Pt has lived in [place] last 20 years. Born in [place], lived in [place] as well. Husband was in shopping center business ([employer]). Kids were raised in [place]. Husband passed away this year. 3 children - 2 sons, 1 daughter in [place]. Last job in [employer], retired from there.   Pt reports GI issues/diarrhea is not improving since hospitalization in July. She spends an hour in the bathroom every morning, takes loperamide daily, and cannot keep any food down except for Jello and water. She feels weak and shaky much of the time.  Office Visits: 08/22/19 Dr Quay Burow OV: hospital f/u. Swelling improved, continue to hold gabapentin, start lasix 20 mg daily.  06/01/19 Dr Quay Burow OV: increased sertraline to 150 mg  Consult Visit: 08/11/19 - 08/15/19 Hospital admission: N/V, diarrhea unclear cause, improved with IVF. B/l leg edema unclear cause, ECHO normal EF, oral lasix did not improve. Rec'd home PT. Hold gabapentin d/t leg  swelling.  04/06/19 NP Tammy Parrett (pulmonary): OSA f/u, no changes.  03/23/19 Dr [name] (neurology): f/u for neuropathy, migraine, epilepsy. Increased gabapentin to 600 mg HS. C/O tremors, depakote may be contributing, consider decreasing dose in future.   Allergies  Allergen Reactions  . Hydrocodone-Acetaminophen Itching    Itches all over; without rash per pt    Medications: Outpatient Encounter Medications as of 02/13/2020  Medication Sig  . traZODone (DESYREL) 50 MG tablet TAKE 2 TABLETS BY MOUTH AT BEDTIME FOR SLEEP  . acetaminophen (TYLENOL) 500 MG tablet Take 1 tablet (500 mg total) by mouth every 6 (six) hours as needed.  . Acetaminophen-Codeine 300-30 MG tablet TAKE 2 TABLETS BY MOUTH 2 (TWO) TIMES DAILY AS NEEDED FOR MODERATE PAIN. FOR CHRONIC HIP PAIN  . albuterol (VENTOLIN HFA) 108 (90 Base) MCG/ACT inhaler Inhale 2 puffs into the lungs every 6 (six) hours as needed for wheezing or shortness of breath.  . blood glucose meter kit and supplies KIT Dispense based on patient and insurance preference. Use up to four times daily as directed. (FOR E11.9).  [name] dexamethasone (DECADRON) 6 MG tablet Take 1 tablet (6 mg total) by mouth daily for 10 days.  . divalproex (DEPAKOTE) 500 MG DR tablet Take 1 tab twice a day  . fluorometholone (FML) 0.1 % ophthalmic suspension SMARTSIG:In Eye(s)  . furosemide (LASIX) 20 MG tablet Take 1 tablet (20 mg total) by mouth daily.  [name] glipiZIDE (GLUCOTROL) 10 MG tablet TAKE 1 TABLET (10 MG TOTAL) BY MOUTH 2 (TWO) TIMES DAILY BEFORE A MEAL.  [name] levothyroxine (SYNTHROID) 75 MCG tablet TAKE 1 TABLET BY MOUTH ONCE DAILY BEFORE BREAKFAST  . loperamide (IMODIUM) 2 MG capsule Take 1 capsule (2 mg total)  by mouth 4 (four) times daily as needed for diarrhea or loose stools.  . losartan (COZAAR) 50 MG tablet TAKE 1 TABLET BY MOUTH EVERY DAY  . metFORMIN (GLUCOPHAGE) 1000 MG tablet TAKE 1 TABLET (1,000 MG TOTAL) BY MOUTH 2 (TWO) TIMES DAILY WITH A MEAL.  . omeprazole  (PRILOSEC) 40 MG capsule TAKE 1 CAPSULE BY MOUTH EVERY DAY (Patient not taking: Reported on 10/23/2019)  . ondansetron (ZOFRAN-ODT) 4 MG disintegrating tablet Take 1 tablet (4 mg total) by mouth every 8 (eight) hours as needed for nausea or vomiting.  . oxybutynin (DITROPAN XL) 15 MG 24 hr tablet TAKE 1 TABLET BY MOUTH EVERYDAY AT BEDTIME  . sertraline (ZOLOFT) 100 MG tablet TAKE 1 TABLET BY MOUTH EVERY DAY  . simvastatin (ZOCOR) 20 MG tablet TAKE 1 TABLET BY MOUTH EVERYDAY AT BEDTIME  . topiramate (TOPAMAX) 200 MG tablet Take 1 tablet (200 mg total) by mouth 2 (two) times daily. Note change in dose.. pt know that this is a 200mg pill not the 100mg she is use to taken,   No facility-administered encounter medications on file as of 02/13/2020.     Current Diagnosis/Assessment:    Goals Addressed   None     Hypertension   BP goal is:  <130/80  Office blood pressures are  BP Readings from Last 3 Encounters:  08/22/19 128/84  08/15/19 (!) 157/69  06/01/19 128/74   Patient checks BP at home infrequently - PT checks it when they come Patient home BP readings are ranging: 160/100 on 8/20  Patient has failed these meds in the past: diltiazem Patient is currently controlled on the following medications:  . Losartan 50 mg daily . Furosemide 20 mg daily  We discussed: BP goals; benefits of medication; stress/pain can increase BP temporarily; pt recently had some high readings when PT has checked, but she has been in more pain than usual and still suffering from daily loose stools. She does report swelling is completely gone since taking furosemide daily.   Plan  Continue current medications  Recommend to monitor BMP shortly given loose stools and new furosemide.    Hyperlipidemia   LDL goal < 70 Hx MI 2007, tx medically  Lipid Panel     Component Value Date/Time   CHOL 158 06/01/2019 1152   TRIG 130.0 06/01/2019 1152   HDL 50.80 06/01/2019 1152   LDLCALC 81 06/01/2019 1152     Hepatic Function Latest Ref Rng & Units 08/12/2019 08/11/2019 06/01/2019  Total Protein 6.5 - 8.1 g/dL 5.7(L) 6.9 6.5  Albumin 3.5 - 5.0 g/dL 3.0(L) 3.6 3.7  AST 15 - 41 U/L 9(L) 11(L) 6  ALT 0 - 44 U/L 8 9 4  Alk Phosphatase 38 - 126 U/L 37(L) 49 56  Total Bilirubin 0.3 - 1.2 mg/dL 0.3 0.4 0.2  Bilirubin, Direct 0.0 - 0.3 mg/dL - - -    The ASCVD Risk score (Goff DC Jr., et al., 2013) failed to calculate for the following reasons:   The patient has a prior MI or stroke diagnosis   Patient has failed these meds in past: n/a Patient is currently uncontrolled on the following medications:  . Simvastatin 20 mg HS  We discussed:  Cholesterol goals; benefit of statin for ASCVD risk reduction; with history of MI a high intensity statin is recommended for secondary prevention, pt is dealing with acute issues today so will address at follow up.   Plan  Continue current medications  Consider switch to high intensity   statin when acute issues stabilize  Diabetes   A1c goal <6.5%  Recent Relevant Labs: Lab Results  Component Value Date/Time   HGBA1C 5.8 (H) 08/12/2019 12:12 AM   HGBA1C 5.9 06/01/2019 11:52 AM   GFR 112.11 06/01/2019 11:52 AM   GFR 97.52 12/22/2018 01:15 PM   MICROALBUR 4.2 (H) 11/25/2015 09:39 AM   MICROALBUR 2.0 (H) 10/01/2014 12:11 PM    Last diabetic Eye exam:  Lab Results  Component Value Date/Time   HMDIABEYEEXA No Retinopathy 11/14/2019 11:43 AM    Last diabetic Foot exam: No results found for: HMDIABFOOTEX   Checking BG: Daily  Recent FBG Readings: 120 after eating  Patient has failed these meds in past: n/a Patient is currently controlled on the following medications: [name] Metformin 1000 mg BID . Glipizide 10 mg BID  We discussed: A1c goal; concern for over-controlling DM; pt has had low blood sugars before and typically feels shaky, sweaty, and dizzy. She has been feeling like this lately but more likely related to loose stools and decreased PO  intake. Discussed metformin-related diarrhea and importance of taking with food. Pt can hardly keep any food down, only tolerating jello and water per patient. It is reasonable to hold metformin until GI issues resolve.  Plan  Continue glipizide 10 mg BID Recommend to hold metformin until diarrhea resolves Recommend to check fasting BG and monitor for low BG  Depression   Depression screen [facility] 2/9 11/27/2019 11/22/2018 11/16/2017  Decreased Interest 0 0 1  Down, Depressed, Hopeless _0 PHQ - 2 Score _1 Altered sleeping - 3 3  Tired, decreased energy - 2 2  Change in appetite - 1 0  Feeling bad or failure about yourself  - 0 1  Trouble concentrating - 0 0  Moving slowly or fidgety/restless - 0 0  Suicidal thoughts - 0 0  PHQ-9 Score - 7 9  Difficult doing work/chores - Not difficult at all Not difficult at all  Some recent data might be hidden   Patient has failed these meds in past: n/a Patient is currently controlled on the following medications:  . Sertraline 100 mg - 1.5 tablets daily . Trazodone 50 mg - 2 tab HS  We discussed:  Pt reports doing well with sertraline. She is not sleeping well despite trazodone nightly; she does sleep in a recliner due to arthritic hip pain; pt reports pain is the main issue keeping her awake.  Plan  Continue current medications  Hypothyroidism   Lab Results  Component Value Date/Time   TSH 1.841 08/12/2019 03:55 AM   TSH 4.17 06/01/2019 11:52 AM   TSH 2.83 12/22/2018 01:15 PM   FREET4 0.5 (L) 01/28/2006 01:34 PM   Patient has failed these meds in past: n/a Patient is currently controlled on the following medications:  . Levothyroxine 75 mcg daily  We discussed:  TSH is at goal; pt denies issues  Plan  Continue current medications  GERD   Patient has failed these meds in past: n/a Patient is currently controlled on the following medications:  . Omeprazole 40 mg daily  We discussed:  Pt denies significant issues with  heartburn / reflux; she reports she has been on omeprazole "forever". Discussed long term complications associated with PPI use.  Plan  Recommend trial off PPI to determine necessity  Epilepsy   Patient has failed these meds in past: n/a Patient is currently controlled on the following medications:  . Divalproex 500 mg BID .  Topiramate 100 mg BID  We discussed:  Pt reports last seizure around 2002, neurologist may reduce divalproex dose in the near future since she recently increased topiramate  Plan  Continue current medications  Migraine   Patient has failed these meds in past: n/a Patient is currently controlled on the following medications:  . Topiramate 100 mg BID  We discussed:  Pt reports topiramate is helping with migraines, denies issues  Plan  Continue current medications  IBS   Patient has failed these meds in past: n/a Patient is currently uncontrolled on the following medications:  . Loperamide 2 mg PRN  We discussed:  Pt takes 2 mg of loperamide daily, afraid to take more. Discussed maximum daily dose of loperamide is 16 mg. Discussed side effects of loperamide including constipation.  Plan  Recommend 4 mg of loperamide with first loose stool, may repeat 2 mg for each subsequent loose still (max 16 mg/24 hrs)  Overactive Bladder   Patient has failed these meds in past: n/a Patient is currently controlled on the following medications:  . Oxybutynin XL 15 mg HS  We discussed: Patient is satisfied with current regimen and denies issues  Plan  Continue current medications  Osteopenia    Last DEXA Scan: 11/17/2016  T-Score femoral neck: -1.2  T-Score total hip: n/a  T-Score lumbar spine: +0.5  T-Score forearm radius: n/a  10-year probability of major osteoporotic fracture: 6.7%  10-year probability of hip fracture: 0.5%  Vit D, 25-Hydroxy  Date Value Ref Range Status  06/09/2009 45 30 - 89 ng/mL Final    Comment:    See lab report for  associated comment(s)    Patient is not a candidate for pharmacologic treatment  Patient has failed these meds in past: n/a Patient is currently controlled on the following medications:  . No medications  We discussed:  Recommend 1200 mg of calcium daily from dietary and supplemental sources.  Plan  Continue control with diet and exercise  Pain   Osteoarthritis Fibromyalgia Neuropathy  Patient has failed these meds in past: gabapentin (swelling) Patient is currently uncontrolled on the following medications:  . Tylenol w/codeine 300-30mg q6h PRN --out of med . Tylenol 500 mg PRN  We discussed:  Pt reports significant arthritic hip pain that is interfering with sleep and daily activities. It has been better controlled with Tylenol #3 in the past, but this med was dc'd at discharge last month and has not yet been renewed. Pt requests refill from PCP.  Checked PMP - Tylenol #3 last filled 07/24/19 for #120 tablets.  Plan  Request refill for Tylenol w/ codeine  #120 per month  Medication Management   Pt uses [facility] for all medications Uses pill box? No - prefers bottles Pt endorses 100% compliance  We discussed: Pt reports all medications are covered. [facility] is [facility] preferred [facility] with her insurance. Pt is satisfied with pharmacy services.  Plan  Continue current medication management strategy    Follow up: *** month phone visit  Lindsey Foltanski, PharmD, BCACP Clinical Pharmacist [facility] [phone] 

## 2020-02-27 ENCOUNTER — Encounter: Payer: Self-pay | Admitting: Gastroenterology

## 2020-02-28 ENCOUNTER — Telehealth: Payer: Self-pay | Admitting: Internal Medicine

## 2020-03-04 NOTE — Telephone Encounter (Signed)
Pharmacy is unable to fill the order for medication without a PA.   Please advise.  Medication:acetaminophen-codeine (TYLENOL #3) 300-30 MG tablet Pharmacy: [facility], [place] (Ph: [phone])

## 2020-03-05 NOTE — Telephone Encounter (Signed)
Spoke with Alexis Grant in the pharmacy.  Patient can pick up medication tomorrow. She is 2 days early for refill.

## 2020-03-06 ENCOUNTER — Encounter: Payer: Self-pay | Admitting: Gastroenterology

## 2020-03-07 ENCOUNTER — Ambulatory Visit (INDEPENDENT_AMBULATORY_CARE_PROVIDER_SITE_OTHER): Payer: Medicare Other | Admitting: Pharmacist

## 2020-03-07 ENCOUNTER — Other Ambulatory Visit: Payer: Self-pay

## 2020-03-07 DIAGNOSIS — I252 Old myocardial infarction: Secondary | ICD-10-CM | POA: Diagnosis not present

## 2020-03-07 DIAGNOSIS — E1151 Type 2 diabetes mellitus with diabetic peripheral angiopathy without gangrene: Secondary | ICD-10-CM

## 2020-03-07 DIAGNOSIS — E785 Hyperlipidemia, unspecified: Secondary | ICD-10-CM | POA: Diagnosis not present

## 2020-03-07 DIAGNOSIS — K219 Gastro-esophageal reflux disease without esophagitis: Secondary | ICD-10-CM

## 2020-03-07 DIAGNOSIS — I10 Essential (primary) hypertension: Secondary | ICD-10-CM | POA: Diagnosis not present

## 2020-03-07 NOTE — Progress Notes (Signed)
Chronic Care Management Pharmacy Note  03/07/2020 Name:  Alexis Grant MRN:  [id number] DOB:  [birth year]  Subjective: Alexis Grant is an 70 y.o. year old female who is a primary patient of Burns, Claudina Lick, MD.  The CCM team was consulted for assistance with disease management and care coordination needs.    Engaged with patient by telephone for follow up visit in response to provider referral for pharmacy case management and/or care coordination services.   Consent to Services:  The patient was given the following information about Chronic Care Management services today, agreed to services, and gave verbal consent: 1. CCM service includes personalized support from designated clinical staff supervised by the primary care provider, including individualized plan of care and coordination with other care providers 2. 24/7 contact phone numbers for assistance for urgent and routine care needs. 3. Service will only be billed when office clinical staff spend 20 minutes or more in a month to coordinate care. 4. Only one practitioner may furnish and bill the service in a calendar month. 5.The patient may stop CCM services at any time (effective at the end of the month) by phone call to the office staff. 6. The patient will be responsible for cost sharing (co-pay) of up to 20% of the service fee (after annual deductible is met). Patient agreed to services and consent obtained.  Patient Care Team: Binnie Rail, MD as PCP - General (Internal Medicine) [name] Lezlie Octave, MD as Consulting Physician (Neurology) Garald Balding, MD as Consulting Physician (Orthopedic Surgery) Chesley Mires, MD as Consulting Physician (Pulmonary Disease) Monna Fam, MD as Consulting Physician (Ophthalmology) Cameron Sprang, MD as Consulting Physician (Neurology) Charlton Haws, [facility] as Pharmacist (Pharmacist)   Pt has lived in [place] last 20 years. Born in [place], lived in [place] as well. Husband was  in shopping center business ([employer]). Kids were raised in [place]. Husband passed away this year. 3 children - 2 sons, 1 daughter in [place]. Last job in [employer], retired from there.   Recent office visits: 02/05/20 Dr Jenny Reichmann VV: COVID symptoms w/ covid-positive grandson. Not eligible for monoclonal ab. Referred to covid clinic for possible remdesevir. rx'd decadron x 10 days, Vit C, D and zinc.  08/22/19 Dr Quay Burow OV: hospital f/u. Swelling improved, continue to hold gabapentin, start lasix 20 mg daily.  06/01/19 Dr Quay Burow OV: increased sertraline to 150 mg  Recent consult visits: None  Hospital visits: 08/11/19 - 08/15/19 Hospital admission: N/V, diarrhea unclear cause, improved with IVF. B/l leg edema unclear cause, ECHO normal EF, oral lasix did not improve. Rec'd home PT. Hold gabapentin d/t leg swelling.  Objective:  Lab Results  Component Value Date   CREATININE 0.56 08/22/2019   BUN 5 (L) 08/22/2019   GFR 112.11 06/01/2019   GFRNONAA 96 08/22/2019   GFRAA 111 08/22/2019   NA 139 08/22/2019   K 3.6 08/22/2019   CALCIUM 8.8 08/22/2019   CO2 26 08/22/2019    Lab Results  Component Value Date/Time   HGBA1C 5.8 (H) 08/12/2019 12:12 AM   HGBA1C 5.9 06/01/2019 11:52 AM   GFR 112.11 06/01/2019 11:52 AM   GFR 97.52 12/22/2018 01:15 PM   MICROALBUR 4.2 (H) 11/25/2015 09:39 AM   MICROALBUR 2.0 (H) 10/01/2014 12:11 PM    Last diabetic Eye exam:  Lab Results  Component Value Date/Time   HMDIABEYEEXA No Retinopathy 11/14/2019 11:43 AM    Last diabetic Foot exam: No results found for: HMDIABFOOTEX  Lab Results  Component Value Date   CHOL 158 06/01/2019   HDL 50.80 06/01/2019   LDLCALC 81 06/01/2019   TRIG 130.0 06/01/2019   CHOLHDL 3 06/01/2019    Hepatic Function Latest Ref Rng & Units 08/12/2019 08/11/2019 06/01/2019  Total Protein 6.5 - 8.1 g/dL 5.7(L) 6.9 6.5  Albumin 3.5 - 5.0 g/dL 3.0(L) 3.6 3.7  AST 15 - 41 U/L 9(L) 11(L) 6  ALT 0 - 44 U/L '8 9 4   ' Alk Phosphatase 38 - 126 U/L 37(L) 49 56  Total Bilirubin 0.3 - 1.2 mg/dL 0.3 0.4 0.2  Bilirubin, Direct 0.0 - 0.3 mg/dL - - -    Lab Results  Component Value Date/Time   TSH 1.841 08/12/2019 03:55 AM   TSH 4.17 06/01/2019 11:52 AM   TSH 2.83 12/22/2018 01:15 PM   FREET4 0.5 (L) 01/28/2006 01:34 PM    CBC Latest Ref Rng & Units 08/22/2019 08/14/2019 08/13/2019  WBC 3.8 - 10.8 Thousand/uL 6.1 6.5 8.5  Hemoglobin 11.7 - 15.5 g/dL 11.9 9.9(L) 10.7(L)  Hematocrit 35.0 - 45.0 % 37.2 33.2(L) 34.1(L)  Platelets 140 - 400 Thousand/uL 242 145(L) 161    Lab Results  Component Value Date/Time   VD25OH 45 06/09/2009 08:49 PM   VD25OH 23 (L) 02/21/2009 07:43 PM    Clinical ASCVD: Yes  The ASCVD Risk score Mikey Bussing DC Jr., et al., 2013) failed to calculate for the following reasons:   The patient has a prior MI or stroke diagnosis    Depression screen [facility] 2/9 11/27/2019 11/22/2018 11/16/2017  Decreased Interest 0 0 1  Down, Depressed, Hopeless '1 1 2  ' PHQ - 2 Score '1 1 3  ' Altered sleeping - 3 3  Tired, decreased energy - 2 2  Change in appetite - 1 0  Feeling bad or failure about yourself  - 0 1  Trouble concentrating - 0 0  Moving slowly or fidgety/restless - 0 0  Suicidal thoughts - 0 0  PHQ-9 Score - 7 9  Difficult doing work/chores - Not difficult at all Not difficult at all  Some recent data might be hidden      Social History   Tobacco Use  Smoking Status Never Smoker  Smokeless Tobacco Never Used   BP Readings from Last 3 Encounters:  08/22/19 128/84  08/15/19 (!) 157/69  06/01/19 128/74   Pulse Readings from Last 3 Encounters:  08/22/19 (!) 104  08/15/19 58  06/01/19 75   Wt Readings from Last 3 Encounters:  10/23/19 275 lb (124.7 kg)  08/22/19 (!) 303 lb (137.4 kg)  08/11/19 (!) 300 lb (136.1 kg)    Assessment/Interventions: Review of patient past medical history, allergies, medications, health status, including review of consultants reports, laboratory and  other test data, was performed as part of comprehensive evaluation and provision of chronic care management services.   SDOH:  (Social Determinants of Health) assessments and interventions performed: Yes   CCM Care Plan  Allergies  Allergen Reactions  . Hydrocodone-Acetaminophen Itching    Itches all over; without rash per pt    Medications Reviewed Today    Reviewed by Charlton Haws, [facility] (Pharmacist) on 03/07/20 at 1501  Med List Status: <None>  Medication Order Taking? Sig Documenting Provider Last Dose Status Informant  acetaminophen (TYLENOL) 500 MG tablet [id number] Yes Take 1 tablet (500 mg total) by mouth every 6 (six) hours as needed. Binnie Rail, MD Taking Active   acetaminophen-codeine (TYLENOL #3) 300-30 MG tablet [id number] Yes  TAKE 2 TABLETS BY MOUTH TWICE DAILY AS NEEDED FOR  MODERATE  PAIN. Marrian Salvage, FNP Taking Active   albuterol (VENTOLIN HFA) 108 (90 Base) MCG/ACT inhaler [id number] Yes Inhale 2 puffs into the lungs every 6 (six) hours as needed for wheezing or shortness of breath. Biagio Borg, MD Taking Active   blood glucose meter kit and supplies KIT [id number] Yes Dispense based on patient and insurance preference. Use up to four times daily as directed. (FOR E11.9). Binnie Rail, MD Taking Active   divalproex (DEPAKOTE) 500 MG DR tablet [id number] Yes Take 1 tab twice a day Cameron Sprang, MD Taking Active   fluorometholone (FML) 0.1 % ophthalmic suspension [id number] Yes SMARTSIG:In Eye(s) [provider] Taking Active   furosemide (LASIX) 20 MG tablet [id number] Yes TAKE 1 TABLET BY MOUTH EVERY DAY Burns, Claudina Lick, MD Taking Active   glipiZIDE (GLUCOTROL) 10 MG tablet [id number] Yes TAKE 1 TABLET (10 MG TOTAL) BY MOUTH 2 (TWO) TIMES DAILY BEFORE A MEAL. Binnie Rail, MD Taking Active   levothyroxine (SYNTHROID) 75 MCG tablet [id number] Yes TAKE 1 TABLET BY MOUTH ONCE DAILY BEFORE BREAKFAST Burns, Claudina Lick, MD Taking Active   loperamide  (IMODIUM) 2 MG capsule [id number] Yes Take 1 capsule (2 mg total) by mouth 4 (four) times daily as needed for diarrhea or loose stools. Hayden Rasmussen, MD Taking Active   losartan (COZAAR) 50 MG tablet [id number] Yes TAKE 1 TABLET BY MOUTH EVERY DAY Burns, Claudina Lick, MD Taking Active   ondansetron (ZOFRAN-ODT) 4 MG disintegrating tablet [id number] Yes Take 1 tablet (4 mg total) by mouth every 8 (eight) hours as needed for nausea or vomiting. Hayden Rasmussen, MD Taking Active   oxybutynin (DITROPAN XL) 15 MG 24 hr tablet [id number] Yes TAKE 1 TABLET BY MOUTH EVERYDAY AT BEDTIME Binnie Rail, MD Taking Active   sertraline (ZOLOFT) 100 MG tablet [id number] Yes TAKE 1 TABLET BY MOUTH EVERY DAY Burns, Claudina Lick, MD Taking Active   simvastatin (ZOCOR) 20 MG tablet [id number] Yes TAKE 1 TABLET BY MOUTH EVERYDAY AT BEDTIME Binnie Rail, MD Taking Active   topiramate (TOPAMAX) 200 MG tablet [id number] Yes Take 1 tablet (200 mg total) by mouth 2 (two) times daily. Note change in dose.. pt know that this is a 223m pill not the 1085mshe is use to taken, AqCameron SprangMD Taking Active   traZODone (DESYREL) 50 MG tablet [id number] TAKE 2 TABLETS BY MOUTH AT BEDTIME FOR SLEEP Burns, StClaudina LickMD Taking Active           Patient Active Problem List   Diagnosis Date Noted  . COVID-19 virus infection 02/05/2020  . Bilateral leg edema 06/01/2019  . Overactive bladder 11/23/2018  . Frequent UTI 05/03/2018  . Lump of skin 12/30/2017  . Watery eyes 12/30/2017  . Polyneuropathy 09/27/2017  . Osteoarthritis, hip, bilateral 05/25/2017  . Osteopenia 11/22/2016  . GERD (gastroesophageal reflux disease) 11/15/2016  . Difficulty sleeping 05/24/2016  . Rosacea 11/25/2015  . Poor balance 11/25/2015  . Tremor 04/18/2015  . Other fatigue 02/17/2015  . Migraine with aura and without status migrainosus, not intractable 12/04/2014  . 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 Hx of adenomatous colonic polyps 07/16/2014  . Benign neoplasm of ascending colon 07/16/2014  . Benign neoplasm of descending colon 07/16/2014  . Benign neoplasm of cecum 07/16/2014  .  Benign neoplasm of transverse colon 07/16/2014  . Fibromyalgia 05/30/2013  . Generalized convulsive epilepsy ([place]) 10/20/2012  . Seasonal allergies 12/08/2011  . DM (diabetes mellitus), type 2 with peripheral vascular complications ([place]) 19/14/7829  . Essential hypertension 01/05/2010  . Irritable bowel syndrome 01/05/2010  . VERTIGO 09/16/2009  . Diarrhea 09/16/2009  . Vitamin D deficiency 02/24/2009  . MYOCARDIAL INFARCTION, ACUTE, SUBENDOCARDIAL 10/04/2008  . Hypothyroidism 03/19/2008  . COLONIC POLYPS, ADENOMATOUS, HX OF 08/07/2007  . HEMANGIOMA 04/26/2007  . Dyslipidemia 04/26/2007  . OBESITY 04/26/2007  . Depression 04/26/2007  . SLEEP APNEA, OBSTRUCTIVE 04/26/2007  . HIATAL HERNIA 04/26/2007  . FATTY LIVER DISEASE 04/26/2007    Immunization History  Administered Date(s) Administered  . Fluad Quad(high Dose 65+) 12/22/2018  . Influenza Split 10/26/2011  . Influenza, High Dose Seasonal PF 11/15/2016, 09/27/2017  . Influenza,inj,Quad PF,6+ Mos 10/25/2012, 11/25/2015  . Influenza-Unspecified 10/19/2014  . PFIZER(Purple Top)SARS-COV-2 Vaccination 04/27/2019, 05/25/2019  . Pneumococcal Conjugate-13 05/24/2016  . Pneumococcal Polysaccharide-23 06/27/2017  . Td 10/04/2008    Conditions to be addressed/monitored:  Hypertension, Hyperlipidemia, Diabetes, Coronary Artery Disease and GERD  Care Plan : [place]  Updates made by Charlton Haws, [place] since 03/07/2020 12:00 AM    Problem: Hypertension, Hyperlipidemia, Diabetes, Coronary Artery Disease and GERD   Priority: High    Long-Range Goal: Disease management   Start Date: 03/07/2020  Expected End Date: 03/07/2021  This  Visit's Progress: On track  Priority: High  Note:   Current Barriers:  . Unable to independently monitor therapeutic efficacy  Pharmacist Clinical Goal(s):  [name] Over the next 180 days, patient will achieve adherence to monitoring guidelines and medication adherence to achieve therapeutic efficacy through collaboration with PharmD and provider.   Interventions: . 1:1 collaboration with Binnie Rail, MD regarding development and update of comprehensive plan of care as evidenced by provider attestation and co-signature . Inter-disciplinary care team collaboration (see longitudinal plan of care) . Comprehensive medication review performed; medication list updated in electronic medical record  Hypertension (BP goal < 130/80) Controlled Current regimen:   Losartan 50 mg daily Interventions: ? Discussed BP goals and benefits of medications for prevention of heart attack / stroke ? Discussed stress, pain can increase blood pressure temporarily Patient self care activities  ? Check BP daily, document, and provide at future appointments ? Ensure daily salt intake < 2300 mg/day   Hyperlipidemia / CAD (LDL goal < 70) Uncontrolled - w/ hx of MI ideal LDL < 70 Current regimen:  ? Simvastatin 20 mg daily Interventions: ? Discussed cholesterol goals and benefits of medications for prevention of heart attack / stroke ? Recommend f/u with PCP to recheck lipids. If LDL still > 70 can change to rosuvastatin for greater LDL-lowering Patient self care activities  ? Continue medication as prescribed   Diabetes (A1c goal < 7%) Controlled  - potentially overcontrolled (A1c 5.8%)  Current regimen:  ? Glipizide 10 mg twice a day Interventions: ? Discussed A1c goals and benefits of medications for diabetic complications ? Pt has stopped metformin due to GI side effects; she reports BG is well controlled (FBG 80-100) off of metformin and stomach problems are completely resolved Patient self care  activities  ? Check blood sugar once daily and in the morning before eating or drinking, document, and provide at future appointments ? Contact provider with any episodes of hypoglycemia   GERD Controlled - query necessity of PPI, pt denies symptoms Current regimen:  ? No medications Interventions: ?  Recommend trial off PPI to determine necessity - pt reports no symptoms of heartburn off omeprazole, she has discontinued it Patient self care activities  ? Continue to monitor for symptoms; may take omeprazole as needed  Patient Goals/Self-Care Activities . Over the next 180 days, patient will:  - take medications as prescribed focus on medication adherence by pill box check glucose daily, document, and provide at future appointments   Follow Up Plan: Telephone follow up appointment with care management team member scheduled for: 6 months      Medication Assistance: None required.  Patient affirms current coverage meets needs.  [facility]'s [id number]: [phone]: [phone] Uses pill box? Yes Pt endorses 100% compliance  We discussed: Current pharmacy is preferred with insurance plan and patient is satisfied with pharmacy services Patient decided to: Continue current medication management strategy  Care Plan and Follow Up Patient Decision:  Patient agrees to Care Plan and Follow-up.  Plan: Telephone follow up appointment with care management team member scheduled for:  6 months  LCharlene Brooke PharmD, BMarietta Eye SurgeryClinical Pharmacist LMill CreekPrimary Care at [phone]

## 2020-03-07 NOTE — Patient Instructions (Signed)
Visit Information  Phone number for Pharmacist: [phone]  Goals Addressed   None    Patient Care Plan: CCM Pharmacy Care Plan    Problem Identified: Hypertension, Hyperlipidemia, Diabetes, Coronary Artery Disease and GERD   Priority: High    Long-Range Goal: Disease management   Start Date: 03/07/2020  Expected End Date: 03/07/2021  This Visit's Progress: On track  Priority: High  Note:   Current Barriers:  . Unable to independently monitor therapeutic efficacy  Pharmacist Clinical Goal(s):  [name] Over the next 180 days, patient will achieve adherence to monitoring guidelines and medication adherence to achieve therapeutic efficacy through collaboration with PharmD and provider.   Interventions: . 1:1 collaboration with Binnie Rail, MD regarding development and update of comprehensive plan of care as evidenced by provider attestation and co-signature . Inter-disciplinary care team collaboration (see longitudinal plan of care) . Comprehensive medication review performed; medication list updated in electronic medical record  Hypertension (BP goal < 130/80) Controlled Current regimen:   Losartan 50 mg daily Interventions: ? Discussed BP goals and benefits of medications for prevention of heart attack / stroke ? Discussed stress, pain can increase blood pressure temporarily Patient self care activities  ? Check BP daily, document, and provide at future appointments ? Ensure daily salt intake < 2300 mg/day   Hyperlipidemia / CAD (LDL goal < 70) Uncontrolled - w/ hx of MI ideal LDL < 70 Current regimen:  ? Simvastatin 20 mg daily Interventions: ? Discussed cholesterol goals and benefits of medications for prevention of heart attack / stroke ? Recommend f/u with PCP to recheck lipids. If LDL still > 70 can change to rosuvastatin for greater LDL-lowering Patient self care activities  ? Continue medication as prescribed   Diabetes (A1c goal < 7%) Controlled  - potentially  overcontrolled (A1c 5.8%)  Current regimen:  ? Glipizide 10 mg twice a day Interventions: ? Discussed A1c goals and benefits of medications for diabetic complications ? Pt has stopped metformin due to GI side effects; she reports BG is well controlled (FBG 80-100) off of metformin and stomach problems are completely resolved Patient self care activities  ? Check blood sugar once daily and in the morning before eating or drinking, document, and provide at future appointments ? Contact provider with any episodes of hypoglycemia   GERD Controlled - query necessity of PPI, pt denies symptoms Current regimen:  ? No medications Interventions: ? Recommend trial off PPI to determine necessity - pt reports no symptoms of heartburn off omeprazole, she has discontinued it Patient self care activities  ? Continue to monitor for symptoms; may take omeprazole as needed  Patient Goals/Self-Care Activities . Over the next 180 days, patient will:  - take medications as prescribed focus on medication adherence by pill box check glucose daily, document, and provide at future appointments   Follow Up Plan: Telephone follow up appointment with care management team member scheduled for: 6 months      The patient verbalized understanding of instructions, educational materials, and care plan provided today and declined offer to receive copy of patient instructions, educational materials, and care plan.  Telephone follow up appointment with pharmacy team member scheduled for: 6 months  Charlene Brooke, PharmD, [facility] Clinical Pharmacist [facility] [phone]

## 2020-03-09 ENCOUNTER — Encounter: Payer: Self-pay | Admitting: Internal Medicine

## 2020-03-11 ENCOUNTER — Other Ambulatory Visit: Payer: Self-pay | Admitting: Internal Medicine

## 2020-03-16 ENCOUNTER — Other Ambulatory Visit: Payer: Self-pay | Admitting: Internal Medicine

## 2020-03-20 ENCOUNTER — Other Ambulatory Visit: Payer: Self-pay | Admitting: Internal Medicine

## 2020-04-04 ENCOUNTER — Other Ambulatory Visit: Payer: Self-pay | Admitting: Family

## 2020-04-07 NOTE — Telephone Encounter (Signed)
Due for follow up - I did refill her medication

## 2020-04-08 NOTE — Telephone Encounter (Signed)
Message left for patient to contact office and set up follow up.

## 2020-04-11 ENCOUNTER — Other Ambulatory Visit: Payer: Self-pay | Admitting: Neurology

## 2020-04-11 DIAGNOSIS — G43109 Migraine with aura, not intractable, without status migrainosus: Secondary | ICD-10-CM

## 2020-04-11 DIAGNOSIS — G40309 Generalized idiopathic epilepsy and epileptic syndromes, not intractable, without status epilepticus: Secondary | ICD-10-CM

## 2020-04-14 NOTE — Patient Instructions (Signed)
  Blood work was ordered.     Medications changes include :     Your prescription(s) have been submitted to your pharmacy. Please take as directed and contact our office if you believe you are having problem(s) with the medication(s).   A referral was ordered for        Someone from their office will call you to schedule an appointment.    Please followup in 6 months  

## 2020-04-14 NOTE — Progress Notes (Signed)
Subjective:    Patient ID: Alexis Grant, female    DOB: 23-Jul-1950, 70 y.o.   MRN: [id number]  HPI The patient is here for follow up of their chronic medical problems, including pain management for severe OA in hips, htn, DM, hyperlipidemia, hypothyroidism, depression, insomnia, overactive bladder      Medications and allergies reviewed with patient and updated if appropriate.  Patient Active Problem List   Diagnosis Date Noted  . COVID-19 virus infection 02/05/2020  . Bilateral leg edema 06/01/2019  . Overactive bladder 11/23/2018  . Frequent UTI 05/03/2018  . Lump of skin 12/30/2017  . Watery eyes 12/30/2017  . Polyneuropathy 09/27/2017  . Osteoarthritis, hip, bilateral 05/25/2017  . Osteopenia 11/22/2016  . GERD (gastroesophageal reflux disease) 11/15/2016  . Difficulty sleeping 05/24/2016  . Rosacea 11/25/2015  . Poor balance 11/25/2015  . Tremor 04/18/2015  . Other fatigue 02/17/2015  . Migraine with aura and without status migrainosus, not intractable 12/04/2014  . Localization-related (focal) (partial) idiopathic epilepsy and epileptic syndromes with seizures of localized onset, not intractable, without status epilepticus ([place]) 12/04/2014  . Hx of adenomatous colonic polyps 07/16/2014  . Benign neoplasm of ascending colon 07/16/2014  . Benign neoplasm of descending colon 07/16/2014  . Benign neoplasm of cecum 07/16/2014  . Benign neoplasm of transverse colon 07/16/2014  . Fibromyalgia 05/30/2013  . Generalized convulsive epilepsy ([place]) 10/20/2012  . Seasonal allergies 12/08/2011  . DM (diabetes mellitus), type 2 with peripheral vascular complications ([place]) 21/30/8657  . Essential hypertension 01/05/2010  . Irritable bowel syndrome 01/05/2010  . VERTIGO 09/16/2009  . Diarrhea 09/16/2009  . Vitamin D deficiency 02/24/2009  . MYOCARDIAL INFARCTION, ACUTE, SUBENDOCARDIAL 10/04/2008  . Hypothyroidism 03/19/2008  . COLONIC POLYPS, ADENOMATOUS, HX OF 08/07/2007   . HEMANGIOMA 04/26/2007  . Dyslipidemia 04/26/2007  . OBESITY 04/26/2007  . Depression 04/26/2007  . SLEEP APNEA, OBSTRUCTIVE 04/26/2007  . HIATAL HERNIA 04/26/2007  . FATTY LIVER DISEASE 04/26/2007    Current Outpatient Medications on File Prior to Visit  Medication Sig Dispense Refill  . acetaminophen (TYLENOL) 500 MG tablet Take 1 tablet (500 mg total) by mouth every 6 (six) hours as needed. 30 tablet 3  . acetaminophen-codeine (TYLENOL #3) 300-30 MG tablet TAKE 2 TABLETS BY MOUTH TWICE DAILY AS NEEDED FOR MODERATE PAIN 120 tablet 0  . albuterol (VENTOLIN HFA) 108 (90 Base) MCG/ACT inhaler Inhale 2 puffs into the lungs every 6 (six) hours as needed for wheezing or shortness of breath. 8 g 5  . blood glucose meter kit and supplies KIT Dispense based on patient and insurance preference. Use up to four times daily as directed. (FOR E11.9). 1 each 0  . divalproex (DEPAKOTE) 500 MG DR tablet Take 1 tablet by mouth twice daily 120 tablet 0  . fluorometholone (FML) 0.1 % ophthalmic suspension SMARTSIG:In Eye(s)    . furosemide (LASIX) 20 MG tablet TAKE 1 TABLET BY MOUTH EVERY DAY 90 tablet 1  . glipiZIDE (GLUCOTROL) 10 MG tablet TAKE 1 TABLET (10 MG TOTAL) BY MOUTH 2 (TWO) TIMES DAILY BEFORE A MEAL. 180 tablet 1  . levothyroxine (SYNTHROID) 75 MCG tablet TAKE 1 TABLET BY MOUTH ONCE DAILY BEFORE BREAKFAST 90 tablet 1  . loperamide (IMODIUM) 2 MG capsule Take 1 capsule (2 mg total) by mouth 4 (four) times daily as needed for diarrhea or loose stools. 12 capsule 0  . losartan (COZAAR) 50 MG tablet TAKE 1 TABLET BY MOUTH EVERY DAY 90 tablet 1  . ondansetron (ZOFRAN-ODT) 4  MG disintegrating tablet Take 1 tablet (4 mg total) by mouth every 8 (eight) hours as needed for nausea or vomiting. 20 tablet 0  . oxybutynin (DITROPAN XL) 15 MG 24 hr tablet TAKE 1 TABLET BY MOUTH EVERYDAY AT BEDTIME 90 tablet 1  . sertraline (ZOLOFT) 100 MG tablet TAKE 1 TABLET BY MOUTH EVERY DAY 90 tablet 1  . simvastatin  (ZOCOR) 20 MG tablet TAKE 1 TABLET BY MOUTH EVERYDAY AT BEDTIME 90 tablet 1  . topiramate (TOPAMAX) 200 MG tablet Take 1 tablet (200 mg total) by mouth 2 (two) times daily. Note change in dose.. pt know that this is a 272m pill not the 1052mshe is use to taken, 180 tablet 1  . traZODone (DESYREL) 50 MG tablet TAKE 2 TABLETS BY MOUTH AT BEDTIME FOR SLEEP 180 tablet 0   No current facility-administered medications on file prior to visit.    Past Medical History:  Diagnosis Date  . Adenomatous polyp of colon 2008  . Allergy   . Anemia   . Anxiety   . Arthritis   . Blood transfusion without reported diagnosis    following knee surgery   . Cataract    hx- removed both eyes  . Depression   . Diabetes mellitus    type II   . Dyslipidemia   . Fibromyalgia   . Gallstones   . Gastritis   . GERD (gastroesophageal reflux disease)   . Hemangioma   . Hiatal hernia    Fibromyalgia  . Hyperlipidemia   . Hypertension   . Hypothyroidism 2010   Dr.Deveshawr  . IBS (irritable bowel syndrome)    Dr StFuller Plan. Migraines   . Myocardial infarct ([facility]   subendocardrial, following R TKR  . Obesity   . OSA (obstructive sleep apnea)    cpap - does not know settings   . Pneumonia   . Seizure disorder ([facility]  . Seizures ([facility]   last seizure was 2002 per pt 07-29-16  . Sleep apnea   . Urinary tract infection     Past Surgical History:  Procedure Laterality Date  . ABDOMINAL HYSTERECTOMY  1988  . CARDIAC CATHETERIZATION  2007   DrGamble ( now seeing Dr HaTonita [facility])  . CHOLECYSTECTOMY  1985  . COLONOSCOPY    . COLONOSCOPY WITH PROPOFOL N/A 07/16/2014   Procedure: COLONOSCOPY WITH PROPOFOL;  Surgeon: MaLadene ArtistMD;  Location: [facility];  Service: Endoscopy;  Laterality: N/A;  . COLONOSCOPY WITH PROPOFOL N/A 09/21/2016   Procedure: COLONOSCOPY WITH PROPOFOL;  Surgeon: StLadene ArtistMD;  Location: [facility];  Service: Endoscopy;  Laterality: N/A;  . POLYPECTOMY   2012   6 or 7 adenomatous, Dr.Stark  . steroid injection to left si joint  12/2008   Dr.Newton   . TOTAL KNEE ARTHROPLASTY Right 11/2005  . TOTAL KNEE ARTHROPLASTY Left   . TUBAL LIGATION  1986    Social History   Socioeconomic History  . Marital status: Widowed    Spouse name: Not on file  . Number of children: 3  . Years of education: college  . Highest education level: Not on file  Occupational History  . Occupation: Retired  Tobacco Use  . Smoking status: Never Smoker  . Smokeless tobacco: Never Used  Vaping Use  . Vaping Use: Never used  Substance and Sexual Activity  . Alcohol use: No    Alcohol/week: 0.0 standard drinks  . Drug use: No  . Sexual activity:  Not Currently  Other Topics Concern  . Not on file  Social History Narrative   Illicit drug use- no   Patient does not get regular exercise due to knee.   Patient lives at home alone.    Patient son and wife lives next door.   Patient has 3 children.    Patient has some college.    Patient retired May 2014.    Social Determinants of Health   Financial Resource Strain: Low Risk   . Difficulty of Paying Living Expenses: Not hard at all  Food Insecurity: No Food Insecurity  . Worried About Charity fundraiser in the Last Year: Never true  . Ran Out of Food in the Last Year: Never true  Transportation Needs: No Transportation Needs  . Lack of Transportation (Medical): No  . Lack of Transportation (Non-Medical): No  Physical Activity: Insufficiently Active  . Days of Exercise per Week: 7 days  . Minutes of Exercise per Session: 20 min  Stress: Stress Concern Present  . Feeling of Stress : Rather much  Social Connections: Socially Isolated  . Frequency of Communication with Friends and Family: More than three times a week  . Frequency of Social Gatherings with Friends and Family: Once a week  . Attends Religious Services: Never  . Active Member of Clubs or Organizations: No  . Attends Archivist  Meetings: Never  . Marital Status: Widowed    Family History  Problem Relation Age of Onset  . Colon polyps Mother   . Ulcers Father   . Colon polyps Brother   . Diabetes Sister   . Kidney failure Sister   . Diabetes Paternal Aunt   . Esophageal cancer Brother   . Kidney cancer Sister   . Renal cancer Sister   . Colon cancer Neg Hx   . Heart attack Neg Hx   . Stroke Neg Hx   . Cancer Neg Hx   . Gallbladder disease Neg Hx   . Rectal cancer Neg Hx   . Stomach cancer Neg Hx     Review of Systems     Objective:  There were no vitals filed for this visit. BP Readings from Last 3 Encounters:  08/22/19 128/84  08/15/19 (!) 157/69  06/01/19 128/74   Wt Readings from Last 3 Encounters:  10/23/19 275 lb (124.7 kg)  08/22/19 (!) 303 lb (137.4 kg)  08/11/19 (!) 300 lb (136.1 kg)   There is no height or weight on file to calculate BMI.   Physical Exam    Constitutional: Appears well-developed and well-nourished. No distress.  HENT:  Head: Normocephalic and atraumatic.  Neck: Neck supple. No tracheal deviation present. No thyromegaly present.  No cervical lymphadenopathy Cardiovascular: Normal rate, regular rhythm and normal heart sounds.   No murmur heard. No carotid bruit .  No edema Pulmonary/Chest: Effort normal and breath sounds normal. No respiratory distress. No has no wheezes. No rales.  Skin: Skin is warm and dry. Not diaphoretic.  Psychiatric: Normal mood and affect. Behavior is normal.      Assessment & Plan:    See Problem List for Assessment and Plan of chronic medical problems.    This visit occurred during the SARS-CoV-2 public health emergency.  Safety protocols were in place, including screening questions prior to the visit, additional usage of staff PPE, and extensive cleaning of exam room while observing appropriate contact time as indicated for disinfecting solutions.    This encounter was created in error -  please disregard.

## 2020-04-15 ENCOUNTER — Encounter: Payer: Medicare Other | Admitting: Internal Medicine

## 2020-04-15 DIAGNOSIS — E039 Hypothyroidism, unspecified: Secondary | ICD-10-CM

## 2020-04-15 DIAGNOSIS — E1151 Type 2 diabetes mellitus with diabetic peripheral angiopathy without gangrene: Secondary | ICD-10-CM

## 2020-04-15 DIAGNOSIS — I10 Essential (primary) hypertension: Secondary | ICD-10-CM

## 2020-04-15 DIAGNOSIS — E785 Hyperlipidemia, unspecified: Secondary | ICD-10-CM

## 2020-04-15 DIAGNOSIS — F3289 Other specified depressive episodes: Secondary | ICD-10-CM

## 2020-04-15 DIAGNOSIS — G479 Sleep disorder, unspecified: Secondary | ICD-10-CM

## 2020-04-15 DIAGNOSIS — N3281 Overactive bladder: Secondary | ICD-10-CM

## 2020-04-15 DIAGNOSIS — M160 Bilateral primary osteoarthritis of hip: Secondary | ICD-10-CM

## 2020-04-17 NOTE — Progress Notes (Signed)
Subjective:    Patient ID: Alexis Grant, female    DOB: [birth year], 70 y.o.   MRN: [id number]  HPI The patient is here for follow up of their chronic medical problems, including pain management for severe OA in hips, htn, DM, hyperlipidemia, hypothyroidism, depression, insomnia, overactive bladder  She is taking all of her medications as prescribed.    Sugars at home around 80.   Medications and allergies reviewed with patient and updated if appropriate.  Patient Active Problem List   Diagnosis Date Noted  . COVID-19 virus infection 02/05/2020  . Overactive bladder 11/23/2018  . Frequent UTI 05/03/2018  . Lump of skin 12/30/2017  . Watery eyes 12/30/2017  . Polyneuropathy 09/27/2017  . Osteoarthritis, hip, bilateral 05/25/2017  . Osteopenia 11/22/2016  . GERD (gastroesophageal reflux disease) 11/15/2016  . Difficulty sleeping 05/24/2016  . Rosacea 11/25/2015  . Poor balance 11/25/2015  . Tremor 04/18/2015  . Other fatigue 02/17/2015  . Migraine with aura and without status migrainosus, not intractable 12/04/2014  . Localization-related (focal) (partial) idiopathic epilepsy and epileptic syndromes with seizures of localized onset, not intractable, without status epilepticus ([place]) 12/04/2014  . Hx of adenomatous colonic polyps 07/16/2014  . Benign neoplasm of ascending colon 07/16/2014  . Benign neoplasm of descending colon 07/16/2014  . Benign neoplasm of cecum 07/16/2014  . Benign neoplasm of transverse colon 07/16/2014  . Fibromyalgia 05/30/2013  . Generalized convulsive epilepsy ([place]) 10/20/2012  . Seasonal allergies 12/08/2011  . DM (diabetes mellitus), type 2 with peripheral vascular complications ([place]) 21/30/8657  . Essential hypertension 01/05/2010  . Irritable bowel syndrome 01/05/2010  . VERTIGO 09/16/2009  . Diarrhea 09/16/2009  . Vitamin D deficiency 02/24/2009  . MYOCARDIAL INFARCTION, ACUTE, SUBENDOCARDIAL 10/04/2008  . Hypothyroidism 03/19/2008  .  COLONIC POLYPS, ADENOMATOUS, HX OF 08/07/2007  . HEMANGIOMA 04/26/2007  . Dyslipidemia 04/26/2007  . OBESITY 04/26/2007  . Depression 04/26/2007  . SLEEP APNEA, OBSTRUCTIVE 04/26/2007  . HIATAL HERNIA 04/26/2007  . FATTY LIVER DISEASE 04/26/2007    Current Outpatient Medications on File Prior to Visit  Medication Sig Dispense Refill  . acetaminophen (TYLENOL) 500 MG tablet Take 1 tablet (500 mg total) by mouth every 6 (six) hours as needed. 30 tablet 3  . acetaminophen-codeine (TYLENOL #3) 300-30 MG tablet TAKE 2 TABLETS BY MOUTH TWICE DAILY AS NEEDED FOR MODERATE PAIN 120 tablet 0  . albuterol (VENTOLIN HFA) 108 (90 Base) MCG/ACT inhaler Inhale 2 puffs into the lungs every 6 (six) hours as needed for wheezing or shortness of breath. 8 g 5  . blood glucose meter kit and supplies KIT Dispense based on patient and insurance preference. Use up to four times daily as directed. (FOR E11.9). 1 each 0  . divalproex (DEPAKOTE) 500 MG DR tablet Take 1 tablet by mouth twice daily 120 tablet 0  . glipiZIDE (GLUCOTROL) 10 MG tablet TAKE 1 TABLET (10 MG TOTAL) BY MOUTH 2 (TWO) TIMES DAILY BEFORE A MEAL. 180 tablet 1  . levothyroxine (SYNTHROID) 75 MCG tablet TAKE 1 TABLET BY MOUTH ONCE DAILY BEFORE BREAKFAST 90 tablet 1  . loperamide (IMODIUM) 2 MG capsule Take 1 capsule (2 mg total) by mouth 4 (four) times daily as needed for diarrhea or loose stools. 12 capsule 0  . losartan (COZAAR) 50 MG tablet TAKE 1 TABLET BY MOUTH EVERY DAY 90 tablet 1  . ondansetron (ZOFRAN-ODT) 4 MG disintegrating tablet Take 1 tablet (4 mg total) by mouth every 8 (eight) hours as needed for nausea or  vomiting. 20 tablet 0  . sertraline (ZOLOFT) 100 MG tablet TAKE 1 TABLET BY MOUTH EVERY DAY 90 tablet 1  . simvastatin (ZOCOR) 20 MG tablet TAKE 1 TABLET BY MOUTH EVERYDAY AT BEDTIME 90 tablet 1  . topiramate (TOPAMAX) 200 MG tablet Take 1 tablet (200 mg total) by mouth 2 (two) times daily. Note change in dose.. pt know that this is  a 291m pill not the 1013mshe is use to taken, 180 tablet 1  . traZODone (DESYREL) 50 MG tablet TAKE 2 TABLETS BY MOUTH AT BEDTIME FOR SLEEP 180 tablet 0  . furosemide (LASIX) 20 MG tablet TAKE 1 TABLET BY MOUTH EVERY DAY (Patient not taking: Reported on 04/18/2020) 90 tablet 1   No current facility-administered medications on file prior to visit.    Past Medical History:  Diagnosis Date  . Adenomatous polyp of colon 2008  . Allergy   . Anemia   . Anxiety   . Arthritis   . Blood transfusion without reported diagnosis    following knee surgery   . Cataract    hx- removed both eyes  . Depression   . Diabetes mellitus    type II   . Dyslipidemia   . Fibromyalgia   . Gallstones   . Gastritis   . GERD (gastroesophageal reflux disease)   . Hemangioma   . Hiatal hernia    Fibromyalgia  . Hyperlipidemia   . Hypertension   . Hypothyroidism 2010   Dr.Deveshawr  . IBS (irritable bowel syndrome)    Dr StFuller Plan. Migraines   . [place] infarct ([facility]   subendocardrial, following R TKR  . Obesity   . OSA (obstructive sleep apnea)    cpap - does not know settings   . Pneumonia   . Seizure disorder ([facility]  . Seizures ([facility]   last seizure was 2002 per pt 07-29-16  . Sleep apnea   . Urinary tract infection     Past Surgical History:  Procedure Laterality Date  . ABDOMINAL HYSTERECTOMY  1988  . CARDIAC CATHETERIZATION  2007   DrGamble ( now seeing Dr HaTonita [facility]ardiology)  . CHOLECYSTECTOMY  1985  . COLONOSCOPY    . COLONOSCOPY WITH PROPOFOL N/A 07/16/2014   Procedure: COLONOSCOPY WITH PROPOFOL;  Surgeon: MaLadene ArtistMD;  [facility];  Service: Endoscopy;  Laterality: N/A;  . COLONOSCOPY WITH PROPOFOL N/A 09/21/2016   Procedure: COLONOSCOPY WITH PROPOFOL;  Surgeon: StLadene ArtistMD;  Location[facility];  Service: Endoscopy;  Laterality: N/A;  . POLYPECTOMY  2012   6 or 7 adenomatous, Dr.Stark  . steroid injection to left si joint  12/2008    Dr.Newton   . TOTAL KNEE ARTHROPLASTY Right 11/2005  . TOTAL KNEE ARTHROPLASTY Left   . TUBAL LIGATION  1986    Social History   Socioeconomic History  . Marital status: Widowed    Spouse name: Not on file  . Number of children: 3  . Years of education: college  . Highest education level: Not on file  Occupational History  . Occupation: Retired  Tobacco Use  . Smoking status: Never Smoker  . Smokeless tobacco: Never Used  Vaping Use  . Vaping Use: Never used  Substance and Sexual Activity  . Alcohol use: No    Alcohol/week: 0.0 standard drinks  . Drug use: No  . Sexual activity: Not Currently  Other Topics Concern  . Not on file  Social History Narrative   Illicit  drug use- no   Patient does not get regular exercise due to knee.   Patient lives at home alone.    Patient son and wife lives next door.   Patient has 3 children.    Patient has some college.    Patient retired May 2014.    Social Determinants of Health   Financial Resource Strain: Low Risk   . Difficulty of Paying Living Expenses: Not hard at all  Food Insecurity: No Food Insecurity  . Worried About Charity fundraiser in the Last Year: Never true  . Ran Out of Food in the Last Year: Never true  Transportation Needs: No Transportation Needs  . Lack of Transportation (Medical): No  . Lack of Transportation (Non-Medical): No  Physical Activity: Insufficiently Active  . Days of Exercise per Week: 7 days  . Minutes of Exercise per Session: 20 min  Stress: Stress Concern Present  . Feeling of Stress : Rather much  Social Connections: Socially Isolated  . Frequency of Communication with Friends and Family: More than three times a week  . Frequency of Social Gatherings with Friends and Family: Once a week  . Attends Religious Services: Never  . Active Member of Clubs or Organizations: No  . Attends Archivist Meetings: Never  . Marital Status: Widowed    Family History  Problem Relation Age  of Onset  . Colon polyps Mother   . Ulcers Father   . Colon polyps Brother   . Diabetes Sister   . Kidney failure Sister   . Diabetes Paternal Aunt   . Esophageal cancer Brother   . Kidney cancer Sister   . Renal cancer Sister   . Colon cancer Neg Hx   . Heart attack Neg Hx   . Stroke Neg Hx   . Cancer Neg Hx   . Gallbladder disease Neg Hx   . Rectal cancer Neg Hx   . Stomach cancer Neg Hx     Review of Systems  Constitutional: Negative for chills and fever.  HENT: Positive for congestion and postnasal drip (seasonal allergy related).        Last few days - knots under ears that are sensitive  Respiratory: Positive for shortness of breath (mild) and wheezing (mild). Negative for cough.   Cardiovascular: Negative for chest pain, palpitations and leg swelling.  Musculoskeletal: Positive for arthralgias.  Neurological: Positive for headaches. Negative for light-headedness.       Objective:   Vitals:   04/18/20 1424  BP: 130/72  Pulse: 92  Temp: 98.2 F (36.8 C)  SpO2: 98%   BP Readings from Last 3 Encounters:  04/18/20 130/72  08/22/19 128/84  08/15/19 (!) 157/69   Wt Readings from Last 3 Encounters:  04/18/20 281 lb (127.5 kg)  10/23/19 275 lb (124.7 kg)  08/22/19 (!) 303 lb (137.4 kg)   Body mass index is 49.78 kg/m.   Physical Exam    Constitutional: Appears well-developed and well-nourished. No distress.  HENT:  Head: Normocephalic and atraumatic.  Neck: Neck supple. No tracheal deviation present. No thyromegaly present.  No cervical lymphadenopathy Cardiovascular: Normal rate, regular rhythm and normal heart sounds.   No murmur heard. No carotid bruit .  No edema Pulmonary/Chest: Effort normal and breath sounds normal. No respiratory distress. No has no wheezes. No rales.  Skin: Skin is warm and dry. Not diaphoretic.  Psychiatric: Normal mood and affect. Behavior is normal.      Assessment & Plan:  See Problem List for Assessment and Plan of  chronic medical problems.    This visit occurred during the SARS-CoV-2 public health emergency.  Safety protocols were in place, including screening questions prior to the visit, additional usage of staff PPE, and extensive cleaning of exam room while observing appropriate contact time as indicated for disinfecting solutions.

## 2020-04-17 NOTE — Patient Instructions (Addendum)
  Blood work was ordered.     Medications changes include :   Decrease glipizide to 10 mg daily with breakfast only    Please followup in 6 months

## 2020-04-18 ENCOUNTER — Other Ambulatory Visit: Payer: Self-pay

## 2020-04-18 ENCOUNTER — Ambulatory Visit (INDEPENDENT_AMBULATORY_CARE_PROVIDER_SITE_OTHER): Payer: Medicare Other | Admitting: Internal Medicine

## 2020-04-18 ENCOUNTER — Encounter: Payer: Self-pay | Admitting: Internal Medicine

## 2020-04-18 VITALS — BP 130/72 | HR 92 | Temp 98.2°F | Ht 63.0 in | Wt 281.0 lb

## 2020-04-18 DIAGNOSIS — E039 Hypothyroidism, unspecified: Secondary | ICD-10-CM | POA: Diagnosis not present

## 2020-04-18 DIAGNOSIS — N3281 Overactive bladder: Secondary | ICD-10-CM | POA: Diagnosis not present

## 2020-04-18 DIAGNOSIS — E785 Hyperlipidemia, unspecified: Secondary | ICD-10-CM

## 2020-04-18 DIAGNOSIS — M160 Bilateral primary osteoarthritis of hip: Secondary | ICD-10-CM | POA: Diagnosis not present

## 2020-04-18 DIAGNOSIS — I10 Essential (primary) hypertension: Secondary | ICD-10-CM | POA: Diagnosis not present

## 2020-04-18 DIAGNOSIS — G479 Sleep disorder, unspecified: Secondary | ICD-10-CM

## 2020-04-18 DIAGNOSIS — F3289 Other specified depressive episodes: Secondary | ICD-10-CM

## 2020-04-18 DIAGNOSIS — R197 Diarrhea, unspecified: Secondary | ICD-10-CM

## 2020-04-18 DIAGNOSIS — E1151 Type 2 diabetes mellitus with diabetic peripheral angiopathy without gangrene: Secondary | ICD-10-CM

## 2020-04-18 DIAGNOSIS — E559 Vitamin D deficiency, unspecified: Secondary | ICD-10-CM | POA: Diagnosis not present

## 2020-04-18 LAB — COMPREHENSIVE METABOLIC PANEL
ALT: 7 U/L (ref 0–35)
AST: 9 U/L (ref 0–37)
Albumin: 3.8 g/dL (ref 3.5–5.2)
Alkaline Phosphatase: 53 U/L (ref 39–117)
BUN: 20 mg/dL (ref 6–23)
CO2: 26 mEq/L (ref 19–32)
Calcium: 8.8 mg/dL (ref 8.4–10.5)
Chloride: 105 mEq/L (ref 96–112)
Creatinine, Ser: 0.62 mg/dL (ref 0.40–1.20)
GFR: 90.89 mL/min (ref >60.00)
Glucose, Bld: 138 mg/dL — ABNORMAL HIGH (ref 70–99)
Potassium: 3.8 mEq/L (ref 3.5–5.1)
Sodium: 138 mEq/L (ref 135–145)
Total Bilirubin: 0.4 mg/dL (ref 0.2–1.2)
Total Protein: 6.4 g/dL (ref 6.0–8.3)

## 2020-04-18 LAB — CBC WITH DIFFERENTIAL/PLATELET
Basophils Absolute: 0 10*3/uL (ref 0.0–0.1)
Basophils Relative: 0.6 % (ref 0.0–3.0)
Eosinophils Absolute: 0.1 10*3/uL (ref 0.0–0.7)
Eosinophils Relative: 1.3 % (ref 0.0–5.0)
HCT: 36.3 % (ref 36.0–46.0)
Hemoglobin: 12.1 g/dL (ref 12.0–15.0)
Lymphocytes Relative: 28.9 % (ref 12.0–46.0)
Lymphs Abs: 2 10*3/uL (ref 0.7–4.0)
MCHC: 33.5 g/dL (ref 30.0–36.0)
MCV: 86.8 fl (ref 78.0–100.0)
Monocytes Absolute: 0.5 10*3/uL (ref 0.1–1.0)
Monocytes Relative: 7 % (ref 3.0–12.0)
Neutro Abs: 4.2 10*3/uL (ref 1.4–7.7)
Neutrophils Relative %: 62.2 % (ref 43.0–77.0)
Platelets: 195 10*3/uL (ref 150.0–400.0)
RBC: 4.18 Mil/uL (ref 3.87–5.11)
RDW: 16.1 % — ABNORMAL HIGH (ref 11.5–15.5)
WBC: 6.8 10*3/uL (ref 4.0–10.5)

## 2020-04-18 LAB — HEMOGLOBIN A1C: Hgb A1c MFr Bld: 5.7 % (ref 4.6–6.5)

## 2020-04-18 LAB — LIPID PANEL
Cholesterol: 131 mg/dL (ref 0–200)
HDL: 40.1 mg/dL (ref >39.00)
LDL Cholesterol: 73 mg/dL (ref 0–99)
NonHDL: 91.1
Total CHOL/HDL Ratio: 3
Triglycerides: 93 mg/dL (ref 0.0–149.0)
VLDL: 18.6 mg/dL (ref 0.0–40.0)

## 2020-04-18 LAB — VITAMIN D 25 HYDROXY (VIT D DEFICIENCY, FRACTURES): VITD: <7 ng/mL — ABNORMAL LOW (ref 30.00–100.00)

## 2020-04-18 LAB — TSH: TSH: 1.09 u[IU]/mL (ref 0.35–4.50)

## 2020-04-18 MED ORDER — GLIPIZIDE 10 MG PO TABS: 10.0000 mg | ORAL_TABLET | Freq: Every day | ORAL | 1 refills | Status: DC

## 2020-04-18 NOTE — Assessment & Plan Note (Addendum)
Chronic  Stopped oxybutynin  - does not feel like she needs it

## 2020-04-18 NOTE — Assessment & Plan Note (Signed)
Chronic Frequent Taking imodium  ? Medication related

## 2020-04-18 NOTE — Assessment & Plan Note (Signed)
Chronic Check lipid panel  Continue simvastatin 20 mg daily Regular exercise and healthy diet encouraged  

## 2020-04-18 NOTE — Addendum Note (Signed)
Addended by: Jacob Moores on: 04/18/2020 02:59 PM   Modules accepted: Orders

## 2020-04-18 NOTE — Assessment & Plan Note (Signed)
Chronic Controlled, stable Continue  sertraline 100 mg daily 

## 2020-04-18 NOTE — Assessment & Plan Note (Signed)
Chronic °Not taking vitamin D daily °Check vitamin D level ° °

## 2020-04-18 NOTE — Assessment & Plan Note (Signed)
Chronic  Clinically euthyroid Currently taking levothyroxine 75 mcg qd Check tsh  Titrate med dose if needed

## 2020-04-18 NOTE — Assessment & Plan Note (Signed)
chronic Severe b/l hip pain from OA Taking tylenol #3 - two tabs BID - she is taking the medication appropriately Continue current dose Follow up in 4 months

## 2020-04-18 NOTE — Assessment & Plan Note (Signed)
Chronic Controlled, stable Continue trazodone 100 mg nightly 

## 2020-04-18 NOTE — Assessment & Plan Note (Addendum)
Chronic BP well controlled Continue losartan 50 mg daily cmp  

## 2020-04-18 NOTE — Assessment & Plan Note (Addendum)
Chronic Check a1c Lab Results  Component Value Date   HGBA1C 5.8 (H) 08/12/2019   Controlled Sugars around 80 at home - decrease glipizide to 10 mg daily

## 2020-04-19 MED ORDER — VITAMIN D (ERGOCALCIFEROL) 1.25 MG (50000 UNIT) PO CAPS: 50000.0000 [IU] | ORAL_CAPSULE | ORAL | 0 refills | Status: AC

## 2020-04-19 NOTE — Addendum Note (Signed)
Addended by: Binnie Rail on: 04/19/2020 11:26 AM   Modules accepted: Orders

## 2020-04-28 ENCOUNTER — Other Ambulatory Visit: Payer: Self-pay

## 2020-04-28 ENCOUNTER — Telehealth: Payer: Self-pay | Admitting: Internal Medicine

## 2020-04-28 MED ORDER — SERTRALINE HCL 100 MG PO TABS: 1.0000 | ORAL_TABLET | Freq: Every day | ORAL | 1 refills | Status: DC

## 2020-04-28 NOTE — Telephone Encounter (Signed)
1.Medication Requested: sertraline (ZOLOFT) 100 MG tablet    2. Pharmacy (Name, Street, [place]): [facility], [address]  3. On Med List: yes   4. Last Visit with PCP: 04-18-20  5. Next visit date with PCP: 10-20-20   Agent: Please be advised that RX refills may take up to 3 business days. We ask that you follow-up with your pharmacy.

## 2020-04-28 NOTE — Telephone Encounter (Signed)
Sent in today for patient. 

## 2020-04-30 ENCOUNTER — Encounter: Payer: Medicare Other | Admitting: Gastroenterology

## 2020-05-07 ENCOUNTER — Other Ambulatory Visit: Payer: Self-pay | Admitting: Internal Medicine

## 2020-05-19 ENCOUNTER — Telehealth: Payer: Self-pay | Admitting: Pharmacist

## 2020-05-19 NOTE — Progress Notes (Addendum)
Chronic Care Management Pharmacy Assistant   Name: Alexis Grant  MRN: [id number] DOB: Jun 12, 1950    Reason for Encounter: Diabetic Disease State Call   Conditions to be addressed/monitored: DMII   Recent office visits:  04/18/20 Dr. Quay Burow: reduced glipizide to 10 mg once daily, added Vitamin D.  Recent consult visits:  None ID  Hospital visits:  None in previous 6 months  Medications: Outpatient Encounter Medications as of 05/19/2020  Medication Sig   acetaminophen (TYLENOL) 500 MG tablet Take 1 tablet (500 mg total) by mouth every 6 (six) hours as needed.   acetaminophen-codeine (TYLENOL #3) 300-30 MG tablet TAKE 2 TABLETS BY MOUTH TWICE DAILY AS NEEDED FOR MODERATE PAIN   albuterol (VENTOLIN HFA) 108 (90 Base) MCG/ACT inhaler Inhale 2 puffs into the lungs every 6 (six) hours as needed for wheezing or shortness of breath.   blood glucose meter kit and supplies KIT Dispense based on patient and insurance preference. Use up to four times daily as directed. (FOR E11.9).   divalproex (DEPAKOTE) 500 MG DR tablet Take 1 tablet by mouth twice daily   furosemide (LASIX) 20 MG tablet TAKE 1 TABLET BY MOUTH EVERY DAY (Patient not taking: Reported on 04/18/2020)   glipiZIDE (GLUCOTROL) 10 MG tablet Take 1 tablet (10 mg total) by mouth daily before breakfast.   levothyroxine (SYNTHROID) 75 MCG tablet TAKE 1 TABLET BY MOUTH ONCE DAILY BEFORE BREAKFAST   loperamide (IMODIUM) 2 MG capsule Take 1 capsule (2 mg total) by mouth 4 (four) times daily as needed for diarrhea or loose stools.   losartan (COZAAR) 50 MG tablet TAKE 1 TABLET BY MOUTH EVERY DAY   ondansetron (ZOFRAN-ODT) 4 MG disintegrating tablet Take 1 tablet (4 mg total) by mouth every 8 (eight) hours as needed for nausea or vomiting.   sertraline (ZOLOFT) 100 MG tablet Take 1 tablet (100 mg total) by mouth daily.   simvastatin (ZOCOR) 20 MG tablet TAKE 1 TABLET BY MOUTH EVERYDAY AT BEDTIME   topiramate (TOPAMAX) 200 MG tablet Take  1 tablet (200 mg total) by mouth 2 (two) times daily. Note change in dose.. pt know that this is a 254m pill not the 1058mshe is use to taken,   traZODone (DESYREL) 50 MG tablet TAKE 2 TABLETS BY MOUTH AT BEDTIME FOR SLEEP   Vitamin D, Ergocalciferol, (DRISDOL) 1.25 MG (50000 UNIT) CAPS capsule Take 1 capsule (50,000 Units total) by mouth every 7 (seven) days.   No facility-administered encounter medications on file as of 05/19/2020.    Recent Relevant Labs: Lab Results  Component Value Date/Time   HGBA1C 5.7 04/18/2020 02:59 PM   HGBA1C 5.8 (H) 08/12/2019 12:12 AM   MICROALBUR 4.2 (H) 11/25/2015 09:39 AM   MICROALBUR 2.0 (H) 10/01/2014 12:11 PM    Kidney Function Lab Results  Component Value Date/Time   CREATININE 0.62 04/18/2020 02:59 PM   CREATININE 0.56 08/22/2019 02:12 PM   CREATININE 0.45 08/14/2019 04:07 AM   CREATININE 0.64 04/09/2011 09:21 AM   GFR 90.89 04/18/2020 02:59 PM   GFRNONAA 96 08/22/2019 02:12 PM   GFRAA 111 08/22/2019 02:12 PM    Current antihyperglycemic regimen:  Taking glipizide 10 mg  What recent interventions/DTPs have been made to improve glycemic control:  Glipizide was decreased to 10 mg, per Dr. BuQuay BurowHave there been any recent hospitalizations or ED visits since last visit with [facility]? None ID  Patient denies hypoglycemic symptoms Patient denies hyperglycemic symptoms  How often are you checking  your blood sugar? Patient states that she checks blood sugar daily  What are your blood sugars ranging? 80-121 Fasting: this morning blood sugar was 118 Before meals:  After meals:  Bedtime:  During the week, how often does your blood glucose drop below 70? Patient states that she has not had any readings below 70  Are you checking your feet daily/regularly? Patient states that she does not have any issues with her feet  Adherence Review: Is the patient currently on a STATIN medication? Yes, simvastatin  Is the patient currently on ACE/ARB  medication? Yes, Losartan Does the patient have >5 day gap between last estimated fill dates? Yes   Star Rating Drugs: Simvastatin 12/22/19 90 ds Losartan 11/22/19 90 ds    [facility] Pharmacist Assistant [phone]  Time spent:30

## 2020-06-04 ENCOUNTER — Other Ambulatory Visit: Payer: Self-pay | Admitting: Neurology

## 2020-06-07 ENCOUNTER — Other Ambulatory Visit: Payer: Self-pay | Admitting: Internal Medicine

## 2020-06-15 ENCOUNTER — Other Ambulatory Visit: Payer: Self-pay | Admitting: Internal Medicine

## 2020-06-15 ENCOUNTER — Other Ambulatory Visit: Payer: Self-pay | Admitting: Neurology

## 2020-06-15 DIAGNOSIS — G43109 Migraine with aura, not intractable, without status migrainosus: Secondary | ICD-10-CM

## 2020-06-15 DIAGNOSIS — G40309 Generalized idiopathic epilepsy and epileptic syndromes, not intractable, without status epilepticus: Secondary | ICD-10-CM

## 2020-06-17 ENCOUNTER — Other Ambulatory Visit: Payer: Self-pay

## 2020-06-17 ENCOUNTER — Telehealth (INDEPENDENT_AMBULATORY_CARE_PROVIDER_SITE_OTHER): Payer: Medicare Other | Admitting: Neurology

## 2020-06-17 ENCOUNTER — Encounter: Payer: Self-pay | Admitting: Neurology

## 2020-06-17 VITALS — Ht 63.0 in | Wt 282.0 lb

## 2020-06-17 DIAGNOSIS — G43109 Migraine with aura, not intractable, without status migrainosus: Secondary | ICD-10-CM | POA: Diagnosis not present

## 2020-06-17 DIAGNOSIS — R251 Tremor, unspecified: Secondary | ICD-10-CM | POA: Diagnosis not present

## 2020-06-17 DIAGNOSIS — G40309 Generalized idiopathic epilepsy and epileptic syndromes, not intractable, without status epilepticus: Secondary | ICD-10-CM | POA: Diagnosis not present

## 2020-06-17 MED ORDER — AIMOVIG 70 MG/ML ~~LOC~~ SOAJ: 1.0000 "pen " | SUBCUTANEOUS | 11 refills | Status: DC

## 2020-06-17 MED ORDER — DIVALPROEX SODIUM 500 MG PO DR TAB: DELAYED_RELEASE_TABLET | ORAL | 3 refills | Status: DC

## 2020-06-17 MED ORDER — TOPIRAMATE 200 MG PO TABS: ORAL_TABLET | ORAL | 3 refills | Status: DC

## 2020-06-17 NOTE — Progress Notes (Signed)
Telephone (Audio) Visit The purpose of this telephone visit is to provide medical care while limiting exposure to the novel coronavirus.    Consent was obtained for telephone visit:  Yes.   Answered questions that patient had about telehealth interaction:  Yes.   I discussed the limitations, risks, security and privacy concerns of performing an evaluation and management service by telephone. I also discussed with the patient that there may be a patient responsible charge related to this service. The patient expressed understanding and agreed to proceed.  Pt location: Home Physician Location: office Name of referring provider:  Binnie Rail, MD I connected with .[name] at patients initiation/request on 06/17/2020 at  3:00 PM EDT by telephone and verified that I am speaking with the correct person using two identifiers.  Pt MRN:  [id number] Pt DOB:  [birth year]   History of Present Illness:  The patient had a telephone visit on 06/17/2020. Unable to connect via video due to technical difficulties. On her last visit, she reported worsening tremors, Topiramate was increase to 289m BID. She reports that over the past few months, migraines have worsened. She has migraines 3-4 times a month, she is having the worst headache today, this started 3 days ago. There is no nausea/vomiting, her face feels numb. Prior to this, she had a migraine 2 weeks ago. Tylenol eases it a little and makes her sleepy. She is very sensitive to lights and sounds. Sleep is good. She has pretty significant neuropathy affecting her legs and hands as well. No falls. She denies any seizures since 2002 on Depakote 5019mBID. Tremors are probably a little worse but bearable. She denies any side effects on medications. She was previously on gabapentin but had leg swelling.    History on Initial Assessment 11/28/2014: This is a pleasant 89o RH woman with a history of hypertension, hyperlipidemia, diabetes, obesity,  sleep apnea, CAD s/p MI, with seizures and migraines. She reports seizures started at age [age]she woke up in the hospital with no prior warning symptoms. Since then, she has had 4 generalized tonic-clonic seizures, last was in 2002. She also report 5 or 6 "little episodes" where she feels dizzy, with blurred vision, then if she sits down quickly and tries to relax, she can avoid any progression. She would feel briefly confused. No associated olfactory/gustatory hallucinations, focal numbness/tingling/weakness, myoclonic jerks. The last time she had these episodes was at least 12 years ago. She recalls taking Neurontin initially, which did not help. Topamax in the past which caused GI symptoms. She has been taking Depakote ER 50036mn AM, 1000m62m PM for many years now with no side effects. She reports having brain imaging and EEG in the past which were normal, no records available for review.   She reports migraines started at age 70. 56e has an aura of numbness in her face, cheeks, and temple, then she becomes very sensitive to lights, sounds, and smells. She sees flashing lights then her vision becomes blurred, followed by throbbing headaches in the frontal or occipital regions with associated nausea. No associated focal numbness/tingling/weakness in the extremities. She tried Imitrex with minimal effect. A course of Prednisone   She has chronic neck pain and has been dealing with diarrhea from IBS. She had an MI in 2010.   Epilepsy Risk Factors:  There is a strong family history of seizures in her paternal grandfather, paternal aunt, nephew. Otherwise she had a normal birth and early development.  There is no history of febrile convulsions, CNS infections such as meningitis/encephalitis, significant traumatic brain injury, neurosurgical procedures.   Current Outpatient Medications on File Prior to Visit  Medication Sig Dispense Refill  . acetaminophen (TYLENOL) 500 MG tablet Take 1 tablet (500 mg  total) by mouth every 6 (six) hours as needed. 30 tablet 3  . acetaminophen-codeine (TYLENOL #3) 300-30 MG tablet TAKE 2 TABLETS BY MOUTH TWICE DAILY AS NEEDED FOR MODERATE PAIN 120 tablet 0  . albuterol (VENTOLIN HFA) 108 (90 Base) MCG/ACT inhaler Inhale 2 puffs into the lungs every 6 (six) hours as needed for wheezing or shortness of breath. 8 g 5  . blood glucose meter kit and supplies KIT Dispense based on patient and insurance preference. Use up to four times daily as directed. (FOR E11.9). 1 each 0  . divalproex (DEPAKOTE) 500 MG DR tablet Take 1 tablet by mouth twice daily 120 tablet 0  . glipiZIDE (GLUCOTROL) 10 MG tablet Take 1 tablet (10 mg total) by mouth daily before breakfast. 90 tablet 1  . levothyroxine (SYNTHROID) 75 MCG tablet TAKE 1 TABLET BY MOUTH ONCE DAILY BEFORE BREAKFAST 90 tablet 1  . loperamide (IMODIUM) 2 MG capsule Take 1 capsule (2 mg total) by mouth 4 (four) times daily as needed for diarrhea or loose stools. 12 capsule 0  . losartan (COZAAR) 50 MG tablet TAKE 1 TABLET BY MOUTH EVERY DAY 90 tablet 1  . ondansetron (ZOFRAN-ODT) 4 MG disintegrating tablet Take 1 tablet (4 mg total) by mouth every 8 (eight) hours as needed for nausea or vomiting. 20 tablet 0  . sertraline (ZOLOFT) 100 MG tablet Take 1 tablet (100 mg total) by mouth daily. 90 tablet 1  . simvastatin (ZOCOR) 20 MG tablet TAKE 1 TABLET BY MOUTH EVERYDAY AT BEDTIME 90 tablet 1  . topiramate (TOPAMAX) 200 MG tablet TAKE 1 TABLET BY MOUTH TWICE DAILY. NOTE CHANGE IN DOSE. 26 tablet 0  . traZODone (DESYREL) 50 MG tablet TAKE 2 TABLETS BY MOUTH AT BEDTIME FOR SLEEP 180 tablet 0  . Vitamin D, Ergocalciferol, (DRISDOL) 1.25 MG (50000 UNIT) CAPS capsule Take 1 capsule (50,000 Units total) by mouth every 7 (seven) days. 12 capsule 0  . furosemide (LASIX) 20 MG tablet TAKE 1 TABLET BY MOUTH EVERY DAY (Patient not taking: No sig reported) 90 tablet 1   No current facility-administered medications on file prior to visit.        Observations/Objective:   Vitals:   06/17/20 1449  Weight: 282 lb (127.9 kg)  Height: '5\' 3"'  (1.6 m)   Patient briefly seen on video (no audio) with no facial asymmetry, moving all extremities symmetrically. She is awake, alert, no dysarthria or confusion noted during phone visit.   Assessment and Plan:  This is a pleasant 70 yo RH woman with a history of hypertension, hyperlipidemia, obesity, sleep apnea on CPAP, CAD s/p MI, with a history of convulsive seizures, well-controlled on Depakote with no seizures since 2002. She has had migraines since her teenage years, with initial good response to Topiramate, however she is now on 23m BID for tremors as well, with an increase in migraines. We discussed that increasing Depakote may increase tremors. Recommend starting a CGRP inhibitor, Aimovig, side effects discussed. She has had a migraine for 3 days, she declines steroid taper due to side effects and will [place] if she would like to proceed with migraine cocktail. Follow-up in 4 months, she knows to call for any changes.  Follow Up Instructions:    -I discussed the assessment and treatment plan with the patient. The patient was provided an opportunity to ask questions and all were answered. The patient agreed with the plan and demonstrated an understanding of the instructions.   The patient was advised to call back or seek an in-person evaluation if the symptoms worsen or if the condition fails to improve as anticipated.    Total Time spent in visit with the patient was:  12 minutes, of which 100% of the time was spent in counseling and/or coordinating care on the above.   Pt understands and agrees with the plan of care outlined.     Cameron Sprang, MD

## 2020-06-18 ENCOUNTER — Telehealth: Payer: Self-pay | Admitting: Neurology

## 2020-06-18 NOTE — Telephone Encounter (Signed)
Patient called and said her new pharmacy is:   [facility] [address].  She said prescriptions were sent to the wrong pharmacy, [facility], yesterday. She will call and have them transferred. FYI only.

## 2020-06-23 ENCOUNTER — Other Ambulatory Visit: Payer: Self-pay

## 2020-06-23 ENCOUNTER — Ambulatory Visit (AMBULATORY_SURGERY_CENTER): Payer: Medicare Other | Admitting: *Deleted

## 2020-06-23 VITALS — Ht 63.0 in | Wt 282.0 lb

## 2020-06-23 DIAGNOSIS — Z86010 Personal history of colonic polyps: Secondary | ICD-10-CM

## 2020-06-23 MED ORDER — PEG 3350-KCL-NA BICARB-NACL 420 G PO SOLR: 4000.0000 mL | Freq: Once | ORAL | 0 refills | Status: AC

## 2020-06-23 NOTE — Progress Notes (Signed)

## 2020-06-26 ENCOUNTER — Telehealth: Payer: Self-pay | Admitting: Neurology

## 2020-06-26 NOTE — Telephone Encounter (Signed)
Patient called and said she needs a prior authozation for Aimovig 70 MG and she's been without the medicine for a week.  Patient is experiencing a headache at this time.  [facility] on [address]

## 2020-06-26 NOTE — Telephone Encounter (Signed)
Pt called and informed that we are working on [address]

## 2020-07-07 ENCOUNTER — Ambulatory Visit (AMBULATORY_SURGERY_CENTER): Payer: Medicare Other | Admitting: Gastroenterology

## 2020-07-07 ENCOUNTER — Encounter: Payer: Self-pay | Admitting: Gastroenterology

## 2020-07-07 ENCOUNTER — Other Ambulatory Visit: Payer: Self-pay

## 2020-07-07 VITALS — BP 145/63 | HR 66 | Temp 97.5°F | Resp 12 | Ht 63.0 in | Wt 281.0 lb

## 2020-07-07 DIAGNOSIS — D123 Benign neoplasm of transverse colon: Secondary | ICD-10-CM

## 2020-07-07 DIAGNOSIS — Z86010 Personal history of colonic polyps: Secondary | ICD-10-CM

## 2020-07-07 DIAGNOSIS — D124 Benign neoplasm of descending colon: Secondary | ICD-10-CM

## 2020-07-07 DIAGNOSIS — D122 Benign neoplasm of ascending colon: Secondary | ICD-10-CM

## 2020-07-07 DIAGNOSIS — D125 Benign neoplasm of sigmoid colon: Secondary | ICD-10-CM | POA: Diagnosis not present

## 2020-07-07 MED ORDER — SODIUM CHLORIDE 0.9 % IV SOLN
500.0000 mL | Freq: Once | INTRAVENOUS | Status: DC
Start: 2020-07-07 — End: 2020-09-19

## 2020-07-07 NOTE — Progress Notes (Signed)
VS by Anthon  Pt's states no medical or surgical changes since previsit or office visit.  

## 2020-07-07 NOTE — Progress Notes (Signed)
Called to room to assist during endoscopic procedure.  Patient ID and intended procedure confirmed with present staff. Received instructions for my participation in the procedure from the performing physician.  

## 2020-07-07 NOTE — Op Note (Signed)
[place] Patient Name: Alexis Grant Procedure Date: 07/07/2020 1:30 PM MRN: [id number] Endoscopist: Ladene Artist , MD Age: 70 Referring MD:  Date of Birth: [birth year] Gender: Female Account #: [id number] Procedure:                Colonoscopy Indications:              Surveillance: Personal history of adenomatous                            polyps on last colonoscopy > 3 years ago Medicines:                Monitored Anesthesia Care Procedure:                Pre-Anesthesia Assessment:                           - Prior to the procedure, a History and Physical                            was performed, and patient medications and                            allergies were reviewed. The patient's tolerance of                            previous anesthesia was also reviewed. The risks                            and benefits of the procedure and the sedation                            options and risks were discussed with the patient.                            All questions were answered, and informed consent                            was obtained. Prior Anticoagulants: The patient has                            taken no previous anticoagulant or antiplatelet                            agents. ASA Grade Assessment: III - A patient with                            severe systemic disease. After reviewing the risks                            and benefits, the patient was deemed in                            satisfactory condition to undergo the procedure.  After obtaining informed consent, the colonoscope                            was passed under direct vision. Throughout the                            procedure, the patient's blood pressure, pulse, and                            oxygen saturations were monitored continuously. The                            Olympus CF-[id number]) Colonoscope was                            introduced through the  anus and advanced to the the                            cecum, identified by the appendiceal orifice. The                            ileocecal valve, appendiceal orifice, and rectum                            were photographed. The quality of the bowel                            preparation was adequate. The colonoscopy was                            performed without difficulty. The patient tolerated                            the procedure well. Scope In: 1:39:24 PM Scope Out: 2:03:15 PM Scope Withdrawal Time: 0 hours 17 minutes 58 seconds  Total Procedure Duration: 0 hours 23 minutes 51 seconds  Findings:                 The perianal and digital rectal examinations were                            normal.                           Six sessile polyps were found in the sigmoid colon                            (1), descending colon (2), transverse colon (2) and                            ascending colon (1). The polyps were 6 to 8 mm in                            size. These polyps were removed with a cold snare.  Resection and retrieval were complete.                           Internal hemorrhoids were found during                            retroflexion. The hemorrhoids were small and Grade                            I (internal hemorrhoids that do not prolapse).                           The exam was otherwise without abnormality on                            direct and retroflexion views. Complications:            No immediate complications. Estimated blood loss:                            None. Estimated Blood Loss:     Estimated blood loss: none. Impression:               - Six 6 to 8 mm polyps in the sigmoid colon, in the                            descending colon, in the transverse colon and in                            the ascending colon, removed with a cold snare.                            Resected and retrieved.                           -  Internal hemorrhoids.                           - The examination was otherwise normal on direct                            and retroflexion views. Recommendation:           - Repeat colonoscopy in 3 years for surveillance                            based on pathology results.                           - Patient has a contact number available for                            emergencies. The signs and symptoms of potential                            delayed complications were discussed with the  patient. Return to normal activities tomorrow.                            Written discharge instructions were provided to the                            patient.                           - Resume previous diet.                           - Continue present medications.                           - Await pathology results. Ladene Artist, MD 07/07/2020 2:08:58 PM This report has been signed electronically.

## 2020-07-07 NOTE — Progress Notes (Signed)
A/ox3, pleased with MAC, report to RN 

## 2020-07-07 NOTE — Patient Instructions (Signed)
Impression/Recommendations:  Polyp and hemorrhoid handouts given to patient.  Repeat colonoscopy in 3 years for surveillance.  Date to be determined after pathology results reviewed.  Resume previous diet. Continue present medications. Await pathology results.  YOU HAD AN ENDOSCOPIC PROCEDURE TODAY AT [facility]:   Refer to the procedure report that was given to you for any specific questions about what was found during the examination.  If the procedure report does not answer your questions, please call your gastroenterologist to clarify.  If you requested that your care partner not be given the details of your procedure findings, then the procedure report has been included in a sealed envelope for you to review at your convenience later.  YOU SHOULD EXPECT: Some feelings of bloating in the abdomen. Passage of more gas than usual.  Walking can help get rid of the air that was put into your GI tract during the procedure and reduce the bloating. If you had a lower endoscopy (such as a colonoscopy or flexible sigmoidoscopy) you may notice spotting of blood in your stool or on the toilet paper. If you underwent a bowel prep for your procedure, you may not have a normal bowel movement for a few days.  Please Note:  You might notice some irritation and congestion in your nose or some drainage.  This is from the oxygen used during your procedure.  There is no need for concern and it should clear up in a day or so.  SYMPTOMS TO REPORT IMMEDIATELY:  Following lower endoscopy (colonoscopy or flexible sigmoidoscopy):  Excessive amounts of blood in the stool  Significant tenderness or worsening of abdominal pains  Swelling of the abdomen that is new, acute  Fever of 100F or higher For urgent or emergent issues, a gastroenterologist can be reached at any hour by calling [phone]. Do not use MyChart messaging for urgent concerns.    DIET:  We do recommend a small meal at first,  but then you may proceed to your regular diet.  Drink plenty of fluids but you should avoid alcoholic beverages for 24 hours.  ACTIVITY:  You should plan to take it easy for the rest of today and you should NOT DRIVE or use heavy machinery until tomorrow (because of the sedation medicines used during the test).    FOLLOW UP: Our staff will call the number listed on your records 48-72 hours following your procedure to check on you and address any questions or concerns that you may have regarding the information given to you following your procedure. If we do not reach you, we will leave a message.  We will attempt to reach you two times.  During this call, we will ask if you have developed any symptoms of COVID 19. If you develop any symptoms (ie: fever, flu-like symptoms, shortness of breath, cough etc.) before then, please call [phone].  If you test positive for Covid 19 in the 2 weeks post procedure, please call and report this information to [place].    If any biopsies were taken you will be contacted by phone or by letter within the next 1-3 weeks.  Please call us at [phone] if you have not heard about the biopsies in 3 weeks.    SIGNATURES/CONFIDENTIALITY: You and/or your care partner have signed paperwork which will be entered into your electronic medical record.  These signatures attest to the fact that that the information above on your After Visit Summary has been reviewed and is understood.  Full responsibility of the confidentiality of this discharge information lies with you and/or your care-partner.  

## 2020-07-09 ENCOUNTER — Telehealth: Payer: Self-pay

## 2020-07-09 ENCOUNTER — Other Ambulatory Visit: Payer: Self-pay | Admitting: Internal Medicine

## 2020-07-09 NOTE — Telephone Encounter (Signed)
  Follow up Call-  Call back number 07/07/2020  Post procedure Call Back phone  # [phone]  Permission to leave phone message Yes  Some recent data might be hidden     Patient questions:  Do you have a fever, pain , or abdominal swelling? No. Pain Score  0 *  Have you tolerated food without any problems? Yes.    Have you been able to return to your normal activities? Yes.    Do you have any questions about your discharge instructions: Diet   No. Medications  No. Follow up visit  No.  Do you have questions or concerns about your Care? No.  Actions: * If pain score is 4 or above: No action needed, pain <4.  Have you developed a fever since your procedure? No   2.   Have you had an respiratory symptoms (SOB or cough) since your procedure? No   3.   Have you tested positive for COVID 19 since your procedure no   4.   Have you had any family members/close contacts diagnosed with the COVID 19 since your procedure?  No    If yes to any of these questions please route to Joylene John, RN and Joella Prince, RN

## 2020-07-11 ENCOUNTER — Encounter: Payer: Self-pay | Admitting: Gastroenterology

## 2020-07-28 ENCOUNTER — Other Ambulatory Visit: Payer: Self-pay | Admitting: Internal Medicine

## 2020-07-30 ENCOUNTER — Other Ambulatory Visit: Payer: Self-pay | Admitting: Internal Medicine

## 2020-08-01 NOTE — Progress Notes (Signed)
[employer] review forms filled out and faxed back. [phone]

## 2020-08-05 NOTE — Progress Notes (Signed)
Approved coverage for aimovig 70mg  the authorization is good until 10/30/2020. Sent to scan.

## 2020-08-06 ENCOUNTER — Other Ambulatory Visit: Payer: Self-pay | Admitting: Internal Medicine

## 2020-08-07 ENCOUNTER — Telehealth: Payer: Self-pay | Admitting: Pharmacist

## 2020-08-07 DIAGNOSIS — Z20822 Contact with and (suspected) exposure to covid-19: Secondary | ICD-10-CM | POA: Diagnosis not present

## 2020-08-07 NOTE — Progress Notes (Signed)
Chronic Care Management Pharmacy Assistant   Name: Alexis Grant  MRN: [id number] DOB: November 08, 1950  Reason for Encounter: Disease State-Diabetes  Recent office visits:  None noted  Recent consult visits:  06/17/20 Alexis Grant (Neurology) - Migraine. Start AIMOVIG 70 mg. Increase Topamax 200 mg. F/u in 4 mos.  Hospital visits:  None in previous 6 months  Medications: Outpatient Encounter Medications as of 08/07/2020  Medication Sig   acetaminophen (TYLENOL) 500 MG tablet Take 1 tablet (500 mg total) by mouth every 6 (six) hours as needed.   acetaminophen-codeine (TYLENOL #3) 300-30 MG tablet TAKE 2 TABLETS BY MOUTH TWICE DAILY AS NEEDED FOR MODERATE PAIN   albuterol (VENTOLIN HFA) 108 (90 Base) MCG/ACT inhaler Inhale 2 puffs into the lungs every 6 (six) hours as needed for wheezing or shortness of breath.   blood glucose meter kit and supplies KIT Dispense based on patient and insurance preference. Use up to four times daily as directed. (FOR E11.9).   divalproex (DEPAKOTE) 500 MG DR tablet Take 1 tablet by mouth twice daily   Erenumab-aooe (AIMOVIG) 70 MG/ML SOAJ Inject 1 pen into the skin every 30 (thirty) days. (Patient not taking: Reported on 07/07/2020)   glipiZIDE (GLUCOTROL) 10 MG tablet TAKE 1 TABLET BY MOUTH TWICE DAILY BEFORE A MEAL   levothyroxine (SYNTHROID) 75 MCG tablet TAKE 1 TABLET BY MOUTH ONCE DAILY BEFORE BREAKFAST   loperamide (IMODIUM) 2 MG capsule Take 1 capsule (2 mg total) by mouth 4 (four) times daily as needed for diarrhea or loose stools.   losartan (COZAAR) 50 MG tablet Take 1 tablet by mouth once daily   ondansetron (ZOFRAN-ODT) 4 MG disintegrating tablet Take 1 tablet (4 mg total) by mouth every 8 (eight) hours as needed for nausea or vomiting.   sertraline (ZOLOFT) 100 MG tablet Take 1 tablet (100 mg total) by mouth daily.   simvastatin (ZOCOR) 20 MG tablet TAKE 1 TABLET BY MOUTH EVERYDAY AT BEDTIME   topiramate (TOPAMAX) 200 MG tablet TAKE 1 TABLET BY  MOUTH TWICE DAILY   traZODone (DESYREL) 50 MG tablet TAKE 2 TABLETS BY MOUTH AT BEDTIME FOR SLEEP   Facility-Administered Encounter Medications as of 08/07/2020  Medication   0.9 %  sodium chloride infusion    Recent Relevant Labs: Lab Results  Component Value Date/Time   HGBA1C 5.7 04/18/2020 02:59 PM   HGBA1C 5.8 (H) 08/12/2019 12:12 AM   MICROALBUR 4.2 (H) 11/25/2015 09:39 AM   MICROALBUR 2.0 (H) 10/01/2014 12:11 PM    Kidney Function Lab Results  Component Value Date/Time   CREATININE 0.62 04/18/2020 02:59 PM   CREATININE 0.56 08/22/2019 02:12 PM   CREATININE 0.45 08/14/2019 04:07 AM   CREATININE 0.64 04/09/2011 09:21 AM   GFR 90.89 04/18/2020 02:59 PM   GFRNONAA 96 08/22/2019 02:12 PM   GFRAA 111 08/22/2019 02:12 PM    Current antihyperglycemic regimen:  Glipizide 10 mg twice a day  What recent interventions/DTPs have been made to improve glycemic control:  None listed  Have there been any recent hospitalizations or ED visits since last visit with CPP? No  Patient denies hypoglycemic symptoms, including None  Patient denies hyperglycemic symptoms, including none  How often are you checking your blood sugar? once daily  What are your blood sugars ranging?  Patient states her glucose runs between 100-110.  During the week, how often does your blood glucose drop below 70? Never  Are you checking your feet daily/regularly?  Patient states her feet are always dry  and have no feelings first thing in the mornings.  Adherence Review: Is the patient currently on a STATIN medication? Yes Is the patient currently on ACE/ARB medication? Yes Does the patient have >5 day gap between last estimated fill date? No  Star Rating Drugs: Losartan - last fill 07/30/20 90D Simvastatin - last fill 07/09/20 Alexis Grant, RMA Clinical Pharmacists Assistant [phone]  Time Spent: [phone]

## 2020-08-11 ENCOUNTER — Other Ambulatory Visit: Payer: Self-pay | Admitting: Internal Medicine

## 2020-08-15 ENCOUNTER — Telehealth: Payer: Self-pay | Admitting: Neurology

## 2020-08-15 NOTE — Telephone Encounter (Signed)
The rx is toprimate and it is 2 weeks two early, she is going to contact her pharmacy.

## 2020-08-18 DIAGNOSIS — Z20822 Contact with and (suspected) exposure to covid-19: Secondary | ICD-10-CM | POA: Diagnosis not present

## 2020-09-04 ENCOUNTER — Telehealth: Payer: Self-pay | Admitting: Pharmacist

## 2020-09-04 ENCOUNTER — Other Ambulatory Visit: Payer: Self-pay

## 2020-09-04 ENCOUNTER — Ambulatory Visit (INDEPENDENT_AMBULATORY_CARE_PROVIDER_SITE_OTHER): Payer: Medicare Other | Admitting: Pharmacist

## 2020-09-04 DIAGNOSIS — I10 Essential (primary) hypertension: Secondary | ICD-10-CM

## 2020-09-04 DIAGNOSIS — I252 Old myocardial infarction: Secondary | ICD-10-CM

## 2020-09-04 DIAGNOSIS — E785 Hyperlipidemia, unspecified: Secondary | ICD-10-CM

## 2020-09-04 DIAGNOSIS — E1151 Type 2 diabetes mellitus with diabetic peripheral angiopathy without gangrene: Secondary | ICD-10-CM

## 2020-09-04 DIAGNOSIS — G43109 Migraine with aura, not intractable, without status migrainosus: Secondary | ICD-10-CM

## 2020-09-04 MED ORDER — GLIPIZIDE 10 MG PO TABS: 10.0000 mg | ORAL_TABLET | Freq: Every day | ORAL | 0 refills | Status: DC

## 2020-09-04 NOTE — Patient Instructions (Signed)
Visit Information  Phone number for Pharmacist: [phone]   Goals Addressed             This Visit's Progress    Manage My Medicine       Timeframe:  Long-Range Goal Priority:  Medium Start Date:     09/04/20                        Expected End Date: 09/04/21                      Follow Up Date Feb 2022   - call for medicine refill 2 or 3 days before it runs out - call if I am sick and can't take my medicine - keep a list of all the medicines I take; vitamins and herbals too -Pick up [place] from pharmacy. Contact pharmacist if copay is too high    Why is this important?   These steps will help you keep on track with your medicines.   Notes:         Care Plan : CCM Pharmacy Care Plan  Updates made by Charlton Haws, RPH since 09/04/2020 12:00 AM     Problem: Hypertension, Hyperlipidemia, Diabetes, and Coronary Artery Disease, Migraine   Priority: High     Long-Range Goal: Disease management   Start Date: 03/07/2020  Expected End Date: 03/07/2021  This Visit's Progress: On track  Recent Progress: On track  Priority: High  Note:   Current Barriers:  Unable to independently monitor therapeutic efficacy  Pharmacist Clinical Goal(s):  Patient will achieve adherence to monitoring guidelines and medication adherence to achieve therapeutic efficacy through collaboration with PharmD and provider.   Interventions: 1:1 collaboration with Binnie Rail, MD regarding development and update of comprehensive plan of care as evidenced by provider attestation and co-signature Inter-disciplinary care team collaboration (see longitudinal plan of care) Comprehensive medication review performed; medication list updated in electronic medical record  Hypertension (BP goal < 130/80) Controlled - BP is at goal in recent office visits (elevated on day of colonscopy only); pt endorses compliance and denies side effects Current regimen:  Losartan 50 mg  daily Interventions: Discussed BP goals and benefits of medications for prevention of heart attack / stroke Recommend to continue current medication   Hyperlipidemia / CAD (LDL goal < 70) Controlled - LDL is close to goal (LDL 73); pt endorses compliance with statin and denies side effects Current regimen:  Simvastatin 20 mg daily Interventions: Discussed cholesterol goals and benefits of medications for prevention of heart attack / stroke Recommend to continue current medication   Diabetes (A1c goal < 7%) Controlled  - A1c is at goal; glipizide was reduced to once daily when A1c was 5.7%; pt denies hypoglycemia Fasting BG: 100-110, 135 today Current regimen:  Glipizide 10 mg daily Interventions: Counseled on hypoglycemia prevention and treatment Recommend to continue current medication  Migraine (Goal: reduce frequency) -Uncontrolled - pt reports she has ~4 migraines per month; she did not pick up Aimovig because the PA took 2 months for approval and she did not see the point in picking it up after that -Current treatment  Divalproex 500 mg BID Topiramate 200 mg BID Aimovig - not started Tylenol #3 PRN -Counseled on Aimovig indication and benefits; explained it can prevent migraines when taken regularly -Advised pt to pick up Jay from pharmacy and contact CCM team if copay is still prohibitive  Health Maintenance -Vaccine gaps: Shingrix,  covid booster -Pt reports she got one COVID booster at [facility] in Fall 2021. She did not get 4th dose -Counseled to get Shingrix vaccine at local pharmacy -Pt completed 3 months of Vitamin D replacement dose and is now taking OTC dose -Current therapy:  Vitamin D 1000 IU daily -Recommended to continue current medication  Patient Goals/Self-Care Activities Patient will:  - take medications as prescribed focus on medication adherence by pill box check glucose daily, document, and provide at future appointments  -Pick up Avella from  pharmacy and take as directed. Contact pharmacist if copay is too high -Get Shingrix vaccine at local pharmacy      Patient verbalizes understanding of instructions provided today and agrees to view in [place].  Telephone follow up appointment with pharmacy team member scheduled for: 6 months  Charlene Brooke, PharmD, [place], CPP Clinical Pharmacist [facility] [phone]

## 2020-09-04 NOTE — Progress Notes (Signed)
Chronic Care Management Pharmacy Note  09/04/2020 Name:  Alexis Grant MRN:  [id number] DOB:  [birth year]  Summary: -Pt was prescribed Aimovig in May for migraines. She never picked it up due to PA taking 2 months for approval. She is having ~4 migraines a week.  Recommendations/Changes made from today's visit: -Advised pt to pick up Aimovig and take as prescribed. Can contact CCM team for pt assistance if copay is too high -Advised to get Shingrix vaccine at local pharmacy   Subjective: Alexis Grant is an 70 y.o. year old female who is a primary patient of Burns, Claudina Lick, MD.  The CCM team was consulted for assistance with disease management and care coordination needs.    Engaged with patient by telephone for follow up visit in response to provider referral for pharmacy case management and/or care coordination services.   Consent to Services:  The patient was given information about Chronic Care Management services, agreed to services, and gave verbal consent prior to initiation of services.  Please see initial visit note for detailed documentation.   Patient Care Team: Binnie Rail, MD as PCP - General (Internal Medicine) [name] Lezlie Octave, MD as Consulting Physician (Neurology) Garald Balding, MD as Consulting Physician (Orthopedic Surgery) Chesley Mires, MD as Consulting Physician (Pulmonary Disease) Monna Fam, MD as Consulting Physician (Ophthalmology) Cameron Sprang, MD as Consulting Physician (Neurology) Charlton Haws, [facility] as Pharmacist (Pharmacist)    Pt has lived in [place] last 20 years. Born in [place], lived in [place] as well. Husband was in shopping center business ([place]). Kids were raised in [place]. Husband passed away this year. 3 children - 2 sons, 1 daughter in [place]. Last job in [employer], retired from there.   Recent office visits: 04/18/20 Dr Quay Burow OV: chronic f/u; reduced glipizide to once daily; started  Vitamin D  50,000 IU weekly; dc'd oxybutynin (pt not taking)  02/05/20 Dr Jenny Reichmann VV: COVID symptoms w/ covid-positive grandson. Not eligible for monoclonal ab. Referred to covid clinic for possible remdesevir. rx'd decadron x 10 days, Vit C, D and zinc.  08/22/19 Dr Quay Burow OV: hospital f/u. Swelling improved, continue to hold gabapentin, start lasix 20 mg daily.   Recent consult visits: 06/23/20 endoscopy center - rx'd PEG. D/c furosemide d/t pt not taking.  06/17/20 Dr [name] (neurology VV): migraine. Started Aimovig.   Hospital visits: 08/11/19 - 08/15/19 Hospital admission: N/V, diarrhea unclear cause, improved with IVF. B/l leg edema unclear cause, ECHO normal EF, oral lasix did not improve. Rec'd home PT. Hold gabapentin d/t leg swelling.  Objective:  Lab Results  Component Value Date   CREATININE 0.62 04/18/2020   BUN 20 04/18/2020   GFR 90.89 04/18/2020   GFRNONAA 96 08/22/2019   GFRAA 111 08/22/2019   NA 138 04/18/2020   K 3.8 04/18/2020   CALCIUM 8.8 04/18/2020   CO2 26 04/18/2020    Lab Results  Component Value Date/Time   HGBA1C 5.7 04/18/2020 02:59 PM   HGBA1C 5.8 (H) 08/12/2019 12:12 AM   GFR 90.89 04/18/2020 02:59 PM   GFR 112.11 06/01/2019 11:52 AM   MICROALBUR 4.2 (H) 11/25/2015 09:39 AM   MICROALBUR 2.0 (H) 10/01/2014 12:11 PM    Last diabetic Eye exam:  Lab Results  Component Value Date/Time   HMDIABEYEEXA No Retinopathy 11/14/2019 11:43 AM    Last diabetic Foot exam: No results found for: HMDIABFOOTEX   Lab Results  Component Value Date   CHOL 131 04/18/2020  HDL 40.10 04/18/2020   LDLCALC 73 04/18/2020   TRIG 93.0 04/18/2020   CHOLHDL 3 04/18/2020    Hepatic Function Latest Ref Rng & Units 04/18/2020 08/12/2019 08/11/2019  Total Protein 6.0 - 8.3 g/dL 6.4 5.7(L) 6.9  Albumin 3.5 - 5.2 g/dL 3.8 3.0(L) 3.6  AST 0 - 37 U/L 9 9(L) 11(L)  ALT 0 - 35 U/L _0 Alk Phosphatase 39 - 117 U/L 53 37(L) 49  Total Bilirubin 0.2 - 1.2 mg/dL 0.4 0.3 0.4  Bilirubin, Direct  0.0 - 0.3 mg/dL - - -    Lab Results  Component Value Date/Time   TSH 1.09 04/18/2020 02:59 PM   TSH 1.841 08/12/2019 03:55 AM   TSH 4.17 06/01/2019 11:52 AM   FREET4 0.5 (L) 01/28/2006 01:34 PM    CBC Latest Ref Rng & Units 04/18/2020 08/22/2019 08/14/2019  WBC 4.0 - 10.5 K/uL 6.8 6.1 6.5  Hemoglobin 12.0 - 15.0 g/dL 12.1 11.9 9.9(L)  Hematocrit 36.0 - 46.0 % 36.3 37.2 33.2(L)  Platelets 150.0 - 400.0 K/uL 195.0 242 145(L)    Lab Results  Component Value Date/Time   VD25OH <7.00 (L) 04/18/2020 02:59 PM   VD25OH 45 06/09/2009 08:49 PM   VD25OH 23 (L) 02/21/2009 07:43 PM    Clinical ASCVD: Yes  The ASCVD Risk score Mikey Bussing DC Jr., et al., 2013) failed to calculate for the following reasons:   The patient has a prior MI or stroke diagnosis    Depression screen [facility] 2/9 11/27/2019 11/22/2018 11/16/2017  Decreased Interest 0 0 1  Down, Depressed, Hopeless _1 PHQ - 2 Score _2 Altered sleeping - 3 3  Tired, decreased energy - 2 2  Change in appetite - 1 0  Feeling bad or failure about yourself  - 0 1  Trouble concentrating - 0 0  Moving slowly or fidgety/restless - 0 0  Suicidal thoughts - 0 0  PHQ-9 Score - 7 9  Difficult doing work/chores - Not difficult at all Not difficult at all  Some recent data might be hidden     Social History   Tobacco Use  Smoking Status Never  Smokeless Tobacco Never   BP Readings from Last 3 Encounters:  07/07/20 (!) 145/63  04/18/20 130/72  08/22/19 128/84   Pulse Readings from Last 3 Encounters:  07/07/20 66  04/18/20 92  08/22/19 (!) 104   Wt Readings from Last 3 Encounters:  07/07/20 281 lb (127.5 kg)  06/23/20 282 lb (127.9 kg)  06/17/20 282 lb (127.9 kg)   BMI Readings from Last 3 Encounters:  07/07/20 49.78 kg/m  06/23/20 49.95 kg/m  06/17/20 49.95 kg/m    Assessment/Interventions: Review of patient past medical history, allergies, medications, health status, including review of consultants reports, laboratory and  other test data, was performed as part of comprehensive evaluation and provision of chronic care management services.   SDOH:  (Social Determinants of Health) assessments and interventions performed: Yes  SDOH Screenings   Alcohol Screen: Low Risk    Last Alcohol Screening Score (AUDIT): 0  Depression (PHQ2-9): Low Risk    PHQ-2 Score: 1  Financial Resource Strain: Low Risk    Difficulty of Paying Living Expenses: Not hard at all  Food Insecurity: No Food Insecurity   Worried About Charity fundraiser in the Last Year: Never true   Ran Out of Food in the Last Year: Never true  Housing: [place]  Risk Score: 0  Physical Activity: Insufficiently Active   Days of Exercise per Week: 7 days   Minutes of Exercise per Session: 20 min  Social Connections: Socially Isolated   Frequency of Communication with Friends and Family: More than three times a week   Frequency of Social Gatherings with Friends and Family: Once a week   Attends Religious Services: Never   Marine scientist or Organizations: No   Attends Archivist Meetings: Never   Marital Status: Widowed  Stress: Stress Concern Present   Feeling of Stress : Rather much  Tobacco Use: Low Risk    Smoking Tobacco Use: Never   Smokeless Tobacco Use: Never  Transportation Needs: No Transportation Needs   Lack of Transportation (Medical): No   Lack of Transportation (Non-Medical): No     CCM Care Plan  Allergies  Allergen Reactions   Hydrocodone-Acetaminophen Itching    Itches all over; without rash per pt   Metformin And Related Other (See Comments)    GI  symptoms    Medications Reviewed Today     Reviewed by Charlton Haws, [facility] (Pharmacist) on 09/04/20 at 1355  Med List Status: <None>   Medication Order Taking? Sig Documenting Provider Last Dose Status Informant  0.9 %  sodium chloride infusion [id number]   Ladene Artist, MD  Active   acetaminophen (TYLENOL) 500 MG tablet  [id number] Yes Take 1 tablet (500 mg total) by mouth every 6 (six) hours as needed. Binnie Rail, MD Taking Active   acetaminophen-codeine (TYLENOL #3) 300-30 MG tablet [id number] Yes TAKE 2 TABLETS BY MOUTH TWICE DAILY AS NEEDED FOR MODERATE PAIN Janith Lima, MD Taking Active   albuterol (VENTOLIN HFA) 108 (90 Base) MCG/ACT inhaler [id number] Yes Inhale 2 puffs into the lungs every 6 (six) hours as needed for wheezing or shortness of breath. Biagio Borg, MD Taking Active   blood glucose meter kit and supplies KIT [id number] Yes Dispense based on patient and insurance preference. Use up to four times daily as directed. (FOR E11.9). Binnie Rail, MD Taking Active   cholecalciferol (VITAMIN D3) 25 MCG (1000 UNIT) tablet [id number] Yes Take 1,000 Units by mouth daily. [provider] Taking Active   divalproex (DEPAKOTE) 500 MG DR tablet [id number] Yes Take 1 tablet by mouth twice daily Cameron Sprang, MD Taking Active   Erenumab-aooe (AIMOVIG) 70 MG/ML SOAJ [id number] No Inject 1 pen into the skin every 30 (thirty) days.  Patient not taking: Reported on 09/04/2020   Cameron Sprang, MD Not Taking Active   glipiZIDE (GLUCOTROL) 10 MG tablet [id number]  Take 1 tablet (10 mg total) by mouth daily before breakfast. Binnie Rail, MD  Active   levothyroxine (SYNTHROID) 75 MCG tablet [id number] Yes TAKE 1 TABLET BY MOUTH ONCE DAILY BEFORE BREAKFAST Burns, Claudina Lick, MD Taking Active   loperamide (IMODIUM) 2 MG capsule [id number] Yes Take 1 capsule (2 mg total) by mouth 4 (four) times daily as needed for diarrhea or loose stools. Hayden Rasmussen, MD Taking Active   losartan (COZAAR) 50 MG tablet [id number] Yes Take 1 tablet by mouth once daily Burns, Claudina Lick, MD Taking Active   ondansetron (ZOFRAN-ODT) 4 MG disintegrating tablet [id number] Yes Take 1 tablet (4 mg total) by mouth every 8 (eight) hours as needed for nausea or vomiting. Hayden Rasmussen, MD Taking Active   sertraline (ZOLOFT) 100 MG  tablet [id number] Yes Take 1 tablet (100 mg total) by mouth daily.  Binnie Rail, MD Taking Active   simvastatin (ZOCOR) 20 MG tablet [id number] Yes TAKE 1 TABLET BY MOUTH EVERYDAY AT BEDTIME Binnie Rail, MD Taking Active   topiramate (TOPAMAX) 200 MG tablet [id number] Yes TAKE 1 TABLET BY MOUTH TWICE DAILY Cameron Sprang, MD Taking Active   traZODone (DESYREL) 50 MG tablet [id number] Yes TAKE 2 TABLETS BY MOUTH AT BEDTIME FOR SLEEP Binnie Rail, MD Taking Active             Patient Active Problem List   Diagnosis Date Noted   COVID-19 virus infection 02/05/2020   Overactive bladder 11/23/2018   Frequent UTI 05/03/2018   Lump of skin 12/30/2017   Watery eyes 12/30/2017   Polyneuropathy 09/27/2017   Osteoarthritis, hip, bilateral 05/25/2017   Osteopenia 11/22/2016   Difficulty sleeping 05/24/2016   Rosacea 11/25/2015   Poor balance 11/25/2015   Tremor 04/18/2015   Other fatigue 02/17/2015   Migraine with aura and without status migrainosus, not intractable 12/04/2014   Localization-related (focal) (partial) idiopathic epilepsy and epileptic syndromes with seizures of localized onset, not intractable, without status epilepticus ([place]) 12/04/2014   Hx of adenomatous colonic polyps 07/16/2014   Benign neoplasm of ascending colon 07/16/2014   Benign neoplasm of descending colon 07/16/2014   Benign neoplasm of cecum 07/16/2014   Benign neoplasm of transverse colon 07/16/2014   Fibromyalgia 05/30/2013   Generalized convulsive epilepsy ([place]) 10/20/2012   Seasonal allergies 12/08/2011   DM (diabetes mellitus), type 2 with peripheral vascular complications ([place]) 65/68/1275   Essential hypertension 01/05/2010   Irritable bowel syndrome 01/05/2010   VERTIGO 09/16/2009   Diarrhea 09/16/2009   Vitamin D deficiency 02/24/2009   MYOCARDIAL INFARCTION, ACUTE, SUBENDOCARDIAL 10/04/2008   Hypothyroidism 03/19/2008   COLONIC POLYPS, ADENOMATOUS, HX OF 08/07/2007   HEMANGIOMA 04/26/2007    Dyslipidemia 04/26/2007   OBESITY 04/26/2007   Depression 04/26/2007   SLEEP APNEA, OBSTRUCTIVE 04/26/2007   HIATAL HERNIA 04/26/2007   FATTY LIVER DISEASE 04/26/2007    Immunization History  Administered Date(s) Administered   Fluad Quad(high Dose 65+) 12/22/2018   Influenza Split 10/26/2011   Influenza, High Dose Seasonal PF 11/15/2016, 09/27/2017   Influenza,inj,Quad PF,6+ Mos 10/25/2012, 11/25/2015   Influenza-Unspecified 10/19/2014   PFIZER(Purple Top)SARS-COV-2 Vaccination 04/27/2019, 05/25/2019   Pneumococcal Conjugate-13 05/24/2016   Pneumococcal Polysaccharide-23 06/27/2017   Td 10/04/2008    Conditions to be addressed/monitored:  Hypertension, Hyperlipidemia, Diabetes, and Coronary Artery Disease, Migraine  Care Plan : [place]  Updates made by Charlton Haws, [place] since 09/04/2020 12:00 AM     Problem: Hypertension, Hyperlipidemia, Diabetes, and Coronary Artery Disease, Migraine   Priority: High     Long-Range Goal: Disease management   Start Date: 03/07/2020  Expected End Date: 03/07/2021  This Visit's Progress: On track  Recent Progress: On track  Priority: High  Note:   Current Barriers:  Unable to independently monitor therapeutic efficacy  Pharmacist Clinical Goal(s):  Patient will achieve adherence to monitoring guidelines and medication adherence to achieve therapeutic efficacy through collaboration with PharmD and provider.   Interventions: 1:1 collaboration with Binnie Rail, MD regarding development and update of comprehensive plan of care as evidenced by provider attestation and co-signature Inter-disciplinary care team collaboration (see longitudinal plan of care) Comprehensive medication review performed; medication list updated in electronic medical record  Hypertension (BP goal < 130/80) Controlled - BP is at goal in recent office visits (elevated on day of colonscopy only); pt endorses compliance and denies side  effects Current regimen:  Losartan 50 mg daily Interventions: Discussed BP goals and benefits of medications for prevention of heart attack / stroke Recommend to continue current medication   Hyperlipidemia / CAD (LDL goal < 70) Controlled - LDL is close to goal (LDL 73); pt endorses compliance with statin and denies side effects Current regimen:  Simvastatin 20 mg daily Interventions: Discussed cholesterol goals and benefits of medications for prevention of heart attack / stroke Recommend to continue current medication   Diabetes (A1c goal < 7%) Controlled  - A1c is at goal; glipizide was reduced to once daily when A1c was 5.7%; pt denies hypoglycemia Fasting BG: 100-110, 135 today Current regimen:  Glipizide 10 mg daily Interventions: Counseled on hypoglycemia prevention and treatment Recommend to continue current medication  Migraine (Goal: reduce frequency) -Uncontrolled - pt reports she has ~4 migraines per month; she did not pick up Aimovig because the PA took 2 months for approval and she did not see the point in picking it up after that -Current treatment  Divalproex 500 mg BID Topiramate 200 mg BID Aimovig - not started Tylenol #3 PRN -Counseled on Aimovig indication and benefits; explained it can prevent migraines when taken regularly -Advised pt to pick up Aimovig from pharmacy and contact CCM team if copay is still prohibitive  Health Maintenance -Vaccine gaps: Shingrix, covid booster -Pt reports she got one COVID booster at [facility] in Fall 2021. She did not get 4th dose -Counseled to get Shingrix vaccine at local pharmacy -Pt completed 3 months of Vitamin D replacement dose and is now taking OTC dose -Current therapy:  Vitamin D 1000 IU daily -Recommended to continue current medication  Patient Goals/Self-Care Activities Patient will:  - take medications as prescribed focus on medication adherence by pill box check glucose daily, document, and provide at  future appointments  -Pick up [place] from pharmacy and take as directed. Contact pharmacist if copay is too high -Get Shingrix vaccine at local pharmacy       Medication Assistance: None required.  Patient affirms current coverage meets needs.  Compliance/Adherence/Medication fill history: Care Gaps: Foot exam (due 11/17/18) Covid booster - [facility]  Star-Rating Drugs: (no surescripts data available) Losartan Simvastatin Glipizide  Patient's preferred pharmacy is:  [facility]  [facility] [place], [place] - [address] [place] [address] Phone: [phone] Fax: [phone]  Uses pill box? Yes Pt endorses 100% compliance  We discussed: Current pharmacy is preferred with insurance plan and patient is satisfied with pharmacy services Patient decided to: Continue current medication management strategy  Care Plan and Follow Up Patient Decision:  Patient agrees to Care Plan and Follow-up.  Plan: Telephone follow up appointment with care management team member scheduled for:  6 months  Charlene Brooke, PharmD, [place], CPP Clinical Pharmacist [facility] [phone]

## 2020-09-04 NOTE — Telephone Encounter (Signed)
Patient called to report Aimovig cost > $500 after prior authorization approval. Likely due to high deductible or donut hole.  Per [facility] formulary Aimovig and Emgality are both Tier 4.   Patient reports she is still having ~4 migraines per month. Patient is interested in pursuing patient assistance. It is easier to get approval for Emgality: Aimovig [employer]) pt assistance program requires proof of income < [phone] and assets <$14,960, documentation of maximum number of insurance appeals before approval, and sometimes is denied on basis of patient having insurance at all. Emgality [employer]) pt assistance requires only proof of income <$54,360 annually.  Will consult with neurologist regarding CGRP access issues.   Charlene Brooke, PharmD, [place], CPP Clinical Pharmacist [facility] [phone]

## 2020-09-05 ENCOUNTER — Other Ambulatory Visit: Payer: Self-pay | Admitting: Neurology

## 2020-09-05 MED ORDER — EMGALITY 120 MG/ML ~~LOC~~ SOAJ: 120.0000 mg | SUBCUTANEOUS | 1 refills | Status: DC

## 2020-09-05 MED ORDER — EMGALITY 120 MG/ML ~~LOC~~ SOAJ: 240.0000 mg | Freq: Once | SUBCUTANEOUS | 0 refills | Status: AC

## 2020-09-06 ENCOUNTER — Other Ambulatory Visit: Payer: Self-pay | Admitting: Internal Medicine

## 2020-09-09 ENCOUNTER — Telehealth: Payer: Self-pay

## 2020-09-09 NOTE — Telephone Encounter (Signed)
New message    Pending   Key: [facility] - PA Case ID: [id number] - Rx #: [id number] Need help?   Status  Sent to Plan today

## 2020-09-09 NOTE — Telephone Encounter (Signed)
Patient has [employer] Part D insurance and reports copay for Emgality is cost prohibitive at this time.  Reviewed application process for [employer] patient assistance program. Patient meets income/out of pocket spend criteria for the program. Patient will provide proof of income, out of pocket spend report, and will sign application. Will collaborate with prescriber Dr [name] for the provider portion of application. Once completed, application will be submitted via [employer] application was mailed to patient. She will gather proof of income and bring application to Dr Aquino's office.  Charlton Haws, [facility]

## 2020-09-10 NOTE — Telephone Encounter (Signed)
Letter received from [place], [place] Approved 08/2020-12/10/20

## 2020-09-11 ENCOUNTER — Other Ambulatory Visit: Payer: Self-pay | Admitting: Internal Medicine

## 2020-09-18 ENCOUNTER — Telehealth: Payer: Self-pay | Admitting: Internal Medicine

## 2020-09-18 DIAGNOSIS — Z20822 Contact with and (suspected) exposure to covid-19: Secondary | ICD-10-CM | POA: Diagnosis not present

## 2020-09-19 MED ORDER — SERTRALINE HCL 100 MG PO TABS: 150.0000 mg | ORAL_TABLET | Freq: Every day | ORAL | 1 refills | Status: DC

## 2020-09-19 NOTE — Telephone Encounter (Signed)
Patient says last time she seen provider in office provider increased sertraline (ZOLOFT) 100 MG tablet to 1 1/2 tablet daily instead of the original 1 tablet daily  Please send new prescription w/ directions for 1 1/2 tablet daily to pharmacy: [place], [place]  Phone:  [phone] Fax:  [phone]

## 2020-09-19 NOTE — Telephone Encounter (Signed)
Pls advise.. per last ov it just states to continue sertraline 100 mg../lmb

## 2020-09-19 NOTE — Telephone Encounter (Signed)
Refilled simvastatin and decline sertraline, it was refilled 09/11/20 for 90 days.

## 2020-09-19 NOTE — Addendum Note (Signed)
Addended by: Binnie Rail on: 09/19/2020 12:08 PM   Modules accepted: Orders

## 2020-10-01 ENCOUNTER — Other Ambulatory Visit: Payer: Self-pay | Admitting: Internal Medicine

## 2020-10-07 ENCOUNTER — Other Ambulatory Visit: Payer: Self-pay | Admitting: Internal Medicine

## 2020-10-13 ENCOUNTER — Other Ambulatory Visit: Payer: Self-pay

## 2020-10-13 ENCOUNTER — Encounter: Payer: Self-pay | Admitting: Neurology

## 2020-10-13 ENCOUNTER — Telehealth (INDEPENDENT_AMBULATORY_CARE_PROVIDER_SITE_OTHER): Payer: Medicare Other | Admitting: Neurology

## 2020-10-13 VITALS — Ht 63.0 in | Wt 283.0 lb

## 2020-10-13 DIAGNOSIS — G40309 Generalized idiopathic epilepsy and epileptic syndromes, not intractable, without status epilepticus: Secondary | ICD-10-CM

## 2020-10-13 DIAGNOSIS — R251 Tremor, unspecified: Secondary | ICD-10-CM | POA: Diagnosis not present

## 2020-10-13 DIAGNOSIS — G43109 Migraine with aura, not intractable, without status migrainosus: Secondary | ICD-10-CM

## 2020-10-13 MED ORDER — TOPIRAMATE 200 MG PO TABS: ORAL_TABLET | ORAL | 3 refills | Status: DC

## 2020-10-13 MED ORDER — DIVALPROEX SODIUM 250 MG PO DR TAB: 250.0000 mg | DELAYED_RELEASE_TABLET | Freq: Two times a day (BID) | ORAL | 3 refills | Status: DC

## 2020-10-13 NOTE — Progress Notes (Signed)
Telephone (Audio) Visit The purpose of this telephone visit is to provide medical care while limiting exposure to the novel coronavirus.    Consent was obtained for telephone visit:  Yes.   Answered questions that patient had about telehealth interaction:  Yes.   I discussed the limitations, risks, security and privacy concerns of performing an evaluation and management service by telephone. I also discussed with the patient that there may be a patient responsible charge related to this service. The patient expressed understanding and agreed to proceed.  Pt location: Home Physician Location: office Name of referring provider:  Binnie Rail, MD I connected with .Alexis Grant at patients initiation/request on 10/13/2020 at  2:30 PM EDT by telephone and verified that I am speaking with the correct person using two identifiers.  Pt MRN:  [id number] Pt DOB:  09/17/1950   History of Present Illness:  The patient had a telephone visit on 10/13/2020. Again unable to connect via video due to technical difficulties (no audio). She was advised to come to the office for in-person visit on next appointment. She reports worsening migraines occurring at least once a week now, they are much worse and more often. She takes prn Excedrin migraine which makes her sleepy. The sleep knocks the edge off a little.  It took a while to get the CGRP inhibitors, she now has it through a program for [employer] and took her first dose last month. She continues on Depakote 500mg  BID and Topiramate 200mg  BID. She has been seizure-free since 2002. The tremors are real bad, especially when she tries to take her medications in the morning. Her balance has worsened, she has had a few falls with no injuries, last fall was 2-3 months ago.   History on Initial Assessment 11/28/2014: This is a pleasant 70 yo RH woman with a history of hypertension, hyperlipidemia, diabetes, obesity, sleep apnea, CAD s/p MI, with seizures and  migraines. She reports seizures started at age 13, she woke up in the hospital with no prior warning symptoms. Since then, she has had 4 generalized tonic-clonic seizures, last was in 2002. She also report 5 or 6 "little episodes" where she feels dizzy, with blurred vision, then if she sits down quickly and tries to relax, she can avoid any progression. She would feel briefly confused. No associated olfactory/gustatory hallucinations, focal numbness/tingling/weakness, myoclonic jerks. The last time she had these episodes was at least 12 years ago. She recalls taking Neurontin initially, which did not help. Topamax in the past which caused GI symptoms. She has been taking Depakote ER 500mg  in AM, 1000mg  in PM for many years now with no side effects. She reports having brain imaging and EEG in the past which were normal, no records available for review.    She reports migraines started at age 3. She has an aura of numbness in her face, cheeks, and temple, then she becomes very sensitive to lights, sounds, and smells. She sees flashing lights then her vision becomes blurred, followed by throbbing headaches in the frontal or occipital regions with associated nausea. No associated focal numbness/tingling/weakness in the extremities. She tried Imitrex with minimal effect. A course of Prednisone   She has chronic neck pain and has been dealing with diarrhea from IBS. She had an MI in 2010.    Epilepsy Risk Factors:  There is a strong family history of seizures in her paternal grandfather, paternal aunt, nephew. Otherwise she had a normal birth and early development.  There is no history of febrile convulsions, CNS infections such as meningitis/encephalitis, significant traumatic brain injury, neurosurgical procedures.     Observations/Objective:   Vitals:   10/13/20 1258  Weight: 283 lb (128.4 kg)  Height: 5\' 3"  (1.6 m)   Patient was briefly seen on video however there was no audio. Face was symmetric,  moving all extremities at least anti-gravity. There was no aphasia, dysarthria.   Assessment and Plan:   This is a pleasant 70 yo RH woman with a history of hypertension, hyperlipidemia, obesity, sleep apnea on CPAP, CAD s/p MI, with a history of convulsive seizures, well-controlled on Depakote with no seizures since 2002. She is reporting worsening tremors and migraines. She had been on Depakote for seizures and migraines, discussed reducing Depakote to 250mg  BID. Continue Topiramate 200mg  BID. Continue with Emgality. We hope to completely wean her off Depakote, and if no improvement in tremors, she is interested in evaluation by our Movement Disorders specialist Dr. Carles Collet for DBS for essential tremor. She does not drive. Follow-up in 4-5 months, call for any changes.   Follow Up Instructions:   -I discussed the assessment and treatment plan with the patient. The patient was provided an opportunity to ask questions and all were answered. The patient agreed with the plan and demonstrated an understanding of the instructions.   The patient was advised to call back or seek an in-person evaluation if the symptoms worsen or if the condition fails to improve as anticipated.    Total Time spent in visit with the patient was:  12 minutes, of which 100% of the time was spent in counseling and/or coordinating care on the above.   Pt understands and agrees with the plan of care outlined.     Cameron Sprang, MD

## 2020-10-13 NOTE — Patient Instructions (Signed)
Reduce Depakote to 250mg  twice a day  2. Continue Topiramate 200mg  twice a day  3. Continue Emgality every month  4. Follow-up in 4-5 months, call for any changes

## 2020-10-18 DIAGNOSIS — Z20822 Contact with and (suspected) exposure to covid-19: Secondary | ICD-10-CM | POA: Diagnosis not present

## 2020-10-20 ENCOUNTER — Ambulatory Visit: Payer: Medicare Other | Admitting: Internal Medicine

## 2020-10-24 ENCOUNTER — Other Ambulatory Visit: Payer: Self-pay | Admitting: Internal Medicine

## 2020-10-28 ENCOUNTER — Other Ambulatory Visit: Payer: Self-pay | Admitting: Internal Medicine

## 2020-10-28 DIAGNOSIS — E1151 Type 2 diabetes mellitus with diabetic peripheral angiopathy without gangrene: Secondary | ICD-10-CM

## 2020-11-08 ENCOUNTER — Other Ambulatory Visit: Payer: Self-pay | Admitting: Internal Medicine

## 2020-11-10 ENCOUNTER — Telehealth: Payer: Self-pay | Admitting: Internal Medicine
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
## 2020-11-10 NOTE — Telephone Encounter (Signed)
Pain med refilled but she needs to be seen twice a year and is due for a f/u this month - pls call her to schedule an appt.

## 2020-11-18 DIAGNOSIS — Z20822 Contact with and (suspected) exposure to covid-19: Secondary | ICD-10-CM | POA: Diagnosis not present

## 2020-11-27 ENCOUNTER — Ambulatory Visit: Payer: Medicare Other

## 2020-12-09 ENCOUNTER — Other Ambulatory Visit: Payer: Self-pay | Admitting: Internal Medicine

## 2020-12-11 ENCOUNTER — Other Ambulatory Visit: Payer: Self-pay | Admitting: Internal Medicine

## 2020-12-18 ENCOUNTER — Telehealth: Payer: Self-pay

## 2020-12-18 NOTE — Progress Notes (Signed)
    Chronic Care Management Pharmacy Assistant   Name: Alexis Grant  MRN: [id number] DOB: 23-Feb-1950  Reason for Encounter: Disease State-General Call    Recent office visits:  None ID  Recent consult visits:  None ID  Hospital visits:  None in previous 6 months  Medications: Outpatient Encounter Medications as of 12/18/2020  Medication Sig   ACCU-CHEK GUIDE test strip USE UP TO FOUR TIMES DAILY AS DIRECTED   acetaminophen (TYLENOL) 500 MG tablet Take 1 tablet (500 mg total) by mouth every 6 (six) hours as needed.   acetaminophen-codeine (TYLENOL #3) 300-30 MG tablet TAKE 2 TABLETS BY MOUTH TWICE DAILY AS NEEDED FOR MODERATE PAIN   albuterol (VENTOLIN HFA) 108 (90 Base) MCG/ACT inhaler Inhale 2 puffs into the lungs every 6 (six) hours as needed for wheezing or shortness of breath.   blood glucose meter kit and supplies KIT Dispense based on patient and insurance preference. Use up to four times daily as directed. (FOR E11.9).   cholecalciferol (VITAMIN D3) 25 MCG (1000 UNIT) tablet Take 1,000 Units by mouth daily.   divalproex (DEPAKOTE) 250 MG DR tablet Take 1 tablet (250 mg total) by mouth 2 (two) times daily.   Galcanezumab-gnlm (EMGALITY) 120 MG/ML SOAJ Inject 120 mg into the skin every 28 (twenty-eight) days.   glipiZIDE (GLUCOTROL) 10 MG tablet TAKE 1 TABLET BY MOUTH TWICE DAILY BEFORE A MEAL   levothyroxine (SYNTHROID) 75 MCG tablet TAKE 1 TABLET BY MOUTH ONCE DAILY BEFORE BREAKFAST   loperamide (IMODIUM) 2 MG capsule Take 1 capsule (2 mg total) by mouth 4 (four) times daily as needed for diarrhea or loose stools.   losartan (COZAAR) 50 MG tablet Take 1 tablet by mouth once daily   ondansetron (ZOFRAN-ODT) 4 MG disintegrating tablet Take 1 tablet (4 mg total) by mouth every 8 (eight) hours as needed for nausea or vomiting.   sertraline (ZOLOFT) 100 MG tablet Take 1.5 tablets (150 mg total) by mouth daily.   simvastatin (ZOCOR) 20 MG tablet TAKE 1 TABLET BY MOUTH AT BEDTIME    topiramate (TOPAMAX) 200 MG tablet TAKE 1 TABLET BY MOUTH TWICE DAILY   traZODone (DESYREL) 50 MG tablet TAKE 2 TABLETS BY MOUTH AT BEDTIME FOR SLEEP   No facility-administered encounter medications on file as of 12/18/2020.   Reviewed chart for medication changes and drug therapy problems ahead of medication adherence call.  Attempted to contact patient x 3 for medication review and health check, unable to reach patient, left voicemails to return call.    Care Gaps: Colonoscopy-07/07/20 Diabetic Foot Exam-11/16/17 Mammogram-11/29/17 Ophthalmology-11/14/19 Dexa Scan - 11/17/16 Annual Well Visit - NA Micro albumin-NA Hemoglobin A1c- 04/18/20  Star Rating Drugs: Simvastatin 20 mg-last fill 9/2//22 90 ds Losartan 50 mg-last fill 10/28/20 90 ds  Ethelene Hal Clinical Pharmacist Assistant [phone]

## 2020-12-26 ENCOUNTER — Other Ambulatory Visit: Payer: Self-pay | Admitting: Internal Medicine

## 2021-01-08 ENCOUNTER — Other Ambulatory Visit: Payer: Self-pay | Admitting: Internal Medicine

## 2021-01-20 ENCOUNTER — Ambulatory Visit: Payer: Medicare Other

## 2021-01-21 ENCOUNTER — Telehealth: Payer: Self-pay

## 2021-01-21 NOTE — Progress Notes (Signed)
° ° °  Chronic Care Management Pharmacy Assistant   Name: TANAISHA PITTMAN  MRN: [id number] DOB: 1950-07-18   Reason for Encounter: Disease State-General      Recent office visits:  None ID  Recent consult visits:  None ID  Hospital visits:  None in previous 6 months  Medications: Outpatient Encounter Medications as of 01/21/2021  Medication Sig   ACCU-CHEK GUIDE test strip USE UP TO FOUR TIMES DAILY AS DIRECTED   acetaminophen (TYLENOL) 500 MG tablet Take 1 tablet (500 mg total) by mouth every 6 (six) hours as needed.   acetaminophen-codeine (TYLENOL #3) 300-30 MG tablet TAKE 2 TABLETS BY MOUTH TWICE DAILY AS NEEDED FOR MODERATE PAIN   albuterol (VENTOLIN HFA) 108 (90 Base) MCG/ACT inhaler Inhale 2 puffs into the lungs every 6 (six) hours as needed for wheezing or shortness of breath.   blood glucose meter kit and supplies KIT Dispense based on patient and insurance preference. Use up to four times daily as directed. (FOR E11.9).   cholecalciferol (VITAMIN D3) 25 MCG (1000 UNIT) tablet Take 1,000 Units by mouth daily.   divalproex (DEPAKOTE) 250 MG DR tablet Take 1 tablet (250 mg total) by mouth 2 (two) times daily.   Galcanezumab-gnlm (EMGALITY) 120 MG/ML SOAJ Inject 120 mg into the skin every 28 (twenty-eight) days.   glipiZIDE (GLUCOTROL) 10 MG tablet TAKE 1 TABLET BY MOUTH TWICE DAILY BEFORE A MEAL   levothyroxine (SYNTHROID) 75 MCG tablet TAKE 1 TABLET BY MOUTH ONCE DAILY BEFORE BREAKFAST   loperamide (IMODIUM) 2 MG capsule Take 1 capsule (2 mg total) by mouth 4 (four) times daily as needed for diarrhea or loose stools.   losartan (COZAAR) 50 MG tablet Take 1 tablet by mouth once daily   ondansetron (ZOFRAN-ODT) 4 MG disintegrating tablet Take 1 tablet (4 mg total) by mouth every 8 (eight) hours as needed for nausea or vomiting.   sertraline (ZOLOFT) 100 MG tablet Take 1.5 tablets (150 mg total) by mouth daily.   simvastatin (ZOCOR) 20 MG tablet TAKE 1 TABLET BY MOUTH AT BEDTIME    topiramate (TOPAMAX) 200 MG tablet TAKE 1 TABLET BY MOUTH TWICE DAILY   traZODone (DESYREL) 50 MG tablet TAKE 2 TABLETS BY MOUTH AT BEDTIME FOR SLEEP   No facility-administered encounter medications on file as of 01/21/2021.   Reviewed chart for medication changes and drug therapy problems ahead of medication adherence call.  Attempted to contact patient x 3 for medication review and health check, unable to reach patient, left voicemails to return call.    Care Gaps: Colonoscopy-07/07/20 Diabetic Foot Exam-11/16/17 Mammogram-11/29/17 Ophthalmology-11/14/19 Dexa Scan - 11/17/16 Annual Well Visit - NA Micro albumin-NA Hemoglobin A1c- 04/18/20  Star Rating Drugs: Simvastatin 20 mg-last fill 9/2//22 90 ds Losartan 50 mg-last fill 10/28/20 90 ds  Ethelene Hal Clinical Pharmacist Assistant [phone]

## 2021-01-25 ENCOUNTER — Encounter: Payer: Self-pay | Admitting: Internal Medicine

## 2021-01-25 NOTE — Patient Instructions (Signed)
  Blood work was ordered.     Medications changes include :     Your prescription(s) have been submitted to your pharmacy. Please take as directed and contact our office if you believe you are having problem(s) with the medication(s).   A referral was ordered for        Someone from their office will call you to schedule an appointment.    Please followup in 6 months  

## 2021-01-25 NOTE — Progress Notes (Signed)
Subjective:    Patient ID: Alexis Grant, female    DOB: May 27, 1950, 71 y.o.   MRN: [id number]  This visit occurred during the SARS-CoV-2 public health emergency.  Safety protocols were in place, including screening questions prior to the visit, additional usage of staff PPE, and extensive cleaning of exam room while observing appropriate contact time as indicated for disinfecting solutions.     HPI The patient is here for follow up of their chronic medical problems, including severe OA in hips, htn, DM, hld, hypothyroid, depression, insomnia, overactive bladder    Medications and allergies reviewed with patient and updated if appropriate.  Patient Active Problem List   Diagnosis Date Noted   Grieving 02/04/2021   COVID-19 virus infection 02/05/2020   Overactive bladder 11/23/2018   Frequent UTI 05/03/2018   Lump of skin 12/30/2017   Watery eyes 12/30/2017   Polyneuropathy 09/27/2017   Osteoarthritis, hip, bilateral 05/25/2017   Osteopenia 11/22/2016   Difficulty sleeping 05/24/2016   Rosacea 11/25/2015   Poor balance 11/25/2015   Tremor 04/18/2015   Other fatigue 02/17/2015   Migraine with aura and without status migrainosus, not intractable 12/04/2014   Localization-related (focal) (partial) idiopathic epilepsy and epileptic syndromes with seizures of localized onset, not intractable, without status epilepticus ([place]) 12/04/2014   Hx of adenomatous colonic polyps 07/16/2014   Benign neoplasm of ascending colon 07/16/2014   Benign neoplasm of descending colon 07/16/2014   Benign neoplasm of cecum 07/16/2014   Benign neoplasm of transverse colon 07/16/2014   Fibromyalgia 05/30/2013   Generalized convulsive epilepsy ([place]) 10/20/2012   Seasonal allergies 12/08/2011   DM (diabetes mellitus), type 2 with peripheral vascular complications ([place]) 11/57/2620   Essential hypertension 01/05/2010   Irritable bowel syndrome 01/05/2010   VERTIGO  09/16/2009   Diarrhea 09/16/2009   Vitamin D deficiency 02/24/2009   MYOCARDIAL INFARCTION, ACUTE, SUBENDOCARDIAL 10/04/2008   Hypothyroidism 03/19/2008   COLONIC POLYPS, ADENOMATOUS, HX OF 08/07/2007   HEMANGIOMA 04/26/2007   Dyslipidemia 04/26/2007   OBESITY 04/26/2007   Depression 04/26/2007   SLEEP APNEA, OBSTRUCTIVE 04/26/2007   HIATAL HERNIA 04/26/2007   FATTY LIVER DISEASE 04/26/2007    Current Outpatient Medications on File Prior to Visit  Medication Sig Dispense Refill   ACCU-CHEK GUIDE test strip USE UP TO FOUR TIMES DAILY AS DIRECTED 100 each 0   acetaminophen (TYLENOL) 500 MG tablet Take 1 tablet (500 mg total) by mouth every 6 (six) hours as needed. 30 tablet 3   albuterol (VENTOLIN HFA) 108 (90 Base) MCG/ACT inhaler Inhale 2 puffs into the lungs every 6 (six) hours as needed for wheezing or shortness of breath. 8 g 5   blood glucose meter kit and supplies KIT Dispense based on patient and insurance preference. Use up to four times daily as directed. (FOR E11.9). 1 each 0   cholecalciferol (VITAMIN D3) 25 MCG (1000 UNIT) tablet Take 1,000 Units by mouth daily.     divalproex (DEPAKOTE) 250 MG DR tablet Take 1 tablet (250 mg total) by mouth 2 (two) times daily. 180 tablet 3   Galcanezumab-gnlm (EMGALITY) 120 MG/ML SOAJ Inject 120 mg into the skin every 28 (twenty-eight) days. 1.12 mL 1   glipiZIDE (GLUCOTROL) 10 MG tablet TAKE 1 TABLET BY MOUTH TWICE DAILY BEFORE A MEAL 180 tablet 0   loperamide (IMODIUM) 2 MG capsule Take 1 capsule (2 mg total) by mouth 4 (four) times daily as needed for diarrhea or loose stools. 12 capsule 0   ondansetron (  ZOFRAN-ODT) 4 MG disintegrating tablet Take 1 tablet (4 mg total) by mouth every 8 (eight) hours as needed for nausea or vomiting. 20 tablet 0   sertraline (ZOLOFT) 100 MG tablet Take 1.5 tablets (150 mg total) by mouth daily. 135 tablet 1   simvastatin (ZOCOR) 20 MG tablet TAKE 1 TABLET BY MOUTH AT BEDTIME 90  tablet 0   topiramate (TOPAMAX) 200 MG tablet TAKE 1 TABLET BY MOUTH TWICE DAILY 180 tablet 3   No current facility-administered medications on file prior to visit.    Past Medical History:  Diagnosis Date   Adenomatous polyp of colon 2008   Allergy    Anemia    Anxiety    Arthritis    Blood transfusion without reported diagnosis    following knee surgery    Cataract    hx- removed both eyes   Depression    Diabetes mellitus    type II    Dyslipidemia    Fibromyalgia    Gallstones    Gastritis    GERD (gastroesophageal reflux disease)    Hemangioma    Hiatal hernia    Fibromyalgia   Hyperlipidemia    Hypertension    Hypothyroidism 2010   Dr.[place]r   IBS (irritable bowel syndrome)    Dr Fuller Plan   Migraines    Myocardial infarct [facility] 2010   subendocardrial, following R TKR   Obesity    OSA (obstructive sleep apnea)    cpap - does not know settings    Pneumonia    Seizure disorder ([place])    Seizures ([place])    last seizure was 2002 per pt 07-29-16   Sleep apnea    Urinary tract infection     Past Surgical History:  Procedure Laterality Date   [place]  2007   DrGamble ( now seeing Dr Tonita Cong cardiology)   [place]   COLONOSCOPY     COLONOSCOPY WITH PROPOFOL N/A 07/16/2014   Procedure: COLONOSCOPY WITH PROPOFOL;  Surgeon: Ladene Artist, MD;  Location: [facility] END[facility];  Service: Endoscopy;  Laterality: N/A;   COLONOSCOPY WITH PROPOFOL N/A 09/21/2016   Procedure: COLONOSCOPY WITH PROPOFOL;  Surgeon: Ladene Artist, MD;  Location: [facility] ENDOS[facility];  Service: Endoscopy;  Laterality: N/A;   POLYPECTOMY  2012   6 or 7 adenomatous, Dr.Stark   steroid injection to left si joint  12/2008   Dr.Newton    TOTAL KNEE ARTHROPLASTY Right 11/2005   TOTAL KNEE ARTHROPLASTY Left    TUBAL LIGATION  1986    Social History   Socioeconomic History   Marital status: Widowed     Spouse name: Not on file   Number of children: 3   Years of education: college   Highest education level: Not on file  Occupational History   Occupation: Retired  Tobacco Use   Smoking status: Never   Smokeless tobacco: Never  Vaping Use   Vaping Use: Never used  Substance and Sexual Activity   Alcohol use: No    Alcohol/week: 0.0 standard drinks   Drug use: No   Sexual activity: Not Currently  Other Topics Concern   Not on file  Social History Narrative   Illicit drug use- no   Patient does not get regular exercise due to knee.   Patient lives at home alone.    Patient son and wife lives next door.   Patient has 3 children.    Patient has some college.  Patient retired May 2014.    Social Determinants of Health   Financial Resource Strain: Low Risk    Difficulty of Paying Living Expenses: Not hard at all  Food Insecurity: No Food Insecurity   Worried About Charity fundraiser in the Last Year: Never true   [place] in the Last Year: Never true  Transportation Needs: No Transportation Needs   Lack of Transportation (Medical): No   Lack of Transportation (Non-Medical): No  Physical Activity: Inactive   Days of Exercise per Week: 0 days   Minutes of Exercise per Session: 0 min  Stress: Stress Concern Present   Feeling of Stress : Rather much  Social Connections: Socially Isolated   Frequency of Communication with Friends and Family: More than three times a week   Frequency of Social Gatherings with Friends and Family: Once a week   Attends Religious Services: Never   Marine scientist or Organizations: No   Attends Archivist Meetings: Never   Marital Status: Widowed    Family History  Problem Relation Age of Onset   Colon polyps Mother    Ulcers Father    Colon polyps Brother    Esophageal cancer Brother    Diabetes Sister    Kidney failure Sister    Diabetes Paternal Aunt    Esophageal cancer Brother     Kidney cancer Sister    Renal cancer Sister    Colon cancer Neg Hx    Heart attack Neg Hx    Stroke Neg Hx    Cancer Neg Hx    Gallbladder disease Neg Hx    Rectal cancer Neg Hx    Stomach cancer Neg Hx     Review of Systems     Objective:  There were no vitals filed for this visit. BP Readings from Last 3 Encounters:  02/04/21 116/78  07/07/20 (!) 145/63  04/18/20 130/72   Wt Readings from Last 3 Encounters:  02/04/21 (!) 310 lb 3.2 oz (140.7 kg)  10/13/20 283 lb (128.4 kg)  07/07/20 281 lb (127.5 kg)   There is no height or weight on file to calculate BMI.   Physical Exam    Constitutional: Appears well-developed and well-nourished. No distress.  HENT:  Head: Normocephalic and atraumatic.  Neck: Neck supple. No tracheal deviation present. No thyromegaly present.  No cervical lymphadenopathy Cardiovascular: Normal rate, regular rhythm and normal heart sounds.   No murmur heard. No carotid bruit .  No edema Pulmonary/Chest: Effort normal and breath sounds normal. No respiratory distress. No has no wheezes. No rales.  Skin: Skin is warm and dry. Not diaphoretic.  Psychiatric: Normal mood and affect. Behavior is normal.      Assessment & Plan:    See Problem List for Assessment and Plan of chronic medical problems.     This encounter was created in error - please disregard.

## 2021-01-26 ENCOUNTER — Encounter: Payer: Self-pay | Admitting: Internal Medicine

## 2021-01-26 DIAGNOSIS — E785 Hyperlipidemia, unspecified: Secondary | ICD-10-CM

## 2021-01-26 DIAGNOSIS — E1151 Type 2 diabetes mellitus with diabetic peripheral angiopathy without gangrene: Secondary | ICD-10-CM

## 2021-01-26 DIAGNOSIS — F3289 Other specified depressive episodes: Secondary | ICD-10-CM

## 2021-01-26 DIAGNOSIS — I10 Essential (primary) hypertension: Secondary | ICD-10-CM

## 2021-01-26 DIAGNOSIS — G479 Sleep disorder, unspecified: Secondary | ICD-10-CM

## 2021-01-26 DIAGNOSIS — E039 Hypothyroidism, unspecified: Secondary | ICD-10-CM

## 2021-01-26 DIAGNOSIS — M160 Bilateral primary osteoarthritis of hip: Secondary | ICD-10-CM

## 2021-01-26 DIAGNOSIS — N3281 Overactive bladder: Secondary | ICD-10-CM

## 2021-01-26 DIAGNOSIS — E559 Vitamin D deficiency, unspecified: Secondary | ICD-10-CM

## 2021-01-27 ENCOUNTER — Ambulatory Visit (INDEPENDENT_AMBULATORY_CARE_PROVIDER_SITE_OTHER): Payer: Medicare Other

## 2021-01-27 ENCOUNTER — Other Ambulatory Visit: Payer: Self-pay

## 2021-01-27 DIAGNOSIS — Z780 Asymptomatic menopausal state: Secondary | ICD-10-CM

## 2021-01-27 DIAGNOSIS — M8580 Other specified disorders of bone density and structure, unspecified site: Secondary | ICD-10-CM

## 2021-01-27 DIAGNOSIS — Z13820 Encounter for screening for osteoporosis: Secondary | ICD-10-CM

## 2021-01-27 DIAGNOSIS — Z0000 Encounter for general adult medical examination without abnormal findings: Secondary | ICD-10-CM

## 2021-01-27 DIAGNOSIS — Z1239 Encounter for other screening for malignant neoplasm of breast: Secondary | ICD-10-CM

## 2021-01-27 NOTE — Progress Notes (Signed)
I connected with Alexis Grant today by telephone and verified that I am speaking with the correct person using two identifiers. Location patient: home Location provider: work Persons participating in the virtual visit: patient, provider.   I discussed the limitations, risks, security and privacy concerns of performing an evaluation and management service by telephone and the availability of in person appointments. I also discussed with the patient that there may be a patient responsible charge related to this service. The patient expressed understanding and verbally consented to this telephonic visit.    Interactive audio and video telecommunications were attempted between this provider and patient, however failed, due to patient having technical difficulties OR patient did not have access to video capability.  We continued and completed visit with audio only.  Some vital signs may be absent or patient reported.   Time Spent with patient on telephone encounter: 40 minutes  Subjective:   Alexis Grant is a 71 y.o. female who presents for Medicare Annual (Subsequent) preventive examination.  Review of Systems     Cardiac Risk Factors include: advanced age (>6mn, >>10women);diabetes mellitus;dyslipidemia;hypertension     Objective:    Today's Vitals   01/27/21 1400  PainSc: 8    There is no height or weight on file to calculate BMI.  Advanced Directives 01/27/2021 10/13/2020 06/17/2020 11/27/2019 10/23/2019 08/11/2019 03/21/2019  Does Patient Have a Medical Advance Directive? Yes Yes Yes No No No No  Type of Advance Directive Living will;Healthcare Power of [place];Out of facility DNR (pink MOST or yellow form) - - - - -  Does patient want to make changes to medical advance directive? No - Patient declined - - - - - -  Copy of [place]? No - copy requested - - - - - -  Would patient like information on creating a  medical advance directive? - - - Yes (MAU/Ambulatory/Procedural Areas - Information given) - No - Patient declined -    Current Medications (verified) Outpatient Encounter Medications as of 01/27/2021  Medication Sig   ACCU-CHEK GUIDE test strip USE UP TO FOUR TIMES DAILY AS DIRECTED   acetaminophen (TYLENOL) 500 MG tablet Take 1 tablet (500 mg total) by mouth every 6 (six) hours as needed.   acetaminophen-codeine (TYLENOL #3) 300-30 MG tablet TAKE 2 TABLETS BY MOUTH TWICE DAILY AS NEEDED FOR MODERATE PAIN   albuterol (VENTOLIN HFA) 108 (90 Base) MCG/ACT inhaler Inhale 2 puffs into the lungs every 6 (six) hours as needed for wheezing or shortness of breath.   blood glucose meter kit and supplies KIT Dispense based on patient and insurance preference. Use up to four times daily as directed. (FOR E11.9).   cholecalciferol (VITAMIN D3) 25 MCG (1000 UNIT) tablet Take 1,000 Units by mouth daily.   divalproex (DEPAKOTE) 250 MG DR tablet Take 1 tablet (250 mg total) by mouth 2 (two) times daily.   Galcanezumab-gnlm (EMGALITY) 120 MG/ML SOAJ Inject 120 mg into the skin every 28 (twenty-eight) days.   glipiZIDE (GLUCOTROL) 10 MG tablet TAKE 1 TABLET BY MOUTH TWICE DAILY BEFORE A MEAL   levothyroxine (SYNTHROID) 75 MCG tablet TAKE 1 TABLET BY MOUTH ONCE DAILY BEFORE BREAKFAST   loperamide (IMODIUM) 2 MG capsule Take 1 capsule (2 mg total) by mouth 4 (four) times daily as needed for diarrhea or loose stools.   losartan (COZAAR) 50 MG tablet Take 1 tablet by mouth once daily   ondansetron (ZOFRAN-ODT) 4 MG disintegrating tablet  Take 1 tablet (4 mg total) by mouth every 8 (eight) hours as needed for nausea or vomiting.   sertraline (ZOLOFT) 100 MG tablet Take 1.5 tablets (150 mg total) by mouth daily.   simvastatin (ZOCOR) 20 MG tablet TAKE 1 TABLET BY MOUTH AT BEDTIME   topiramate (TOPAMAX) 200 MG tablet TAKE 1 TABLET BY MOUTH TWICE DAILY   traZODone (DESYREL) 50 MG tablet TAKE 2 TABLETS BY MOUTH AT BEDTIME  FOR SLEEP   No facility-administered encounter medications on file as of 01/27/2021.    Allergies (verified) Hydrocodone-acetaminophen and Metformin and related   History: Past Medical History:  Diagnosis Date   Adenomatous polyp of colon 2008   Allergy    Anemia    Anxiety    Arthritis    Blood transfusion without reported diagnosis    following knee surgery    Cataract    hx- removed both eyes   Depression    Diabetes mellitus    type II    Dyslipidemia    Fibromyalgia    Gallstones    Gastritis    GERD (gastroesophageal reflux disease)    Hemangioma    Hiatal hernia    Fibromyalgia   Hyperlipidemia    Hypertension    Hypothyroidism 2010   Dr.Deveshawr   IBS (irritable bowel syndrome)    Dr Fuller Plan   Migraines    Myocardial infarct [facility]) 2010   subendocardrial, following R TKR   Obesity    OSA (obstructive sleep apnea)    cpap - does not know settings    Pneumonia    Seizure disorder ([place])    Seizures ([place])    last seizure was 2002 per pt 07-29-16   Sleep apnea    Urinary tract infection    Past Surgical History:  Procedure Laterality Date   [place]  2007   DrGamble ( now seeing Dr Tonita Cong cardiology)   [place]   COLONOSCOPY     COLONOSCOPY WITH PROPOFOL N/A 07/16/2014   Procedure: COLONOSCOPY WITH PROPOFOL;  Surgeon: Ladene Artist, MD;  Location: [facility] END[facility];  Service: Endoscopy;  Laterality: N/A;   COLONOSCOPY WITH PROPOFOL N/A 09/21/2016   Procedure: COLONOSCOPY WITH PROPOFOL;  Surgeon: Ladene Artist, MD;  Location: [facility] END[facility];  Service: Endoscopy;  Laterality: N/A;   POLYPECTOMY  2012   6 or 7 adenomatous, Dr.Stark   steroid injection to left si joint  12/2008   Dr.Newton    TOTAL KNEE ARTHROPLASTY Right 11/2005   TOTAL KNEE ARTHROPLASTY Left    TUBAL LIGATION  1986   Family History  Problem Relation Age of Onset   Colon polyps Mother    Ulcers Father    Colon polyps Brother     Esophageal cancer Brother    Diabetes Sister    Kidney failure Sister    Diabetes Paternal Aunt    Esophageal cancer Brother    Kidney cancer Sister    Renal cancer Sister    Colon cancer Neg Hx    Heart attack Neg Hx    Stroke Neg Hx    Cancer Neg Hx    Gallbladder disease Neg Hx    Rectal cancer Neg Hx    Stomach cancer Neg Hx    Social History   Socioeconomic History   Marital status: Widowed    Spouse name: Not on file   Number of children: 3   Years of education: college   Highest education level:  Not on file  Occupational History   Occupation: Retired  Tobacco Use   Smoking status: Never   Smokeless tobacco: Never  Vaping Use   Vaping Use: Never used  Substance and Sexual Activity   Alcohol use: No    Alcohol/week: 0.0 standard drinks   Drug use: No   Sexual activity: Not Currently  Other Topics Concern   Not on file  Social History Narrative   Illicit drug use- no   Patient does not get regular exercise due to knee.   Patient lives at home alone.    Patient son and wife lives next door.   Patient has 3 children.    Patient has some college.    Patient retired May 2014.    Social Determinants of Health   Financial Resource Strain: Low Risk    Difficulty of Paying Living Expenses: Not hard at all  Food Insecurity: No Food Insecurity   Worried About Charity fundraiser in the Last Year: Never true   [place] in the Last Year: Never true  Transportation Needs: No Transportation Needs   Lack of Transportation (Medical): No   Lack of Transportation (Non-Medical): No  Physical Activity: Inactive   Days of Exercise per Week: 0 days   Minutes of Exercise per Session: 0 min  Stress: Stress Concern Present   Feeling of Stress : Rather much  Social Connections: Socially Isolated   Frequency of Communication with Friends and Family: More than three times a week   Frequency of Social Gatherings with Friends and Family: Once a week   Attends  Religious Services: Never   Marine scientist or Organizations: No   Attends Archivist Meetings: Never   Marital Status: Widowed    Tobacco Counseling Counseling given: Not Answered   Clinical Intake:  Pre-visit preparation completed: Yes  Pain : 0-10 Pain Score: 8  Pain Type: Chronic pain Pain Location: Other (Comment) (all over her body) Pain Descriptors / Indicators: Constant Pain Onset: More than a month ago Pain Frequency: Constant Pain Relieving Factors: Tylenol #3 Effect of Pain on Daily Activities: Pain can diminish job performance, lower motivation to exercise, and prevent you from completing daily tasks. Pain produces disability and affects the quality of life.  Pain Relieving Factors: Tylenol #3  Nutritional Risks: Other (Comment) (loss of appetite) Diabetes: Yes CBG done?: Yes (fasting 154) CBG resulted in Enter/ Edit results?: Yes Did pt. bring in CBG monitor from home?: No  How often do you need to have someone help you when you read instructions, pamphlets, or other written materials from your doctor or pharmacy?: 1 - Never What is the last grade level you completed in school?: Some College  Diabetic? yes  Interpreter Needed?: No  Information entered by :: Lisette Abu, LPN   Activities of Daily Living In your present state of health, do you have any difficulty performing the following activities: 01/27/2021  Hearing? N  Vision? N  Difficulty concentrating or making decisions? N  Walking or climbing stairs? Y  Dressing or bathing? N  Doing errands, shopping? Y  Preparing Food and eating ? N  Using the Toilet? N  In the past six months, have you accidently leaked urine? Y  Do you have problems with loss of bowel control? N  Managing your Medications? N  Managing your Finances? N  Housekeeping or managing your Housekeeping? Y  Some recent data might be hidden    Patient Care Team: Quay Burow,  Claudina Lick, MD as PCP - General  (Internal Medicine) Cameron Sprang, MD as Consulting Physician (Neurology) Garald Balding, MD as Consulting Physician (Orthopedic Surgery) Chesley Mires, MD as Consulting Physician (Pulmonary Disease) Monna Fam, MD as Consulting Physician (Ophthalmology) Cameron Sprang, MD as Consulting Physician (Neurology) Charlton Haws, [facility] as Pharmacist (Pharmacist)  Indicate any recent Medical Services you may have received from other than [facility] providers in the past year (date may be approximate).     Assessment:   This is a routine wellness examination for [place].  Hearing/Vision screen Hearing Screening - Comments:: Patient denied any hearing difficulty.   No hearing aids.  Vision Screening - Comments:: Patient wears corrective glasses/contacts.  Eye exam done annually by: Dr. Monna Fam  Dietary issues and exercise activities discussed: Current Exercise Habits: The patient does not participate in regular exercise at present, Exercise limited by: cardiac condition(s);Other - see comments;neurologic condition(s) (fibromyalgia, neuropathy)   Goals Addressed               This Visit's Progress     Patient Stated (pt-stated)        Patient declined health goal at this time.      Depression Screen PHQ 2/9 Scores 01/27/2021 11/27/2019 11/22/2018 11/16/2017 11/15/2016  PHQ - 2 Score _0 0  PHQ- 9 Score - - 7 9 -    Fall Risk Fall Risk  01/27/2021 10/13/2020 06/17/2020 11/27/2019 10/23/2019  Falls in the past year? 1 1 0 1 1  Number falls in past yr: 0 1 0 1 1  Injury with Fall? 0 0 0 0 1  Comment - - - - -  Risk Factor Category  - - - - -  Risk for fall due to : Impaired balance/gait History of fall(s) - Impaired balance/gait;Orthopedic patient History of fall(s);Impaired balance/gait;Impaired mobility  Follow up Falls evaluation completed - - Falls evaluation completed -    FALL RISK PREVENTION PERTAINING TO THE HOME:  Any stairs in or around the home? No  If  so, are there any without handrails? No  Home free of loose throw rugs in walkways, pet beds, electrical cords, etc? Yes  Adequate lighting in your home to reduce risk of falls? Yes   ASSISTIVE DEVICES UTILIZED TO PREVENT FALLS:  Life alert? No  Use of a cane, walker or w/c? Yes  Grab bars in the bathroom? No  Shower chair or bench in shower? Yes  Elevated toilet seat or a handicapped toilet? Yes   TIMED UP AND GO:  Was the test performed? No .  Length of time to ambulate 10 feet: n/a sec.   Appearance of gait: patient stated that she uses a walker due to unsteady balance/gait.  Cognitive Function: Normal cognitive status assessed by direct observation by this Nurse Health Advisor. No abnormalities found.          Immunizations Immunization History  Administered Date(s) Administered   Fluad Quad(high Dose 65+) 12/22/2018   Influenza Split 10/26/2011   Influenza, High Dose Seasonal PF 11/15/2016, 09/27/2017   Influenza,inj,Quad PF,6+ Mos 10/25/2012, 11/25/2015   Influenza-Unspecified 10/19/2014   PFIZER(Purple Top)SARS-COV-2 Vaccination 04/27/2019, 05/25/2019   Pneumococcal Conjugate-13 05/24/2016   Pneumococcal Polysaccharide-23 06/27/2017   Td 10/04/2008    TDAP status: Due, Education has been provided regarding the importance of this vaccine. Advised may receive this vaccine at local pharmacy or Health Dept. Aware to provide a copy of the vaccination record if obtained from local pharmacy or  Health Dept. Verbalized acceptance and understanding.  Flu Vaccine status: Due, Education has been provided regarding the importance of this vaccine. Advised may receive this vaccine at local pharmacy or Health Dept. Aware to provide a copy of the vaccination record if obtained from local pharmacy or Health Dept. Verbalized acceptance and understanding.  Pneumococcal vaccine status: Up to date  Covid-19 vaccine status: Completed vaccines  Qualifies for Shingles Vaccine? Yes    Zostavax completed No   Shingrix Completed?: No.    Education has been provided regarding the importance of this vaccine. Patient has been advised to call insurance company to determine out of pocket expense if they have not yet received this vaccine. Advised may also receive vaccine at local pharmacy or Health Dept. Verbalized acceptance and understanding.  Screening Tests Health Maintenance  Topic Date Due   Zoster Vaccines- Shingrix (1 of 2) Never done   FOOT EXAM  11/17/2018   COVID-19 Vaccine (3 - Booster for Pfizer series) 07/20/2019   INFLUENZA VACCINE  08/18/2020   HEMOGLOBIN A1C  10/18/2020   OPHTHALMOLOGY EXAM  11/13/2020   MAMMOGRAM  04/18/2021 (Originally 11/30/2019)   DEXA SCAN  04/18/2021 (Originally 11/18/2019)   TETANUS/TDAP  04/18/2021 (Originally 10/05/2018)   COLONOSCOPY (Pts 45-88yr Insurance coverage will need to be confirmed)  07/08/2023   Pneumonia Vaccine 71 Years old  Completed   Hepatitis C Screening  Completed   HPV VACCINES  Aged Out    Health Maintenance  Health Maintenance Due  Topic Date Due   Zoster Vaccines- Shingrix (1 of 2) Never done   FOOT EXAM  11/17/2018   COVID-19 Vaccine (3 - Booster for Pfizer series) 07/20/2019   INFLUENZA VACCINE  08/18/2020   HEMOGLOBIN A1C  10/18/2020   OPHTHALMOLOGY EXAM  11/13/2020    Colorectal cancer screening: Type of screening: Colonoscopy. Completed 07/07/2020. Repeat every 3 years  Mammogram status: Ordered 01/27/2021. Pt provided with contact info and advised to call to schedule appt.   Bone Density status: Ordered 01/27/2021. Pt provided with contact info and advised to call to schedule appt.  Lung Cancer Screening: (Low Dose CT Chest recommended if Age 71-80years, 30 pack-year currently smoking OR have quit w/in 15years.) does not qualify.   Lung Cancer Screening Referral: no  Additional Screening:  Hepatitis C Screening: does qualify; Completed yes  Vision Screening: Recommended annual  ophthalmology exams for early detection of glaucoma and other disorders of the eye. Is the patient up to date with their annual eye exam?  Yes  Who is the provider or what is the name of the office in which the patient attends annual eye exams? KKandy Garrison MD. If pt is not established with a provider, would they like to be referred to a provider to establish care? No .   Dental Screening: Recommended annual dental exams for proper oral hygiene  Community Resource Referral / Chronic Care Management: CRR required this visit?  No   CCM required this visit?  No      Plan:     I have personally reviewed and noted the following in the patients chart:   Medical and social history Use of alcohol, tobacco or illicit drugs  Current medications and supplements including opioid prescriptions.  Functional ability and status Nutritional status Physical activity Advanced directives List of other physicians Hospitalizations, surgeries, and ER visits in previous 12 months Vitals Screenings to include cognitive, depression, and falls Referrals and appointments  In addition, I have reviewed and discussed with patient certain  preventive protocols, quality metrics, and best practice recommendations. A written personalized care plan for preventive services as well as general preventive health recommendations were provided to patient.     Sheral Flow, LPN   0/17/2091   Nurse Notes:  Patient is cogitatively intact. There were no vitals filed for this visit. There is no height or weight on file to calculate BMI. Hearing Screening - Comments:: Patient denied any hearing difficulty.   No hearing aids.  Vision Screening - Comments:: Patient wears corrective glasses/contacts.  Eye exam done annually by: Dr. Monna Fam

## 2021-02-03 ENCOUNTER — Encounter: Payer: Self-pay | Admitting: Internal Medicine

## 2021-02-03 NOTE — Progress Notes (Signed)
Subjective:    Patient ID: Alexis Grant, female    DOB: 09/14/1950, 71 y.o.   MRN: [id number]  This visit occurred during the SARS-CoV-2 public health emergency.  Safety protocols were in place, including screening questions prior to the visit, additional usage of staff PPE, and extensive cleaning of exam room while observing appropriate contact time as indicated for disinfecting solutions.     HPI The patient is here for follow up of their chronic medical problems, including severe OA in hips, htn, DM, hld, hypothyroid, depression, insomnia, overactive bladder  Her sister died early 01/29/23 from Flu and PNA-she had cerebral palsy.   Her son died xmas day.  He was 47.  They are still not sure why-he was found in bed.  Her husband died at 75 from a heart attack.  She is not sleeping.  Not eating much.  She is talking to her pastor.  She has a son that lives in the area and a daughter that lives in [place] and some close friends.  Sugars are 150-160 - much higher than usual.  Recently 120-130's  Medications and allergies reviewed with patient and updated if appropriate.  Patient Active Problem List   Diagnosis Date Noted   COVID-19 virus infection 02/05/2020   Overactive bladder 11/23/2018   Frequent UTI 05/03/2018   Lump of skin 12/30/2017   Watery eyes 12/30/2017   Polyneuropathy 09/27/2017   Osteoarthritis, hip, bilateral 05/25/2017   Osteopenia 11/22/2016   Difficulty sleeping 05/24/2016   Rosacea 11/25/2015   Poor balance 11/25/2015   Tremor 04/18/2015   Other fatigue 02/17/2015   Migraine with aura and without status migrainosus, not intractable 12/04/2014   Localization-related (focal) (partial) idiopathic epilepsy and epileptic syndromes with seizures of localized onset, not intractable, without status epilepticus ([place]) 12/04/2014   Hx of adenomatous colonic polyps 07/16/2014   Benign neoplasm of ascending colon 07/16/2014   Benign neoplasm of descending colon  07/16/2014   Benign neoplasm of cecum 07/16/2014   Benign neoplasm of transverse colon 07/16/2014   Fibromyalgia 05/30/2013   Generalized convulsive epilepsy ([place]) 10/20/2012   Seasonal allergies 12/08/2011   DM (diabetes mellitus), type 2 with peripheral vascular complications ([place]) 69/[id number]/[id number]   Essential hypertension 01/05/2010   Irritable bowel syndrome 01/05/2010   VERTIGO 09/16/2009   Diarrhea 09/16/2009   Vitamin D deficiency 02/24/2009   MYOCARDIAL INFARCTION, ACUTE, SUBENDOCARDIAL 10/04/2008   Hypothyroidism 03/19/2008   COLONIC POLYPS, ADENOMATOUS, HX OF 08/07/2007   HEMANGIOMA 04/26/2007   Dyslipidemia 04/26/2007   OBESITY 04/26/2007   Depression 04/26/2007   SLEEP APNEA, OBSTRUCTIVE 04/26/2007   HIATAL HERNIA 04/26/2007   FATTY LIVER DISEASE 04/26/2007    Current Outpatient Medications on File Prior to Visit  Medication Sig Dispense Refill   ACCU-CHEK GUIDE test strip USE UP TO FOUR TIMES DAILY AS DIRECTED 100 each 0   acetaminophen (TYLENOL) 500 MG tablet Take 1 tablet (500 mg total) by mouth every 6 (six) hours as needed. 30 tablet 3   acetaminophen-codeine (TYLENOL #3) 300-30 MG tablet TAKE 2 TABLETS BY MOUTH TWICE DAILY AS NEEDED FOR MODERATE PAIN 120 tablet 0   albuterol (VENTOLIN HFA) 108 (90 Base) MCG/ACT inhaler Inhale 2 puffs into the lungs every 6 (six) hours as needed for wheezing or shortness of breath. 8 g 5   blood glucose meter kit and supplies KIT Dispense based on patient and insurance preference. Use up to four times daily as directed. (FOR E11.9). 1 each 0  cholecalciferol (VITAMIN D3) 25 MCG (1000 UNIT) tablet Take 1,000 Units by mouth daily.     divalproex (DEPAKOTE) 250 MG DR tablet Take 1 tablet (250 mg total) by mouth 2 (two) times daily. 180 tablet 3   Galcanezumab-gnlm (EMGALITY) 120 MG/ML SOAJ Inject 120 mg into the skin every 28 (twenty-eight) days. 1.12 mL 1   glipiZIDE (GLUCOTROL) 10 MG tablet TAKE 1 TABLET BY MOUTH TWICE DAILY BEFORE A MEAL  180 tablet 0   levothyroxine (SYNTHROID) 75 MCG tablet TAKE 1 TABLET BY MOUTH ONCE DAILY BEFORE BREAKFAST 90 tablet 0   loperamide (IMODIUM) 2 MG capsule Take 1 capsule (2 mg total) by mouth 4 (four) times daily as needed for diarrhea or loose stools. 12 capsule 0   losartan (COZAAR) 50 MG tablet Take 1 tablet by mouth once daily 90 tablet 0   ondansetron (ZOFRAN-ODT) 4 MG disintegrating tablet Take 1 tablet (4 mg total) by mouth every 8 (eight) hours as needed for nausea or vomiting. 20 tablet 0   sertraline (ZOLOFT) 100 MG tablet Take 1.5 tablets (150 mg total) by mouth daily. 135 tablet 1   simvastatin (ZOCOR) 20 MG tablet TAKE 1 TABLET BY MOUTH AT BEDTIME 90 tablet 0   topiramate (TOPAMAX) 200 MG tablet TAKE 1 TABLET BY MOUTH TWICE DAILY 180 tablet 3   traZODone (DESYREL) 50 MG tablet TAKE 2 TABLETS BY MOUTH AT BEDTIME FOR SLEEP 180 tablet 0   No current facility-administered medications on file prior to visit.    Past Medical History:  Diagnosis Date   Adenomatous polyp of colon 2008   Allergy    Anemia    Anxiety    Arthritis    Blood transfusion without reported diagnosis    following knee surgery    Cataract    hx- removed both eyes   Depression    Diabetes mellitus    type II    Dyslipidemia    Fibromyalgia    Gallstones    Gastritis    GERD (gastroesophageal reflux disease)    Hemangioma    Hiatal hernia    Fibromyalgia   Hyperlipidemia    Hypertension    Hypothyroidism 2010   Dr.Deveshawr   IBS (irritable bowel syndrome)    Dr Fuller Plan   Migraines    Myocardial infarct [facility]) 2010   subendocardrial, following R TKR   Obesity    OSA (obstructive sleep apnea)    cpap - does not know settings    Pneumonia    Seizure disorder ([place])    Seizures ([place])    last seizure was 2002 per pt 07-29-16   Sleep apnea    Urinary tract infection     Past Surgical History:  Procedure Laterality Date   [place]  2007   DrGamble  ( now seeing Dr Tonita Cong cardiology)   [place]   COLONOSCOPY     COLONOSCOPY WITH PROPOFOL N/A 07/16/2014   Procedure: COLONOSCOPY WITH PROPOFOL;  Surgeon: Ladene Artist, MD;  Location: [facility] END[facility];  Service: Endoscopy;  Laterality: N/A;   COLONOSCOPY WITH PROPOFOL N/A 09/21/2016   Procedure: COLONOSCOPY WITH PROPOFOL;  Surgeon: Ladene Artist, MD;  Location: [facility];  Service: Endoscopy;  Laterality: N/A;   POLYPECTOMY  2012   6 or 7 adenomatous, Dr.Stark   steroid injection to left si joint  12/2008   Dr.Newton    TOTAL KNEE ARTHROPLASTY Right 11/2005   TOTAL KNEE ARTHROPLASTY Left  TUBAL LIGATION  1986    Social History   Socioeconomic History   Marital status: Widowed    Spouse name: Not on file   Number of children: 3   Years of education: college   Highest education level: Not on file  Occupational History   Occupation: Retired  Tobacco Use   Smoking status: Never   Smokeless tobacco: Never  Vaping Use   Vaping Use: Never used  Substance and Sexual Activity   Alcohol use: No    Alcohol/week: 0.0 standard drinks   Drug use: No   Sexual activity: Not Currently  Other Topics Concern   Not on file  Social History Narrative   Illicit drug use- no   Patient does not get regular exercise due to knee.   Patient lives at home alone.    Patient son and wife lives next door.   Patient has 3 children.    Patient has some college.    Patient retired May 2014.    Social Determinants of Health   Financial Resource Strain: Low Risk    Difficulty of Paying Living Expenses: Not hard at all  Food Insecurity: No Food Insecurity   Worried About Charity fundraiser in the Last Year: Never true   [place] in the Last Year: Never true  Transportation Needs: No Transportation Needs   Lack of Transportation (Medical): No   Lack of Transportation (Non-Medical): No  Physical Activity: Inactive   Days of Exercise per Week: 0 days   Minutes of  Exercise per Session: 0 min  Stress: Stress Concern Present   Feeling of Stress : Rather much  Social Connections: Socially Isolated   Frequency of Communication with Friends and Family: More than three times a week   Frequency of Social Gatherings with Friends and Family: Once a week   Attends Religious Services: Never   Marine scientist or Organizations: No   Attends Archivist Meetings: Never   Marital Status: Widowed    Family History  Problem Relation Age of Onset   Colon polyps Mother    Ulcers Father    Colon polyps Brother    Esophageal cancer Brother    Diabetes Sister    Kidney failure Sister    Diabetes Paternal Aunt    Esophageal cancer Brother    Kidney cancer Sister    Renal cancer Sister    Colon cancer Neg Hx    Heart attack Neg Hx    Stroke Neg Hx    Cancer Neg Hx    Gallbladder disease Neg Hx    Rectal cancer Neg Hx    Stomach cancer Neg Hx     Review of Systems  Constitutional:  Positive for appetite change.  Respiratory:  Negative for cough, shortness of breath and wheezing.   Cardiovascular:  Negative for chest pain, palpitations and leg swelling.  Gastrointestinal:  Negative for abdominal pain and nausea.  Neurological:  Positive for light-headedness and headaches (chronic).  Psychiatric/Behavioral:  Positive for sleep disturbance.       Objective:   Vitals:   02/04/21 1354  BP: 116/78  Pulse: (!) 105  Temp: 97.9 F (36.6 C)  SpO2: 98%   BP Readings from Last 3 Encounters:  02/04/21 116/78  07/07/20 (!) 145/63  04/18/20 130/72   Wt Readings from Last 3 Encounters:  02/04/21 (!) 310 lb 3.2 oz (140.7 kg)  10/13/20 283 lb (128.4 kg)  07/07/20 281 lb (127.5 kg)  Body mass index is 54.95 kg/m.   Physical Exam    Constitutional: Appears well-developed and well-nourished. No distress.  HENT:  Head: Normocephalic and atraumatic.  Neck: Neck supple. No tracheal deviation present. No thyromegaly present.  No cervical  lymphadenopathy Cardiovascular: Normal rate, regular rhythm and normal heart sounds.   No murmur heard. No carotid bruit .  No edema Pulmonary/Chest: Effort normal and breath sounds normal. No respiratory distress. No has no wheezes. No rales.  Skin: Skin is warm and dry. Not diaphoretic.  Psychiatric: Normal mood and affect. Behavior is normal.      Assessment & Plan:    See Problem List for Assessment and Plan of chronic medical problems.

## 2021-02-03 NOTE — Patient Instructions (Addendum)
° ° °  Blood work was ordered.     Medications changes include :   none  Your prescription(s) have been submitted to your pharmacy. Please take as directed and contact our office if you believe you are having problem(s) with the medication(s).    Please followup in 6 months, sooner if needed

## 2021-02-04 ENCOUNTER — Other Ambulatory Visit: Payer: Self-pay

## 2021-02-04 ENCOUNTER — Ambulatory Visit (INDEPENDENT_AMBULATORY_CARE_PROVIDER_SITE_OTHER): Payer: Medicare HMO | Admitting: Internal Medicine

## 2021-02-04 VITALS — BP 116/78 | HR 105 | Temp 97.9°F | Ht 63.0 in | Wt 310.2 lb

## 2021-02-04 DIAGNOSIS — M160 Bilateral primary osteoarthritis of hip: Secondary | ICD-10-CM

## 2021-02-04 DIAGNOSIS — E1151 Type 2 diabetes mellitus with diabetic peripheral angiopathy without gangrene: Secondary | ICD-10-CM | POA: Diagnosis not present

## 2021-02-04 DIAGNOSIS — Z23 Encounter for immunization: Secondary | ICD-10-CM | POA: Diagnosis not present

## 2021-02-04 DIAGNOSIS — E559 Vitamin D deficiency, unspecified: Secondary | ICD-10-CM | POA: Diagnosis not present

## 2021-02-04 DIAGNOSIS — E039 Hypothyroidism, unspecified: Secondary | ICD-10-CM

## 2021-02-04 DIAGNOSIS — G479 Sleep disorder, unspecified: Secondary | ICD-10-CM | POA: Diagnosis not present

## 2021-02-04 DIAGNOSIS — F3289 Other specified depressive episodes: Secondary | ICD-10-CM

## 2021-02-04 DIAGNOSIS — F4321 Adjustment disorder with depressed mood: Secondary | ICD-10-CM | POA: Insufficient documentation

## 2021-02-04 DIAGNOSIS — E785 Hyperlipidemia, unspecified: Secondary | ICD-10-CM | POA: Diagnosis not present

## 2021-02-04 DIAGNOSIS — I10 Essential (primary) hypertension: Secondary | ICD-10-CM

## 2021-02-04 DIAGNOSIS — N3281 Overactive bladder: Secondary | ICD-10-CM

## 2021-02-04 DIAGNOSIS — G43109 Migraine with aura, not intractable, without status migrainosus: Secondary | ICD-10-CM

## 2021-02-04 LAB — CBC WITH DIFFERENTIAL/PLATELET
Basophils Absolute: 0 10*3/uL (ref 0.0–0.1)
Basophils Relative: 0.6 % (ref 0.0–3.0)
Eosinophils Absolute: 0.1 10*3/uL (ref 0.0–0.7)
Eosinophils Relative: 1.7 % (ref 0.0–5.0)
HCT: 39.3 % (ref 36.0–46.0)
Hemoglobin: 12.8 g/dL (ref 12.0–15.0)
Lymphocytes Relative: 37.8 % (ref 12.0–46.0)
Lymphs Abs: 2.7 10*3/uL (ref 0.7–4.0)
MCHC: 32.5 g/dL (ref 30.0–36.0)
MCV: 86.9 fl (ref 78.0–100.0)
Monocytes Absolute: 0.5 10*3/uL (ref 0.1–1.0)
Monocytes Relative: 6.8 % (ref 3.0–12.0)
Neutro Abs: 3.7 10*3/uL (ref 1.4–7.7)
Neutrophils Relative %: 53.1 % (ref 43.0–77.0)
Platelets: 212 10*3/uL (ref 150.0–400.0)
RBC: 4.53 Mil/uL (ref 3.87–5.11)
RDW: 14.8 % (ref 11.5–15.5)
WBC: 7 10*3/uL (ref 4.0–10.5)

## 2021-02-04 LAB — LIPID PANEL
Cholesterol: 159 mg/dL (ref 0–200)
HDL: 52.9 mg/dL (ref >39.00)
LDL Cholesterol: 81 mg/dL (ref 0–99)
NonHDL: 105.76
Total CHOL/HDL Ratio: 3
Triglycerides: 123 mg/dL (ref 0.0–149.0)
VLDL: 24.6 mg/dL (ref 0.0–40.0)

## 2021-02-04 LAB — COMPREHENSIVE METABOLIC PANEL
ALT: 6 U/L (ref 0–35)
AST: 7 U/L (ref 0–37)
Albumin: 3.9 g/dL (ref 3.5–5.2)
Alkaline Phosphatase: 57 U/L (ref 39–117)
BUN: 9 mg/dL (ref 6–23)
CO2: 28 mEq/L (ref 19–32)
Calcium: 9.5 mg/dL (ref 8.4–10.5)
Chloride: 106 mEq/L (ref 96–112)
Creatinine, Ser: 0.72 mg/dL (ref 0.40–1.20)
GFR: 84.86 mL/min (ref >60.00)
Glucose, Bld: 94 mg/dL (ref 70–99)
Potassium: 4.3 mEq/L (ref 3.5–5.1)
Sodium: 141 mEq/L (ref 135–145)
Total Bilirubin: 0.3 mg/dL (ref 0.2–1.2)
Total Protein: 6.8 g/dL (ref 6.0–8.3)

## 2021-02-04 LAB — HEMOGLOBIN A1C: Hgb A1c MFr Bld: 7.1 % — ABNORMAL HIGH (ref 4.6–6.5)

## 2021-02-04 LAB — TSH: TSH: 2.75 u[IU]/mL (ref 0.35–5.50)

## 2021-02-04 LAB — VITAMIN D 25 HYDROXY (VIT D DEFICIENCY, FRACTURES): VITD: 40.39 ng/mL (ref 30.00–100.00)

## 2021-02-04 MED ORDER — LEVOTHYROXINE SODIUM 75 MCG PO TABS: 75.0000 ug | ORAL_TABLET | Freq: Every day | ORAL | 2 refills | Status: DC

## 2021-02-04 MED ORDER — LOSARTAN POTASSIUM 50 MG PO TABS: 50.0000 mg | ORAL_TABLET | Freq: Every day | ORAL | 2 refills | Status: DC

## 2021-02-04 NOTE — Assessment & Plan Note (Signed)
Chronic Healthy diet encouraged Check lipid panel  Continue simvastatin 20 mg daily

## 2021-02-04 NOTE — Assessment & Plan Note (Signed)
Chronic Taking vitamin D daily Check vitamin D level  

## 2021-02-04 NOTE — Assessment & Plan Note (Signed)
Acute Her sister died suddenly 2021-12-31 and her son died suddenly 01/13/22 They suspect that her son may have had a heart attack, but her not sure yet She is grieving with worsening depression, decreased appetite and difficulty sleeping She declined any medication changes or additions at this time She declined referral to a therapist She is talking to her pastor and has close family and friends that are helping She will let me know if she needs anything

## 2021-02-04 NOTE — Assessment & Plan Note (Signed)
Chronic Severe bilateral hip pain from osteoarthritis Worse when she is sitting long periods of time-encouraged getting up and moving around more Continue Tylenol #3    2 tabs twice daily Follow-up in 6 months

## 2021-02-04 NOTE — Assessment & Plan Note (Signed)
Chronic Continue trazodone 100 mg at bedtime Typically this works, but recently she has had 2 deaths in her family close family members and she currently is not sleeping Discussed that we can change her medication to see if that would help, but she declined for now Encouraged her to call me if she continues to not sleep so that I can help with medication changes

## 2021-02-04 NOTE — Assessment & Plan Note (Signed)
Chronic Blood pressure well controlled CMP Continue losartan 50 mg daily 

## 2021-02-04 NOTE — Assessment & Plan Note (Signed)
Chronic  Clinically euthyroid Currently taking levothyroxine 75 mcg daily Check tsh  Titrate med dose if needed  

## 2021-02-04 NOTE — Assessment & Plan Note (Signed)
Chronic Sugars have been slightly higher recently which is likely related to not eating well because of the sudden death of her son and sister Reassured her.  Encouraged her to try to eat the best she can so that she is getting nutrition We will check A1c-we can adjust medication if needed

## 2021-02-04 NOTE — Assessment & Plan Note (Signed)
Chronic freq headaches Following with Dr [name] On topamax 200 mg bid emgality not effective

## 2021-02-04 NOTE — Assessment & Plan Note (Signed)
Chronic Currently on sertraline 150 mg daily-we will continue Last month her sister died suddenly and her son died suddenly She is grieving and has worsening depression because of that She is talking to her pastor.  She has good family and friends as support. Discussed referral to a therapist-she declined at this time Discussed adjusting medications, but she declined at this time She knows she can call me if she needs anything

## 2021-02-06 ENCOUNTER — Other Ambulatory Visit: Payer: Self-pay | Admitting: Internal Medicine

## 2021-02-11 ENCOUNTER — Telehealth: Payer: Self-pay | Admitting: Internal Medicine

## 2021-02-11 MED ORDER — ACETAMINOPHEN-CODEINE #3 300-30 MG PO TABS: ORAL_TABLET | ORAL | 0 refills | Status: DC

## 2021-02-11 NOTE — Telephone Encounter (Signed)
1.Medication Requested: acetaminophen-codeine (TYLENOL #3) 300-30 MG tablet  2. Pharmacy (Name, Street, [place]): [place], [address]  3. On Med List: yes  4. Last Visit with PCP: 02-04-2021  5. Next visit date with PCP: 08-05-2021   Agent: Please be advised that RX refills may take up to 3 business days. We ask that you follow-up with your pharmacy.

## 2021-02-25 ENCOUNTER — Telehealth: Payer: Self-pay

## 2021-02-25 ENCOUNTER — Telehealth: Payer: Medicare Other

## 2021-02-25 NOTE — Chronic Care Management (AMB) (Signed)
Chronic Care Management Pharmacy Assistant   Name: JULIAHNA WISWELL  MRN: [id number] DOB: [birth year]  Alexis Grant is an 71 y.o. year old female who presents for his follow-up CCM visit with the clinical pharmacist.  Reason for Encounter: Disease State   Conditions to be addressed/monitored: DMII   Recent office visits:  02/04/21 Binnie Rail, MD-PCP (DM (diabetes mellitus), type 2 with peripheral vascular complications) Blood work ordered, no med changes  01/26/21 Binnie Rail, MD-PCP (DM (diabetes mellitus), type 2 with peripheral vascular complications) No orders or med changes  Recent consult visits:  None ID  Hospital visits:  None since last coordination call  Medications: Outpatient Encounter Medications as of 02/25/2021  Medication Sig   ACCU-CHEK GUIDE test strip USE UP TO FOUR TIMES DAILY AS DIRECTED   acetaminophen (TYLENOL) 500 MG tablet Take 1 tablet (500 mg total) by mouth every 6 (six) hours as needed.   acetaminophen-codeine (TYLENOL #3) 300-30 MG tablet TAKE 2 TABLETS BY MOUTH TWICE DAILY AS NEEDED FOR MODERATE PAIN   albuterol (VENTOLIN HFA) 108 (90 Base) MCG/ACT inhaler Inhale 2 puffs into the lungs every 6 (six) hours as needed for wheezing or shortness of breath.   blood glucose meter kit and supplies KIT Dispense based on patient and insurance preference. Use up to four times daily as directed. (FOR E11.9).   cholecalciferol (VITAMIN D3) 25 MCG (1000 UNIT) tablet Take 1,000 Units by mouth daily.   divalproex (DEPAKOTE) 250 MG DR tablet Take 1 tablet (250 mg total) by mouth 2 (two) times daily.   Galcanezumab-gnlm (EMGALITY) 120 MG/ML SOAJ Inject 120 mg into the skin every 28 (twenty-eight) days.   glipiZIDE (GLUCOTROL) 10 MG tablet TAKE 1 TABLET BY MOUTH TWICE DAILY BEFORE A MEAL   levothyroxine (SYNTHROID) 75 MCG tablet Take 1 tablet (75 mcg total) by mouth daily before breakfast.   loperamide (IMODIUM) 2 MG capsule Take 1 capsule (2 mg total) by  mouth 4 (four) times daily as needed for diarrhea or loose stools.   losartan (COZAAR) 50 MG tablet Take 1 tablet (50 mg total) by mouth daily.   ondansetron (ZOFRAN-ODT) 4 MG disintegrating tablet Take 1 tablet (4 mg total) by mouth every 8 (eight) hours as needed for nausea or vomiting.   sertraline (ZOLOFT) 100 MG tablet Take 1.5 tablets (150 mg total) by mouth daily.   simvastatin (ZOCOR) 20 MG tablet TAKE 1 TABLET BY MOUTH AT BEDTIME   topiramate (TOPAMAX) 200 MG tablet TAKE 1 TABLET BY MOUTH TWICE DAILY   traZODone (DESYREL) 50 MG tablet TAKE 2 TABLETS BY MOUTH AT BEDTIME FOR SLEEP   No facility-administered encounter medications on file as of 02/25/2021.   Recent Relevant Labs: Lab Results  Component Value Date/Time   HGBA1C 7.1 (H) 02/04/2021 02:43 PM   HGBA1C 5.7 04/18/2020 02:59 PM   MICROALBUR 4.2 (H) 11/25/2015 09:39 AM   MICROALBUR 2.0 (H) 10/01/2014 12:11 PM    Kidney Function Lab Results  Component Value Date/Time   CREATININE 0.72 02/04/2021 02:43 PM   CREATININE 0.62 04/18/2020 02:59 PM   CREATININE 0.56 08/22/2019 02:12 PM   CREATININE 0.64 04/09/2011 09:21 AM   GFR 84.86 02/04/2021 02:43 PM   GFRNONAA 96 08/22/2019 02:12 PM   GFRAA 111 08/22/2019 02:12 PM    Current antihyperglycemic regimen:  Glipizide 10 mg BID  What recent interventions/DTPs have been made to improve glycemic control:  Check A1c on 02/04/21, per Dr. Quay Burow  Have there been any  recent hospitalizations or ED visits since last visit with CPP? No  Patient denies hypoglycemic symptoms, including None  Patient denies hyperglycemic symptoms, including none  How often are you checking your blood sugar? once daily, patient states that she is under some stress since she loss her son back in December   What are your blood sugars ranging? 112-168 Fasting: 137 02/26/21  During the week, how often does your blood glucose drop below 70? Never  Are you checking your feet daily/regularly? Patient  states she has no issues with her feet  Adherence Review: Is the patient currently on a STATIN medication? Yes Is the patient currently on ACE/ARB medication? Yes Does the patient have >5 day gap between last estimated fill dates? No   Care Gaps: Colonoscopy-07/07/20 Diabetic Foot Exam-11/16/17 Mammogram-11/29/17 Ophthalmology-11/14/19 Dexa Scan - 11/17/16 Annual Well Visit - NA Micro albumin-NA Hemoglobin A1c- 02/04/21  Star Rating Drugs: Simvastatin 20 mg-last fill 12/29/20 90 ds Losartan 50 mg-last fill 02/04/21 90 ds   Ethelene Hal Clinical Pharmacist Assistant [phone]

## 2021-02-26 ENCOUNTER — Telehealth: Payer: Self-pay | Admitting: Internal Medicine

## 2021-02-26 MED ORDER — TRAZODONE HCL 50 MG PO TABS: ORAL_TABLET | ORAL | 0 refills | Status: DC

## 2021-02-26 NOTE — Telephone Encounter (Signed)
1.Medication Requested: topiramate (TOPAMAX) 200 MG tablet  traZODone (DESYREL) 50 MG tablet  2. Pharmacy (Name, [place], [facility]): [place], [address]  3. On Med List: yes   4. Last Visit with PCP: 02-04-2021  5. Next visit date with PCP: 08-05-2021  Pt requesting new rx sent to [facility], informed pt Trazodone may be to early to fill

## 2021-03-10 ENCOUNTER — Other Ambulatory Visit: Payer: Self-pay | Admitting: Internal Medicine

## 2021-03-12 ENCOUNTER — Telehealth: Payer: Self-pay | Admitting: Internal Medicine

## 2021-03-12 ENCOUNTER — Other Ambulatory Visit: Payer: Self-pay

## 2021-03-12 MED ORDER — SERTRALINE HCL 100 MG PO TABS: 150.0000 mg | ORAL_TABLET | Freq: Every day | ORAL | 1 refills | Status: DC

## 2021-03-12 NOTE — Telephone Encounter (Signed)
1.Medication Requested: sertraline (ZOLOFT) 100 MG tablet  2. Pharmacy (Name, [place], [facility]):  [place], [address] Phone:  [phone]  Fax:  [phone]      3. On Med List: yes  4. Last Visit with PCP: 01.18.23  5. Next visit date with PCP: 07.19.23   Agent: Please be advised that RX refills may take up to 3 business days. We ask that you follow-up with your pharmacy.

## 2021-03-12 NOTE — Telephone Encounter (Signed)
Sent in today 

## 2021-03-20 ENCOUNTER — Telehealth: Payer: Self-pay | Admitting: Neurology

## 2021-03-20 ENCOUNTER — Other Ambulatory Visit: Payer: Self-pay

## 2021-03-20 MED ORDER — TOPIRAMATE 200 MG PO TABS: ORAL_TABLET | ORAL | 0 refills | Status: DC

## 2021-03-20 NOTE — Telephone Encounter (Signed)
Medication was sent to new pharmacy  ?

## 2021-03-20 NOTE — Telephone Encounter (Signed)
Patient has a new pharmacy ? ?[facility] ?[phone]  ?Needs a new RX topiramate 200mg  ?

## 2021-03-26 ENCOUNTER — Telehealth: Payer: Self-pay

## 2021-03-26 ENCOUNTER — Other Ambulatory Visit: Payer: Self-pay

## 2021-03-26 MED ORDER — SIMVASTATIN 20 MG PO TABS: 20.0000 mg | ORAL_TABLET | Freq: Every day | ORAL | 2 refills | Status: DC

## 2021-03-26 NOTE — Telephone Encounter (Signed)
Sent in today 

## 2021-03-26 NOTE — Telephone Encounter (Signed)
Pt is requesting refill on: ?simvastatin (ZOCOR) 20 MG tablet ? ?Pharmacy: ?[place], [address] ? ?LOV 02/04/21 ?

## 2021-03-31 ENCOUNTER — Telehealth: Payer: Self-pay | Admitting: Internal Medicine

## 2021-03-31 NOTE — Telephone Encounter (Signed)
Pt states a fax request for a new gliclose meter was multiple times, last being sent on 3-9, pt did not know name of meter ? ?I was unable to locate fax ? ?Contacted [facility] who verified fax number and last date of fax ? ?Rep stated another fax will be sent today 3-14 ?

## 2021-04-02 ENCOUNTER — Other Ambulatory Visit: Payer: Self-pay

## 2021-04-02 MED ORDER — GLIPIZIDE 10 MG PO TABS: 10.0000 mg | ORAL_TABLET | Freq: Two times a day (BID) | ORAL | 2 refills | Status: DC

## 2021-04-03 NOTE — Telephone Encounter (Signed)
Placed in folder to be signed. ?

## 2021-04-06 NOTE — Telephone Encounter (Signed)
Faxed today

## 2021-04-09 ENCOUNTER — Other Ambulatory Visit: Payer: Self-pay | Admitting: Internal Medicine

## 2021-04-13 ENCOUNTER — Telehealth: Payer: Self-pay | Admitting: Internal Medicine

## 2021-04-13 NOTE — Telephone Encounter (Signed)
States dosage and strength of medication is missing from rx request sent over to them.  ? ?Please call or fax over new rx with all information.  ?

## 2021-04-15 ENCOUNTER — Encounter: Payer: Self-pay | Admitting: Neurology

## 2021-04-15 ENCOUNTER — Other Ambulatory Visit: Payer: Self-pay

## 2021-04-15 ENCOUNTER — Ambulatory Visit: Payer: Medicare HMO | Admitting: Neurology

## 2021-04-15 VITALS — BP 138/78 | HR 90 | Ht 63.0 in | Wt 311.8 lb

## 2021-04-15 DIAGNOSIS — R251 Tremor, unspecified: Secondary | ICD-10-CM

## 2021-04-15 DIAGNOSIS — G43109 Migraine with aura, not intractable, without status migrainosus: Secondary | ICD-10-CM

## 2021-04-15 DIAGNOSIS — G40309 Generalized idiopathic epilepsy and epileptic syndromes, not intractable, without status epilepticus: Secondary | ICD-10-CM | POA: Diagnosis not present

## 2021-04-15 MED ORDER — TOPIRAMATE 200 MG PO TABS: ORAL_TABLET | ORAL | 3 refills | Status: DC

## 2021-04-15 NOTE — Progress Notes (Signed)
? ?NEUROLOGY FOLLOW UP OFFICE NOTE ? ?Alexis Grant ?[id number] ?[birth year] ? ?HISTORY OF PRESENT ILLNESS: ?I had the pleasure of seeing Alexis Grant in follow-up in the neurology clinic on 04/15/2021.  The patient was last evaluated 6 months ago for seizures, migraines and tremors. She remains seizure-free since 2002. On her last visit, she reported worsening tremors and we discussed reducing Depakote to 234m BID. She is also on Topiramate 2037mBID for migraine and seizure prophylaxis. She states the tremors are still pretty bad but they are a little better, "not quite as bad." She denies any seizures. She continues to have more than 15 migraine days a week, she takes Excedrin migraine which takes the edge off a little bit when taken at the onset. There have been severe migraines where she has to close the shades and stay in a dark and quiet place. Her face goes numb sometimes and she sees little dots of color in her vision. She tried the [employer] 6 months with no change in migraines. She does not drive due to bad neuropathy, she can hardly feel anything in her feet. She ambulates with a walker, no falls in over a year. Sleep is good.  ? ? ?History on Initial Assessment 11/28/2014: This is a pleasant 71 RH woman with a history of hypertension, hyperlipidemia, diabetes, obesity, sleep apnea, CAD s/p MI, with seizures and migraines. She reports seizures started at age 71she woke up in the hospital with no prior warning symptoms. Since then, she has had 4 generalized tonic-clonic seizures, last was in 2002. She also report 5 or 6 "little episodes" where she feels dizzy, with blurred vision, then if she sits down quickly and tries to relax, she can avoid any progression. She would feel briefly confused. No associated olfactory/gustatory hallucinations, focal numbness/tingling/weakness, myoclonic jerks. The last time she had these episodes was at least 12 years ago. She recalls taking Neurontin initially,  which did not help. Topamax in the past which caused GI symptoms. She has been taking Depakote ER 50072mn AM, 1000m20m PM for many years now with no side effects. She reports having brain imaging and EEG in the past which were normal, no records available for review.  ?  ?She reports migraines started at age 53. 13e has an aura of numbness in her face, cheeks, and temple, then she becomes very sensitive to lights, sounds, and smells. She sees flashing lights then her vision becomes blurred, followed by throbbing headaches in the frontal or occipital regions with associated nausea. No associated focal numbness/tingling/weakness in the extremities. She tried Imitrex with minimal effect. A course of Prednisone  ? ?She has chronic neck pain and has been dealing with diarrhea from IBS. She had an MI in 2010.  ?  ?Epilepsy Risk Factors:  There is a strong family history of seizures in her paternal grandfather, paternal aunt, nephew. Otherwise she had a normal birth and early development.  There is no history of febrile convulsions, CNS infections such as meningitis/encephalitis, significant traumatic brain injury, neurosurgical procedures. ?  ?Prior migraine preventative: Neurontin, Depakote, Topamax, Emgality ? ?PAST MEDICAL HISTORY: ?Past Medical History:  ?Diagnosis Date  ? Adenomatous polyp of colon 2008  ? Allergy   ? Anemia   ? Anxiety   ? Arthritis   ? Blood transfusion without reported diagnosis   ? following knee surgery   ? Cataract   ? hx- removed both eyes  ? Depression   ? Diabetes mellitus   ?  type II   ? Dyslipidemia   ? Fibromyalgia   ? Gallstones   ? Gastritis   ? GERD (gastroesophageal reflux disease)   ? Hemangioma   ? Hiatal hernia   ? Fibromyalgia  ? Hyperlipidemia   ? Hypertension   ? Hypothyroidism 2010  ? Dr.Deveshawr  ? IBS (irritable bowel syndrome)   ? Dr Fuller Plan  ? Migraines   ? Myocardial infarct ([place]) 2010  ? subendocardrial, following R TKR  ? Obesity   ? OSA (obstructive sleep apnea)   ?  cpap - does not know settings   ? Pneumonia   ? Seizure disorder ([place])   ? Seizures ([place])   ? last seizure was 2002 per pt 71  ? Sleep apnea   ? Urinary tract infection   ? ? ?MEDICATIONS: ?Current Outpatient Medications on File Prior to Visit  ?Medication Sig Dispense Refill  ? ACCU-CHEK GUIDE test strip USE UP TO FOUR TIMES DAILY AS DIRECTED 100 each 0  ? acetaminophen (TYLENOL) 500 MG tablet Take 1 tablet (500 mg total) by mouth every 6 (six) hours as needed. 30 tablet 3  ? acetaminophen-codeine (TYLENOL #3) 300-30 MG tablet TAKE 2 TABLETS BY MOUTH TWICE DAILY AS NEEDED FOR MODERATE PAIN 120 tablet 0  ? albuterol (VENTOLIN HFA) 108 (90 Base) MCG/ACT inhaler Inhale 2 puffs into the lungs every 6 (six) hours as needed for wheezing or shortness of breath. 8 g 5  ? blood glucose meter kit and supplies KIT Dispense based on patient and insurance preference. Use up to four times daily as directed. (FOR E11.9). 1 each 0  ? cholecalciferol (VITAMIN D3) 25 MCG (1000 UNIT) tablet Take 1,000 Units by mouth daily.    ? divalproex (DEPAKOTE) 250 MG DR tablet Take 1 tablet (250 mg total) by mouth 2 (two) times daily. 180 tablet 3  ? Galcanezumab-gnlm (EMGALITY) 120 MG/ML SOAJ Inject 120 mg into the skin every 28 (twenty-eight) days. 1.12 mL 1  ? glipiZIDE (GLUCOTROL) 10 MG tablet Take 1 tablet (10 mg total) by mouth 2 (two) times daily before a meal. 180 tablet 2  ? levothyroxine (SYNTHROID) 75 MCG tablet Take 1 tablet (75 mcg total) by mouth daily before breakfast. 90 tablet 2  ? loperamide (IMODIUM) 2 MG capsule Take 1 capsule (2 mg total) by mouth 4 (four) times daily as needed for diarrhea or loose stools. 12 capsule 0  ? losartan (COZAAR) 50 MG tablet Take 1 tablet (50 mg total) by mouth daily. 90 tablet 2  ? ondansetron (ZOFRAN-ODT) 4 MG disintegrating tablet Take 1 tablet (4 mg total) by mouth every 8 (eight) hours as needed for nausea or vomiting. 20 tablet 0  ? sertraline (ZOLOFT) 100 MG tablet Take 1.5 tablets  (150 mg total) by mouth daily. 135 tablet 1  ? simvastatin (ZOCOR) 20 MG tablet Take 1 tablet (20 mg total) by mouth at bedtime. 90 tablet 2  ? topiramate (TOPAMAX) 200 MG tablet TAKE 1 TABLET BY MOUTH TWICE DAILY 60 tablet 0  ? traZODone (DESYREL) 50 MG tablet TAKE 2 TABLETS BY MOUTH AT BEDTIME FOR SLEEP 180 tablet 0  ? ?No current facility-administered medications on file prior to visit.  ? ? ?ALLERGIES: ?Allergies  ?Allergen Reactions  ? Hydrocodone-Acetaminophen Itching  ?  Itches all over; without rash per pt  ? Metformin And Related Other (See Comments)  ?  GI  symptoms  ? ? ?FAMILY HISTORY: ?Family History  ?Problem Relation Age of Onset  ? Colon  polyps Mother   ? Ulcers Father   ? Colon polyps Brother   ? Esophageal cancer Brother   ? Diabetes Sister   ? Kidney failure Sister   ? Diabetes Paternal Aunt   ? Esophageal cancer Brother   ? Kidney cancer Sister   ? Renal cancer Sister   ? Colon cancer Neg Hx   ? Heart attack Neg Hx   ? Stroke Neg Hx   ? Cancer Neg Hx   ? Gallbladder disease Neg Hx   ? Rectal cancer Neg Hx   ? Stomach cancer Neg Hx   ? ? ?SOCIAL HISTORY: ?Social History  ? ?Socioeconomic History  ? Marital status: Widowed  ?  Spouse name: Not on file  ? Number of children: 3  ? Years of education: college  ? Highest education level: Not on file  ?Occupational History  ? Occupation: Retired  ?Tobacco Use  ? Smoking status: Never  ? Smokeless tobacco: Never  ?Vaping Use  ? Vaping Use: Never used  ?Substance and Sexual Activity  ? Alcohol use: No  ?  Alcohol/week: 0.0 standard drinks  ? Drug use: No  ? Sexual activity: Not Currently  ?Other Topics Concern  ? Not on file  ?Social History Narrative  ? Illicit drug use- no  ? Patient does not get regular exercise due to knee.  ? Patient lives at home alone.   ? Patient son and wife lives next door.  ? Patient has 3 children.   ? Patient has some college.   ? Patient retired May 2014.   ? ?Social Determinants of Health  ? ?Financial Resource Strain: Low  Risk   ? Difficulty of Paying Living Expenses: Not hard at all  ?Food Insecurity: No Food Insecurity  ? Worried About Charity fundraiser in the Last Year: Never true  ? Ran Out of Food in the Last Year:

## 2021-04-15 NOTE — Patient Instructions (Signed)
Always good to see you. ? ?Schedule 1-hour EEG. If normal, we will plan to start weaning off the Depakote '250mg'$ : take 1 tablet every night for 1 week, then 1 tablet every other night for 1 week, then stop. ? ?2. Continue Topiramate '200mg'$  twice a day ? ?3. We will try to get Botox approved to help with the migraines ? ?4. Follow-up in 4-5 months, call for any changes ? ? ?Seizure Precautions: ?1. If medication has been prescribed for you to prevent seizures, take it exactly as directed.  Do not stop taking the medicine without talking to your doctor first, even if you have not had a seizure in a long time.  ? ?2. Avoid activities in which a seizure would cause danger to yourself or to others.  Don't operate dangerous machinery, swim alone, or climb in high or dangerous places, such as on ladders, roofs, or girders.  Do not drive unless your doctor says you may. ? ?3. If you have any warning that you may have a seizure, lay down in a safe place where you can't hurt yourself.   ? ?4.  No driving for 6 months from last seizure, as per [facility].   Please refer to the following link on the [place] website for more information: http://[email or web address]/[email or web address]/drivingu.cfm  ? ?5.  Maintain good sleep hygiene. ? ?6.  Contact your doctor if you have any problems that may be related to the medicine you are taking. ? ?7.  Call 911 and bring the patient back to the ED if: ?      ? A.  The seizure lasts longer than 5 minutes.      ? B.  The patient doesn't awaken shortly after the seizure ? C.  The patient has new problems such as difficulty seeing, speaking or moving ? D.  The patient was injured during the seizure ? E.  The patient has a temperature over 102 F (39C) ? F.  The patient vomited and now is having trouble breathing ?      ? ?

## 2021-04-16 ENCOUNTER — Other Ambulatory Visit (HOSPITAL_COMMUNITY): Payer: Self-pay

## 2021-04-16 ENCOUNTER — Telehealth: Payer: Self-pay

## 2021-04-16 NOTE — Telephone Encounter (Addendum)
Patient Advocate Encounter ? ?Prior Authorization for Botox 200 units has been approved.   ? ?PA# [id number] ? ?Effective dates: 01/18/21 through 01/17/22 ? ?Per Test Claim Patients co-pay is $95.  ? ?Spoke with Pharmacy to Process. ? ?Patient Advocate ?Fax: [phone]  ?

## 2021-04-16 NOTE — Telephone Encounter (Signed)
Patient Advocate Encounter ?  ?Received notification from patient calls that prior authorization for Botox 200 unit solution is required by his/her insurance [employer]. ?  ?PA submitted on 04/16/21 ? ?Key#: [id number] ? ?Status is pending ?   ?[facility] will continue to follow: ? ?Patient Advocate ?Fax: [phone]  ?

## 2021-04-21 NOTE — Telephone Encounter (Signed)
Info given and Tylenol #3 processed. ? ?Spoke with Lethea Killings, pharmacist. ?

## 2021-04-23 ENCOUNTER — Telehealth: Payer: Self-pay | Admitting: Neurology

## 2021-04-23 MED ORDER — BOTOX 200 UNITS IJ SOLR: INTRAMUSCULAR | 4 refills | Status: DC

## 2021-04-23 NOTE — Telephone Encounter (Signed)
Patient called and stated she wanted to let Dr know she had gotten the approval for botox. ?

## 2021-04-23 NOTE — Telephone Encounter (Signed)
[facility], pt called given the number to [facility] to set up her account  ?

## 2021-04-27 ENCOUNTER — Ambulatory Visit: Payer: Medicare HMO | Admitting: Neurology

## 2021-04-27 ENCOUNTER — Telehealth: Payer: Self-pay | Admitting: Neurology

## 2021-04-27 DIAGNOSIS — G40309 Generalized idiopathic epilepsy and epileptic syndromes, not intractable, without status epilepticus: Secondary | ICD-10-CM

## 2021-04-27 NOTE — Telephone Encounter (Signed)
Questions answered, she will follow up with Dr. Tomi Likens as scheduled for botox.   ?

## 2021-04-27 NOTE — Telephone Encounter (Signed)
Patient called and left a voice mail requesting a call back.  ? ?She has questions about her Botox prescription. ?

## 2021-05-06 ENCOUNTER — Telehealth: Payer: Self-pay | Admitting: Neurology

## 2021-05-06 NOTE — Telephone Encounter (Signed)
Pt called in stating her specialty pharmacy is waiting on [place] to deliver botox. I didn't see an appointment scheduled yet. The pharmacy's phone number is [phone]. I didn't want to schedule a delivery without an appointment yet.   ?

## 2021-05-06 NOTE — Procedures (Signed)
ELECTROENCEPHALOGRAM REPORT ? ?Date of Study: 04/27/2021 ? ?Patient's Name: Alexis Grant ?MRN: [id number] ?Date of Birth: [birth year] ? ?Referring Provider: Dr. Ellouise Newer ? ?Clinical History: This is a 71 year old woman seizure-free since 2002 with worsening tremors on Depakote. EEG to help guide medication management. ? ?Medications: ?DEPAKOTE 250? MG DR tablet ?TOPAMAX 200 MG tablet ?TYLENOL 500 MG tablet ?TYLENOL #3 300-30 MG tablet ?VENTOLIN HFA 108 (90 Base) MCG/ACT inhaler ?VITAMIN D3 25 MCG (1000 UNIT) tablet ?EMGALITY 120 MG/ML SOAJ ?GLUCOTROL 10 MG tablet ?SYNTHROID 75 MCG tablet ?IMODIUM 2 MG capsule ?COZAAR 50 MG tablet ?ZOFRAN-ODT 4 MG disintegrating tablet ?ZOLOFT 100 MG tablet ?ZOCOR 20 MG tablet ?DESYREL 50 MG tablet ? ?Technical Summary: ?A multichannel digital 1-hour EEG recording measured by the international 10-20 system with electrodes applied with paste and impedances below 5000 ohms performed in our laboratory with EKG monitoring in an awake and asleep patient.  Hyperventilation was not performed. Photic stimulation was performed.  The digital EEG was referentially recorded, reformatted, and digitally filtered in a variety of bipolar and referential montages for optimal display.   ? ?Description: ?The patient is awake and asleep during the recording.  During maximal wakefulness, there is a symmetric, medium voltage 9-10 Hz posterior dominant rhythm that attenuates with eye opening.  The record is symmetric.  During drowsiness and sleep, there is an increase in theta slowing of the background.  Vertex waves and symmetric sleep spindles were seen.  Photic stimulation did not elicit any abnormalities.  There were no epileptiform discharges or electrographic seizures seen.   ? ?EKG lead was unremarkable. ? ?Impression: ?This 1-hour awake and asleep EEG is normal.   ? ?Clinical Correlation: ?A normal EEG does not exclude a clinical diagnosis of epilepsy.  If further clinical questions remain,  prolonged EEG may be helpful.  Clinical correlation is advised. ? ? ?Ellouise Newer, M.D. ? ?

## 2021-05-08 ENCOUNTER — Ambulatory Visit (INDEPENDENT_AMBULATORY_CARE_PROVIDER_SITE_OTHER): Payer: Medicare Other

## 2021-05-08 DIAGNOSIS — E1151 Type 2 diabetes mellitus with diabetic peripheral angiopathy without gangrene: Secondary | ICD-10-CM

## 2021-05-08 DIAGNOSIS — E039 Hypothyroidism, unspecified: Secondary | ICD-10-CM

## 2021-05-08 DIAGNOSIS — I10 Essential (primary) hypertension: Secondary | ICD-10-CM

## 2021-05-08 DIAGNOSIS — F3289 Other specified depressive episodes: Secondary | ICD-10-CM

## 2021-05-08 NOTE — Progress Notes (Signed)
? ?Chronic Care Management ?Pharmacy Note ? ?05/08/2021 ?Name:  CALEEN Grant MRN:  [id number] DOB:  [birth year] ? ?Summary: ?-Patient endorses compliance to current medications  ?-Is going to start weaning depakote per neurology today - will take 1 tablet daily x 1 week, then 1 tablet every other day x 1 week, then stop ?-Continues to work with neurology in regards to migraine treatment and prevention - will be starting botox injections in near future  ?-BG controlled at home averaging 125-130 ?-BP controlled in office with most recent visits- not currently checking BP at home  ? ?Recommendations/Changes made from today's visit: ?-Recommending no changes to medications at this time, would recommend for updated Tdap vaccine, and for patient to receive shingrix vaccine with next visit to pharmacy  ?- F/u in 6-8 months  ? ? ?Subjective: ?Alexis Grant is an 71 y.o. year old female who is a primary patient of Burns, Alexis Lick, MD.  The CCM team was consulted for assistance with disease management and care coordination needs.   ? ?Engaged with patient by telephone for follow up visit in response to provider referral for pharmacy case management and/or care coordination services.  ? ?Consent to Services:  ?The patient was given information about Chronic Care Management services, agreed to services, and gave verbal consent prior to initiation of services.  Please see initial visit note for detailed documentation.  ? ?Patient Care Team: ?Alexis Rail, MD as PCP - General (Internal Medicine) ?Alexis Sprang, MD as Consulting Physician (Neurology) ?Alexis Balding, MD as Consulting Physician (Orthopedic Surgery) ?Alexis Mires, MD as Consulting Physician (Pulmonary Disease) ?Alexis Fam, MD as Consulting Physician (Ophthalmology) ?Alexis Sprang, MD as Consulting Physician (Neurology) ?Foltanski, Cleaster Corin, [facility] as Pharmacist (Pharmacist) ? ?  ?Pt has lived in [place] last 20 years. Born in [place], lived in  [place] as well. Husband was in shopping center business ([employer]). Kids were raised in [place]. Husband passed away this year. 3 children - 2 sons, 1 daughter in [place]. Last job in [employer], retired from there.  ? ?Recent office visits: ?02/04/2021 - Dr. Quay Burow - no changes to medications - f/u in 6 months  ?  ?Recent consult visits: ?04/15/2021 - Dr. [name] - Neurology - schedule 1 hour EEG - if normal - plan to start weaning off depakote, botox ordered for migraine control - f/u in 4-5 months   ? ?Hospital visits: ?08/11/19 - 08/15/19 Hospital admission: N/V, diarrhea unclear cause, improved with IVF. B/l leg edema unclear cause, ECHO normal EF, oral lasix did not improve. Rec'd home PT. Hold gabapentin d/t leg swelling. ? ?Objective: ? ?Lab Results  ?Component Value Date  ? CREATININE 0.72 02/04/2021  ? BUN 9 02/04/2021  ? GFR 84.86 02/04/2021  ? GFRNONAA 96 08/22/2019  ? GFRAA 111 08/22/2019  ? NA 141 02/04/2021  ? K 4.3 02/04/2021  ? CALCIUM 9.5 02/04/2021  ? CO2 28 02/04/2021  ? ? ?Lab Results  ?Component Value Date/Time  ? HGBA1C 7.1 (H) 02/04/2021 02:43 PM  ? HGBA1C 5.7 04/18/2020 02:59 PM  ? GFR 84.86 02/04/2021 02:43 PM  ? GFR 90.89 04/18/2020 02:59 PM  ? MICROALBUR 4.2 (H) 11/25/2015 09:39 AM  ? MICROALBUR 2.0 (H) 10/01/2014 12:11 PM  ?  ?Last diabetic Eye exam:  ?Lab Results  ?Component Value Date/Time  ? HMDIABEYEEXA No Retinopathy 11/14/2019 11:43 AM  ?  ?Last diabetic Foot exam: No results found for: HMDIABFOOTEX  ? ?Lab Results  ?Component  Value Date  ? CHOL 159 02/04/2021  ? HDL 52.90 02/04/2021  ? [place] 81 02/04/2021  ? TRIG 123.0 02/04/2021  ? CHOLHDL 3 02/04/2021  ? ? ? ?  Latest Ref Rng & Units 02/04/2021  ?  2:43 PM 04/18/2020  ?  2:59 PM 08/12/2019  ?  3:55 AM  ?Hepatic Function  ?Total Protein 6.0 - 8.3 g/dL 6.8   6.4   5.7    ?Albumin 3.5 - 5.2 g/dL 3.9   3.8   3.0    ?AST 0 - 37 U/L '7   9   9    ' ?ALT 0 - 35 U/L '6   7   8    ' ?Alk Phosphatase 39 - 117 U/L 57   53   37     ?Total Bilirubin 0.2 - 1.2 mg/dL 0.3   0.4   0.3    ? ? ?Lab Results  ?Component Value Date/Time  ? TSH 2.75 02/04/2021 02:43 PM  ? TSH 1.09 04/18/2020 02:59 PM  ? FREET4 0.5 (L) 01/28/2006 01:34 PM  ? ? ? ?  Latest Ref Rng & Units 02/04/2021  ?  2:43 PM 04/18/2020  ?  2:59 PM 08/22/2019  ?  2:12 PM  ?CBC  ?WBC 4.0 - 10.5 K/uL 7.0   6.8   6.1    ?Hemoglobin 12.0 - 15.0 g/dL 12.8   12.1   11.9    ?Hematocrit 36.0 - 46.0 % 39.3   36.3   37.2    ?Platelets 150.0 - 400.0 K/uL 212.0   195.0   242    ? ? ?Lab Results  ?Component Value Date/Time  ? VD25OH 40.39 02/04/2021 02:43 PM  ? VD25OH <7.00 (L) 04/18/2020 02:59 PM  ? ? ?Clinical ASCVD: Yes  ?The ASCVD Risk score (Arnett DK, et al., 2019) failed to calculate for the following reasons: ?  The patient has a prior MI or stroke diagnosis   ? ? ?  01/27/2021  ?  2:06 PM 11/27/2019  ? 11:02 AM 11/22/2018  ? 11:20 AM  ?Depression screen PHQ 2/9  ?Decreased Interest 0 0 0  ?Down, Depressed, Hopeless '3 1 1  ' ?PHQ - 2 Score '3 1 1  ' ?Altered sleeping   3  ?Tired, decreased energy   2  ?Change in appetite   1  ?Feeling bad or failure about yourself    0  ?Trouble concentrating   0  ?Moving slowly or fidgety/restless   0  ?Suicidal thoughts   0  ?PHQ-9 Score   7  ?Difficult doing work/chores   Not difficult at all  ?  ? ?Social History  ? ?Tobacco Use  ?Smoking Status Never  ?Smokeless Tobacco Never  ? ?BP Readings from Last 3 Encounters:  ?04/15/21 138/78  ?02/04/21 116/78  ?07/07/20 (!) 145/63  ? ?Pulse Readings from Last 3 Encounters:  ?04/15/21 90  ?02/04/21 (!) 105  ?07/07/20 66  ? ?Wt Readings from Last 3 Encounters:  ?04/15/21 (!) 311 lb 12.8 oz (141.4 kg)  ?02/04/21 (!) 310 lb 3.2 oz (140.7 kg)  ?10/13/20 283 lb (128.4 kg)  ? ?BMI Readings from Last 3 Encounters:  ?04/15/21 55.23 kg/m?  ?02/04/21 54.95 kg/m?  ?10/13/20 50.13 kg/m?  ? ? ?Assessment/Interventions: Review of patient past medical history, allergies, medications, health status, including review of consultants reports,  laboratory and other test data, was performed as part of comprehensive evaluation and provision of chronic care management services.  ? ?SDOH:  (Social Determinants of  Health) assessments and interventions performed: Yes ? ?SDOH Screenings  ? ?Alcohol Screen: Low Risk   ? Last Alcohol Screening Score (AUDIT): 0  ?Depression (PHQ2-9): Low Risk   ? PHQ-2 Score: 3  ?Financial Resource Strain: Low Risk   ? Difficulty of Paying Living Expenses: Not hard at all  ?Food Insecurity: No Food Insecurity  ? Worried About Charity fundraiser in the Last Year: Never true  ? Ran Out of Food in the Last Year: Never true  ?Housing: Low Risk   ? Last Housing Risk Score: 0  ?Physical Activity: Inactive  ? Days of Exercise per Week: 0 days  ? Minutes of Exercise per Session: 0 min  ?Social Connections: Socially Isolated  ? Frequency of Communication with Friends and Family: More than three times a week  ? Frequency of Social Gatherings with Friends and Family: Once a week  ? Attends Religious Services: Never  ? Active Member of Clubs or Organizations: No  ? Attends Archivist Meetings: Never  ? Marital Status: Widowed  ?Stress: Stress Concern Present  ? Feeling of Stress : Rather much  ?Tobacco Use: Low Risk   ? Smoking Tobacco Use: Never  ? Smokeless Tobacco Use: Never  ? Passive Exposure: Not on file  ?Transportation Needs: No Transportation Needs  ? Lack of Transportation (Medical): No  ? Lack of Transportation (Non-Medical): No  ? ? ? ?CCM Care Plan ? ?Allergies  ?Allergen Reactions  ? Hydrocodone-Acetaminophen Itching  ?  Itches all over; without rash per pt  ? Metformin And Related Other (See Comments)  ?  GI  symptoms  ? ? ?Medications Reviewed Today   ? ? Reviewed by Tomasa Blase, [facility] (Pharmacist) on 05/08/21 at 1307  Med List Status: <None>  ? ?Medication Order Taking? Sig Documenting Provider Last Dose Status Informant  ?ACCU-CHEK GUIDE test strip [id number] Yes USE UP TO FOUR TIMES DAILY AS DIRECTED Burns, Alexis Lick, MD Taking Active   ?acetaminophen (TYLENOL) 500 MG tablet [id number] Yes Take 1 tablet (500 mg total) by mouth every 6 (six) hours as needed. Alexis Rail, MD Taking Active   ?acetaminophen-codeine (TYLENOL #3)

## 2021-05-08 NOTE — Telephone Encounter (Signed)
Leann from [facility] called to set up delivery of Botox and confirm some clinical details. ?

## 2021-05-08 NOTE — Patient Instructions (Signed)
Visit Information ? ?Following are the goals we discussed today:  ? ?Manage My Medicine  ? ?Timeframe:  Long-Range Goal ?Priority:  Medium ?Start Date:     09/04/20                        ?Expected End Date: 09/04/21                     ? ?Follow Up Date Feb 2022 ?  ?- call for medicine refill 2 or 3 days before it runs out ?- call if I am sick and can't take my medicine ?- keep a list of all the medicines I take; vitamins and herbals too ?  ?Why is this important?   ?These steps will help you keep on track with your medicines. ? ?Plan: Telephone follow up appointment with care management team member scheduled for:  6 months  ?The patient has been provided with contact information for the care management team and has been advised to call with any health related questions or concerns.  ? ?Tomasa Blase, PharmD ?Clinical Pharmacist, [facility]  ? ?Please call the care guide team at [phone] if you need to cancel or reschedule your appointment.  ? ?Patient verbalizes understanding of instructions and care plan provided today and agrees to view in [place]. Active MyChart status confirmed with patient.   ? ?

## 2021-05-14 ENCOUNTER — Other Ambulatory Visit: Payer: Self-pay | Admitting: Internal Medicine

## 2021-05-17 DIAGNOSIS — E1151 Type 2 diabetes mellitus with diabetic peripheral angiopathy without gangrene: Secondary | ICD-10-CM

## 2021-05-17 DIAGNOSIS — F3289 Other specified depressive episodes: Secondary | ICD-10-CM

## 2021-05-17 DIAGNOSIS — I10 Essential (primary) hypertension: Secondary | ICD-10-CM

## 2021-05-18 ENCOUNTER — Other Ambulatory Visit: Payer: Self-pay | Admitting: Internal Medicine

## 2021-05-28 ENCOUNTER — Encounter: Payer: Self-pay | Admitting: Internal Medicine

## 2021-05-28 ENCOUNTER — Telehealth (INDEPENDENT_AMBULATORY_CARE_PROVIDER_SITE_OTHER): Payer: Medicare HMO | Admitting: Internal Medicine

## 2021-05-28 DIAGNOSIS — E1151 Type 2 diabetes mellitus with diabetic peripheral angiopathy without gangrene: Secondary | ICD-10-CM | POA: Diagnosis not present

## 2021-05-28 MED ORDER — OZEMPIC (0.25 OR 0.5 MG/DOSE) 2 MG/1.5ML ~~LOC~~ SOPN: PEN_INJECTOR | SUBCUTANEOUS | 0 refills | Status: DC

## 2021-05-28 NOTE — Progress Notes (Signed)
Virtual Visit via Video Note ? ?I connected with Alexis Grant on 05/28/21 at 10:40 AM EDT by a video enabled telemedicine application and verified that I am speaking with the correct person using two identifiers. ?  ?I discussed the limitations of evaluation and management by telemedicine and the availability of in person appointments. The patient expressed understanding and agreed to proceed. ? ?Present for the visit:  Myself, Dr Billey Gosling, Alexis Grant.  The patient is currently at home and I am in the office.   ? ?No referring provider.  ? ? ?History of Present Illness: ?This is an acute visit for difficulty regulating sugars ? ?Sugars have been elevated x 2-3 weeks.  It was 215 this am.  She has been watching what she eats.  During the day it may be 95-120.  At night/evening it is 165 ish.  She is an active secondary to chronic severe hip pain from osteoarthritis. ? ? ?Taking glipizide 10 mg bid.  She is taking medication daily as prescribed. ? ? ? ?Lab Results  ?Component Value Date  ? HGBA1C 7.1 (H) 02/04/2021  ? ? ?Feels funny, has headaches, gets dizzy and sweats when sugars are high. ? ? ?She has not tolerated metformin in the past or Jardiance. ? ? ?Social History  ? ?Socioeconomic History  ? Marital status: Widowed  ?  Spouse name: Not on file  ? Number of children: 3  ? Years of education: college  ? Highest education level: Not on file  ?Occupational History  ? Occupation: Retired  ?Tobacco Use  ? Smoking status: Never  ? Smokeless tobacco: Never  ?Vaping Use  ? Vaping Use: Never used  ?Substance and Sexual Activity  ? Alcohol use: No  ?  Alcohol/week: 0.0 standard drinks  ? Drug use: No  ? Sexual activity: Not Currently  ?Other Topics Concern  ? Not on file  ?Social History Narrative  ? Illicit drug use- no  ? Patient does not get regular exercise due to knee.  ? Patient lives at home alone.   ? Patient son and wife lives next door.  ? Patient has 3 children.   ? Patient has some college.   ?  Patient retired May 2014.   ? ?Social Determinants of Health  ? ?Financial Resource Strain: Low Risk   ? Difficulty of Paying Living Expenses: Not hard at all  ?Food Insecurity: No Food Insecurity  ? Worried About Charity fundraiser in the Last Year: Never true  ? Ran Out of Food in the Last Year: Never true  ?Transportation Needs: No Transportation Needs  ? Lack of Transportation (Medical): No  ? Lack of Transportation (Non-Medical): No  ?Physical Activity: Inactive  ? Days of Exercise per Week: 0 days  ? Minutes of Exercise per Session: 0 min  ?Stress: Stress Concern Present  ? Feeling of Stress : Rather much  ?Social Connections: Socially Isolated  ? Frequency of Communication with Friends and Family: More than three times a week  ? Frequency of Social Gatherings with Friends and Family: Once a week  ? Attends Religious Services: Never  ? Active Member of Clubs or Organizations: No  ? Attends Archivist Meetings: Never  ? Marital Status: Widowed  ? ?  ?Observations/Objective: ?Appears well in NAD ? ? ?Assessment and Plan: ? ?See Problem List for Assessment and Plan of chronic medical problems. ? ? ?Follow Up Instructions: ? ?  ?I discussed the assessment and treatment plan with  the patient. The patient was provided an opportunity to ask questions and all were answered. The patient agreed with the plan and demonstrated an understanding of the instructions. ?  ?The patient was advised to call back or seek an in-person evaluation if the symptoms worsen or if the condition fails to improve as anticipated. ? ? ? ?Binnie Rail, MD ? ?

## 2021-05-28 NOTE — Assessment & Plan Note (Signed)
Chronic ?Sugars not ideally controlled at home and she is symptomatic when they are elevated ?Start Ozempic 0.25 mg subcutaneously weekly x4 weeks, then increase to Ozempic 0.5 mg weekly ?Discussed possible side effects ?Continue glipizide 10 mg twice daily for now-discussed the importance of monitoring her sugars closely and that when she starts taking the Ozempic she has been more at risk for low sugars and we will need to decrease the glipizide.  Goal is to get her off of that eventually ?Hopefully this will help with weight loss which will also be beneficial for her hip pain ?Continue diabetic diet ?

## 2021-06-02 NOTE — Telephone Encounter (Signed)
Called and left message for patient to call back to get her schedule for Botox with Dr Tomi Likens  ?

## 2021-06-19 NOTE — Telephone Encounter (Signed)
Telephone call to [facility],To set up delivery for Botox.  Per Rep The Botox was placed on hold by someone in April.  Advised that she is due for Process the Script. Patient has an Appt on 07/03/21. Please set date for Delivery no later then 06/30/21 to make sure patient is able to keep her appt.   Conlan the Rep will call the patient to get her started again.

## 2021-06-22 NOTE — Telephone Encounter (Signed)
Telephone call to [facility], Per rep They have tried on Multiple attempts to get the patient on Consent not answer.  Telephone call to patient, she advised [facility] when she scheduled the appt to go ahead and send Botox not put it on hold. Patient will call today to give consent.

## 2021-06-24 NOTE — Telephone Encounter (Signed)
Spoke to patient, Advised by [employer], Botox will be shipped.   Spoke to [place] at [facility] delivery set for 06/25/21

## 2021-07-01 ENCOUNTER — Other Ambulatory Visit: Payer: Self-pay | Admitting: Internal Medicine

## 2021-07-03 ENCOUNTER — Ambulatory Visit: Payer: Medicare HMO | Admitting: Neurology

## 2021-07-03 DIAGNOSIS — G43109 Migraine with aura, not intractable, without status migrainosus: Secondary | ICD-10-CM | POA: Diagnosis not present

## 2021-07-03 NOTE — Progress Notes (Signed)
[facility]   Procedure Note Botox  Attending: Dr. Metta Clines  Preoperative Diagnosis(es): Chronic migraine  Consent obtained from: The patient Benefits discussed included, but were not limited to decreased muscle tightness, increased joint range of motion, and decreased pain.  Risk discussed included, but were not limited pain and discomfort, bleeding, bruising, excessive weakness, venous thrombosis, muscle atrophy and dysphagia.  Anticipated outcomes of the procedure as well as he risks and benefits of the alternatives to the procedure, and the roles and tasks of the personnel to be involved, were discussed with the patient, and the patient consents to the procedure and agrees to proceed. A copy of the patient medication guide was given to the patient which explains the blackbox warning.  Patients identity and treatment sites confirmed Yes.  .  Details of Procedure: Skin was cleaned with alcohol. Prior to injection, the needle plunger was aspirated to make sure the needle was not within a blood vessel.  There was no blood retrieved on aspiration.    Following is a summary of the muscles injected  And the amount of Botulinum toxin used:  Dilution 200 units of Botox was reconstituted with 4 ml of preservative free normal saline. Time of reconstitution: At the time of the office visit (<30 minutes prior to injection)   Injections  155 total units of Botox was injected with a 30 gauge needle.  Injection Sites: L occipitalis: 15 units- 3 sites  R occiptalis: 15 units- 3 sites  L upper trapezius: 15 units- 3 sites R upper trapezius: 15 units- 3 sits          L paraspinal: 10 units- 2 sites R paraspinal: 10 units- 2 sites  Face L frontalis(2 injection sites):10 units   R frontalis(2 injection sites):10 units         L corrugator: 5 units   R corrugator: 5 units           Procerus: 5 units   L temporalis: 20 units R temporalis: 20 units   Agent:  200 units of botulinum Type  A (Onobotulinum Toxin type A) was reconstituted with 4 ml of preservative free normal saline.  Time of reconstitution: At the time of the office visit (<30 minutes prior to injection)     Total injected (Units):  155  Total wasted (Units):  10  Patient tolerated procedure well without complications.   Reinjection is anticipated in 3 months.

## 2021-07-28 ENCOUNTER — Other Ambulatory Visit: Payer: Self-pay | Admitting: Internal Medicine

## 2021-07-28 ENCOUNTER — Telehealth: Payer: Medicare HMO | Admitting: Neurology

## 2021-07-28 ENCOUNTER — Encounter: Payer: Self-pay | Admitting: Neurology

## 2021-07-28 VITALS — Ht 63.0 in

## 2021-07-28 DIAGNOSIS — G43109 Migraine with aura, not intractable, without status migrainosus: Secondary | ICD-10-CM | POA: Diagnosis not present

## 2021-07-28 DIAGNOSIS — G40309 Generalized idiopathic epilepsy and epileptic syndromes, not intractable, without status epilepticus: Secondary | ICD-10-CM | POA: Diagnosis not present

## 2021-07-28 DIAGNOSIS — R251 Tremor, unspecified: Secondary | ICD-10-CM

## 2021-07-28 NOTE — Patient Instructions (Signed)
Good to see you doing better!  Continue Topiramate '200mg'$  twice a day  2. Continue Botox  3. Follow-up in 6 months, call for any changes   Seizure Precautions: 1. If medication has been prescribed for you to prevent seizures, take it exactly as directed.  Do not stop taking the medicine without talking to your doctor first, even if you have not had a seizure in a long time.   2. Avoid activities in which a seizure would cause danger to yourself or to others.  Don't operate dangerous machinery, swim alone, or climb in high or dangerous places, such as on ladders, roofs, or girders.  Do not drive unless your doctor says you may.  3. If you have any warning that you may have a seizure, lay down in a safe place where you can't hurt yourself.    4.  No driving for 6 months from last seizure, as per [facility].   Please refer to the following link on the [place] website for more information: http://[email or web address]/[email or web address]/drivingu.cfm   5.  Maintain good sleep hygiene. Avoid alcohol.  6.  Contact your doctor if you have any problems that may be related to the medicine you are taking.  7.  Call 911 and bring the patient back to the ED if:        A.  The seizure lasts longer than 5 minutes.       B.  The patient doesn't awaken shortly after the seizure  C.  The patient has new problems such as difficulty seeing, speaking or moving  D.  The patient was injured during the seizure  E.  The patient has a temperature over 102 F (39C)  F.  The patient vomited and now is having trouble breathing

## 2021-07-28 NOTE — Progress Notes (Signed)
Virtual Visit via Video Note The purpose of this virtual visit is to provide medical care while limiting exposure to the novel coronavirus.    Consent was obtained for video visit:  Yes.   Answered questions that patient had about telehealth interaction:  Yes.   I discussed the limitations, risks, security and privacy concerns of performing an evaluation and management service by telemedicine. I also discussed with the patient that there may be a patient responsible charge related to this service. The patient expressed understanding and agreed to proceed.  Pt location: Home Physician Location: office Name of referring provider:  Binnie Rail, MD I connected with Alexis Grant at patients initiation/request on 07/28/2021 at  2:00 PM EDT by video enabled telemedicine application and verified that I am speaking with the correct person using two identifiers. Pt MRN:  [id number] Pt DOB:  10-01-1950 Video Participants:  Alexis Grant   History of Present Illness:  The patient had a virtual video visit on 07/28/2021. She was last seen in the neurology clinic 7 months ago for seizures, migraines, and tremors. Since her last visit, she had a 1-hour EEG in 04/2021 which was normal, Depakote was weaned off. She has been off Depakote for 2.5 months now and has been doing well with no seizures or seizure-like symptoms since 2002. She denies any staring/unresponsive episodes, myoclonic jerks. The tremors are not too bad, when she does more activities, she starts shaking quite a bit, but if she is just sitting and calm, she is okay. She continues on Topiramate 273m BID. She had her first Botox session last month and reports that it is a miracle drug for her. She had only 1 headache a week after, but since then has been headache-free, which is a significant improvement from having headaches 3-4 days a week. She denies any further facial numbness or vision changes. No falls.    History on Initial  Assessment 11/28/2014: This is a pleasant 71yo RH woman with a history of hypertension, hyperlipidemia, diabetes, obesity, sleep apnea, CAD s/p MI, with seizures and migraines. She reports seizures started at age 71 she woke up in the hospital with no prior warning symptoms. Since then, she has had 4 generalized tonic-clonic seizures, last was in 2002. She also report 5 or 6 "little episodes" where she feels dizzy, with blurred vision, then if she sits down quickly and tries to relax, she can avoid any progression. She would feel briefly confused. No associated olfactory/gustatory hallucinations, focal numbness/tingling/weakness, myoclonic jerks. The last time she had these episodes was at least 12 years ago. She recalls taking Neurontin initially, which did not help. Topamax in the past which caused GI symptoms. She has been taking Depakote ER 5044min AM, 100032mn PM for many years now with no side effects. She reports having brain imaging and EEG in the past which were normal, no records available for review.    She reports migraines started at age [age]he has an aura of numbness in her face, cheeks, and temple, then she becomes very sensitive to lights, sounds, and smells. She sees flashing lights then her vision becomes blurred, followed by throbbing headaches in the frontal or occipital regions with associated nausea. No associated focal numbness/tingling/weakness in the extremities. She tried Imitrex with minimal effect. A course of Prednisone   She has chronic neck pain and has been dealing with diarrhea from IBS. She had an MI in 2010.    Epilepsy Risk Factors:  There is a strong family history of seizures in her paternal grandfather, paternal aunt, nephew. Otherwise she had a normal birth and early development.  There is no history of febrile convulsions, CNS infections such as meningitis/encephalitis, significant traumatic brain injury, neurosurgical procedures.   Prior migraine preventative:  Neurontin, Depakote, Topamax, Emgality Current Outpatient Medications on File Prior to Visit  Medication Sig Dispense Refill   ACCU-CHEK GUIDE test strip USE UP TO FOUR TIMES DAILY AS DIRECTED 100 each 0   acetaminophen (TYLENOL) 500 MG tablet Take 1 tablet (500 mg total) by mouth every 6 (six) hours as needed. 30 tablet 3   acetaminophen-codeine (TYLENOL #3) 300-30 MG tablet TAKE 2 TABLETS TWICE DAILY AS NEEDED FOR MODERATE PAIN 120 tablet 0   albuterol (VENTOLIN HFA) 108 (90 Base) MCG/ACT inhaler Inhale 2 puffs into the lungs every 6 (six) hours as needed for wheezing or shortness of breath. 8 g 5   blood glucose meter kit and supplies KIT Dispense based on patient and insurance preference. Use up to four times daily as directed. (FOR E11.9). 1 each 0   Botulinum Toxin Type A (BOTOX) 200 units SOLR Inject 155 units IM into multiple site in the face,neck and head once every 90 days 1 each 4   cholecalciferol (VITAMIN D3) 25 MCG (1000 UNIT) tablet Take 2,000 Units by mouth daily.     glipiZIDE (GLUCOTROL) 10 MG tablet Take 1 tablet (10 mg total) by mouth 2 (two) times daily before a meal. 180 tablet 2   levothyroxine (SYNTHROID) 75 MCG tablet Take 1 tablet (75 mcg total) by mouth daily before breakfast. 90 tablet 2   loperamide (IMODIUM) 2 MG capsule Take 1 capsule (2 mg total) by mouth 4 (four) times daily as needed for diarrhea or loose stools. 12 capsule 0   losartan (COZAAR) 50 MG tablet Take 1 tablet (50 mg total) by mouth daily. 90 tablet 2   ondansetron (ZOFRAN-ODT) 4 MG disintegrating tablet Take 1 tablet (4 mg total) by mouth every 8 (eight) hours as needed for nausea or vomiting. 20 tablet 0   Semaglutide,0.25 or 0.5MG/DOS, (OZEMPIC, 0.25 OR 0.5 MG/DOSE,) 2 MG/3ML SOPN INJECT 0.25MG UNDER THE SKIN ONE TIME WEEKLY FOR 4 WEEKS, THEN INCREASE TO 0.5MG WEEKLY 9 mL 0   sertraline (ZOLOFT) 100 MG tablet Take 1.5 tablets (150 mg total) by mouth daily. 135 tablet 1   simvastatin (ZOCOR) 20 MG  tablet Take 1 tablet (20 mg total) by mouth at bedtime. 90 tablet 2   topiramate (TOPAMAX) 200 MG tablet TAKE 1 TABLET BY MOUTH TWICE DAILY 180 tablet 3   traZODone (DESYREL) 50 MG tablet TAKE 2 TABLETS BY MOUTH AT BEDTIME FOR SLEEP 180 tablet 0   No current facility-administered medications on file prior to visit.     Observations/Objective:   Vitals:   07/28/21 1343  Height: '5\' 3"'  (1.6 m)   GEN:  The patient appears stated age and is in NAD.  Neurological examination: Patient is awake, alert. No aphasia or dysarthria. Intact fluency and comprehension. Cranial nerves: Extraocular movements intact with no nystagmus. No facial asymmetry. Motor: moves all extremities symmetrically, at least anti-gravity x 4.    Assessment and Plan:   This is a pleasant 71 yo RH woman with a history of hypertension, hyperlipidemia, obesity, sleep apnea on CPAP, CAD s/p MI, intractable migraine with aura, convulsive seizures, and tremors. She has been seizure-free since 2002, and has weaned off Depakote last April 2023, continues on Topiramate 241m  BID for seizure and migraine prophylaxis. She has had significant improvement in migraines with Botox, continue Botox with Dr. Tomi Likens. Tremors appear to have improved as well off Depakote. She does not drive. Follow-up in 6 months, call for any changes.    Follow Up Instructions:    -I discussed the assessment and treatment plan with the patient. The patient was provided an opportunity to ask questions and all were answered. The patient agreed with the plan and demonstrated an understanding of the instructions.   The patient was advised to call back or seek an in-person evaluation if the symptoms worsen or if the condition fails to improve as anticipated.     Cameron Sprang, MD

## 2021-08-05 ENCOUNTER — Ambulatory Visit: Payer: Medicare HMO | Admitting: Internal Medicine

## 2021-08-06 ENCOUNTER — Other Ambulatory Visit: Payer: Self-pay | Admitting: Internal Medicine

## 2021-08-07 ENCOUNTER — Telehealth: Payer: Medicare HMO

## 2021-08-10 ENCOUNTER — Emergency Department (HOSPITAL_COMMUNITY): Payer: Medicare HMO

## 2021-08-10 ENCOUNTER — Encounter (HOSPITAL_COMMUNITY): Payer: Self-pay

## 2021-08-10 ENCOUNTER — Emergency Department (HOSPITAL_COMMUNITY)
Admission: EM | Admit: 2021-08-10 | Discharge: 2021-08-10 | Disposition: A | Discharge status: Home / Self Care | Payer: Medicare HMO | Attending: Emergency Medicine | Admitting: Emergency Medicine

## 2021-08-10 DIAGNOSIS — D1803 Hemangioma of intra-abdominal structures: Secondary | ICD-10-CM | POA: Diagnosis not present

## 2021-08-10 DIAGNOSIS — Z794 Long term (current) use of insulin: Secondary | ICD-10-CM | POA: Insufficient documentation

## 2021-08-10 DIAGNOSIS — Z7984 Long term (current) use of oral hypoglycemic drugs: Secondary | ICD-10-CM | POA: Insufficient documentation

## 2021-08-10 DIAGNOSIS — Z79899 Other long term (current) drug therapy: Secondary | ICD-10-CM | POA: Insufficient documentation

## 2021-08-10 DIAGNOSIS — E1165 Type 2 diabetes mellitus with hyperglycemia: Secondary | ICD-10-CM | POA: Diagnosis not present

## 2021-08-10 DIAGNOSIS — K5900 Constipation, unspecified: Secondary | ICD-10-CM | POA: Insufficient documentation

## 2021-08-10 DIAGNOSIS — I10 Essential (primary) hypertension: Secondary | ICD-10-CM | POA: Insufficient documentation

## 2021-08-10 DIAGNOSIS — R1084 Generalized abdominal pain: Secondary | ICD-10-CM | POA: Diagnosis present

## 2021-08-10 DIAGNOSIS — R531 Weakness: Secondary | ICD-10-CM | POA: Diagnosis not present

## 2021-08-10 DIAGNOSIS — N281 Cyst of kidney, acquired: Secondary | ICD-10-CM | POA: Diagnosis not present

## 2021-08-10 LAB — CBC WITH DIFFERENTIAL/PLATELET
Abs Immature Granulocytes: 0.08 10*3/uL — ABNORMAL HIGH (ref 0.00–0.07)
Basophils Absolute: 0 10*3/uL (ref 0.0–0.1)
Basophils Relative: 0 %
Eosinophils Absolute: 0 10*3/uL (ref 0.0–0.5)
Eosinophils Relative: 0 %
HCT: 40 % (ref 36.0–46.0)
Hemoglobin: 13.1 g/dL (ref 12.0–15.0)
Immature Granulocytes: 1 %
Lymphocytes Relative: 13 %
Lymphs Abs: 1.3 10*3/uL (ref 0.7–4.0)
MCH: 27.8 pg (ref 26.0–34.0)
MCHC: 32.8 g/dL (ref 30.0–36.0)
MCV: 84.9 fL (ref 80.0–100.0)
Monocytes Absolute: 0.6 10*3/uL (ref 0.1–1.0)
Monocytes Relative: 6 %
Neutro Abs: 8.1 10*3/uL — ABNORMAL HIGH (ref 1.7–7.7)
Neutrophils Relative %: 80 %
Platelets: 211 10*3/uL (ref 150–400)
RBC: 4.71 MIL/uL (ref 3.87–5.11)
RDW: 15.8 % — ABNORMAL HIGH (ref 11.5–15.5)
WBC: 10 10*3/uL (ref 4.0–10.5)
nRBC: 0 % (ref 0.0–0.2)

## 2021-08-10 LAB — COMPREHENSIVE METABOLIC PANEL
ALT: 11 U/L (ref 0–44)
AST: 10 U/L — ABNORMAL LOW (ref 15–41)
Albumin: 3.3 g/dL — ABNORMAL LOW (ref 3.5–5.0)
Alkaline Phosphatase: 55 U/L (ref 38–126)
Anion gap: 11 (ref 5–15)
BUN: 11 mg/dL (ref 8–23)
CO2: 20 mmol/L — ABNORMAL LOW (ref 22–32)
Calcium: 8.9 mg/dL (ref 8.9–10.3)
Chloride: 109 mmol/L (ref 98–111)
Creatinine, Ser: 0.66 mg/dL (ref 0.44–1.00)
GFR, Estimated: >60 mL/min (ref >60)
Glucose, Bld: 192 mg/dL — ABNORMAL HIGH (ref 70–99)
Potassium: 3 mmol/L — ABNORMAL LOW (ref 3.5–5.1)
Sodium: 140 mmol/L (ref 135–145)
Total Bilirubin: 0.9 mg/dL (ref 0.3–1.2)
Total Protein: 6.8 g/dL (ref 6.5–8.1)

## 2021-08-10 LAB — URINALYSIS, ROUTINE W REFLEX MICROSCOPIC
Bilirubin Urine: NEGATIVE
Glucose, UA: NEGATIVE mg/dL
Hgb urine dipstick: NEGATIVE
Ketones, ur: 80 mg/dL — AB
Leukocytes,Ua: NEGATIVE
Nitrite: NEGATIVE
Protein, ur: NEGATIVE mg/dL
Specific Gravity, Urine: <=1.005 (ref 1.005–1.030)
pH: 5 (ref 5.0–8.0)

## 2021-08-10 MED ORDER — SODIUM CHLORIDE 0.9 % IV BOLUS
500.0000 mL | Freq: Once | INTRAVENOUS | Status: AC
  Administered 2021-08-10: 500 mL via INTRAVENOUS

## 2021-08-10 MED ORDER — METAMUCIL 0.52 G PO CAPS: 0.5200 g | ORAL_CAPSULE | Freq: Every day | ORAL | 1 refills | Status: AC

## 2021-08-10 MED ORDER — HYDROMORPHONE HCL 1 MG/ML IJ SOLN
0.5000 mg | Freq: Once | INTRAMUSCULAR | Status: AC
  Administered 2021-08-10: 0.5 mg via INTRAVENOUS
  Filled 2021-08-10: qty 1

## 2021-08-10 MED ORDER — IOHEXOL 300 MG/ML  SOLN
100.0000 mL | Freq: Once | INTRAMUSCULAR | Status: AC | PRN
  Administered 2021-08-10: 100 mL via INTRAVENOUS

## 2021-08-10 MED ORDER — SODIUM CHLORIDE (PF) 0.9 % IJ SOLN
INTRAMUSCULAR | Status: AC
  Filled 2021-08-10: qty 50

## 2021-08-10 NOTE — ED Provider Notes (Signed)
[facility] DEPT Provider Note   CSN: [id number] Arrival date & time: 08/10/21  4784     History {Add pertinent medical, surgical, social history, OB history to HPI:1} Chief Complaint  Patient presents with   Abdominal Pain   Constipation    Alexis Grant is a 71 y.o. female.  Patient complains of abdominal pain and constipation.  Patient has a history of diabetes and hypertension.   Abdominal Pain Associated symptoms: constipation   Constipation Associated symptoms: abdominal pain        Home Medications Prior to Admission medications   Medication Sig Start Date End Date Taking? Authorizing Provider  psyllium (METAMUCIL) 0.52 g capsule Take 1 capsule (0.52 g total) by mouth daily. 08/10/21  Yes Milton Ferguson, MD  ACCU-CHEK GUIDE test strip USE UP TO FOUR TIMES DAILY AS DIRECTED 10/01/20   Binnie Rail, MD  acetaminophen (TYLENOL) 500 MG tablet Take 1 tablet (500 mg total) by mouth every 6 (six) hours as needed. 09/01/19   Binnie Rail, MD  acetaminophen-codeine (TYLENOL #3) 300-30 MG tablet TAKE 2 TABLETS TWICE DAILY AS NEEDED FOR MODERATE PAIN 07/28/21   Janith Lima, MD  albuterol (VENTOLIN HFA) 108 (90 Base) MCG/ACT inhaler Inhale 2 puffs into the lungs every 6 (six) hours as needed for wheezing or shortness of breath. 02/05/20   Biagio Borg, MD  blood glucose meter kit and supplies KIT Dispense based on patient and insurance preference. Use up to four times daily as directed. (FOR E11.9). 05/03/18   Binnie Rail, MD  Botulinum Toxin Type A (BOTOX) 200 units SOLR Inject 155 units IM into multiple site in the face,neck and head once every 90 days 04/23/21   Pieter Partridge, DO  cholecalciferol (VITAMIN D3) 25 MCG (1000 UNIT) tablet Take 2,000 Units by mouth daily.    [provider]  glipiZIDE (GLUCOTROL) 10 MG tablet Take 1 tablet (10 mg total) by mouth 2 (two) times daily before a meal. 04/02/21   Burns, Claudina Lick, MD   levothyroxine (SYNTHROID) 75 MCG tablet Take 1 tablet (75 mcg total) by mouth daily before breakfast. 02/04/21   Burns, Claudina Lick, MD  loperamide (IMODIUM) 2 MG capsule Take 1 capsule (2 mg total) by mouth 4 (four) times daily as needed for diarrhea or loose stools. 08/11/19   Hayden Rasmussen, MD  losartan (COZAAR) 50 MG tablet Take 1 tablet (50 mg total) by mouth daily. 02/04/21   Binnie Rail, MD  ondansetron (ZOFRAN-ODT) 4 MG disintegrating tablet Take 1 tablet (4 mg total) by mouth every 8 (eight) hours as needed for nausea or vomiting. 08/11/19   Hayden Rasmussen, MD  Semaglutide,0.25 or 0.5MG/DOS, (OZEMPIC, 0.25 OR 0.5 MG/DOSE,) 2 MG/3ML SOPN INJECT 0.25MG UNDER THE SKIN ONE TIME WEEKLY FOR 4 WEEKS, THEN INCREASE TO 0.5MG WEEKLY 07/01/21   Binnie Rail, MD  sertraline (ZOLOFT) 100 MG tablet TAKE 1 AND 1/2 TABLETS EVERY DAY 08/06/21   Binnie Rail, MD  simvastatin (ZOCOR) 20 MG tablet Take 1 tablet (20 mg total) by mouth at bedtime. 03/26/21   Binnie Rail, MD  topiramate (TOPAMAX) 200 MG tablet TAKE 1 TABLET BY MOUTH TWICE DAILY 04/15/21   Cameron Sprang, MD  traZODone (DESYREL) 50 MG tablet TAKE 2 TABLETS BY MOUTH AT BEDTIME FOR SLEEP 05/14/21   Binnie Rail, MD      Allergies    Hydrocodone-acetaminophen, Jardiance [empagliflozin], and Metformin and related  Review of Systems   Review of Systems  Gastrointestinal:  Positive for abdominal pain and constipation.    Physical Exam Updated Vital Signs BP 133/68   Pulse 69   Temp 98.3 F (36.8 C) (Oral)   Resp 18   SpO2 96%  Physical Exam  ED Results / Procedures / Treatments   Labs (all labs ordered are listed, but only abnormal results are displayed) Labs Reviewed  CBC WITH DIFFERENTIAL/PLATELET - Abnormal; Notable for the following components:      Result Value   RDW 15.8 (*)    Neutro Abs 8.1 (*)    Abs Immature Granulocytes 0.08 (*)    All other components within normal limits  COMPREHENSIVE METABOLIC PANEL -  Abnormal; Notable for the following components:   Potassium 3.0 (*)    CO2 20 (*)    Glucose, Bld 192 (*)    Albumin 3.3 (*)    AST 10 (*)    All other components within normal limits  URINALYSIS, ROUTINE W REFLEX MICROSCOPIC - Abnormal; Notable for the following components:   Ketones, ur 80 (*)    All other components within normal limits    EKG None  Radiology CT ABDOMEN PELVIS W CONTRAST  Result Date: 08/10/2021 CLINICAL DATA:  Abdominal pain, acute, nonlocalized. Patient states her last bowel movement was at least 2 weeks ago. EXAM: CT ABDOMEN AND PELVIS WITH CONTRAST TECHNIQUE: Multidetector CT imaging of the abdomen and pelvis was performed using the standard protocol following bolus administration of intravenous contrast. RADIATION DOSE REDUCTION: This exam was performed according to the departmental dose-optimization program which includes automated exposure control, adjustment of the mA and/or kV according to patient size and/or use of iterative reconstruction technique. CONTRAST:  134m OMNIPAQUE IOHEXOL 300 MG/ML  SOLN COMPARISON:  CT examination dated August 11, 2019 FINDINGS: Lower chest: No acute abnormality. Hepatobiliary: Multiple hypodense lesions in the posterior aspect of the right hepatic lobe with discontinuous peripheral enhancement likely hemangioma. Status post cholecystectomy. No biliary ductal dilatation. Pancreas: Mild-to-moderate atrophy. No pancreatic ductal dilatation or surrounding inflammatory changes. Spleen: Normal in size without focal abnormality. Adrenals/Urinary Tract: Adrenal glands are unremarkable. Kidneys are normal, without renal calculi, focal lesion, or hydronephrosis. Subcentimeter exophytic cyst in the lower pole of the left kidney. Bladder is unremarkable. Stomach/Bowel: Small hiatal hernia. Stomach is within normal limits. Appendix not identified. Small and large bowel loops are normal in caliber. There is moderate amount of stool in the rectum with  rectal wall thickening and surrounding inflammatory changes concerning for fecal impaction and proctitis. Vascular/Lymphatic: Mild aortic atherosclerosis. No enlarged abdominal or pelvic lymph nodes. Reproductive: Status post hysterectomy. No adnexal masses. Other: No abdominal wall hernia or abnormality. No abdominopelvic ascites. Musculoskeletal: Mild multilevel degenerative disease of the lumbar spine. No acute or significant osseous findings. IMPRESSION: 1. Moderate amount of stool in the rectum with rectal wall thickening and surrounding inflammatory changes concerning for fecal impaction and proctitis. 2.  No evidence of bowel obstruction. 3.  Multiple hemangiomas of the right hepatic lobe are unchanged. 4.  Status post cholecystectomy and hysterectomy. Electronically Signed   By: IKeane PoliceD.O.   On: 08/10/2021 13:59    Procedures Procedures  {Document cardiac monitor, telemetry assessment procedure when appropriate:1}  Medications Ordered in ED Medications  sodium chloride 0.9 % bolus 500 mL (0 mLs Intravenous Stopped 08/10/21 1150)  HYDROmorphone (DILAUDID) injection 0.5 mg (0.5 mg Intravenous Given 08/10/21 1307)  iohexol (OMNIPAQUE) 300 MG/ML solution 100 mL (100 mLs Intravenous  Contrast Given 08/10/21 1332)  sodium chloride (PF) 0.9 % injection (  Given by Other 08/10/21 1337)    ED Course/ Medical Decision Making/ A&P CT scan shows constipation.  Patient was given tapwater enema.                         Medical Decision Making Amount and/or Complexity of Data Reviewed Labs: ordered. Radiology: ordered.  Risk OTC drugs. Prescription drug management.   Patient with constipation.  She is given a prescription of Metamucil and will follow-up with her PCP  {Document critical care time when appropriate:1} {Document review of labs and clinical decision tools ie heart score, Chads2Vasc2 etc:1}  {Document your independent review of radiology images, and any outside  records:1} {Document your discussion with family members, caretakers, and with consultants:1} {Document social determinants of health affecting pt's care:1} {Document your decision making why or why not admission, treatments were needed:1} Final Clinical Impression(s) / ED Diagnoses Final diagnoses:  Constipation, unspecified constipation type    Rx / DC Orders ED Discharge Orders          Ordered    psyllium (METAMUCIL) 0.52 g capsule  Daily        08/10/21 1642

## 2021-08-10 NOTE — ED Notes (Signed)
Pt family going to get clothes for pt

## 2021-08-10 NOTE — ED Notes (Signed)
Patient transported to CT 

## 2021-08-10 NOTE — Discharge Instructions (Signed)
Follow-up with your family doctor as planned on Monday.  Drink plenty of fluids

## 2021-08-10 NOTE — ED Triage Notes (Signed)
Pt BIB EMS c/o generalized abd pain x 1 week. Pt states her last BM was "at least 2 weeks ago". Pt states she has not urinated in "3 days".

## 2021-08-16 ENCOUNTER — Encounter: Payer: Self-pay | Admitting: Internal Medicine

## 2021-08-16 NOTE — Patient Instructions (Addendum)
     Blood work was ordered.     Medications changes include :   decrease glipizide to once a day with breakfast.  If sugars are consistently less than 120 - try stopping the glipizide together.    Your prescription(s) have been sent to your pharmacy.     Return in about 6 months (around 02/17/2022) for Physical Exam.

## 2021-08-16 NOTE — Progress Notes (Unsigned)
Subjective:    Patient ID: Alexis Grant, female    DOB: 04/16/1950, 71 y.o.   MRN: [id number]     HPI Alexis Grant is here for follow up of her chronic medical problems, including severe OA in hips, htn, DM, hld, hypothyroid, depression, insomnia, OAB  ED one week ago for severe constipation.  Tried everything she could.  Ozempic caused it - never had constipation prior to that.  They gave her an enema and that worked but had diarrhea x 3-4 days after and has been weak ever since.  She is eating a little, but not a lot.  Starting taking metamucil daily. She drinks a lot of water.    Ozempic - lost 20 lbs.  Sugars good. Sugars 80-100.  She does not want to stop it.    Medications and allergies reviewed with patient and updated if appropriate.  Current Outpatient Medications on File Prior to Visit  Medication Sig Dispense Refill   ACCU-CHEK GUIDE test strip USE UP TO FOUR TIMES DAILY AS DIRECTED 100 each 0   acetaminophen (TYLENOL) 500 MG tablet Take 1 tablet (500 mg total) by mouth every 6 (six) hours as needed. 30 tablet 3   acetaminophen-codeine (TYLENOL #3) 300-30 MG tablet TAKE 2 TABLETS TWICE DAILY AS NEEDED FOR MODERATE PAIN 120 tablet 0   albuterol (VENTOLIN HFA) 108 (90 Base) MCG/ACT inhaler Inhale 2 puffs into the lungs every 6 (six) hours as needed for wheezing or shortness of breath. 8 g 5   blood glucose meter kit and supplies KIT Dispense based on patient and insurance preference. Use up to four times daily as directed. (FOR E11.9). 1 each 0   Botulinum Toxin Type A (BOTOX) 200 units SOLR Inject 155 units IM into multiple site in the face,neck and head once every 90 days 1 each 4   cholecalciferol (VITAMIN D3) 25 MCG (1000 UNIT) tablet Take 2,000 Units by mouth daily.     glipiZIDE (GLUCOTROL) 10 MG tablet Take 1 tablet (10 mg total) by mouth 2 (two) times daily before a meal. 180 tablet 2   levothyroxine (SYNTHROID) 75 MCG tablet Take 1 tablet (75 mcg total) by mouth  daily before breakfast. 90 tablet 2   loperamide (IMODIUM) 2 MG capsule Take 1 capsule (2 mg total) by mouth 4 (four) times daily as needed for diarrhea or loose stools. 12 capsule 0   losartan (COZAAR) 50 MG tablet Take 1 tablet (50 mg total) by mouth daily. 90 tablet 2   ondansetron (ZOFRAN-ODT) 4 MG disintegrating tablet Take 1 tablet (4 mg total) by mouth every 8 (eight) hours as needed for nausea or vomiting. 20 tablet 0   psyllium (METAMUCIL) 0.52 g capsule Take 1 capsule (0.52 g total) by mouth daily. 30 capsule 1   Semaglutide,0.25 or 0.5MG/DOS, (OZEMPIC, 0.25 OR 0.5 MG/DOSE,) 2 MG/3ML SOPN INJECT 0.25MG UNDER THE SKIN ONE TIME WEEKLY FOR 4 WEEKS, THEN INCREASE TO 0.5MG WEEKLY 9 mL 0   sertraline (ZOLOFT) 100 MG tablet TAKE 1 AND 1/2 TABLETS EVERY DAY 135 tablet 1   simvastatin (ZOCOR) 20 MG tablet Take 1 tablet (20 mg total) by mouth at bedtime. 90 tablet 2   topiramate (TOPAMAX) 200 MG tablet TAKE 1 TABLET BY MOUTH TWICE DAILY 180 tablet 3   traZODone (DESYREL) 50 MG tablet TAKE 2 TABLETS BY MOUTH AT BEDTIME FOR SLEEP 180 tablet 0   No current facility-administered medications on file prior to visit.  Review of Systems  Constitutional:  Positive for fatigue. Negative for fever.  Respiratory:  Positive for cough (in morning) and shortness of breath (with moderate exertion). Negative for wheezing.   Cardiovascular:  Negative for chest pain, palpitations and leg swelling.  Gastrointestinal:  Positive for constipation. Negative for abdominal pain and nausea.       No gerd  Neurological:  Positive for light-headedness. Negative for headaches.       Objective:   Vitals:   08/17/21 1546  BP: 118/84  Pulse: 84  Temp: 98.1 F (36.7 C)  SpO2: 97%   BP Readings from Last 3 Encounters:  08/17/21 118/84  08/10/21 133/67  04/15/21 138/78   Wt Readings from Last 3 Encounters:  08/17/21 292 lb 4 oz (132.6 kg)  04/15/21 (!) 311 lb 12.8 oz (141.4 kg)  02/04/21 (!) 310 lb 3.2 oz  (140.7 kg)   Body mass index is 51.77 kg/m.    Physical Exam Constitutional:      General: She is not in acute distress.    Appearance: Normal appearance.  HENT:     Head: Normocephalic and atraumatic.  Eyes:     Conjunctiva/sclera: Conjunctivae normal.  Cardiovascular:     Rate and Rhythm: Normal rate and regular rhythm.     Heart sounds: Normal heart sounds. No murmur heard. Pulmonary:     Effort: Pulmonary effort is normal. No respiratory distress.     Breath sounds: Normal breath sounds. No wheezing.  Musculoskeletal:     Cervical back: Neck supple.     Right lower leg: No edema.     Left lower leg: No edema.  Lymphadenopathy:     Cervical: No cervical adenopathy.  Skin:    General: Skin is warm and dry.     Findings: No rash.  Neurological:     Mental Status: She is alert. Mental status is at baseline.  Psychiatric:        Mood and Affect: Mood normal.        Behavior: Behavior normal.        Lab Results  Component Value Date   WBC 10.0 08/10/2021   HGB 13.1 08/10/2021   HCT 40.0 08/10/2021   PLT 211 08/10/2021   GLUCOSE 192 (H) 08/10/2021   CHOL 159 02/04/2021   TRIG 123.0 02/04/2021   HDL 52.90 02/04/2021   LDLCALC 81 02/04/2021   ALT 11 08/10/2021   AST 10 (L) 08/10/2021   NA 140 08/10/2021   K 3.0 (L) 08/10/2021   CL 109 08/10/2021   CREATININE 0.66 08/10/2021   BUN 11 08/10/2021   CO2 20 (L) 08/10/2021   TSH 2.75 02/04/2021   INR 1.17 11/09/2008   HGBA1C 7.1 (H) 02/04/2021   MICROALBUR 4.2 (H) 11/25/2015     Assessment & Plan:    See Problem List for Assessment and Plan of chronic medical problems.

## 2021-08-17 ENCOUNTER — Ambulatory Visit (INDEPENDENT_AMBULATORY_CARE_PROVIDER_SITE_OTHER): Payer: Medicare HMO | Admitting: Internal Medicine

## 2021-08-17 VITALS — BP 118/84 | HR 84 | Temp 98.1°F | Ht 63.0 in | Wt 292.2 lb

## 2021-08-17 DIAGNOSIS — M160 Bilateral primary osteoarthritis of hip: Secondary | ICD-10-CM | POA: Diagnosis not present

## 2021-08-17 DIAGNOSIS — I10 Essential (primary) hypertension: Secondary | ICD-10-CM

## 2021-08-17 DIAGNOSIS — E039 Hypothyroidism, unspecified: Secondary | ICD-10-CM

## 2021-08-17 DIAGNOSIS — K5903 Drug induced constipation: Secondary | ICD-10-CM | POA: Diagnosis not present

## 2021-08-17 DIAGNOSIS — K5900 Constipation, unspecified: Secondary | ICD-10-CM | POA: Insufficient documentation

## 2021-08-17 DIAGNOSIS — E785 Hyperlipidemia, unspecified: Secondary | ICD-10-CM

## 2021-08-17 DIAGNOSIS — E1151 Type 2 diabetes mellitus with diabetic peripheral angiopathy without gangrene: Secondary | ICD-10-CM

## 2021-08-17 DIAGNOSIS — F3289 Other specified depressive episodes: Secondary | ICD-10-CM | POA: Diagnosis not present

## 2021-08-17 DIAGNOSIS — G479 Sleep disorder, unspecified: Secondary | ICD-10-CM | POA: Diagnosis not present

## 2021-08-17 DIAGNOSIS — N3281 Overactive bladder: Secondary | ICD-10-CM

## 2021-08-17 MED ORDER — GLIPIZIDE 10 MG PO TABS: 10.0000 mg | ORAL_TABLET | Freq: Every day | ORAL | 2 refills | Status: DC

## 2021-08-17 NOTE — Assessment & Plan Note (Signed)
Chronic  Lab Results  Component Value Date   HGBA1C 7.1 (H) 02/04/2021   Sugars not ideally controlled Testing sugars 1-2 times a day Check A1c, urine microalbumin today Continue ozempic but when she restarts ( will wait until bowels get normal again) will take 0.25 mg weekly Stressed  diabetic diet

## 2021-08-17 NOTE — Assessment & Plan Note (Signed)
Chronic Controlled, stable Continue sertraline 150 mg daily

## 2021-08-17 NOTE — Assessment & Plan Note (Signed)
Chronic  Clinically euthyroid Currently taking levothyroxine 75 mcg daily Check tsh  Titrate med dose if needed  

## 2021-08-17 NOTE — Assessment & Plan Note (Signed)
Chronic BP well controlled Continue losartan 50 mg daily cmp

## 2021-08-17 NOTE — Assessment & Plan Note (Signed)
Chronic Check lipid panel  Continue simvastatin 20 mg daily Regular exercise and healthy diet encouraged  

## 2021-08-17 NOTE — Assessment & Plan Note (Signed)
Chronic Controlled, stable Continue trazodone 100 mg nightly 

## 2021-08-17 NOTE — Assessment & Plan Note (Addendum)
Chronic Severe b/l severe hip pain from OA Continue tylenol #3  2 tabs bid

## 2021-08-17 NOTE — Assessment & Plan Note (Signed)
New Related to ozempic ozempic on hold for now  Work on bowel regimen - ozempic -hold until bowels are back to normal and start at low dose 0.25 mg weekly Taking Metamucil daily-can continue Continue increased fluids Can try stool softener or MiraLAX daily

## 2021-08-18 LAB — CBC WITH DIFFERENTIAL/PLATELET
Basophils Absolute: 0.1 10*3/uL (ref 0.0–0.1)
Basophils Relative: 0.7 % (ref 0.0–3.0)
Eosinophils Absolute: 0.1 10*3/uL (ref 0.0–0.7)
Eosinophils Relative: 1.6 % (ref 0.0–5.0)
HCT: 40.5 % (ref 36.0–46.0)
Hemoglobin: 13.3 g/dL (ref 12.0–15.0)
Lymphocytes Relative: 31.2 % (ref 12.0–46.0)
Lymphs Abs: 2.5 10*3/uL (ref 0.7–4.0)
MCHC: 32.9 g/dL (ref 30.0–36.0)
MCV: 83.6 fl (ref 78.0–100.0)
Monocytes Absolute: 0.7 10*3/uL (ref 0.1–1.0)
Monocytes Relative: 8.4 % (ref 3.0–12.0)
Neutro Abs: 4.7 10*3/uL (ref 1.4–7.7)
Neutrophils Relative %: 58.1 % (ref 43.0–77.0)
Platelets: 266 10*3/uL (ref 150.0–400.0)
RBC: 4.85 Mil/uL (ref 3.87–5.11)
RDW: 17 % — ABNORMAL HIGH (ref 11.5–15.5)
WBC: 8 10*3/uL (ref 4.0–10.5)

## 2021-08-18 LAB — BASIC METABOLIC PANEL
BUN: 10 mg/dL (ref 6–23)
CO2: 26 mEq/L (ref 19–32)
Calcium: 9.8 mg/dL (ref 8.4–10.5)
Chloride: 102 mEq/L (ref 96–112)
Creatinine, Ser: 0.64 mg/dL (ref 0.40–1.20)
GFR: 89.36 mL/min (ref >60.00)
Glucose, Bld: 130 mg/dL — ABNORMAL HIGH (ref 70–99)
Potassium: 3.8 mEq/L (ref 3.5–5.1)
Sodium: 139 mEq/L (ref 135–145)

## 2021-08-18 LAB — MICROALBUMIN / CREATININE URINE RATIO
Creatinine,U: 91.4 mg/dL
Microalb Creat Ratio: 1.1 mg/g (ref 0.0–30.0)
Microalb, Ur: 1 mg/dL (ref 0.0–1.9)

## 2021-08-18 LAB — HEMOGLOBIN A1C: Hgb A1c MFr Bld: 6.6 % — ABNORMAL HIGH (ref 4.6–6.5)

## 2021-08-18 LAB — TSH: TSH: 3.02 u[IU]/mL (ref 0.35–5.50)

## 2021-08-24 DIAGNOSIS — H40013 Open angle with borderline findings, low risk, bilateral: Secondary | ICD-10-CM | POA: Diagnosis not present

## 2021-08-24 DIAGNOSIS — H04123 Dry eye syndrome of bilateral lacrimal glands: Secondary | ICD-10-CM | POA: Diagnosis not present

## 2021-09-03 ENCOUNTER — Other Ambulatory Visit: Payer: Self-pay | Admitting: Internal Medicine

## 2021-10-02 ENCOUNTER — Ambulatory Visit: Payer: Medicare HMO | Admitting: Neurology

## 2021-10-02 ENCOUNTER — Telehealth: Payer: Self-pay | Admitting: Internal Medicine

## 2021-10-02 DIAGNOSIS — G43109 Migraine with aura, not intractable, without status migrainosus: Secondary | ICD-10-CM

## 2021-10-02 MED ORDER — ONABOTULINUMTOXINA 100 UNITS IJ SOLR
200.0000 [IU] | Freq: Once | INTRAMUSCULAR | Status: AC
  Administered 2021-10-02: 155 [IU] via INTRAMUSCULAR

## 2021-10-02 NOTE — Progress Notes (Signed)
[facility]  ° °Procedure Note Botox ° °Attending: Dr. Doriana Mazurkiewicz ° °Preoperative Diagnosis(es): Chronic migraine ° °Consent obtained from: The patient °Benefits discussed included, but were not limited to decreased muscle tightness, increased joint range of motion, and decreased pain.  Risk discussed included, but were not limited pain and discomfort, bleeding, bruising, excessive weakness, venous thrombosis, muscle atrophy and dysphagia.  Anticipated outcomes of the procedure as well as he risks and benefits of the alternatives to the procedure, and the roles and tasks of the personnel to be involved, were discussed with the patient, and the patient consents to the procedure and agrees to proceed. A copy of the patient medication guide was given to the patient which explains the blackbox warning. ° °Patients identity and treatment sites confirmed Yes.  . ° °Details of Procedure: °Skin was cleaned with alcohol. Prior to injection, the needle plunger was aspirated to make sure the needle was not within a blood vessel.  There was no blood retrieved on aspiration.   ° °Following is a summary of the muscles injected  And the amount of Botulinum toxin used: ° °Dilution °200 units of Botox was reconstituted with 4 ml of preservative free normal saline. °Time of reconstitution: At the time of the office visit (<30 minutes prior to injection)  ° °Injections  °155 total units of Botox was injected with a 30 gauge needle. ° °Injection Sites: °L occipitalis: 15 units- 3 sites  °R occiptalis: 15 units- 3 sites ° °L upper trapezius: 15 units- 3 sites °R upper trapezius: 15 units- 3 sits          °L paraspinal: 10 units- 2 sites °R paraspinal: 10 units- 2 sites ° °Face °L frontalis(2 injection sites):10 units   °R frontalis(2 injection sites):10 units         °L corrugator: 5 units   °R corrugator: 5 units           °Procerus: 5 units   °L temporalis: 20 units °R temporalis: 20 units  ° °Agent:  °200 units of botulinum Type  A (Onobotulinum Toxin type A) was reconstituted with 4 ml of preservative free normal saline.  °Time of reconstitution: At the time of the office visit (<30 minutes prior to injection)  ° ° ° Total injected (Units):  155 ° Total wasted (Units):  45 ° °Patient tolerated procedure well without complications.   °Reinjection is anticipated in 3 months. ° ° °

## 2021-10-05 DIAGNOSIS — H524 Presbyopia: Secondary | ICD-10-CM | POA: Diagnosis not present

## 2021-10-05 DIAGNOSIS — H04123 Dry eye syndrome of bilateral lacrimal glands: Secondary | ICD-10-CM | POA: Diagnosis not present

## 2021-10-08 ENCOUNTER — Other Ambulatory Visit: Payer: Self-pay | Admitting: Internal Medicine

## 2021-10-08 MED ORDER — ACETAMINOPHEN-CODEINE 300-30 MG PO TABS: ORAL_TABLET | ORAL | 0 refills | Status: DC

## 2021-10-08 NOTE — Telephone Encounter (Signed)
Patient would like her tylenol with codeine sent to [facility] on [address].  Patent only has 3 left.  She take 2 in the morning and 2 at night.

## 2021-10-08 NOTE — Addendum Note (Signed)
Addended by: Binnie Rail on: 10/08/2021 06:03 PM   Modules accepted: Orders

## 2021-10-27 ENCOUNTER — Other Ambulatory Visit: Payer: Self-pay | Admitting: Internal Medicine

## 2021-10-29 ENCOUNTER — Telehealth: Payer: Self-pay

## 2021-10-29 MED ORDER — ACETAMINOPHEN-CODEINE 300-30 MG PO TABS: ORAL_TABLET | ORAL | 0 refills | Status: DC

## 2021-10-29 NOTE — Telephone Encounter (Signed)
MEDICATION: acetaminophen-codeine (TYLENOL #3) 300-30 MG tablet  PHARMACY: [place], [place]  Comments:   **Let patient know to contact pharmacy at the end of the day to make sure medication is ready. **  ** Please notify patient to allow 48-72 hours to process**  **Encourage patient to contact the pharmacy for refills or they can request refills through [facility]**

## 2021-11-06 ENCOUNTER — Telehealth: Payer: Medicare HMO

## 2021-11-08 ENCOUNTER — Other Ambulatory Visit: Payer: Self-pay | Admitting: Internal Medicine

## 2021-11-26 ENCOUNTER — Other Ambulatory Visit: Payer: Self-pay | Admitting: Internal Medicine

## 2021-11-30 DIAGNOSIS — H524 Presbyopia: Secondary | ICD-10-CM | POA: Diagnosis not present

## 2021-12-28 ENCOUNTER — Other Ambulatory Visit: Payer: Self-pay | Admitting: Internal Medicine

## 2021-12-29 ENCOUNTER — Telehealth: Payer: Self-pay

## 2021-12-29 NOTE — Telephone Encounter (Signed)
Pharmacy Patient Advocate Encounter   Received notification that prior authorization for Botox 200UNIT solution is required/requested.    PA submitted on 12/29/2021 via CoverMyMeds Key [id number] Status is pending

## 2021-12-29 NOTE — Telephone Encounter (Signed)
Botox One Benefit Verification [id number] Submitted!

## 2021-12-30 NOTE — Telephone Encounter (Signed)
Pharmacy Patient Advocate Encounter  Prior Authorization for Botox 200 Unit Solution has been approved.    PA# [id number] Effective dates: 01/18/2021 through 01/18/2023.

## 2022-01-01 ENCOUNTER — Ambulatory Visit: Payer: Medicare HMO | Admitting: Neurology

## 2022-01-01 DIAGNOSIS — G43E09 Chronic migraine with aura, not intractable, without status migrainosus: Secondary | ICD-10-CM | POA: Diagnosis not present

## 2022-01-01 MED ORDER — ONABOTULINUMTOXINA 100 UNITS IJ SOLR
200.0000 [IU] | Freq: Once | INTRAMUSCULAR | Status: AC
  Administered 2022-01-01: 155 [IU] via INTRAMUSCULAR

## 2022-01-01 NOTE — Progress Notes (Signed)
[facility]  ° °Procedure Note Botox ° °Attending: Dr. Khyla Mccumbers ° °Preoperative Diagnosis(es): Chronic migraine ° °Consent obtained from: The patient °Benefits discussed included, but were not limited to decreased muscle tightness, increased joint range of motion, and decreased pain.  Risk discussed included, but were not limited pain and discomfort, bleeding, bruising, excessive weakness, venous thrombosis, muscle atrophy and dysphagia.  Anticipated outcomes of the procedure as well as he risks and benefits of the alternatives to the procedure, and the roles and tasks of the personnel to be involved, were discussed with the patient, and the patient consents to the procedure and agrees to proceed. A copy of the patient medication guide was given to the patient which explains the blackbox warning. ° °Patients identity and treatment sites confirmed Yes.  . ° °Details of Procedure: °Skin was cleaned with alcohol. Prior to injection, the needle plunger was aspirated to make sure the needle was not within a blood vessel.  There was no blood retrieved on aspiration.   ° °Following is a summary of the muscles injected  And the amount of Botulinum toxin used: ° °Dilution °200 units of Botox was reconstituted with 4 ml of preservative free normal saline. °Time of reconstitution: At the time of the office visit (<30 minutes prior to injection)  ° °Injections  °155 total units of Botox was injected with a 30 gauge needle. ° °Injection Sites: °L occipitalis: 15 units- 3 sites  °R occiptalis: 15 units- 3 sites ° °L upper trapezius: 15 units- 3 sites °R upper trapezius: 15 units- 3 sits          °L paraspinal: 10 units- 2 sites °R paraspinal: 10 units- 2 sites ° °Face °L frontalis(2 injection sites):10 units   °R frontalis(2 injection sites):10 units         °L corrugator: 5 units   °R corrugator: 5 units           °Procerus: 5 units   °L temporalis: 20 units °R temporalis: 20 units  ° °Agent:  °200 units of botulinum Type  A (Onobotulinum Toxin type A) was reconstituted with 4 ml of preservative free normal saline.  °Time of reconstitution: At the time of the office visit (<30 minutes prior to injection)  ° ° ° Total injected (Units):  155 ° Total wasted (Units):  45 ° °Patient tolerated procedure well without complications.   °Reinjection is anticipated in 3 months. ° ° °

## 2022-01-18 ENCOUNTER — Other Ambulatory Visit: Payer: Self-pay | Admitting: Neurology

## 2022-01-28 ENCOUNTER — Ambulatory Visit (INDEPENDENT_AMBULATORY_CARE_PROVIDER_SITE_OTHER): Payer: Medicare HMO

## 2022-01-28 VITALS — Ht 63.0 in | Wt 292.0 lb

## 2022-01-28 DIAGNOSIS — Z0000 Encounter for general adult medical examination without abnormal findings: Secondary | ICD-10-CM | POA: Diagnosis not present

## 2022-01-28 NOTE — Patient Instructions (Signed)
[name] , Thank you for taking time to come for your Medicare Wellness Visit. I appreciate your ongoing commitment to your health goals. Please review the following plan we discussed and let me know if I can assist you in the future.   These are the goals we discussed:  Goals      Client understands the importance of follow-up with providers by attending scheduled visits.        This is a list of the screening recommended for you and due dates:  Health Maintenance  Topic Date Due   Zoster (Shingles) Vaccine (1 of 2) Never done   DTaP/Tdap/Td vaccine (2 - Tdap) 10/05/2018   Complete foot exam   11/17/2018   DEXA scan (bone density measurement)  11/18/2019   Mammogram  11/30/2019   Flu Shot  08/18/2021   COVID-19 Vaccine (3 - 2023-24 season) 09/18/2021   Hemoglobin A1C  02/17/2022   Yearly kidney function blood test for diabetes  08/18/2022   Yearly kidney health urinalysis for diabetes  08/18/2022   Eye exam for diabetics  10/06/2022   Medicare Annual Wellness Visit  01/29/2023   Colon Cancer Screening  07/08/2023   Pneumonia Vaccine  Completed   Hepatitis C Screening: USPSTF Recommendation to screen - Ages 18-79 yo.  Completed   HPV Vaccine  Aged Out    Advanced directives: Yes  Conditions/risks identified: Yes; Type II Diabetes  Next appointment: Follow up in one year for your annual wellness visit.   Preventive Care 15 Years and Older, Female Preventive care refers to lifestyle choices and visits with your health care provider that can promote health and wellness. What does preventive care include? A yearly physical exam. This is also called an annual well check. Dental exams once or twice a year. Routine eye exams. Ask your health care provider how often you should have your eyes checked. Personal lifestyle choices, including: Daily care of your teeth and gums. Regular physical activity. Eating a healthy diet. Avoiding tobacco and drug use. Limiting alcohol  use. Practicing safe sex. Taking low-dose aspirin every day. Taking vitamin and mineral supplements as recommended by your health care provider. What happens during an annual well check? The services and screenings done by your health care provider during your annual well check will depend on your age, overall health, lifestyle risk factors, and family history of disease. Counseling  Your health care provider may ask you questions about your: Alcohol use. Tobacco use. Drug use. Emotional well-being. Home and relationship well-being. Sexual activity. Eating habits. History of falls. Memory and ability to understand (cognition). Work and work Statistician. Reproductive health. Screening  You may have the following tests or measurements: Height, weight, and BMI. Blood pressure. Lipid and cholesterol levels. These may be checked every 5 years, or more frequently if you are over 57 years old. Skin check. Lung cancer screening. You may have this screening every year starting at age 80 if you have a 30-pack-year history of smoking and currently smoke or have quit within the past 15 years. Fecal occult blood test (FOBT) of the stool. You may have this test every year starting at age 34. Flexible sigmoidoscopy or colonoscopy. You may have a sigmoidoscopy every 5 years or a colonoscopy every 10 years starting at age 30. Hepatitis C blood test. Hepatitis B blood test. Sexually transmitted disease (STD) testing. Diabetes screening. This is done by checking your blood sugar (glucose) after you have not eaten for a while (fasting). You may have  this done every 1-3 years. Bone density scan. This is done to screen for osteoporosis. You may have this done starting at age 86. Mammogram. This may be done every 1-2 years. Talk to your health care provider about how often you should have regular mammograms. Talk with your health care provider about your test results, treatment options, and if necessary,  the need for more tests. Vaccines  Your health care provider may recommend certain vaccines, such as: Influenza vaccine. This is recommended every year. Tetanus, diphtheria, and acellular pertussis (Tdap, Td) vaccine. You may need a Td booster every 10 years. Zoster vaccine. You may need this after age 65. Pneumococcal 13-valent conjugate (PCV13) vaccine. One dose is recommended after age 75. Pneumococcal polysaccharide (PPSV23) vaccine. One dose is recommended after age 82. Talk to your health care provider about which screenings and vaccines you need and how often you need them. This information is not intended to replace advice given to you by your health care provider. Make sure you discuss any questions you have with your health care provider. Document Released: 01/31/2015 Document Revised: 09/24/2015 Document Reviewed: 11/05/2014 Elsevier Interactive Patient Education  2017 [place] Prevention in the Home Falls can cause injuries. They can happen to people of all ages. There are many things you can do to make your home safe and to help prevent falls. What can I do on the outside of my home? Regularly fix the edges of walkways and driveways and fix any cracks. Remove anything that might make you trip as you walk through a door, such as a raised step or threshold. Trim any bushes or trees on the path to your home. Use bright outdoor lighting. Clear any walking paths of anything that might make someone trip, such as rocks or tools. Regularly check to see if handrails are loose or broken. Make sure that both sides of any steps have handrails. Any raised decks and porches should have guardrails on the edges. Have any leaves, snow, or ice cleared regularly. Use sand or salt on walking paths during winter. Clean up any spills in your garage right away. This includes oil or grease spills. What can I do in the bathroom? Use night lights. Install grab bars by the toilet and in the  tub and shower. Do not use towel bars as grab bars. Use non-skid mats or decals in the tub or shower. If you need to sit down in the shower, use a plastic, non-slip stool. Keep the floor dry. Clean up any water that spills on the floor as soon as it happens. Remove soap buildup in the tub or shower regularly. Attach bath mats securely with double-sided non-slip rug tape. Do not have throw rugs and other things on the floor that can make you trip. What can I do in the bedroom? Use night lights. Make sure that you have a light by your bed that is easy to reach. Do not use any sheets or blankets that are too big for your bed. They should not hang down onto the floor. Have a firm chair that has side arms. You can use this for support while you get dressed. Do not have throw rugs and other things on the floor that can make you trip. What can I do in the kitchen? Clean up any spills right away. Avoid walking on wet floors. Keep items that you use a lot in easy-to-reach places. If you need to reach something above you, use a strong step stool  that has a grab bar. Keep electrical cords out of the way. Do not use floor polish or wax that makes floors slippery. If you must use wax, use non-skid floor wax. Do not have throw rugs and other things on the floor that can make you trip. What can I do with my stairs? Do not leave any items on the stairs. Make sure that there are handrails on both sides of the stairs and use them. Fix handrails that are broken or loose. Make sure that handrails are as long as the stairways. Check any carpeting to make sure that it is firmly attached to the stairs. Fix any carpet that is loose or worn. Avoid having throw rugs at the top or bottom of the stairs. If you do have throw rugs, attach them to the floor with carpet tape. Make sure that you have a light switch at the top of the stairs and the bottom of the stairs. If you do not have them, ask someone to add them for  you. What else can I do to help prevent falls? Wear shoes that: Do not have high heels. Have rubber bottoms. Are comfortable and fit you well. Are closed at the toe. Do not wear sandals. If you use a stepladder: Make sure that it is fully opened. Do not climb a closed stepladder. Make sure that both sides of the stepladder are locked into place. Ask someone to hold it for you, if possible. Clearly mark and make sure that you can see: Any grab bars or handrails. First and last steps. Where the edge of each step is. Use tools that help you move around (mobility aids) if they are needed. These include: Canes. Walkers. Scooters. Crutches. Turn on the lights when you go into a dark area. Replace any light bulbs as soon as they burn out. Set up your furniture so you have a clear path. Avoid moving your furniture around. If any of your floors are uneven, fix them. If there are any pets around you, be aware of where they are. Review your medicines with your doctor. Some medicines can make you feel dizzy. This can increase your chance of falling. Ask your doctor what other things that you can do to help prevent falls. This information is not intended to replace advice given to you by your health care provider. Make sure you discuss any questions you have with your health care provider. Document Released: 10/31/2008 Document Revised: 06/12/2015 Document Reviewed: 02/08/2014 Elsevier Interactive Patient Education  2017 [employer].

## 2022-01-28 NOTE — Progress Notes (Signed)
Virtual Visit via Telephone Note  I connected with  [name] on 01/28/22 at  2:45 PM EST by telephone and verified that I am speaking with the correct person using two identifiers.  Location: Patient: Home Provider: [place] Persons participating in the virtual visit: [place]   I discussed the limitations, risks, security and privacy concerns of performing an evaluation and management service by telephone and the availability of in person appointments. The patient expressed understanding and agreed to proceed.  Interactive audio and video telecommunications were attempted between this nurse and patient, however failed, due to patient having technical difficulties OR patient did not have access to video capability.  We continued and completed visit with audio only.  Some vital signs may be absent or patient reported.   Sheral Flow, LPN  Subjective:   JULISSA BROWNING is a 72 y.o. female who presents for Medicare Annual (Subsequent) preventive examination.  Review of Systems     Cardiac Risk Factors include: advanced age (>7mn, >>57women);diabetes mellitus;dyslipidemia;hypertension;obesity (BMI >30kg/m2);sedentary lifestyle     Objective:    Today's Vitals   01/28/22 1446  Weight: 292 lb (132.5 kg)  Height: '5\' 3"'$  (1.6 m)  PainSc: 6   PainLoc: Generalized   Body mass index is 51.73 kg/m.     01/28/2022    2:50 PM 07/28/2021    1:44 PM 04/15/2021   10:03 AM 01/27/2021    2:08 PM 10/13/2020   12:59 PM 06/17/2020    2:50 PM 11/27/2019   11:05 AM  Advanced Directives  Does Patient Have a Medical Advance Directive? Yes Yes Yes Yes Yes Yes No  Type of AParamedicof A[place]stonLiving will HOak[place] TerraceLiving will;Out of facility DNR (pink MOST or yellow form) [place]Living will;Out of facility DNR (pink MOST or yellow form) Living will;Healthcare Power of ACoto de CazaLiving will;Out of facility DNR (pink MOST or yellow form)    Does patient want to make changes to medical advance directive?    No - Patient declined     Copy of H[place]in Chart? No - copy requested   No - copy requested     Would patient like information on creating a medical advance directive?       Yes (MAU/Ambulatory/Procedural Areas - Information given)    Current Medications (verified) Outpatient Encounter Medications as of 01/28/2022  Medication Sig   ACCU-CHEK GUIDE test strip USE UP TO FOUR TIMES DAILY AS DIRECTED   acetaminophen (TYLENOL) 500 MG tablet Take 1 tablet (500 mg total) by mouth every 6 (six) hours as needed.   acetaminophen-codeine (TYLENOL #3) 300-30 MG tablet TAKE 2 TABLETS TWICE A DAY AS NEEDED FOR MODERATE PAIN   albuterol (VENTOLIN HFA) 108 (90 Base) MCG/ACT inhaler Inhale 2 puffs into the lungs every 6 (six) hours as needed for wheezing or shortness of breath.   blood glucose meter kit and supplies KIT Dispense based on patient and insurance preference. Use up to four times daily as directed. (FOR E11.9).   Botulinum Toxin Type A (BOTOX) 200 units SOLR Inject 155 units IM into multiple site in the face,neck and head once every 90 days   cholecalciferol (VITAMIN D3) 25 MCG (1000 UNIT) tablet Take 2,000 Units by mouth daily.   levothyroxine (SYNTHROID) 75 MCG tablet TAKE 1 TABLET EVERY DAY  BEFORE  BREAKFAST   losartan (COZAAR) 50 MG tablet TAKE 1 TABLET EVERY DAY  OZEMPIC, 0.25 OR 0.5 MG/DOSE, 2 MG/3ML SOPN INJECT 0.5 MG UNDER THE SKIN WEEKLY AS DIRECTED.   psyllium (METAMUCIL) 0.52 g capsule Take 1 capsule (0.52 g total) by mouth daily.   sertraline (ZOLOFT) 100 MG tablet TAKE 1 AND 1/2 TABLETS EVERY DAY   simvastatin (ZOCOR) 20 MG tablet TAKE 1 TABLET AT BEDTIME   topiramate (TOPAMAX) 200 MG tablet TAKE 1 TABLET TWICE DAILY   traZODone (DESYREL) 50 MG tablet TAKE 2 TABLETS AT BEDTIME FOR SLEEP   [DISCONTINUED] glipiZIDE  (GLUCOTROL) 10 MG tablet Take 1 tablet (10 mg total) by mouth daily before breakfast.   No facility-administered encounter medications on file as of 01/28/2022.    Allergies (verified) Hydrocodone-acetaminophen, Jardiance [empagliflozin], and Metformin and related   History: Past Medical History:  Diagnosis Date   Adenomatous polyp of colon 2008   Allergy    Anemia    Anxiety    Arthritis    Blood transfusion without reported diagnosis    following knee surgery    Cataract    hx- removed both eyes   Depression    Diabetes mellitus    type II    Dyslipidemia    Fibromyalgia    Gallstones    Gastritis    GERD (gastroesophageal reflux disease)    Hemangioma    Hiatal hernia    Fibromyalgia   Hyperlipidemia    Hypertension    Hypothyroidism 2010   Dr.Dev[place]r   IBS (irritable bowel syndrome)    Dr Fuller Plan   Migraines    Myocardial infarct [facility] 2010   subendocardrial, following R TKR   Obesity    OSA (obstructive sleep apnea)    cpap - does not know settings    Pneumonia    Seizure disorder ([place])    Seizures ([place])    last seizure was 2002 per pt 07-29-16   Sleep apnea    Urinary tract infection    Past Surgical History:  Procedure Laterality Date   [place]  2007   DrGamble ( now seeing Dr Tonita Cong cardiology)   [place]   COLONOSCOPY     COLONOSCOPY WITH PROPOFOL N/A 07/16/2014   Procedure: COLONOSCOPY WITH PROPOFOL;  Surgeon: Ladene Artist, MD;  Location: [facility];  Service: Endoscopy;  Laterality: N/A;   COLONOSCOPY WITH PROPOFOL N/A 09/21/2016   Procedure: COLONOSCOPY WITH PROPOFOL;  Surgeon: Ladene Artist, MD;  Location: [facility];  Service: Endoscopy;  Laterality: N/A;   POLYPECTOMY  2012   6 or 7 adenomatous, Dr.Stark   steroid injection to left si joint  12/2008   Dr.Newton    TOTAL KNEE ARTHROPLASTY Right 11/2005   TOTAL KNEE ARTHROPLASTY Left    TUBAL LIGATION  1986   Family  History  Problem Relation Age of Onset   Colon polyps Mother    Ulcers Father    Colon polyps Brother    Esophageal cancer Brother    Diabetes Sister    Kidney failure Sister    Diabetes Paternal Aunt    Esophageal cancer Brother    Kidney cancer Sister    Renal cancer Sister    Colon cancer Neg Hx    Heart attack Neg Hx    Stroke Neg Hx    Cancer Neg Hx    Gallbladder disease Neg Hx    Rectal cancer Neg Hx    Stomach cancer Neg Hx    Social History   Socioeconomic History  Marital status: Widowed    Spouse name: Not on file   Number of children: 3   Years of education: college   Highest education level: Not on file  Occupational History   Occupation: Retired  Tobacco Use   Smoking status: Never   Smokeless tobacco: Never  Vaping Use   Vaping Use: Never used  Substance and Sexual Activity   Alcohol use: No    Alcohol/week: 0.0 standard drinks of alcohol   Drug use: No   Sexual activity: Not Currently  Other Topics Concern   Not on file  Social History Narrative   Illicit drug use- no   Patient does not get regular exercise due to knee.   Patient lives at home alone.    Patient son and wife lives next door.   Patient has 3 children.    Patient has some college.    Patient retired May 2014.    Social Determinants of Health   Financial Resource Strain: Low Risk  (01/28/2022)   Overall Financial Resource Strain (CARDIA)    Difficulty of Paying Living Expenses: Not hard at all  Food Insecurity: No Food Insecurity (01/28/2022)   Hunger Vital Sign    Worried About Running Out of Food in the Last Year: Never true    Ran Out of Food in the Last Year: Never true  Transportation Needs: No Transportation Needs (01/28/2022)   PRAPARE - Hydrologist (Medical): No    Lack of Transportation (Non-Medical): No  Physical Activity: Inactive (01/28/2022)   Exercise Vital Sign    Days of Exercise per Week: 0 days    Minutes of Exercise per  Session: 0 min  Stress: Stress Concern Present (01/28/2022)   [place]    Feeling of Stress : Rather much  Social Connections: Socially Isolated (01/28/2022)   Social Connection and Isolation Panel [NHANES]    Frequency of Communication with Friends and Family: Twice a week    Frequency of Social Gatherings with Friends and Family: Once a week    Attends Religious Services: Never    Marine scientist or Organizations: No    Attends Archivist Meetings: Never    Marital Status: Widowed    Tobacco Counseling Counseling given: Not Answered   Clinical Intake:  Pre-visit preparation completed: Yes  Pain : 0-10 Pain Score: 6  Pain Type: Chronic pain     BMI - recorded: 51.73 Nutritional Status: BMI > 30  Obese Nutritional Risks: None Diabetes: No  How often do you need to have someone help you when you read instructions, pamphlets, or other written materials from your doctor or pharmacy?: 1 - Never What is the last grade level you completed in school?: HSG; some college  Nutrition Risk Assessment:  Has the patient had any N/V/D within the last 2 months?  No  Does the patient have any non-healing wounds?  No  Has the patient had any unintentional weight loss or weight gain?  No   Diabetes:  Is the patient diabetic?  Yes  If diabetic, was a CBG obtained today?  No  Did the patient bring in their glucometer from home?  No  How often do you monitor your CBG's? Up to 4 times daily.   Financial Strains and Diabetes Management:  Are you having any financial strains with the device, your supplies or your medication? No .  Does the patient want to be seen  by Chronic Care Management for management of their diabetes?  No  Would the patient like to be referred to a Nutritionist or for Diabetic Management?  No   Diabetic Exams:  Diabetic Eye Exam: Completed 10/05/2021 Diabetic Foot Exam: Overdue, Pt  has been advised about the importance in completing this exam. Pt is scheduled for diabetic foot exam on 02/15/2022.   Interpreter Needed?: No  Information entered by :: Lisette Abu, LPN.   Activities of Daily Living    01/28/2022    2:53 PM 01/24/2022    9:59 AM  In your present state of health, do you have any difficulty performing the following activities:  Hearing? 0 0  Vision? 0 0  Difficulty concentrating or making decisions? 0 0  Walking or climbing stairs? 1 1  Dressing or bathing? 0 0  Doing errands, shopping? 1 1  Preparing Food and eating ? N N  Using the Toilet?  N  In the past six months, have you accidently leaked urine? Y Y  Do you have problems with loss of bowel control? N N  Managing your Medications? N N  Managing your Finances? N N  Housekeeping or managing your Housekeeping? N N    Patient Care Team: Binnie Rail, MD as PCP - General (Internal Medicine) Cameron Sprang, MD as Consulting Physician (Neurology) Garald Balding, MD as Consulting Physician (Orthopedic Surgery) Chesley Mires, MD as Consulting Physician (Pulmonary Disease) Monna Fam, MD as Consulting Physician (Ophthalmology) Cameron Sprang, MD as Consulting Physician (Neurology) [place], Darnelle Maffucci, [facility] (Inactive) (Pharmacist)  Indicate any recent Medical Services you may have received from other than [facility] providers in the past year (date may be approximate).     Assessment:   This is a routine wellness examination for [place].  Hearing/Vision screen Hearing Screening - Comments:: Some hearing difficulty; no hearing aids. Vision Screening - Comments:: Wears rx glasses - up to date with routine eye exams with Kandy Garrison, MD.   Dietary issues and exercise activities discussed: Current Exercise Habits: The patient does not participate in regular exercise at present, Exercise limited by: orthopedic condition(s);neurologic condition(s)   Goals Addressed             This  Visit's Progress    Client understands the importance of follow-up with providers by attending scheduled visits.        Depression Screen    01/28/2022    2:58 PM 08/17/2021    3:55 PM 01/27/2021    2:06 PM 11/27/2019   11:02 AM 11/22/2018   11:20 AM 11/16/2017   10:40 AM 11/15/2016    9:00 AM  PHQ 2/9 Scores  PHQ - 2 Score '6 6 3 1 1 3 '$ 0  PHQ- 9 Score '18 18   7 9     '$ Fall Risk    01/28/2022    2:51 PM 01/24/2022    9:59 AM 08/17/2021    3:54 PM 07/28/2021    1:43 PM 04/15/2021   10:03 AM  Fall Risk   Falls in the past year? '1 1 1 1 '$ 0  Number falls in past yr: '1 1 1 '$ 0 0  Injury with Fall? 1 1 0 0 0  Risk for fall due to : History of fall(s);Impaired balance/gait;Orthopedic patient  Other (Comment)    Follow up Education provided;Falls prevention discussed        FALL RISK PREVENTION PERTAINING TO THE HOME:  Any stairs in or around the home? No  If  so, are there any without handrails? No  Home free of loose throw rugs in walkways, pet beds, electrical cords, etc? Yes  Adequate lighting in your home to reduce risk of falls? Yes   ASSISTIVE DEVICES UTILIZED TO PREVENT FALLS:  Life alert? No  Use of a cane, walker or w/c? Yes  Grab bars in the bathroom? No  Shower chair or bench in shower? Yes  Elevated toilet seat or a handicapped toilet? Yes   TIMED UP AND GO:  Was the test performed? No . Phone Visit  Cognitive Function:        01/28/2022    2:54 PM  6CIT Screen  What Year? 0 points  What month? 0 points  What time? 0 points  Count back from 20 0 points  Months in reverse 0 points  Repeat phrase 0 points  Total Score 0 points    Immunizations Immunization History  Administered Date(s) Administered   Fluad Quad(high Dose 65+) 12/22/2018, 02/04/2021   Influenza Split 10/26/2011   Influenza, High Dose Seasonal PF 11/15/2016, 09/27/2017   Influenza,inj,Quad PF,6+ Mos 10/25/2012, 11/25/2015   Influenza-Unspecified 10/19/2014   PFIZER(Purple Top)SARS-COV-2  Vaccination 04/27/2019, 05/25/2019   Pneumococcal Conjugate-13 05/24/2016   Pneumococcal Polysaccharide-23 06/27/2017   Td 10/04/2008    TDAP status: Due, Education has been provided regarding the importance of this vaccine. Advised may receive this vaccine at local pharmacy or Health Dept. Aware to provide a copy of the vaccination record if obtained from local pharmacy or Health Dept. Verbalized acceptance and understanding.  Flu Vaccine status: Due, Education has been provided regarding the importance of this vaccine. Advised may receive this vaccine at local pharmacy or Health Dept. Aware to provide a copy of the vaccination record if obtained from local pharmacy or Health Dept. Verbalized acceptance and understanding.  Pneumococcal vaccine status: Up to date  Covid-19 vaccine status: Completed vaccines  Qualifies for Shingles Vaccine? Yes   Zostavax completed No   Shingrix Completed?: No.    Education has been provided regarding the importance of this vaccine. Patient has been advised to call insurance company to determine out of pocket expense if they have not yet received this vaccine. Advised may also receive vaccine at local pharmacy or Health Dept. Verbalized acceptance and understanding.  Screening Tests Health Maintenance  Topic Date Due   Zoster Vaccines- Shingrix (1 of 2) Never done   DTaP/Tdap/Td (2 - Tdap) 10/05/2018   FOOT EXAM  11/17/2018   DEXA SCAN  11/18/2019   MAMMOGRAM  11/30/2019   INFLUENZA VACCINE  08/18/2021   COVID-19 Vaccine (3 - 2023-24 season) 09/18/2021   HEMOGLOBIN A1C  02/17/2022   Diabetic kidney evaluation - eGFR measurement  08/18/2022   Diabetic kidney evaluation - Urine ACR  08/18/2022   OPHTHALMOLOGY EXAM  10/06/2022   Medicare Annual Wellness (AWV)  01/29/2023   COLONOSCOPY (Pts 45-26yr Insurance coverage will need to be confirmed)  07/08/2023   Pneumonia Vaccine 72 Years old  Completed   Hepatitis C Screening  Completed   HPV VACCINES   Aged Out    Health Maintenance  Health Maintenance Due  Topic Date Due   Zoster Vaccines- Shingrix (1 of 2) Never done   DTaP/Tdap/Td (2 - Tdap) 10/05/2018   FOOT EXAM  11/17/2018   DEXA SCAN  11/18/2019   MAMMOGRAM  11/30/2019   INFLUENZA VACCINE  08/18/2021   COVID-19 Vaccine (3 - 2023-24 season) 09/18/2021    Colorectal cancer screening: Type of screening: Colonoscopy. Completed 07/07/2020.  Repeat every 3 years  Mammogram status: patient declined  Bone Density status: patient declined  Lung Cancer Screening: (Low Dose CT Chest recommended if Age 33-80 years, 30 pack-year currently smoking OR have quit w/in 15years.) does not qualify.   Lung Cancer Screening Referral: no  Additional Screening:  Hepatitis C Screening: does qualify; Completed 04/18/2015  Vision Screening: Recommended annual ophthalmology exams for early detection of glaucoma and other disorders of the eye. Is the patient up to date with their annual eye exam?  Yes  Who is the provider or what is the name of the office in which the patient attends annual eye exams? Kandy Garrison, MD. If pt is not established with a provider, would they like to be referred to a provider to establish care? No .   Dental Screening: Recommended annual dental exams for proper oral hygiene  Community Resource Referral / Chronic Care Management: CRR required this visit?  No   CCM required this visit?  No      Plan:     I have personally reviewed and noted the following in the patient's chart:   Medical and social history Use of alcohol, tobacco or illicit drugs  Current medications and supplements including opioid prescriptions. Patient is not currently taking opioid prescriptions. Functional ability and status Nutritional status Physical activity Advanced directives List of other physicians Hospitalizations, surgeries, and ER visits in previous 12 months Vitals Screenings to include cognitive, depression, and  falls Referrals and appointments  In addition, I have reviewed and discussed with patient certain preventive protocols, quality metrics, and best practice recommendations. A written personalized care plan for preventive services as well as general preventive health recommendations were provided to patient.     Sheral Flow, LPN   [id number]/54/[id number]   Nurse Notes: N/A

## 2022-02-02 ENCOUNTER — Other Ambulatory Visit: Payer: Self-pay | Admitting: Internal Medicine

## 2022-02-14 ENCOUNTER — Encounter: Payer: Self-pay | Admitting: Internal Medicine

## 2022-02-14 NOTE — Patient Instructions (Addendum)
      Blood work was ordered.   The lab is on the first floor.    Medications changes include :       A referral was ordered for XXX.     Someone will call you to schedule an appointment.    Return in about 6 months (around 08/16/2022) for Physical Exam.

## 2022-02-14 NOTE — Progress Notes (Unsigned)
Subjective:    Patient ID: Alexis Grant, female    DOB: 12/17/1950, 72 y.o.   MRN: [id number]     HPI Alexis Grant is here for follow up of her chronic medical problems, including severe OA in hips, htn, DM, hld, hypothyroid, depression, insomnia, OAB  Woke up this morning with a sore throat.  She has mild cough and some congestion in her chest.  She states open mild wheezing. She cannot come in today.  Left thumb joint - pain from arthritis.  The thumb joint is very painful.  She wondered what could be done about it.  Sleep quality is good.  Gets about 5 hrs.    Review of Systems  Constitutional:  Negative for chills and fever.  HENT:  Positive for sore throat. Negative for congestion and sinus pain.   Respiratory:  Positive for cough (mild chest congestion) and wheezing (mild). Negative for sputum production.   Gastrointestinal:  Negative for diarrhea and nausea.  Musculoskeletal:  Positive for joint pain. Negative for myalgias.  Neurological:  Negative for dizziness and headaches.       Mild lightheadedness - not new     Medications and allergies reviewed with patient and updated if appropriate.  Current Outpatient Medications on File Prior to Visit  Medication Sig Dispense Refill   ACCU-CHEK GUIDE test strip USE UP TO FOUR TIMES DAILY AS DIRECTED 100 each 0   acetaminophen (TYLENOL) 500 MG tablet Take 1 tablet (500 mg total) by mouth every 6 (six) hours as needed. 30 tablet 3   acetaminophen-codeine (TYLENOL #3) 300-30 MG tablet TAKE 2 TABLETS TWICE A DAY AS NEEDED FOR MODERATE PAIN 120 tablet 0   albuterol (VENTOLIN HFA) 108 (90 Base) MCG/ACT inhaler Inhale 2 puffs into the lungs every 6 (six) hours as needed for wheezing or shortness of breath. 8 g 5   blood glucose meter kit and supplies KIT Dispense based on patient and insurance preference. Use up to four times daily as directed. (FOR E11.9). 1 each 0   Botulinum Toxin Type A (BOTOX) 200 units SOLR Inject 155 units  IM into multiple site in the face,neck and head once every 90 days 1 each 4   cholecalciferol (VITAMIN D3) 25 MCG (1000 UNIT) tablet Take 2,000 Units by mouth daily.     levothyroxine (SYNTHROID) 75 MCG tablet TAKE 1 TABLET EVERY DAY  BEFORE  BREAKFAST 90 tablet 2   losartan (COZAAR) 50 MG tablet TAKE 1 TABLET EVERY DAY 90 tablet 2   OZEMPIC, 0.25 OR 0.5 MG/DOSE, 2 MG/3ML SOPN INJECT 0.5 MG UNDER THE SKIN WEEKLY AS DIRECTED. 9 mL 1   psyllium (METAMUCIL) 0.52 g capsule Take 1 capsule (0.52 g total) by mouth daily. 30 capsule 1   sertraline (ZOLOFT) 100 MG tablet TAKE 1 AND 1/2 TABLETS EVERY DAY 135 tablet 1   simvastatin (ZOCOR) 20 MG tablet TAKE 1 TABLET AT BEDTIME 90 tablet 10   topiramate (TOPAMAX) 200 MG tablet TAKE 1 TABLET TWICE DAILY 180 tablet 0   traZODone (DESYREL) 50 MG tablet TAKE 2 TABLETS AT BEDTIME FOR SLEEP 180 tablet 1   No current facility-administered medications on file prior to visit.     Review of Systems  Constitutional:  Negative for chills and fever.  HENT:  Positive for sore throat. Negative for congestion and sinus pain.   Respiratory:  Positive for cough (mild chest congestion) and wheezing (mild). Negative for sputum production.   Gastrointestinal:  Negative for  diarrhea and nausea.  Musculoskeletal:  Positive for joint pain. Negative for myalgias.  Neurological:  Negative for dizziness and headaches.       Mild lightheadedness - not new       Objective:  There were no vitals filed for this visit. BP Readings from Last 3 Encounters:  08/17/21 118/84  08/10/21 133/67  04/15/21 138/78   Wt Readings from Last 3 Encounters:  01/28/22 292 lb (132.5 kg)  08/17/21 292 lb 4 oz (132.6 kg)  04/15/21 (!) 311 lb 12.8 oz (141.4 kg)   There is no height or weight on file to calculate BMI.    Physical Exam     Lab Results  Component Value Date   WBC 8.0 08/17/2021   HGB 13.3 08/17/2021   HCT 40.5 08/17/2021   PLT 266.0 08/17/2021   GLUCOSE 130 (H)  08/17/2021   CHOL 159 02/04/2021   TRIG 123.0 02/04/2021   HDL 52.90 02/04/2021   LDLCALC 81 02/04/2021   ALT 11 08/10/2021   AST 10 (L) 08/10/2021   NA 139 08/17/2021   K 3.8 08/17/2021   CL 102 08/17/2021   CREATININE 0.64 08/17/2021   BUN 10 08/17/2021   CO2 26 08/17/2021   TSH 3.02 08/17/2021   INR 1.17 11/09/2008   HGBA1C 6.6 (H) 08/17/2021   MICROALBUR 1.0 08/17/2021     Assessment & Plan:    See Problem List for Assessment and Plan of chronic medical problems.

## 2022-02-15 ENCOUNTER — Ambulatory Visit (INDEPENDENT_AMBULATORY_CARE_PROVIDER_SITE_OTHER): Payer: Medicare HMO | Admitting: Internal Medicine

## 2022-02-15 DIAGNOSIS — E1151 Type 2 diabetes mellitus with diabetic peripheral angiopathy without gangrene: Secondary | ICD-10-CM | POA: Diagnosis not present

## 2022-02-15 DIAGNOSIS — G479 Sleep disorder, unspecified: Secondary | ICD-10-CM | POA: Diagnosis not present

## 2022-02-15 DIAGNOSIS — M160 Bilateral primary osteoarthritis of hip: Secondary | ICD-10-CM | POA: Diagnosis not present

## 2022-02-15 DIAGNOSIS — I10 Essential (primary) hypertension: Secondary | ICD-10-CM

## 2022-02-15 DIAGNOSIS — E039 Hypothyroidism, unspecified: Secondary | ICD-10-CM

## 2022-02-15 DIAGNOSIS — E785 Hyperlipidemia, unspecified: Secondary | ICD-10-CM | POA: Diagnosis not present

## 2022-02-15 DIAGNOSIS — M79645 Pain in left finger(s): Secondary | ICD-10-CM | POA: Diagnosis not present

## 2022-02-15 DIAGNOSIS — F3289 Other specified depressive episodes: Secondary | ICD-10-CM | POA: Diagnosis not present

## 2022-02-15 DIAGNOSIS — N3281 Overactive bladder: Secondary | ICD-10-CM

## 2022-02-15 NOTE — Assessment & Plan Note (Signed)
Chronic Severe bilateral hip pain from primary osteoarthritis-she does not want surgery Pain medication does help, but still has significant pain Continue Tylenol 3-2 tablets twice daily

## 2022-02-15 NOTE — Assessment & Plan Note (Signed)
Chronic °Regular exercise and healthy diet encouraged °Check lipid panel  °Continue simvastatin 20 mg daily °

## 2022-02-15 NOTE — Progress Notes (Signed)
Virtual Visit via Video Note  I connected with Alexis Grant on 02/15/22 at  3:00 PM EST by a video enabled telemedicine application and verified that I am speaking with the correct person using two identifiers.   I discussed the limitations of evaluation and management by telemedicine and the availability of in person appointments. The patient expressed understanding and agreed to proceed.  Present for the visit:  Myself, Dr Billey Gosling, [name].  The patient is currently at home and I am in the office.    No referring provider.    History of Present Illness: Alexis Grant is here for follow up of her chronic medical problems, including severe OA in hips, htn, DM, hld, hypothyroid, depression, insomnia, OAB  Woke up this morning with a sore throat.  She has mild cough and some congestion in her chest.  She states open mild wheezing. She cannot come in today.  Left thumb joint - pain from arthritis.  The thumb joint is very painful.  She wondered what could be done about it.  Sleep quality is good.  Gets about 5 hrs.     Review of Systems  Constitutional:  Negative for chills and fever.  HENT:  Positive for sore throat. Negative for congestion and sinus pain.   Respiratory:  Positive for cough (mild chest congestion) and wheezing (mild). Negative for sputum production.   Gastrointestinal:  Negative for diarrhea and nausea.  Musculoskeletal:  Positive for joint pain. Negative for myalgias.  Neurological:  Negative for dizziness and headaches.       Mild lightheadedness - not new      Social History   Socioeconomic History   Marital status: Widowed    Spouse name: Not on file   Number of children: 3   Years of education: college   Highest education level: Not on file  Occupational History   Occupation: Retired  Tobacco Use   Smoking status: Never   Smokeless tobacco: Never  Vaping Use   Vaping Use: Never used  Substance and Sexual Activity   Alcohol use: No     Alcohol/week: 0.0 standard drinks of alcohol   Drug use: No   Sexual activity: Not Currently  Other Topics Concern   Not on file  Social History Narrative   Illicit drug use- no   Patient does not get regular exercise due to knee.   Patient lives at home alone.    Patient son and wife lives next door.   Patient has 3 children.    Patient has some college.    Patient retired May 2014.    Social Determinants of Health   Financial Resource Strain: Low Risk  (01/28/2022)   Overall Financial Resource Strain (CARDIA)    Difficulty of Paying Living Expenses: Not hard at all  Food Insecurity: No Food Insecurity (01/28/2022)   Hunger Vital Sign    Worried About Running Out of Food in the Last Year: Never true    Ran Out of Food in the Last Year: Never true  Transportation Needs: No Transportation Needs (01/28/2022)   PRAPARE - Hydrologist (Medical): No    Lack of Transportation (Non-Medical): No  Physical Activity: Inactive (01/28/2022)   Exercise Vital Sign    Days of Exercise per Week: 0 days    Minutes of Exercise per Session: 0 min  Stress: Stress Concern Present (01/28/2022)   [place]    Feeling of  Stress : Rather much  Social Connections: Socially Isolated (01/28/2022)   Social Connection and Isolation Panel [NHANES]    Frequency of Communication with Friends and Family: Twice a week    Frequency of Social Gatherings with Friends and Family: Once a week    Attends Religious Services: Never    Marine scientist or Organizations: No    Attends Archivist Meetings: Never    Marital Status: Widowed     Observations/Objective: Appears well in NAD Breathing normally  Assessment and Plan:  See Problem List for Assessment and Plan of chronic medical problems.   Follow Up Instructions:    I discussed the assessment and treatment plan with the patient. The patient was  provided an opportunity to ask questions and all were answered. The patient agreed with the plan and demonstrated an understanding of the instructions.   The patient was advised to call back or seek an in-person evaluation if the symptoms worsen or if the condition fails to improve as anticipated.    Binnie Rail, MD

## 2022-02-15 NOTE — Assessment & Plan Note (Signed)
Chronic Controlled, Stable Continue sertraline 150 mg daily 

## 2022-02-15 NOTE — Assessment & Plan Note (Signed)
Chronic Sleep is better with the medication-typically gets about 5 hours, but the sleep quality is good with the medication Continue trazodone 100 mg nightly

## 2022-02-15 NOTE — Assessment & Plan Note (Signed)
Chronic   Lab Results  Component Value Date   HGBA1C 6.6 (H) 08/17/2021   Sugars  controlled Check A1c-she will come in to get blood work in the next week or so Continue Ozempic 0.5 mg weekly Stressed regular exercise, diabetic diet

## 2022-02-15 NOTE — Assessment & Plan Note (Signed)
Chronic CMP Continue losartan 50 mg daily

## 2022-02-15 NOTE — Assessment & Plan Note (Signed)
Chronic  Clinically euthyroid Check tsh and will titrate med dose if needed Currently taking levothyroxine 75 mcg daily  

## 2022-02-19 ENCOUNTER — Telehealth (INDEPENDENT_AMBULATORY_CARE_PROVIDER_SITE_OTHER): Payer: Medicare HMO | Admitting: Neurology

## 2022-02-19 ENCOUNTER — Encounter: Payer: Self-pay | Admitting: Neurology

## 2022-02-19 VITALS — Ht 63.0 in

## 2022-02-19 DIAGNOSIS — R251 Tremor, unspecified: Secondary | ICD-10-CM

## 2022-02-19 DIAGNOSIS — G40309 Generalized idiopathic epilepsy and epileptic syndromes, not intractable, without status epilepticus: Secondary | ICD-10-CM | POA: Diagnosis not present

## 2022-02-19 DIAGNOSIS — G43109 Migraine with aura, not intractable, without status migrainosus: Secondary | ICD-10-CM | POA: Diagnosis not present

## 2022-02-19 MED ORDER — TOPIRAMATE 200 MG PO TABS: ORAL_TABLET | ORAL | 3 refills | Status: DC

## 2022-02-19 NOTE — Progress Notes (Signed)
Virtual Visit via Video Note The purpose of this virtual visit is to provide medical care while limiting exposure to the novel coronavirus.    Consent was obtained for video visit:  Yes.   Answered questions that patient had about telehealth interaction:  Yes.     Pt location: Home Physician Location: office Name of referring provider:  Binnie Rail, MD I connected with Alexis Grant at patients initiation/request on 02/19/2022 at  2:00 PM EST by video enabled telemedicine application and verified that I am speaking with the correct person using two identifiers. Pt MRN:  [id number] Pt DOB:  December 16, 1950 Video Participants:  Alexis Grant   History of Present Illness:  The patient had a virtual video visit on 02/19/2022. She was last seen in the neurology clinic 7 months ago for seizures, migraines,a nd tremors. She has been weaned off Depakote since May 2023 with some improvement in tremors. She continues on Topiramate '200mg'$  BID for seizure and migraine prophylaxis. She has also been having Botox for migraine prophylaxis. She is happy to report that she has been doing great with no headaches at all since last visit. The Botox has been a miracle medication for her. She denies any seizures or seizure-like symptoms since 2002, no staring/unresponsive episodes, gaps in time, olfactory/gustatory hallucinations, myoclonic jerks. The tremors are also much better off Depakote, she still has some but nothing like before. Some days they quiet down after the morning hours. She has arthritis everywhere, she cannot hold anything with her left hand. Her hips and knees bother her. She denies any focal numbness/tingling. She usually gets 5 hours of sleep. She has had 2 bad falls in the past couple of months, she lost her balance and hit her head on the rail one time. No loss of consciousness.    History on Initial Assessment 11/28/2014: This is a pleasant 72 yo RH woman with a history of hypertension,  hyperlipidemia, diabetes, obesity, sleep apnea, CAD s/p MI, with seizures and migraines. She reports seizures started at age 8, she woke up in the hospital with no prior warning symptoms. Since then, she has had 4 generalized tonic-clonic seizures, last was in 2002. She also report 5 or 6 "little episodes" where she feels dizzy, with blurred vision, then if she sits down quickly and tries to relax, she can avoid any progression. She would feel briefly confused. No associated olfactory/gustatory hallucinations, focal numbness/tingling/weakness, myoclonic jerks. The last time she had these episodes was at least 12 years ago. She recalls taking Neurontin initially, which did not help. Topamax in the past which caused GI symptoms. She has been taking Depakote ER '500mg'$  in AM, '1000mg'$  in PM for many years now with no side effects. She reports having brain imaging and EEG in the past which were normal, no records available for review.    She reports migraines started at age 75. She has an aura of numbness in her face, cheeks, and temple, then she becomes very sensitive to lights, sounds, and smells. She sees flashing lights then her vision becomes blurred, followed by throbbing headaches in the frontal or occipital regions with associated nausea. No associated focal numbness/tingling/weakness in the extremities. She tried Imitrex with minimal effect. A course of Prednisone   She has chronic neck pain and has been dealing with diarrhea from IBS. She had an MI in 2010.    Epilepsy Risk Factors:  There is a strong family history of seizures in her paternal grandfather, paternal aunt,  nephew. Otherwise she had a normal birth and early development.  There is no history of febrile convulsions, CNS infections such as meningitis/encephalitis, significant traumatic brain injury, neurosurgical procedures.   Prior migraine preventative: Neurontin, Depakote, Topamax, Emgality   Current Outpatient Medications on File Prior to  Visit  Medication Sig Dispense Refill   ACCU-CHEK GUIDE test strip USE UP TO FOUR TIMES DAILY AS DIRECTED 100 each 0   acetaminophen (TYLENOL) 500 MG tablet Take 1 tablet (500 mg total) by mouth every 6 (six) hours as needed. 30 tablet 3   acetaminophen-codeine (TYLENOL #3) 300-30 MG tablet TAKE 2 TABLETS TWICE A DAY AS NEEDED FOR MODERATE PAIN 120 tablet 0   albuterol (VENTOLIN HFA) 108 (90 Base) MCG/ACT inhaler Inhale 2 puffs into the lungs every 6 (six) hours as needed for wheezing or shortness of breath. 8 g 5   blood glucose meter kit and supplies KIT Dispense based on patient and insurance preference. Use up to four times daily as directed. (FOR E11.9). 1 each 0   Botulinum Toxin Type A (BOTOX) 200 units SOLR Inject 155 units IM into multiple site in the face,neck and head once every 90 days 1 each 4   cholecalciferol (VITAMIN D3) 25 MCG (1000 UNIT) tablet Take 2,000 Units by mouth daily.     levothyroxine (SYNTHROID) 75 MCG tablet TAKE 1 TABLET EVERY DAY  BEFORE  BREAKFAST 90 tablet 2   losartan (COZAAR) 50 MG tablet TAKE 1 TABLET EVERY DAY 90 tablet 2   OZEMPIC, 0.25 OR 0.5 MG/DOSE, 2 MG/3ML SOPN INJECT 0.5 MG UNDER THE SKIN WEEKLY AS DIRECTED. 9 mL 1   psyllium (METAMUCIL) 0.52 g capsule Take 1 capsule (0.52 g total) by mouth daily. 30 capsule 1   sertraline (ZOLOFT) 100 MG tablet TAKE 1 AND 1/2 TABLETS EVERY DAY 135 tablet 1   simvastatin (ZOCOR) 20 MG tablet TAKE 1 TABLET AT BEDTIME 90 tablet 10   topiramate (TOPAMAX) 200 MG tablet TAKE 1 TABLET TWICE DAILY 180 tablet 0   traZODone (DESYREL) 50 MG tablet TAKE 2 TABLETS AT BEDTIME FOR SLEEP 180 tablet 1   No current facility-administered medications on file prior to visit.     Observations/Objective:   Vitals:   02/19/22 1308  Height: '5\' 3"'$  (1.6 m)   GEN:  The patient appears stated age and is in NAD.  Neurological examination: Patient is awake, alert. No aphasia or dysarthria. Intact fluency and comprehension. Cranial nerves:  Extraocular movements intact. No facial asymmetry. Motor: moves all extremities symmetrically, at least anti-gravity x 4.    Assessment and Plan:   This is a pleasant 72 yo RH woman with a history of hypertension, hyperlipidemia, obesity, sleep apnea on CPAP, CAD s/p MI, intractable migraine with aura, convulsive seizures, and tremors. She remains seizure-free since 2002 on Topiramate '200mg'$  BID. Botox has helped significantly with migraine prophylaxis. Tremors have also improved off Depakote. We discussed frequent falls and doing Balance therapy. She is not sure it will help but will consider it and let us know if she would like to proceed. She does not drive. Follow-up in 1 year, call for any changes.    Follow Up Instructions:   -I discussed the assessment and treatment plan with the patient. The patient was provided an opportunity to ask questions and all were answered. The patient agreed with the plan and demonstrated an understanding of the instructions.   The patient was advised to call back or seek an in-person evaluation if the  symptoms worsen or if the condition fails to improve as anticipated.    Cameron Sprang, MD  CC: Dr. Quay Burow

## 2022-02-19 NOTE — Patient Instructions (Signed)
Always a pleasure to see you. Continue Topiramate '200mg'$  twice a day. Continue Botox with Dr. Tomi Likens. Follow-up in 1 year, call for any changes.    Seizure Precautions: 1. If medication has been prescribed for you to prevent seizures, take it exactly as directed.  Do not stop taking the medicine without talking to your doctor first, even if you have not had a seizure in a long time.   2. Avoid activities in which a seizure would cause danger to yourself or to others.  Don't operate dangerous machinery, swim alone, or climb in high or dangerous places, such as on ladders, roofs, or girders.  Do not drive unless your doctor says you may.  3. If you have any warning that you may have a seizure, lay down in a safe place where you can't hurt yourself.    4.  No driving for 6 months from last seizure, as per [facility].   Please refer to the following link on the [facility] website for more information: http://www.epilepsyfoundation.org/answerplace/Social/driving/drivingu.cfm   5.  Maintain good sleep hygiene. Avoid alcohol.  6.  Contact your doctor if you have any problems that may be related to the medicine you are taking.  7.  Call 911 and bring the patient back to the ED if:        A.  The seizure lasts longer than 5 minutes.       B.  The patient doesn't awaken shortly after the seizure  C.  The patient has new problems such as difficulty seeing, speaking or moving  D.  The patient was injured during the seizure  E.  The patient has a temperature over 102 F (39C)  F.  The patient vomited and now is having trouble breathing

## 2022-02-26 ENCOUNTER — Other Ambulatory Visit (INDEPENDENT_AMBULATORY_CARE_PROVIDER_SITE_OTHER): Payer: Medicare HMO

## 2022-02-26 DIAGNOSIS — I10 Essential (primary) hypertension: Secondary | ICD-10-CM | POA: Diagnosis not present

## 2022-02-26 DIAGNOSIS — E1151 Type 2 diabetes mellitus with diabetic peripheral angiopathy without gangrene: Secondary | ICD-10-CM | POA: Diagnosis not present

## 2022-02-26 DIAGNOSIS — E785 Hyperlipidemia, unspecified: Secondary | ICD-10-CM | POA: Diagnosis not present

## 2022-02-26 DIAGNOSIS — E039 Hypothyroidism, unspecified: Secondary | ICD-10-CM | POA: Diagnosis not present

## 2022-02-26 LAB — COMPREHENSIVE METABOLIC PANEL
ALT: 13 U/L (ref 0–35)
AST: 12 U/L (ref 0–37)
Albumin: 4.1 g/dL (ref 3.5–5.2)
Alkaline Phosphatase: 66 U/L (ref 39–117)
BUN: 13 mg/dL (ref 6–23)
CO2: 27 mEq/L (ref 19–32)
Calcium: 9.8 mg/dL (ref 8.4–10.5)
Chloride: 102 mEq/L (ref 96–112)
Creatinine, Ser: 0.76 mg/dL (ref 0.40–1.20)
GFR: 78.94 mL/min (ref >60.00)
Glucose, Bld: 174 mg/dL — ABNORMAL HIGH (ref 70–99)
Potassium: 4.3 mEq/L (ref 3.5–5.1)
Sodium: 139 mEq/L (ref 135–145)
Total Bilirubin: 0.4 mg/dL (ref 0.2–1.2)
Total Protein: 6.9 g/dL (ref 6.0–8.3)

## 2022-02-26 LAB — LIPID PANEL
Cholesterol: 180 mg/dL (ref 0–200)
HDL: 60 mg/dL (ref >39.00)
LDL Cholesterol: 94 mg/dL (ref 0–99)
NonHDL: 120.14
Total CHOL/HDL Ratio: 3
Triglycerides: 132 mg/dL (ref 0.0–149.0)
VLDL: 26.4 mg/dL (ref 0.0–40.0)

## 2022-02-26 LAB — HEMOGLOBIN A1C: Hgb A1c MFr Bld: 7.2 % — ABNORMAL HIGH (ref 4.6–6.5)

## 2022-02-26 LAB — TSH: TSH: 3.06 u[IU]/mL (ref 0.35–5.50)

## 2022-03-01 ENCOUNTER — Ambulatory Visit: Payer: Medicare HMO | Admitting: Sports Medicine

## 2022-03-04 ENCOUNTER — Other Ambulatory Visit: Payer: Self-pay | Admitting: Internal Medicine

## 2022-03-08 ENCOUNTER — Other Ambulatory Visit: Payer: Self-pay | Admitting: *Deleted

## 2022-03-08 ENCOUNTER — Encounter: Payer: Self-pay | Admitting: Sports Medicine

## 2022-03-08 ENCOUNTER — Ambulatory Visit (INDEPENDENT_AMBULATORY_CARE_PROVIDER_SITE_OTHER): Payer: Medicare HMO | Admitting: Sports Medicine

## 2022-03-08 ENCOUNTER — Ambulatory Visit (INDEPENDENT_AMBULATORY_CARE_PROVIDER_SITE_OTHER): Payer: Medicare HMO

## 2022-03-08 DIAGNOSIS — R202 Paresthesia of skin: Secondary | ICD-10-CM

## 2022-03-08 DIAGNOSIS — M19032 Primary osteoarthritis, left wrist: Secondary | ICD-10-CM | POA: Diagnosis not present

## 2022-03-08 DIAGNOSIS — M1812 Unilateral primary osteoarthritis of first carpometacarpal joint, left hand: Secondary | ICD-10-CM

## 2022-03-08 DIAGNOSIS — R200 Anesthesia of skin: Secondary | ICD-10-CM

## 2022-03-08 DIAGNOSIS — M79645 Pain in left finger(s): Secondary | ICD-10-CM | POA: Diagnosis not present

## 2022-03-08 MED ORDER — TRUEPLUS LANCETS 33G MISC: 11 refills | Status: DC

## 2022-03-08 MED ORDER — RELION TRUE METRIX TEST STRIPS VI STRP: ORAL_STRIP | 11 refills | Status: DC

## 2022-03-08 MED ORDER — TRUE METRIX LEVEL 1 LOW VI SOLN: 1 refills | Status: AC

## 2022-03-08 MED ORDER — BD SWAB SINGLE USE REGULAR PADS: MEDICATED_PAD | 3 refills | Status: DC

## 2022-03-08 MED ORDER — CELECOXIB 100 MG PO CAPS: 100.0000 mg | ORAL_CAPSULE | Freq: Two times a day (BID) | ORAL | 1 refills | Status: DC

## 2022-03-08 MED ORDER — TRUE METRIX AIR GLUCOSE METER DEVI: 1 refills | Status: AC

## 2022-03-08 NOTE — Addendum Note (Signed)
Addended by: Zannie Cove on: 03/08/2022 01:27 PM   Modules accepted: Orders

## 2022-03-08 NOTE — Progress Notes (Signed)
Alexis Grant - 72 y.o. female MRN [id number]  Date of birth: 10-03-1950  Office Visit Note: Visit Date: 03/08/2022 PCP: Binnie Rail, MD Referred by: Binnie Rail, MD  Subjective: Chief Complaint  Patient presents with   Left Hand - Pain    thumb   HPI: Alexis Grant is a pleasant 72 y.o. female who presents today for left thumb pain, also some numbness/tingling of left hand.  Shain has had about 4 months pain at the base of the left thumb and distal wrist on the radial side.  Her pain has been worsening over the last few weeks, now having difficulty gripping as well as weakness gripping objects.  She is right-hand dominant, however holds all of her objects such as her phone, books, etc. in her left hand.  The thumb pain has been causing difficulty with these activities.  Has been treating this with Biofreeze topical, only mild relief.  She also notes some numbness and tingling in the thumb and index finger, sometimes this goes into all 5 fingers.  This does happen on both hands occasionally, she does attribute this to diabetes as she has neuropathy in the past.  She has a history of bilateral carpal tunnel release surgery back in the 1980s.   Pertinent ROS were reviewed with the patient and found to be negative unless otherwise specified above in HPI.   Assessment & Plan: Visit Diagnoses:  1. Arthritis of carpometacarpal (CMC) joint of left thumb   2. Pain of left thumb   3. 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 Numbness and tingling of left hand    Plan: Discussed with Ester that she has had an exacerbation of her chronic underlying CMC joint arthritis as well as STT arthritis. X-ray demonstrates moderate to severe CMC joint arthritis as well as severe scaphoid-trapezium OA.  We discussed all treatment options such as oral NSAID, bracing, injection therapy, surgical options.  Through shared decision making, elected to proceed with a trial of  oral anti-inflammatories.  Will begin Celebrex 100 mg twice daily for the next 3 weeks.  Also fit her for a CMC cool comfort brace, she will wear this as desires.  I would like her to follow-up in 1 month for reevaluation.  In consideration of injection therapy, we could consider both CMC and at a separate time ST injection in the future.  She does have some generalized neuropathy, but this is in both hands and her exam is less suggestive of true carpal tunnel at this time.  Will treat the thumb and the arthritis, if numbness and tingling continues, may consider EMG/nerve conduction studies at a later date.  Follow-up: Return in about 4 weeks (around 04/05/2022) for f/u thumb pain.   Meds & Orders:  Meds ordered this encounter  Medications   celecoxib (CELEBREX) 100 MG capsule    Sig: Take 1 capsule (100 mg total) by mouth 2 (two) times daily.    Dispense:  60 capsule    Refill:  1    Orders Placed This Encounter  Procedures   XR Finger Thumb Left     Procedures: No procedures performed      Clinical History: No specialty comments available.  She reports that she has never smoked. She has never used smokeless tobacco.  Recent Labs    08/17/21 1639 02/26/22 1031  HGBA1C 6.6* 7.2*    Objective:    Physical Exam  Gen: Well-appearing, in no acute distress;  non-toxic CV:  Well-perfused. Warm.  Resp: Breathing unlabored on room air; no wheezing. Psych: Fluid speech in conversation; appropriate affect; normal thought process Neuro: Sensation intact throughout. No gross coordination deficits.   Ortho Exam - Left hand: Evaluation of the left thumb and hand shows no significant bony bossing.  There is positive TTP over the base of the thumb near the [facility] joint as well as the STT region. + Significant pain with CMC grind test.  There is no subluxation or instability of the UCL/RCL at 90 degrees of flexion and extension.  Negative Tinel's test at the carpal tunnel.  Mild reproduction of  symptoms in all 5 fingers with Phalen's test, negative reverse Phalen's test.  Weakness with fatigable grip left, greater than right.  Imaging: XR Finger Thumb Left  Result Date: 03/08/2022 4 views of the left thumb including AP, lateral, oblique and Mancel Bale view were ordered and reviewed by myself.  X-rays demonstrate moderate to severe CMC joint arthritis with sclerosis and some spurring over the ulnar aspect of the [facility] joint.  There is also severe scaphoid-trapezium OA with significant joint space loss and bony sclerosis.   Past Medical/Family/Surgical/Social History: Medications & Allergies reviewed per EMR, new medications updated. 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 with seizures of localized onset, not intractable, without status epilepticus ([place]) 12/04/2014   Hx of adenomatous colonic polyps 07/16/2014   Benign neoplasm of ascending colon 07/16/2014   Benign neoplasm of descending colon 07/16/2014   Benign neoplasm of cecum 07/16/2014   Benign neoplasm of transverse colon 07/16/2014   Fibromyalgia 05/30/2013   Generalized convulsive epilepsy ([place]) 10/20/2012   Seasonal allergies 12/08/2011   DM (diabetes mellitus), type 2 with peripheral vascular complications ([place]) [id number]   Essential hypertension 01/05/2010   Irritable bowel syndrome 01/05/2010   VERTIGO 09/16/2009   Diarrhea  09/16/2009   Vitamin D deficiency 02/24/2009   MYOCARDIAL INFARCTION, ACUTE, SUBENDOCARDIAL 10/04/2008   Hypothyroidism 03/19/2008   COLONIC POLYPS, ADENOMATOUS, HX OF 08/07/2007   HEMANGIOMA 04/26/2007   Dyslipidemia 04/26/2007   OBESITY 04/26/2007   Depression 04/26/2007   SLEEP APNEA, OBSTRUCTIVE 04/26/2007   HIATAL HERNIA 04/26/2007   FATTY LIVER DISEASE 04/26/2007   Past Medical History:  Diagnosis Date   Adenomatous polyp of colon 2008   Allergy    Anemia    Anxiety    Arthritis    Blood transfusion without reported diagnosis    following knee surgery    Cataract    hx- removed both eyes   Depression    Diabetes mellitus    type II    Dyslipidemia    Fibromyalgia    Gallstones    Gastritis    GERD (gastroesophageal reflux disease)    Hemangioma    Hiatal hernia    Fibromyalgia   Hyperlipidemia    Hypertension    Hypothyroidism 2010   Dr.[place]r   IBS (irritable bowel syndrome)    Dr  Fuller Plan   Migraines    Myocardial infarct ([place]) 2010   subendocardrial, following R TKR   Obesity    OSA (obstructive sleep apnea)    cpap - does not know settings    Pneumonia    Seizure disorder ([place])    Seizures ([place])    last seizure was 2002 per pt 07-29-16   Sleep apnea    Urinary tract infection    Family History  Problem Relation Age of Onset   Colon polyps Mother    Ulcers Father    Colon polyps Brother    Esophageal cancer Brother    Diabetes Sister    Kidney failure Sister    Diabetes Paternal Aunt    Esophageal cancer Brother    Kidney cancer Sister    Renal cancer Sister    Colon cancer Neg Hx    Heart attack Neg Hx    Stroke Neg Hx    Cancer Neg Hx    Gallbladder disease Neg Hx    Rectal cancer Neg Hx    Stomach cancer Neg Hx    Past Surgical History:  Procedure Laterality Date   ABDOMINAL HYSTERECTOMY  1988   CARDIAC CATHETERIZATION  2007   DrGamble ( now seeing Dr Tonita Cong cardiology)   [place]   COLONOSCOPY      COLONOSCOPY WITH PROPOFOL N/A 07/16/2014   Procedure: COLONOSCOPY WITH PROPOFOL;  Surgeon: Ladene Artist, MD;  Location: [facility] ENDOSCOPY;  Service: Endoscopy;  Laterality: N/A;   COLONOSCOPY WITH PROPOFOL N/A 09/21/2016   Procedure: COLONOSCOPY WITH PROPOFOL;  Surgeon: Ladene Artist, MD;  Location: [facility] ENDOSCOPY;  Service: Endoscopy;  Laterality: N/A;   POLYPECTOMY  2012   6 or 7 adenomatous, Dr.Stark   steroid injection to left si joint  12/2008   Dr.Newton    TOTAL KNEE ARTHROPLASTY Right 11/2005   TOTAL KNEE ARTHROPLASTY Left    TUBAL LIGATION  1986   Social History   Occupational History   Occupation: Retired  Tobacco Use   Smoking status: Never   Smokeless tobacco: Never  Vaping Use   Vaping Use: Never used  Substance and Sexual Activity   Alcohol use: No    Alcohol/week: 0.0 standard drinks of alcohol   Drug use: No   Sexual activity: Not Currently

## 2022-03-09 ENCOUNTER — Other Ambulatory Visit: Payer: Self-pay

## 2022-03-09 ENCOUNTER — Encounter: Payer: Self-pay | Admitting: Internal Medicine

## 2022-03-09 MED ORDER — SEMAGLUTIDE (1 MG/DOSE) 4 MG/3ML ~~LOC~~ SOPN: 1.0000 mg | PEN_INJECTOR | SUBCUTANEOUS | 0 refills | Status: DC

## 2022-03-16 ENCOUNTER — Other Ambulatory Visit: Payer: Self-pay

## 2022-03-17 ENCOUNTER — Other Ambulatory Visit: Payer: Self-pay | Admitting: Internal Medicine

## 2022-03-31 ENCOUNTER — Other Ambulatory Visit: Payer: Self-pay | Admitting: Internal Medicine

## 2022-04-02 ENCOUNTER — Ambulatory Visit: Payer: Medicare HMO | Admitting: Neurology

## 2022-04-02 ENCOUNTER — Telehealth: Payer: Self-pay

## 2022-04-02 NOTE — Telephone Encounter (Signed)
Called patient and informed her that we will hold onto her Botox for her. Informed patient that someone from the office will reach out to get her schedule once Vita Barley can take a look at the schedule to see where she can be worked in. Patient verbalized understanding.

## 2022-04-02 NOTE — Telephone Encounter (Signed)
Patient states she tested positive for the flu, and is unable to make todays appt. Wants to know what is going to happen to her BOTOX medication since next available is not until May.

## 2022-04-08 ENCOUNTER — Other Ambulatory Visit: Payer: Self-pay | Admitting: Internal Medicine

## 2022-04-13 ENCOUNTER — Ambulatory Visit: Payer: Medicare HMO | Admitting: Neurology

## 2022-04-13 DIAGNOSIS — G43E09 Chronic migraine with aura, not intractable, without status migrainosus: Secondary | ICD-10-CM | POA: Diagnosis not present

## 2022-04-13 MED ORDER — ONABOTULINUMTOXINA 100 UNITS IJ SOLR
200.0000 [IU] | Freq: Once | INTRAMUSCULAR | Status: AC
  Administered 2022-04-13: 155 [IU] via INTRAMUSCULAR

## 2022-04-13 NOTE — Progress Notes (Signed)
[facility]  ° °Procedure Note Botox ° °Attending: Dr. Kimberley Dastrup ° °Preoperative Diagnosis(es): Chronic migraine ° °Consent obtained from: The patient °Benefits discussed included, but were not limited to decreased muscle tightness, increased joint range of motion, and decreased pain.  Risk discussed included, but were not limited pain and discomfort, bleeding, bruising, excessive weakness, venous thrombosis, muscle atrophy and dysphagia.  Anticipated outcomes of the procedure as well as he risks and benefits of the alternatives to the procedure, and the roles and tasks of the personnel to be involved, were discussed with the patient, and the patient consents to the procedure and agrees to proceed. A copy of the patient medication guide was given to the patient which explains the blackbox warning. ° °Patients identity and treatment sites confirmed Yes.  . ° °Details of Procedure: °Skin was cleaned with alcohol. Prior to injection, the needle plunger was aspirated to make sure the needle was not within a blood vessel.  There was no blood retrieved on aspiration.   ° °Following is a summary of the muscles injected  And the amount of Botulinum toxin used: ° °Dilution °200 units of Botox was reconstituted with 4 ml of preservative free normal saline. °Time of reconstitution: At the time of the office visit (<30 minutes prior to injection)  ° °Injections  °155 total units of Botox was injected with a 30 gauge needle. ° °Injection Sites: °L occipitalis: 15 units- 3 sites  °R occiptalis: 15 units- 3 sites ° °L upper trapezius: 15 units- 3 sites °R upper trapezius: 15 units- 3 sits          °L paraspinal: 10 units- 2 sites °R paraspinal: 10 units- 2 sites ° °Face °L frontalis(2 injection sites):10 units   °R frontalis(2 injection sites):10 units         °L corrugator: 5 units   °R corrugator: 5 units           °Procerus: 5 units   °L temporalis: 20 units °R temporalis: 20 units  ° °Agent:  °200 units of botulinum Type  A (Onobotulinum Toxin type A) was reconstituted with 4 ml of preservative free normal saline.  °Time of reconstitution: At the time of the office visit (<30 minutes prior to injection)  ° ° ° Total injected (Units):  155 ° Total wasted (Units):  45 ° °Patient tolerated procedure well without complications.   °Reinjection is anticipated in 3 months. ° ° °

## 2022-04-28 ENCOUNTER — Other Ambulatory Visit: Payer: Self-pay | Admitting: Internal Medicine

## 2022-05-15 ENCOUNTER — Other Ambulatory Visit: Payer: Self-pay | Admitting: Internal Medicine

## 2022-05-26 ENCOUNTER — Other Ambulatory Visit: Payer: Self-pay | Admitting: Internal Medicine

## 2022-05-28 ENCOUNTER — Ambulatory Visit: Payer: Medicare HMO | Admitting: Neurology

## 2022-06-10 ENCOUNTER — Other Ambulatory Visit: Payer: Self-pay | Admitting: Internal Medicine

## 2022-06-17 ENCOUNTER — Telehealth: Payer: Self-pay

## 2022-06-17 NOTE — Telephone Encounter (Signed)
PA needed for Botox

## 2022-06-18 ENCOUNTER — Other Ambulatory Visit: Payer: Self-pay

## 2022-06-18 MED ORDER — BOTOX 200 UNITS IJ SOLR: INTRAMUSCULAR | 4 refills | Status: DC

## 2022-06-21 ENCOUNTER — Other Ambulatory Visit: Payer: Self-pay | Admitting: Internal Medicine

## 2022-07-02 ENCOUNTER — Ambulatory Visit: Payer: Medicare HMO | Admitting: Neurology

## 2022-07-12 ENCOUNTER — Other Ambulatory Visit (HOSPITAL_COMMUNITY): Payer: Self-pay

## 2022-07-15 ENCOUNTER — Ambulatory Visit: Payer: Medicare HMO | Admitting: Neurology

## 2022-07-15 ENCOUNTER — Other Ambulatory Visit: Payer: Self-pay | Admitting: Internal Medicine

## 2022-07-15 DIAGNOSIS — G43E09 Chronic migraine with aura, not intractable, without status migrainosus: Secondary | ICD-10-CM

## 2022-07-15 MED ORDER — ONABOTULINUMTOXINA 100 UNITS IJ SOLR
200.0000 [IU] | Freq: Once | INTRAMUSCULAR | Status: AC
Start: 2022-07-15 — End: 2022-07-15
  Administered 2022-07-15: 155 [IU] via INTRAMUSCULAR

## 2022-07-15 NOTE — Progress Notes (Signed)
[facility]  ° °Procedure Note Botox ° °Attending: Dr. Keilana Morlock ° °Preoperative Diagnosis(es): Chronic migraine ° °Consent obtained from: The patient °Benefits discussed included, but were not limited to decreased muscle tightness, increased joint range of motion, and decreased pain.  Risk discussed included, but were not limited pain and discomfort, bleeding, bruising, excessive weakness, venous thrombosis, muscle atrophy and dysphagia.  Anticipated outcomes of the procedure as well as he risks and benefits of the alternatives to the procedure, and the roles and tasks of the personnel to be involved, were discussed with the patient, and the patient consents to the procedure and agrees to proceed. A copy of the patient medication guide was given to the patient which explains the blackbox warning. ° °Patients identity and treatment sites confirmed Yes.  . ° °Details of Procedure: °Skin was cleaned with alcohol. Prior to injection, the needle plunger was aspirated to make sure the needle was not within a blood vessel.  There was no blood retrieved on aspiration.   ° °Following is a summary of the muscles injected  And the amount of Botulinum toxin used: ° °Dilution °200 units of Botox was reconstituted with 4 ml of preservative free normal saline. °Time of reconstitution: At the time of the office visit (<30 minutes prior to injection)  ° °Injections  °155 total units of Botox was injected with a 30 gauge needle. ° °Injection Sites: °L occipitalis: 15 units- 3 sites  °R occiptalis: 15 units- 3 sites ° °L upper trapezius: 15 units- 3 sites °R upper trapezius: 15 units- 3 sits          °L paraspinal: 10 units- 2 sites °R paraspinal: 10 units- 2 sites ° °Face °L frontalis(2 injection sites):10 units   °R frontalis(2 injection sites):10 units         °L corrugator: 5 units   °R corrugator: 5 units           °Procerus: 5 units   °L temporalis: 20 units °R temporalis: 20 units  ° °Agent:  °200 units of botulinum Type  A (Onobotulinum Toxin type A) was reconstituted with 4 ml of preservative free normal saline.  °Time of reconstitution: At the time of the office visit (<30 minutes prior to injection)  ° ° ° Total injected (Units):  155 ° Total wasted (Units):  45 ° °Patient tolerated procedure well without complications.   °Reinjection is anticipated in 3 months. ° ° °

## 2022-07-18 ENCOUNTER — Encounter: Payer: Self-pay | Admitting: Internal Medicine

## 2022-07-18 NOTE — Progress Notes (Signed)
Subjective:    Patient ID: Alexis Grant, female    DOB: 1950-11-14, 72 y.o.   MRN: [id number]      HPI Alexis Grant is here for a Physical exam and her chronic medical problems.   Has lost a lot of weight.  Feels better.    Medications and allergies reviewed with patient and updated if appropriate.  Current Outpatient Medications on File Prior to Visit  Medication Sig Dispense Refill   acetaminophen (TYLENOL) 500 MG tablet Take 1 tablet (500 mg total) by mouth every 6 (six) hours as needed. 30 tablet 3   acetaminophen-codeine (TYLENOL #3) 300-30 MG tablet TAKE 2 TABLETS TWICE DAILY AS NEEDED FOR MODERATE PAIN 120 tablet 0   albuterol (VENTOLIN HFA) 108 (90 Base) MCG/ACT inhaler Inhale 2 puffs into the lungs every 6 (six) hours as needed for wheezing or shortness of breath. 8 g 5   Alcohol Swabs (DROPSAFE ALCOHOL PREP) 70 % PADS USE AS DIRECTED TO CHECK SUGAR 100 each 3   Blood Glucose Calibration (TRUE METRIX LEVEL 1) Low SOLN Use when checking blood sugar QID and PRN as QC DX.E11.9 1 each 1   blood glucose meter kit and supplies KIT Dispense based on patient and insurance preference. Use up to four times daily as directed. (FOR E11.9). 1 each 0   Blood Glucose Monitoring Suppl (TRUE METRIX AIR GLUCOSE METER) DEVI Use to check blood glucose QID and PRN DX E11.9 1 each 1   botulinum toxin Type A (BOTOX) 200 units injection Inject 155 units IM into multiple site in the face,neck and head once every 90 days 1 each 4   cholecalciferol (VITAMIN D3) 25 MCG (1000 UNIT) tablet Take 2,000 Units by mouth daily.     glucose blood (RELION TRUE METRIX TEST STRIPS) test strip Use to check blood glucose QID and PRN DX E11.9 100 each 11   levothyroxine (SYNTHROID) 75 MCG tablet TAKE 1 TABLET EVERY DAY BEFORE BREAKFAST 90 tablet 3   losartan (COZAAR) 50 MG tablet TAKE 1 TABLET EVERY DAY 90 tablet 3   psyllium (METAMUCIL) 0.52 g capsule Take 1 capsule (0.52 g total) by mouth daily. 30 capsule 1    Semaglutide, 1 MG/DOSE, 4 MG/3ML SOPN Inject 1 mg as directed once a week. 9 mL 0   sertraline (ZOLOFT) 100 MG tablet TAKE 1 AND 1/2 TABLETS EVERY DAY 135 tablet 3   simvastatin (ZOCOR) 20 MG tablet TAKE 1 TABLET AT BEDTIME 90 tablet 10   topiramate (TOPAMAX) 200 MG tablet TAKE 1 TABLET TWICE DAILY 180 tablet 3   traZODone (DESYREL) 50 MG tablet TAKE 2 TABLETS AT BEDTIME FOR SLEEP 180 tablet 0   TRUEplus Lancets 33G MISC Use to check blood glucose QID and PRN DX E11.9 100 each 11   No current facility-administered medications on file prior to visit.    Review of Systems  Constitutional:  Negative for fever.  Eyes:  Negative for visual disturbance.  Respiratory:  Positive for shortness of breath (with moderate exertion). Negative for cough and wheezing.   Cardiovascular:  Negative for chest pain, palpitations and leg swelling.  Gastrointestinal:  Positive for constipation (from ozempic) and nausea (from ozempic). Negative for abdominal pain, blood in stool and diarrhea.       No gerd  Genitourinary:  Negative for dysuria.  Musculoskeletal:  Positive for arthralgias (diffuse). Negative for back pain.  Skin:  Negative for rash.  Neurological:  Positive for light-headedness (when she first stands). Negative for  headaches.  Psychiatric/Behavioral:  Positive for dysphoric mood. The patient is nervous/anxious.        Objective:   Vitals:   07/19/22 1437  BP: 122/78  Pulse: 91  Temp: 98.4 F (36.9 C)  SpO2: 97%   Filed Weights   07/19/22 1437  Weight: 253 lb (114.8 kg)   Body mass index is 44.82 kg/m.  BP Readings from Last 3 Encounters:  07/19/22 122/78  08/17/21 118/84  08/10/21 133/67    Wt Readings from Last 3 Encounters:  07/19/22 253 lb (114.8 kg)  01/28/22 292 lb (132.5 kg)  08/17/21 292 lb 4 oz (132.6 kg)       Physical Exam Constitutional: She appears well-developed and well-nourished. No distress.  HENT:  Head: Normocephalic and atraumatic.  Right Ear:  External ear normal. Normal ear canal and TM Left Ear: External ear normal.  Normal ear canal and TM Mouth/Throat: Oropharynx is clear and moist.  Eyes: Conjunctivae normal.  Neck: Neck supple. No tracheal deviation present. No thyromegaly present.  No carotid bruit  Cardiovascular: Normal rate, regular rhythm and normal heart sounds.   No murmur heard.  No edema. Pulmonary/Chest: Effort normal and breath sounds normal. No respiratory distress. She has no wheezes. She has no rales.  Breast: deferred   Abdominal: Soft. She exhibits no distension. There is no tenderness.  Lymphadenopathy: She has no cervical adenopathy.  Skin: Skin is warm and dry. She is not diaphoretic.  Psychiatric: She has a normal mood and affect. Her behavior is normal.     Lab Results  Component Value Date   WBC 8.0 08/17/2021   HGB 13.3 08/17/2021   HCT 40.5 08/17/2021   PLT 266.0 08/17/2021   GLUCOSE 174 (H) 02/26/2022   CHOL 180 02/26/2022   TRIG 132.0 02/26/2022   HDL 60.00 02/26/2022   LDLCALC 94 02/26/2022   ALT 13 02/26/2022   AST 12 02/26/2022   NA 139 02/26/2022   K 4.3 02/26/2022   CL 102 02/26/2022   CREATININE 0.76 02/26/2022   BUN 13 02/26/2022   CO2 27 02/26/2022   TSH 3.06 02/26/2022   INR 1.17 11/09/2008   HGBA1C 7.2 (H) 02/26/2022   MICROALBUR 1.0 08/17/2021    Diabetic Foot Exam - Simple   Simple Foot Form Diabetic Foot exam was performed with the following findings: Yes 07/19/2022  3:09 PM  Visual Inspection No deformities, no ulcerations, no other skin breakdown bilaterally: Yes Sensation Testing See comments: Yes Pulse Check Posterior Tibialis and Dorsalis pulse intact bilaterally: Yes Comments Decreased sensation to light touch b/l         Assessment & Plan:   Physical exam: Screening blood work  ordered Exercise  none Weight  obese - has lost a lot of weight with ozempic Substance abuse  none   Reviewed recommended immunizations.   Health Maintenance   Topic Date Due   Zoster Vaccines- Shingrix (1 of 2) Never done   DTaP/Tdap/Td (2 - Tdap) 10/05/2018   Diabetic kidney evaluation - Urine ACR  08/18/2022   COVID-19 Vaccine (3 - 2023-24 season) 08/04/2022 (Originally 09/18/2021)   MAMMOGRAM  07/19/2023 (Originally 11/30/2019)   DEXA SCAN  07/19/2023 (Originally 11/18/2019)   INFLUENZA VACCINE  08/19/2022   HEMOGLOBIN A1C  08/27/2022   OPHTHALMOLOGY EXAM  10/06/2022   Medicare Annual Wellness (AWV)  01/29/2023   Diabetic kidney evaluation - eGFR measurement  02/27/2023   Colonoscopy  07/08/2023   FOOT EXAM  07/19/2023   Pneumonia Vaccine 65+ Years  old  Completed   Hepatitis C Screening  Completed   HPV VACCINES  Aged Out          See Problem List for Assessment and Plan of chronic medical problems.

## 2022-07-18 NOTE — Patient Instructions (Addendum)
Blood work was ordered.   The lab is on the first floor.    Medications changes include :   none      Return in about 6 months (around 01/19/2023) for follow up.    Health Maintenance, Female Adopting a healthy lifestyle and getting preventive care are important in promoting health and wellness. Ask your health care provider about: The right schedule for you to have regular tests and exams. Things you can do on your own to prevent diseases and keep yourself healthy. What should I know about diet, weight, and exercise? Eat a healthy diet  Eat a diet that includes plenty of vegetables, fruits, low-fat dairy products, and lean protein. Do not eat a lot of foods that are high in solid fats, added sugars, or sodium. Maintain a healthy weight Body mass index (BMI) is used to identify weight problems. It estimates body fat based on height and weight. Your health care provider can help determine your BMI and help you achieve or maintain a healthy weight. Get regular exercise Get regular exercise. This is one of the most important things you can do for your health. Most adults should: Exercise for at least 150 minutes each week. The exercise should increase your heart rate and make you sweat (moderate-intensity exercise). Do strengthening exercises at least twice a week. This is in addition to the moderate-intensity exercise. Spend less time sitting. Even light physical activity can be beneficial. Watch cholesterol and blood lipids Have your blood tested for lipids and cholesterol at 72 years of age, then have this test every 5 years. Have your cholesterol levels checked more often if: Your lipid or cholesterol levels are high. You are older than 72 years of age. You are at high risk for heart disease. What should I know about cancer screening? Depending on your health history and family history, you may need to have cancer screening at various ages. This may include screening  for: Breast cancer. Cervical cancer. Colorectal cancer. Skin cancer. Lung cancer. What should I know about heart disease, diabetes, and high blood pressure? Blood pressure and heart disease High blood pressure causes heart disease and increases the risk of stroke. This is more likely to develop in people who have high blood pressure readings or are overweight. Have your blood pressure checked: Every 3-5 years if you are 83-57 years of age. Every year if you are 22 years old or older. Diabetes Have regular diabetes screenings. This checks your fasting blood sugar level. Have the screening done: Once every three years after age 34 if you are at a normal weight and have a low risk for diabetes. More often and at a younger age if you are overweight or have a high risk for diabetes. What should I know about preventing infection? Hepatitis B If you have a higher risk for hepatitis B, you should be screened for this virus. Talk with your health care provider to find out if you are at risk for hepatitis B infection. Hepatitis C Testing is recommended for: Everyone born from [birth year] through 1965. Anyone with known risk factors for hepatitis C. Sexually transmitted infections (STIs) Get screened for STIs, including gonorrhea and chlamydia, if: You are sexually active and are younger than 72 years of age. You are older than 72 years of age and your health care provider tells you that you are at risk for this type of infection. Your sexual activity has changed since you were last screened, and you  increased risk for chlamydia or gonorrhea. Ask your health care provider if you are at risk. Ask your health care provider about whether you are at high risk for HIV. Your health care provider may recommend a prescription medicine to help prevent HIV infection. If you choose to take medicine to prevent HIV, you should first get tested for HIV. You should then be tested every 3 months for as long as you  are taking the medicine. Pregnancy If you are about to stop having your period (premenopausal) and you may become pregnant, seek counseling before you get pregnant. Take 400 to 800 micrograms (mcg) of folic acid every day if you become pregnant. Ask for birth control (contraception) if you want to prevent pregnancy. Osteoporosis and menopause Osteoporosis is a disease in which the bones lose minerals and strength with aging. This can result in bone fractures. If you are 65 years old or older, or if you are at risk for osteoporosis and fractures, ask your health care provider if you should: Be screened for bone loss. Take a calcium or vitamin D supplement to lower your risk of fractures. Be given hormone replacement therapy (HRT) to treat symptoms of menopause. Follow these instructions at home: Alcohol use Do not drink alcohol if: Your health care provider tells you not to drink. You are pregnant, may be pregnant, or are planning to become pregnant. If you drink alcohol: Limit how much you have to: 0-1 drink a day. Know how much alcohol is in your drink. In the [place] one drink equals one 12 oz bottle of beer (355 mL), one 5 oz glass of wine (148 mL), or one 1 oz glass of hard liquor (44 mL). Lifestyle Do not use any products that contain nicotine or tobacco. These products include cigarettes, chewing tobacco, and vaping devices, such as e-cigarettes. If you need help quitting, ask your health care provider. Do not use street drugs. Do not share needles. Ask your health care provider for help if you need support or information about quitting drugs. General instructions Schedule regular health, dental, and eye exams. Stay current with your vaccines. Tell your health care provider if: You often feel depressed. You have ever been abused or do not feel safe at home. Summary Adopting a healthy lifestyle and getting preventive care are important in promoting health and wellness. Follow your  health care provider's instructions about healthy diet, exercising, and getting tested or screened for diseases. Follow your health care provider's instructions on monitoring your cholesterol and blood pressure. This information is not intended to replace advice given to you by your health care provider. Make sure you discuss any questions you have with your health care provider. Document Revised: 05/26/2020 Document Reviewed: 05/26/2020 Elsevier Patient Education  2024 [employer] Inc.  

## 2022-07-19 ENCOUNTER — Ambulatory Visit (INDEPENDENT_AMBULATORY_CARE_PROVIDER_SITE_OTHER): Payer: Medicare HMO | Admitting: Internal Medicine

## 2022-07-19 VITALS — BP 122/78 | HR 91 | Temp 98.4°F | Ht 63.0 in | Wt 253.0 lb

## 2022-07-19 DIAGNOSIS — E039 Hypothyroidism, unspecified: Secondary | ICD-10-CM | POA: Diagnosis not present

## 2022-07-19 DIAGNOSIS — I10 Essential (primary) hypertension: Secondary | ICD-10-CM | POA: Diagnosis not present

## 2022-07-19 DIAGNOSIS — E785 Hyperlipidemia, unspecified: Secondary | ICD-10-CM | POA: Diagnosis not present

## 2022-07-19 DIAGNOSIS — F3289 Other specified depressive episodes: Secondary | ICD-10-CM | POA: Diagnosis not present

## 2022-07-19 DIAGNOSIS — G4733 Obstructive sleep apnea (adult) (pediatric): Secondary | ICD-10-CM

## 2022-07-19 DIAGNOSIS — Z0000 Encounter for general adult medical examination without abnormal findings: Secondary | ICD-10-CM | POA: Diagnosis not present

## 2022-07-19 DIAGNOSIS — G479 Sleep disorder, unspecified: Secondary | ICD-10-CM

## 2022-07-19 DIAGNOSIS — M160 Bilateral primary osteoarthritis of hip: Secondary | ICD-10-CM | POA: Diagnosis not present

## 2022-07-19 DIAGNOSIS — E559 Vitamin D deficiency, unspecified: Secondary | ICD-10-CM

## 2022-07-19 DIAGNOSIS — E1151 Type 2 diabetes mellitus with diabetic peripheral angiopathy without gangrene: Secondary | ICD-10-CM

## 2022-07-19 DIAGNOSIS — Z7985 Long-term (current) use of injectable non-insulin antidiabetic drugs: Secondary | ICD-10-CM

## 2022-07-19 LAB — CBC WITH DIFFERENTIAL/PLATELET
Basophils Absolute: 0 10*3/uL (ref 0.0–0.1)
Basophils Relative: 0.5 % (ref 0.0–3.0)
Eosinophils Absolute: 0.1 10*3/uL (ref 0.0–0.7)
Eosinophils Relative: 1.5 % (ref 0.0–5.0)
HCT: 41.4 % (ref 36.0–46.0)
Hemoglobin: 13.5 g/dL (ref 12.0–15.0)
Lymphocytes Relative: 32.2 % (ref 12.0–46.0)
Lymphs Abs: 2.4 10*3/uL (ref 0.7–4.0)
MCHC: 32.6 g/dL (ref 30.0–36.0)
MCV: 88.7 fl (ref 78.0–100.0)
Monocytes Absolute: 0.4 10*3/uL (ref 0.1–1.0)
Monocytes Relative: 5.3 % (ref 3.0–12.0)
Neutro Abs: 4.4 10*3/uL (ref 1.4–7.7)
Neutrophils Relative %: 60.5 % (ref 43.0–77.0)
Platelets: 244 10*3/uL (ref 150.0–400.0)
RBC: 4.67 Mil/uL (ref 3.87–5.11)
RDW: 15 % (ref 11.5–15.5)
WBC: 7.3 10*3/uL (ref 4.0–10.5)

## 2022-07-19 LAB — COMPREHENSIVE METABOLIC PANEL
ALT: 11 U/L (ref 0–35)
AST: 13 U/L (ref 0–37)
Albumin: 4 g/dL (ref 3.5–5.2)
Alkaline Phosphatase: 60 U/L (ref 39–117)
BUN: 9 mg/dL (ref 6–23)
CO2: 26 mEq/L (ref 19–32)
Calcium: 9.6 mg/dL (ref 8.4–10.5)
Chloride: 107 mEq/L (ref 96–112)
Creatinine, Ser: 0.69 mg/dL (ref 0.40–1.20)
GFR: 87.19 mL/min (ref >60.00)
Glucose, Bld: 120 mg/dL — ABNORMAL HIGH (ref 70–99)
Potassium: 4.6 mEq/L (ref 3.5–5.1)
Sodium: 140 mEq/L (ref 135–145)
Total Bilirubin: 0.5 mg/dL (ref 0.2–1.2)
Total Protein: 6.9 g/dL (ref 6.0–8.3)

## 2022-07-19 LAB — MICROALBUMIN / CREATININE URINE RATIO
Creatinine,U: 93.5 mg/dL
Microalb Creat Ratio: 1.1 mg/g (ref 0.0–30.0)
Microalb, Ur: 1 mg/dL (ref 0.0–1.9)

## 2022-07-19 LAB — LIPID PANEL
Cholesterol: 155 mg/dL (ref 0–200)
HDL: 53.6 mg/dL (ref >39.00)
LDL Cholesterol: 81 mg/dL (ref 0–99)
NonHDL: 101.3
Total CHOL/HDL Ratio: 3
Triglycerides: 101 mg/dL (ref 0.0–149.0)
VLDL: 20.2 mg/dL (ref 0.0–40.0)

## 2022-07-19 LAB — HEMOGLOBIN A1C: Hgb A1c MFr Bld: 6 % (ref 4.6–6.5)

## 2022-07-19 LAB — TSH: TSH: 1.42 u[IU]/mL (ref 0.35–5.50)

## 2022-07-19 LAB — VITAMIN D 25 HYDROXY (VIT D DEFICIENCY, FRACTURES): VITD: 39.82 ng/mL (ref 30.00–100.00)

## 2022-07-19 NOTE — Assessment & Plan Note (Signed)
Chronic  Clinically euthyroid Check tsh and will titrate med dose if needed Currently taking levothyroxine 75 mcg daily 

## 2022-07-19 NOTE — Assessment & Plan Note (Signed)
Chronic CMP Continue losartan 50 mg daily Has intermittent lightheadedness when standing I advised her to monitor this closely and check BP so we can confirm it is not low BP problem in which case we would need to adjust her blood pressure medication dose

## 2022-07-19 NOTE — Assessment & Plan Note (Signed)
Chronic °Regular exercise and healthy diet encouraged °Check lipid panel  °Continue simvastatin 20 mg daily °

## 2022-07-19 NOTE — Assessment & Plan Note (Signed)
Chronic Taking vitamin D daily Check vitamin D level  

## 2022-07-19 NOTE — Assessment & Plan Note (Signed)
Chronic   Lab Results  Component Value Date   HGBA1C 7.2 (H) 02/26/2022   Sugars  controlled Check A1c-she will come in to get blood work in the next week or so Has a lot of nausea and constipation with ozempic - both tolerate Continue Ozempic 0.5 mg weekly Stressed regular exercise, diabetic diet

## 2022-07-19 NOTE — Assessment & Plan Note (Signed)
Chronic Sleep is better with the medication-typically gets about 5 hours Gets interrupted sleep Continue trazodone 100 mg nightly

## 2022-07-19 NOTE — Assessment & Plan Note (Signed)
Chronic Severe bilateral hip pain from primary osteoarthritis-she does not want surgery Pain improved with weight loss Pain medication does help, but still has significant pain Continue Tylenol 3-2 tablets twice daily

## 2022-07-19 NOTE — Assessment & Plan Note (Signed)
Chronic Controlled, Stable Continue sertraline 150 mg daily 

## 2022-07-19 NOTE — Assessment & Plan Note (Signed)
No longer using cpap - stopped 1 year ago Discussed risk of untreated OSA

## 2022-07-19 NOTE — Assessment & Plan Note (Signed)
Chronic Has lost significant amount of weight with Ozempic 0.5 mg weekly-continue.  She is experiencing nausea and constipation, but it is tolerable Encouraged as much activity as possible with severe bilateral hip arthritis Stressed eating diet high in protein and getting plenty of vegetables to make sure she is not becoming malnutrition Continue weight loss efforts

## 2022-07-21 ENCOUNTER — Other Ambulatory Visit: Payer: Self-pay | Admitting: Internal Medicine

## 2022-07-30 ENCOUNTER — Other Ambulatory Visit: Payer: Self-pay | Admitting: Internal Medicine

## 2022-08-25 ENCOUNTER — Other Ambulatory Visit: Payer: Self-pay | Admitting: Internal Medicine

## 2022-09-01 ENCOUNTER — Other Ambulatory Visit: Payer: Self-pay | Admitting: Internal Medicine

## 2022-09-20 ENCOUNTER — Other Ambulatory Visit: Payer: Self-pay | Admitting: Internal Medicine

## 2022-10-04 ENCOUNTER — Telehealth: Payer: Self-pay | Admitting: Neurology

## 2022-10-04 NOTE — Telephone Encounter (Signed)
Left message with the after hour service on 10-04-22 at 12:46 pm   Caller states that needs to set up delivery for patient

## 2022-10-21 ENCOUNTER — Ambulatory Visit: Payer: Medicare HMO | Admitting: Neurology

## 2022-10-23 ENCOUNTER — Other Ambulatory Visit: Payer: Self-pay | Admitting: Internal Medicine

## 2022-10-29 ENCOUNTER — Ambulatory Visit: Payer: Medicare HMO | Admitting: Neurology

## 2022-10-29 DIAGNOSIS — G43E09 Chronic migraine with aura, not intractable, without status migrainosus: Secondary | ICD-10-CM | POA: Diagnosis not present

## 2022-10-29 MED ORDER — ONABOTULINUMTOXINA 100 UNITS IJ SOLR
200.0000 [IU] | Freq: Once | INTRAMUSCULAR | Status: AC
Start: 2022-10-29 — End: 2022-10-29
  Administered 2022-10-29: 155 [IU] via INTRAMUSCULAR

## 2022-10-29 NOTE — Progress Notes (Signed)
[facility]  ° °Procedure Note Botox ° °Attending: Dr.   ° °Preoperative Diagnosis(es): Chronic migraine ° °Consent obtained from: The patient °Benefits discussed included, but were not limited to decreased muscle tightness, increased joint range of motion, and decreased pain.  Risk discussed included, but were not limited pain and discomfort, bleeding, bruising, excessive weakness, venous thrombosis, muscle atrophy and dysphagia.  Anticipated outcomes of the procedure as well as he risks and benefits of the alternatives to the procedure, and the roles and tasks of the personnel to be involved, were discussed with the patient, and the patient consents to the procedure and agrees to proceed. A copy of the patient medication guide was given to the patient which explains the blackbox warning. ° °Patients identity and treatment sites confirmed Yes.  . ° °Details of Procedure: °Skin was cleaned with alcohol. Prior to injection, the needle plunger was aspirated to make sure the needle was not within a blood vessel.  There was no blood retrieved on aspiration.   ° °Following is a summary of the muscles injected  And the amount of Botulinum toxin used: ° °Dilution °200 units of Botox was reconstituted with 4 ml of preservative free normal saline. °Time of reconstitution: At the time of the office visit (<30 minutes prior to injection)  ° °Injections  °155 total units of Botox was injected with a 30 gauge needle. ° °Injection Sites: °L occipitalis: 15 units- 3 sites  °R occiptalis: 15 units- 3 sites ° °L upper trapezius: 15 units- 3 sites °R upper trapezius: 15 units- 3 sits          °L paraspinal: 10 units- 2 sites °R paraspinal: 10 units- 2 sites ° °Face °L frontalis(2 injection sites):10 units   °R frontalis(2 injection sites):10 units         °L corrugator: 5 units   °R corrugator: 5 units           °Procerus: 5 units   °L temporalis: 20 units °R temporalis: 20 units  ° °Agent:  °200 units of botulinum Type  A (Onobotulinum Toxin type A) was reconstituted with 4 ml of preservative free normal saline.  °Time of reconstitution: At the time of the office visit (<30 minutes prior to injection)  ° ° ° Total injected (Units):  155 ° Total wasted (Units):  45 ° °Patient tolerated procedure well without complications.   °Reinjection is anticipated in 3 months. ° ° °

## 2022-11-23 ENCOUNTER — Other Ambulatory Visit: Payer: Self-pay | Admitting: Internal Medicine

## 2022-12-09 ENCOUNTER — Telehealth: Payer: Self-pay

## 2022-12-09 NOTE — Patient Outreach (Signed)
Attempted to contact patient regarding DM eye. Left voicemail for patient to return my call at [phone].  Nicholes Rough, CMA Care Guide [employer]

## 2022-12-17 ENCOUNTER — Encounter: Payer: Self-pay | Admitting: Internal Medicine

## 2022-12-20 ENCOUNTER — Telehealth: Payer: Self-pay

## 2022-12-20 ENCOUNTER — Other Ambulatory Visit (HOSPITAL_COMMUNITY): Payer: Self-pay

## 2022-12-20 NOTE — Telephone Encounter (Signed)
Pharmacy Patient Advocate Encounter   Received notification from Pt Calls Messages that prior authorization for Ozempic 4mg /26ml is required/requested.   Insurance verification completed.   The patient is insured through [employer] .   Per test claim: Refill too soon. Per CMM Ozempic is available without authorization.   Key: [id number]

## 2022-12-27 ENCOUNTER — Other Ambulatory Visit: Payer: Self-pay | Admitting: Internal Medicine

## 2022-12-30 NOTE — Telephone Encounter (Signed)
Completed.

## 2023-01-01 ENCOUNTER — Other Ambulatory Visit: Payer: Self-pay | Admitting: Internal Medicine

## 2023-01-14 ENCOUNTER — Telehealth: Payer: Self-pay

## 2023-01-14 ENCOUNTER — Telehealth: Payer: Self-pay | Admitting: Pharmacy Technician

## 2023-01-14 ENCOUNTER — Other Ambulatory Visit (HOSPITAL_COMMUNITY): Payer: Self-pay

## 2023-01-14 NOTE — Telephone Encounter (Signed)
PA needed for botox. PA expire 01/18/23 appt 02/04/23.

## 2023-01-14 NOTE — Telephone Encounter (Signed)
PA has been submitted, and telephone encounter has been created. 

## 2023-01-14 NOTE — Telephone Encounter (Signed)
Pharmacy Patient Advocate Encounter  [employer] verification has been submitted. Benefit Verification #:  [id number]  Pharmacy PA has been submitted for BOTOX 100u/200u: 100u via CoverMyMeds. INSURANCE: [employer] DATE SUBMITTED: 12.27.24 KEY: [id number] Status is pending

## 2023-01-15 ENCOUNTER — Other Ambulatory Visit: Payer: Self-pay | Admitting: Neurology

## 2023-01-16 ENCOUNTER — Other Ambulatory Visit (HOSPITAL_COMMUNITY): Payer: Self-pay

## 2023-01-16 NOTE — Telephone Encounter (Signed)
Pharmacy Patient Advocate Encounter  Received notification from [employer] that Prior Authorization for BOTOX 200 has been APPROVED from 1.1.24 to 12.31.25   PA #/Case ID/Reference #: [id number]

## 2023-01-18 ENCOUNTER — Encounter: Payer: Self-pay | Admitting: Internal Medicine

## 2023-01-18 NOTE — Progress Notes (Unsigned)
 Subjective:    Patient ID: Alexis Grant, female    DOB: [birth year], 72 y.o.   MRN: [id number]     HPI Alexis Grant is here for follow up of her chronic medical problems.    Medications and allergies reviewed with patient and updated if appropriate.  Current Outpatient Medications on File Prior to Visit  Medication Sig Dispense Refill  . acetaminophen (TYLENOL) 500 MG tablet Take 1 tablet (500 mg total) by mouth every 6 (six) hours as needed. 30 tablet 3  . albuterol (VENTOLIN HFA) 108 (90 Base) MCG/ACT inhaler Inhale 2 puffs into the lungs every 6 (six) hours as needed for wheezing or shortness of breath. 8 g 5  . Alcohol Swabs (DROPSAFE ALCOHOL PREP) 70 % PADS USE AS DIRECTED TO CHECK SUGAR 100 each 3  . Blood Glucose Calibration (TRUE METRIX LEVEL 1) Low SOLN Use when checking blood sugar QID and PRN as QC DX.E11.9 1 each 1  . blood glucose meter kit and supplies KIT Dispense based on patient and insurance preference. Use up to four times daily as directed. (FOR E11.9). 1 each 0  . Blood Glucose Monitoring Suppl (TRUE METRIX AIR GLUCOSE METER) DEVI Use to check blood glucose QID and PRN DX E11.9 1 each 1  . botulinum toxin Type A (BOTOX) 200 units injection Inject 155 units IM into multiple site in the face,neck and head once every 90 days 1 each 4  . cholecalciferol (VITAMIN D3) 25 MCG (1000 UNIT) tablet Take 2,000 Units by mouth daily.    . psyllium (METAMUCIL) 0.52 g capsule Take 1 capsule (0.52 g total) by mouth daily. 30 capsule 1  . sertraline (ZOLOFT) 100 MG tablet TAKE 1 AND 1/2 TABLETS EVERY DAY 135 tablet 3  . simvastatin (ZOCOR) 20 MG tablet TAKE 1 TABLET AT BEDTIME 90 tablet 3  . traZODone (DESYREL) 50 MG tablet TAKE 2 TABLETS AT BEDTIME FOR SLEEP 180 tablet 3  . TRUE METRIX BLOOD GLUCOSE TEST test strip TEST BLOOD SUGAR FOUR TIMES DAILY AND AS NEEDED 450 strip 3  . TRUEplus Lancets 33G MISC TEST FOUR TIMES DAILY AND AS NEEDED 500 each 3   No current  facility-administered medications on file prior to visit.     Review of Systems     Objective:  There were no vitals filed for this visit. BP Readings from Last 3 Encounters:  02/21/23 113/67  01/31/23 112/74  07/19/22 122/78   Wt Readings from Last 3 Encounters:  02/21/23 266 lb 9.6 oz (120.9 kg)  02/01/23 263 lb (119.3 kg)  01/31/23 263 lb (119.3 kg)   There is no height or weight on file to calculate BMI.    Physical Exam     Lab Results  Component Value Date   WBC 7.3 07/19/2022   HGB 13.5 07/19/2022   HCT 41.4 07/19/2022   PLT 244.0 07/19/2022   GLUCOSE 128 (H) 01/31/2023   CHOL 192 01/31/2023   TRIG 128.0 01/31/2023   HDL 61.80 01/31/2023   LDLCALC 104 (H) 01/31/2023   ALT 11 01/31/2023   AST 13 01/31/2023   NA 137 01/31/2023   K 3.8 01/31/2023   CL 104 01/31/2023   CREATININE 0.80 01/31/2023   BUN 10 01/31/2023   CO2 25 01/31/2023   TSH 1.50 01/31/2023   INR 1.17 11/09/2008   HGBA1C 7.4 (H) 01/31/2023   MICROALBUR 16.5 01/31/2023     Assessment & Plan:    See Problem List for Assessment  and Plan of chronic medical problems.    This encounter was created in error - please disregard.

## 2023-01-18 NOTE — Patient Instructions (Addendum)
      Blood work was ordered.       Medications changes include :   None    A referral was ordered and someone will call you to schedule an appointment.     Return in about 6 months (around 07/24/2023) for Physical Exam.

## 2023-01-20 ENCOUNTER — Other Ambulatory Visit (HOSPITAL_COMMUNITY): Payer: Self-pay

## 2023-01-20 ENCOUNTER — Telehealth: Payer: Self-pay

## 2023-01-20 NOTE — Telephone Encounter (Signed)
 Pharmacy Patient Advocate Encounter   Received notification from Pt Calls Messages that prior authorization for Ozempic  4mg /100ml is required/requested.   Insurance verification completed.   The patient is insured through [employer] .   Per test claim: PA required; PA submitted to above mentioned insurance via CoverMyMeds Key/confirmation #/EOC [id number] Status is pending

## 2023-01-20 NOTE — Telephone Encounter (Signed)
 Pharmacy Patient Advocate Encounter  Received notification from [facility] that Prior Authorization for Ozempic 4mg /52ml has been CANCELLED due to the medication is available without a prior authorization.

## 2023-01-20 NOTE — Telephone Encounter (Signed)
 Sent message to patient today via my-chart.

## 2023-01-24 ENCOUNTER — Other Ambulatory Visit: Payer: Self-pay | Admitting: Internal Medicine

## 2023-01-24 ENCOUNTER — Encounter: Payer: Medicare HMO | Admitting: Internal Medicine

## 2023-01-24 DIAGNOSIS — E559 Vitamin D deficiency, unspecified: Secondary | ICD-10-CM

## 2023-01-24 DIAGNOSIS — E1151 Type 2 diabetes mellitus with diabetic peripheral angiopathy without gangrene: Secondary | ICD-10-CM

## 2023-01-24 DIAGNOSIS — E039 Hypothyroidism, unspecified: Secondary | ICD-10-CM

## 2023-01-24 DIAGNOSIS — E785 Hyperlipidemia, unspecified: Secondary | ICD-10-CM

## 2023-01-24 DIAGNOSIS — I10 Essential (primary) hypertension: Secondary | ICD-10-CM

## 2023-01-24 DIAGNOSIS — F3289 Other specified depressive episodes: Secondary | ICD-10-CM

## 2023-01-24 DIAGNOSIS — M160 Bilateral primary osteoarthritis of hip: Secondary | ICD-10-CM

## 2023-01-30 DIAGNOSIS — G8929 Other chronic pain: Secondary | ICD-10-CM | POA: Insufficient documentation

## 2023-01-30 NOTE — Progress Notes (Signed)
 Subjective:    Patient ID: Alexis Grant, female    DOB: November 30, 1950, 73 y.o.   MRN: [id number]     HPI Alexis Grant is here for follow up of her chronic medical problems.  Her grandson was diagnosed with the flu.  She got her cold from him.  This is the second week.   Getting better slowly.   She is taking mucinex , cough drops.       Medications and allergies reviewed with patient and updated if appropriate.  Current Outpatient Medications on File Prior to Visit  Medication Sig Dispense Refill   acetaminophen  (TYLENOL ) 500 MG tablet Take 1 tablet (500 mg total) by mouth every 6 (six) hours as needed. 30 tablet 3   acetaminophen -codeine  (TYLENOL  #3) 300-30 MG tablet TAKE 2 TABLETS TWICE DAILY AS NEEDED FOR MODERATE PAIN 120 tablet 0   albuterol  (VENTOLIN  HFA) 108 (90 Base) MCG/ACT inhaler Inhale 2 puffs into the lungs every 6 (six) hours as needed for wheezing or shortness of breath. 8 g 5   Alcohol Swabs (DROPSAFE ALCOHOL PREP) 70 % PADS USE AS DIRECTED TO CHECK SUGAR 100 each 3   Blood Glucose Calibration (TRUE METRIX LEVEL 1) Low SOLN Use when checking blood sugar QID and PRN as QC DX.E11.9 1 each 1   blood glucose meter kit and supplies KIT Dispense based on patient and insurance preference. Use up to four times daily as directed. (FOR E11.9). 1 each 0   Blood Glucose Monitoring Suppl (TRUE METRIX AIR GLUCOSE METER) DEVI Use to check blood glucose QID and PRN DX E11.9 1 each 1   botulinum toxin Type A  (BOTOX ) 200 units injection Inject 155 units IM into multiple site in the face,neck and head once every 90 days 1 each 4   cholecalciferol (VITAMIN D3) 25 MCG (1000 UNIT) tablet Take 2,000 Units by mouth daily.     levothyroxine  (SYNTHROID ) 75 MCG tablet TAKE 1 TABLET EVERY DAY BEFORE BREAKFAST 90 tablet 3   losartan  (COZAAR ) 50 MG tablet TAKE 1 TABLET EVERY DAY 90 tablet 3   psyllium (METAMUCIL) 0.52 g capsule Take 1 capsule (0.52 g total) by mouth daily. 30 capsule 1    Semaglutide , 1 MG/DOSE, (OZEMPIC , 1 MG/DOSE,) 4 MG/3ML SOPN INJECT 1MG  UNDER THE SKIN ONE TIME WEEKLY AS DIRECTED 9 mL 3   sertraline  (ZOLOFT ) 100 MG tablet TAKE 1 AND 1/2 TABLETS EVERY DAY 135 tablet 3   simvastatin  (ZOCOR ) 20 MG tablet TAKE 1 TABLET AT BEDTIME 90 tablet 3   topiramate  (TOPAMAX ) 200 MG tablet TAKE 1 TABLET TWICE DAILY 180 tablet 0   traZODone  (DESYREL ) 50 MG tablet TAKE 2 TABLETS AT BEDTIME FOR SLEEP 180 tablet 3   TRUE METRIX BLOOD GLUCOSE TEST test strip TEST BLOOD SUGAR FOUR TIMES DAILY AND AS NEEDED 450 strip 3   TRUEplus Lancets 33G MISC TEST FOUR TIMES DAILY AND AS NEEDED 500 each 3   No current facility-administered medications on file prior to visit.     Review of Systems  Constitutional:  Positive for appetite change (decreased) and chills. Negative for fever.  HENT:  Positive for congestion, ear pain, sinus pressure, sinus pain and sore throat (resolved).   Respiratory:  Positive for cough (wet - improving) and wheezing. Negative for shortness of breath.   Cardiovascular:  Negative for chest pain, palpitations and leg swelling.  Neurological:  Positive for dizziness and headaches.       Objective:   Vitals:   01/31/23  1418  BP: 112/74  Pulse: 70  Temp: 98.4 F (36.9 C)  SpO2: 96%   BP Readings from Last 3 Encounters:  01/31/23 112/74  07/19/22 122/78  08/17/21 118/84   Wt Readings from Last 3 Encounters:  01/31/23 263 lb (119.3 kg)  07/19/22 253 lb (114.8 kg)  01/28/22 292 lb (132.5 kg)   Body mass index is 46.59 kg/m.    Physical Exam Constitutional:      General: She is not in acute distress.    Appearance: Normal appearance. She is not ill-appearing.  HENT:     Head: Normocephalic and atraumatic.     Right Ear: Tympanic membrane, ear canal and external ear normal.     Left Ear: Tympanic membrane, ear canal and external ear normal.     Mouth/Throat:     Mouth: Mucous membranes are moist.     Pharynx: No oropharyngeal exudate or  posterior oropharyngeal erythema.  Eyes:     Conjunctiva/sclera: Conjunctivae normal.  Cardiovascular:     Rate and Rhythm: Normal rate and regular rhythm.     Heart sounds: Normal heart sounds.  Pulmonary:     Effort: Pulmonary effort is normal. No respiratory distress.     Breath sounds: Normal breath sounds. No wheezing or rales.  Musculoskeletal:     Cervical back: Neck supple. No tenderness.     Right lower leg: No edema.     Left lower leg: No edema.  Lymphadenopathy:     Cervical: No cervical adenopathy.  Skin:    General: Skin is warm and dry.     Findings: No rash.  Neurological:     Mental Status: She is alert. Mental status is at baseline.  Psychiatric:        Mood and Affect: Mood normal.        Behavior: Behavior normal.        Lab Results  Component Value Date   WBC 7.3 07/19/2022   HGB 13.5 07/19/2022   HCT 41.4 07/19/2022   PLT 244.0 07/19/2022   GLUCOSE 120 (H) 07/19/2022   CHOL 155 07/19/2022   TRIG 101.0 07/19/2022   HDL 53.60 07/19/2022   LDLCALC 81 07/19/2022   ALT 11 07/19/2022   AST 13 07/19/2022   NA 140 07/19/2022   K 4.6 07/19/2022   CL 107 07/19/2022   CREATININE 0.69 07/19/2022   BUN 9 07/19/2022   CO2 26 07/19/2022   TSH 1.42 07/19/2022   INR 1.17 11/09/2008   HGBA1C 6.0 07/19/2022   MICROALBUR 1.0 07/19/2022     Assessment & Plan:    See Problem List for Assessment and Plan of chronic medical problems.

## 2023-01-30 NOTE — Patient Instructions (Addendum)
      Blood work was ordered.       Medications changes include :   None      Return in about 6 months (around 07/31/2023) for Physical Exam.

## 2023-01-31 ENCOUNTER — Ambulatory Visit (INDEPENDENT_AMBULATORY_CARE_PROVIDER_SITE_OTHER): Payer: Medicare HMO | Admitting: Internal Medicine

## 2023-01-31 ENCOUNTER — Ambulatory Visit (INDEPENDENT_AMBULATORY_CARE_PROVIDER_SITE_OTHER): Payer: Medicare HMO

## 2023-01-31 ENCOUNTER — Encounter: Payer: Self-pay | Admitting: Internal Medicine

## 2023-01-31 VITALS — BP 112/74 | HR 70 | Temp 98.4°F | Ht 63.0 in | Wt 263.0 lb

## 2023-01-31 DIAGNOSIS — I10 Essential (primary) hypertension: Secondary | ICD-10-CM | POA: Diagnosis not present

## 2023-01-31 DIAGNOSIS — G40309 Generalized idiopathic epilepsy and epileptic syndromes, not intractable, without status epilepticus: Secondary | ICD-10-CM

## 2023-01-31 DIAGNOSIS — E785 Hyperlipidemia, unspecified: Secondary | ICD-10-CM | POA: Diagnosis not present

## 2023-01-31 DIAGNOSIS — Z7985 Long-term (current) use of injectable non-insulin antidiabetic drugs: Secondary | ICD-10-CM

## 2023-01-31 DIAGNOSIS — G479 Sleep disorder, unspecified: Secondary | ICD-10-CM

## 2023-01-31 DIAGNOSIS — G43109 Migraine with aura, not intractable, without status migrainosus: Secondary | ICD-10-CM

## 2023-01-31 DIAGNOSIS — G8929 Other chronic pain: Secondary | ICD-10-CM | POA: Diagnosis not present

## 2023-01-31 DIAGNOSIS — J111 Influenza due to unidentified influenza virus with other respiratory manifestations: Secondary | ICD-10-CM | POA: Diagnosis not present

## 2023-01-31 DIAGNOSIS — E039 Hypothyroidism, unspecified: Secondary | ICD-10-CM

## 2023-01-31 DIAGNOSIS — E559 Vitamin D deficiency, unspecified: Secondary | ICD-10-CM

## 2023-01-31 DIAGNOSIS — E1151 Type 2 diabetes mellitus with diabetic peripheral angiopathy without gangrene: Secondary | ICD-10-CM

## 2023-01-31 DIAGNOSIS — R059 Cough, unspecified: Secondary | ICD-10-CM | POA: Diagnosis not present

## 2023-01-31 DIAGNOSIS — R062 Wheezing: Secondary | ICD-10-CM | POA: Diagnosis not present

## 2023-01-31 DIAGNOSIS — M160 Bilateral primary osteoarthritis of hip: Secondary | ICD-10-CM

## 2023-01-31 LAB — LIPID PANEL
Cholesterol: 192 mg/dL (ref 0–200)
HDL: 61.8 mg/dL (ref >39.00)
LDL Cholesterol: 104 mg/dL — ABNORMAL HIGH (ref 0–99)
NonHDL: 129.96
Total CHOL/HDL Ratio: 3
Triglycerides: 128 mg/dL (ref 0.0–149.0)
VLDL: 25.6 mg/dL (ref 0.0–40.0)

## 2023-01-31 LAB — COMPREHENSIVE METABOLIC PANEL
ALT: 11 U/L (ref 0–35)
AST: 13 U/L (ref 0–37)
Albumin: 4.4 g/dL (ref 3.5–5.2)
Alkaline Phosphatase: 76 U/L (ref 39–117)
BUN: 10 mg/dL (ref 6–23)
CO2: 25 meq/L (ref 19–32)
Calcium: 10 mg/dL (ref 8.4–10.5)
Chloride: 104 meq/L (ref 96–112)
Creatinine, Ser: 0.8 mg/dL (ref 0.40–1.20)
GFR: 73.75 mL/min (ref >60.00)
Glucose, Bld: 128 mg/dL — ABNORMAL HIGH (ref 70–99)
Potassium: 3.8 meq/L (ref 3.5–5.1)
Sodium: 137 meq/L (ref 135–145)
Total Bilirubin: 0.5 mg/dL (ref 0.2–1.2)
Total Protein: 7.6 g/dL (ref 6.0–8.3)

## 2023-01-31 LAB — HEMOGLOBIN A1C: Hgb A1c MFr Bld: 7.4 % — ABNORMAL HIGH (ref 4.6–6.5)

## 2023-01-31 LAB — TSH: TSH: 1.5 u[IU]/mL (ref 0.35–5.50)

## 2023-01-31 NOTE — Assessment & Plan Note (Signed)
Chronic °Regular exercise and healthy diet encouraged °Check lipid panel  °Continue simvastatin 20 mg daily °

## 2023-01-31 NOTE — Assessment & Plan Note (Signed)
 Chronic Severe bilateral hip pain from primary osteoarthritis-she does not want surgery Pain improved with weight loss-continue weight loss efforts Pain medication does help, but still has significant pain Continue Tylenol # 3-2 tablets twice daily

## 2023-01-31 NOTE — Assessment & Plan Note (Signed)
 Chronic  Lab Results  Component Value Date   HGBA1C 6.0 07/19/2022   Sugars  controlled Check A1c, urine microalbumin Continue Ozempic 1 mg weekly Stressed regular exercise, diabetic diet

## 2023-01-31 NOTE — Assessment & Plan Note (Signed)
 Chronic Controlled Following with neurology On topamax 200 mg bid Getting Botox

## 2023-01-31 NOTE — Assessment & Plan Note (Signed)
Chronic  Clinically euthyroid Check tsh and will titrate med dose if needed Currently taking levothyroxine 75 mcg daily  

## 2023-01-31 NOTE — Assessment & Plan Note (Signed)
 Chronic Following with neurology Has been seizure-free since 2002 on topiramate 200 mg twice daily

## 2023-01-31 NOTE — Assessment & Plan Note (Signed)
 Acute Grandson had flu and she got sick - was not seen - presumed flu Slowly improving, but productive cough Will get cxr to r/o PNA Continue symptomatic treatment

## 2023-01-31 NOTE — Assessment & Plan Note (Signed)
Chronic Taking vitamin D daily 

## 2023-01-31 NOTE — Assessment & Plan Note (Addendum)
Chronic CMP Continue losartan 50 mg daily

## 2023-01-31 NOTE — Assessment & Plan Note (Signed)
Chronic Sleep is better with the medication-typically gets about 5 hours Gets interrupted sleep Continue trazodone 100 mg nightly

## 2023-01-31 NOTE — Assessment & Plan Note (Signed)
 Chronic bilateral hip pain from end-stage primary osteoarthritis Does not want surgery Pain management with Tylenol  # 3 2 tabs twice daily Pain contract signed today U tox today [place]  pain management database checked.  She is taking the medication appropriately Denies side effects Continue current pain regimen

## 2023-01-31 NOTE — Assessment & Plan Note (Addendum)
 Chronic Has gained some weight - encouraged weight loss Has lost significant amount of weight with Ozempic  -continue.   Encouraged as much activity as possible with severe bilateral hip arthritis Stressed eating diet high in protein and getting plenty of vegetables to make sure she is not becoming malnutrition

## 2023-02-01 ENCOUNTER — Ambulatory Visit (INDEPENDENT_AMBULATORY_CARE_PROVIDER_SITE_OTHER): Payer: Medicare HMO

## 2023-02-01 VITALS — Ht 63.0 in | Wt 263.0 lb

## 2023-02-01 DIAGNOSIS — Z0000 Encounter for general adult medical examination without abnormal findings: Secondary | ICD-10-CM | POA: Diagnosis not present

## 2023-02-01 NOTE — Progress Notes (Signed)
 Subjective:   Alexis Grant is a 73 y.o. female who presents for Medicare Annual (Subsequent) preventive examination.  Visit Complete: Virtual I connected with  Alexis Grant on 02/01/23 by a audio enabled telemedicine application and verified that I am speaking with the correct person using two identifiers.  Patient Location: Home  Provider Location: Home Office  I discussed the limitations of evaluation and management by telemedicine. The patient expressed understanding and agreed to proceed.  Vital Signs: Because this visit was a virtual/telehealth visit, some criteria may be missing or patient reported. Any vitals not documented were not able to be obtained and vitals that have been documented are patient reported.  Patient Medicare AWV questionnaire was completed by the patient on 01/28/23; I have confirmed that all information answered by patient is correct and no changes since this date.  Cardiac Risk Factors include: advanced age (>9men, >81 women);diabetes mellitus;Other (see comment);hypertension;dyslipidemia;obesity (BMI >30kg/m2), Risk factor comments: OSA, Fatty Liver, Myocardial infarction, Osteopenia, Fibromyalgia     Objective:    Today's Vitals   01/28/23 1334 02/01/23 0846  Weight:  263 lb (119.3 kg)  Height:  5' 3 (1.6 m)  PainSc: 6     Body mass index is 46.59 kg/m.     02/01/2023    9:40 AM 02/19/2022    1:09 PM 01/28/2022    2:50 PM 07/28/2021    1:44 PM 04/15/2021   10:03 AM 01/27/2021    2:08 PM 10/13/2020   12:59 PM  Advanced Directives  Does Patient Have a Medical Advance Directive? Yes Yes Yes Yes Yes Yes Yes  Type of Estate Agent of [place];Living will Healthcare Power of [place];Living will;Out of facility DNR (pink MOST or yellow form) Healthcare Power of [place];Living will Healthcare Power of [place];Living will;Out of facility DNR (pink MOST or yellow form) Healthcare Power of [place];Living will;Out of facility DNR  (pink MOST or yellow form) Living will;Healthcare Power of [place] of [place];Living will;Out of facility DNR (pink MOST or yellow form)  Does patient want to make changes to medical advance directive?      No - Patient declined   Copy of Healthcare Power of Attorney in Chart? No - copy requested  No - copy requested   No - copy requested     Current Medications (verified) Outpatient Encounter Medications as of 02/01/2023  Medication Sig   acetaminophen  (TYLENOL ) 500 MG tablet Take 1 tablet (500 mg total) by mouth every 6 (six) hours as needed.   acetaminophen -codeine  (TYLENOL  #3) 300-30 MG tablet TAKE 2 TABLETS TWICE DAILY AS NEEDED FOR MODERATE PAIN   albuterol  (VENTOLIN  HFA) 108 (90 Base) MCG/ACT inhaler Inhale 2 puffs into the lungs every 6 (six) hours as needed for wheezing or shortness of breath.   Alcohol Swabs (DROPSAFE ALCOHOL PREP) 70 % PADS USE AS DIRECTED TO CHECK SUGAR   Blood Glucose Calibration (TRUE METRIX LEVEL 1) Low SOLN Use when checking blood sugar QID and PRN as QC DX.E11.9   blood glucose meter kit and supplies KIT Dispense based on patient and insurance preference. Use up to four times daily as directed. (FOR E11.9).   Blood Glucose Monitoring Suppl (TRUE METRIX AIR GLUCOSE METER) DEVI Use to check blood glucose QID and PRN DX E11.9   botulinum toxin Type A  (BOTOX ) 200 units injection Inject 155 units IM into multiple site in the face,neck and head once every 90 days   cholecalciferol (VITAMIN D3) 25 MCG (1000 UNIT) tablet Take  2,000 Units by mouth daily.   levothyroxine  (SYNTHROID ) 75 MCG tablet TAKE 1 TABLET EVERY DAY BEFORE BREAKFAST   losartan  (COZAAR ) 50 MG tablet TAKE 1 TABLET EVERY DAY   psyllium (METAMUCIL) 0.52 g capsule Take 1 capsule (0.52 g total) by mouth daily.   Semaglutide , 1 MG/DOSE, (OZEMPIC , 1 MG/DOSE,) 4 MG/3ML SOPN INJECT 1MG  UNDER THE SKIN ONE TIME WEEKLY AS DIRECTED   sertraline  (ZOLOFT ) 100 MG tablet TAKE 1 AND 1/2 TABLETS EVERY  DAY   simvastatin  (ZOCOR ) 20 MG tablet TAKE 1 TABLET AT BEDTIME   topiramate  (TOPAMAX ) 200 MG tablet TAKE 1 TABLET TWICE DAILY   traZODone  (DESYREL ) 50 MG tablet TAKE 2 TABLETS AT BEDTIME FOR SLEEP   TRUE METRIX BLOOD GLUCOSE TEST test strip TEST BLOOD SUGAR FOUR TIMES DAILY AND AS NEEDED   TRUEplus Lancets 33G MISC TEST FOUR TIMES DAILY AND AS NEEDED   No facility-administered encounter medications on file as of 02/01/2023.    Allergies (verified) Hydrocodone -acetaminophen , Jardiance  [empagliflozin ], and Metformin  and related   History: Past Medical History:  Diagnosis Date   Adenomatous polyp of colon 2008   Allergy    Anemia    Anxiety    Arthritis    Blood transfusion without reported diagnosis    following knee surgery    Cataract    hx- removed both eyes   Depression    Diabetes mellitus    type II    Dyslipidemia    Fibromyalgia    Gallstones    Gastritis    GERD (gastroesophageal reflux disease)    Hemangioma    Hiatal hernia    Fibromyalgia   Hyperlipidemia    Hypertension    Hypothyroidism 2010   Dr.Deveshawr   IBS (irritable bowel syndrome)    Dr Aneita   Migraines    Myocardial infarct [facility]) 2010   subendocardrial, following R TKR   Obesity    OSA (obstructive sleep apnea)    cpap - does not know settings    Pneumonia    Seizure disorder (HCC)    Seizures (HCC)    last seizure was 2002 per pt 07-29-16   Sleep apnea    Urinary tract infection    Past Surgical History:  Procedure Laterality Date   ABDOMINAL HYSTERECTOMY  1988   CARDIAC CATHETERIZATION  2007   DrGamble ( now seeing Dr Anner ANGLES cardiology)   CHOLECYSTECTOMY  1985   COLONOSCOPY     COLONOSCOPY WITH PROPOFOL  N/A 07/16/2014   Procedure: COLONOSCOPY WITH PROPOFOL ;  Surgeon: Gwendlyn ONEIDA Aneita, MD;  Location: [facility] END[facility];  Service: Endoscopy;  Laterality: N/A;   COLONOSCOPY WITH PROPOFOL  N/A 09/21/2016   Procedure: COLONOSCOPY WITH PROPOFOL ;  Surgeon: Aneita Gwendlyn ONEIDA, MD;  Location: [facility]  ENDOSCOPY;  Service: Endoscopy;  Laterality: N/A;   POLYPECTOMY  2012   6 or 7 adenomatous, Dr.Stark   steroid injection to left si joint  12/2008   Dr.Newton    TOTAL KNEE ARTHROPLASTY Right 11/2005   TOTAL KNEE ARTHROPLASTY Left    TUBAL LIGATION  1986   Family History  Problem Relation Age of Onset   Colon polyps Mother    Ulcers Father    Colon polyps Brother    Esophageal cancer Brother    Diabetes Sister    Kidney failure Sister    Diabetes Paternal Aunt    Esophageal cancer Brother    Kidney cancer Sister    Renal cancer Sister    Colon cancer Neg Hx    Heart  attack Neg Hx    Stroke Neg Hx    Cancer Neg Hx    Gallbladder disease Neg Hx    Rectal cancer Neg Hx    Stomach cancer Neg Hx    Social History   Socioeconomic History   Marital status: Widowed    Spouse name: Not on file   Number of children: 3   Years of education: college   Highest education level: Some college, no degree  Occupational History   Occupation: Retired  Tobacco Use   Smoking status: Never   Smokeless tobacco: Never  Vaping Use   Vaping status: Never Used  Substance and Sexual Activity   Alcohol use: No    Alcohol/week: 0.0 standard drinks of alcohol   Drug use: No   Sexual activity: Not Currently  Other Topics Concern   Not on file  Social History Narrative   Illicit drug use- no   Patient does not get regular exercise due to knee.   Patient lives with 2 adult grandsons-2025   Patient son and wife lives next door.   Patient has 3 children.    Patient has some college.    Patient retired May 2014.    Social Drivers of Corporate Investment Banker Strain: Low Risk  (02/01/2023)   Overall Financial Resource Strain (CARDIA)    Difficulty of Paying Living Expenses: Not very hard  Food Insecurity: No Food Insecurity (02/01/2023)   Hunger Vital Sign    Worried About Running Out of Food in the Last Year: Never true    Ran Out of Food in the Last Year: Never true  Transportation  Needs: No Transportation Needs (02/01/2023)   PRAPARE - Administrator, Civil Service (Medical): No    Lack of Transportation (Non-Medical): No  Physical Activity: Inactive (02/01/2023)   Exercise Vital Sign    Days of Exercise per Week: 0 days    Minutes of Exercise per Session: 0 min  Stress: No Stress Concern Present (02/01/2023)   [employer] of Occupational Health - Occupational Stress Questionnaire    Feeling of Stress : Not at all  Recent Concern: Stress - Stress Concern Present (01/27/2023)   [employer] of Occupational Health - Occupational Stress Questionnaire    Feeling of Stress : Rather much  Social Connections: Socially Isolated (02/01/2023)   Social Connection and Isolation Panel [NHANES]    Frequency of Communication with Friends and Family: More than three times a week    Frequency of Social Gatherings with Friends and Family: Once a week    Attends Religious Services: Never    Database Administrator or Organizations: No    Attends Banker Meetings: Never    Marital Status: Widowed    Tobacco Counseling Counseling given: Not Answered   Clinical Intake:  Pre-visit preparation completed: Yes  Pain : 0-10 Pain Score: 6  Pain Type: Chronic pain Pain Location:  (all over body pain from osteoarthritis) Pain Radiating Towards: all over body -per pt Pain Onset: More than a month ago Pain Frequency: Constant Pain Relieving Factors: Tylenol  3  Pain Relieving Factors: Tylenol  3  BMI - recorded: 46.59 Nutritional Status: BMI > 30  Obese Nutritional Risks: None Diabetes: Yes CBG done?: No Did pt. bring in CBG monitor from home?: No  How often do you need to have someone help you when you read instructions, pamphlets, or other written materials from your doctor or pharmacy?: 1 - Never  Interpreter Needed?: No  Information entered by :: Trinna Broach, RMA   Activities of Daily Living    01/28/2023    1:34 PM  In your present  state of health, do you have any difficulty performing the following activities:  Hearing? 0  Vision? 0  Difficulty concentrating or making decisions? 0  Walking or climbing stairs? 1  Dressing or bathing? 0  Doing errands, shopping? 1  Preparing Food and eating ? N  Using the Toilet? N  In the past six months, have you accidently leaked urine? Y  Do you have problems with loss of bowel control? N  Managing your Medications? N  Managing your Finances? N  Housekeeping or managing your Housekeeping? Y    Patient Care Team: Geofm Glade PARAS, MD as PCP - General (Internal Medicine) Georjean Darice HERO, MD as Consulting Physician (Neurology) Anderson Maude ORN, MD (Inactive) as Consulting Physician (Orthopedic Surgery) Shellia Oh, MD (Inactive) as Consulting Physician (Pulmonary Disease) Cleatus Collar, MD as Consulting Physician (Ophthalmology) Georjean Darice HERO, MD as Consulting Physician (Neurology) Szabat, Toribio BROCKS, [facility] (Inactive) (Pharmacist)  Indicate any recent Medical Services you may have received from other than [facility] providers in the past year (date may be approximate).     Assessment:   This is a routine wellness examination for [place].  Hearing/Vision screen Hearing Screening - Comments:: Denies hearing difficulties   Vision Screening - Comments:: Wears eyeglasses   Goals Addressed             This Visit's Progress    Client understands the importance of follow-up with providers by attending scheduled visits.   On track     Depression Screen    02/01/2023    9:44 AM 01/31/2023    2:24 PM 07/19/2022    2:43 PM 01/28/2022    2:58 PM 08/17/2021    3:55 PM 01/27/2021    2:06 PM 11/27/2019   11:02 AM  PHQ 2/9 Scores  PHQ - 2 Score 1 1 2 6 6 3 1   PHQ- 9 Score 6  6 18 18       Fall Risk    01/31/2023    2:24 PM 01/28/2023    1:34 PM 07/19/2022    2:43 PM 02/19/2022    1:09 PM 01/28/2022    2:51 PM  Fall Risk   Falls in the past year? 0 1 0 1 1  Number falls in past yr:  0 1 0 1 1  Injury with Fall? 0 0 0 0 1  Risk for fall due to : No Fall Risks  No Fall Risks  History of fall(s);Impaired balance/gait;Orthopedic patient  Follow up Falls evaluation completed Falls evaluation completed;Falls prevention discussed Falls evaluation completed Falls evaluation completed Education provided;Falls prevention discussed    MEDICARE RISK AT HOME: Medicare Risk at Home Any stairs in or around the home?: (Patient-Rptd) No If so, are there any without handrails?: (Patient-Rptd) No Home free of loose throw rugs in walkways, pet beds, electrical cords, etc?: (Patient-Rptd) Yes Adequate lighting in your home to reduce risk of falls?: (Patient-Rptd) Yes Life alert?: (Patient-Rptd) No Use of a cane, walker or w/c?: (Patient-Rptd) Yes Grab bars in the bathroom?: (Patient-Rptd) Yes Shower chair or bench in shower?: (Patient-Rptd) Yes Elevated toilet seat or a handicapped toilet?: (Patient-Rptd) Yes  TIMED UP AND GO:  Was the test performed?  No    Cognitive Function:        02/01/2023    9:23 AM 01/28/2022    2:54 PM  6CIT Screen  What Year? 0 points 0 points  What month? 0 points 0 points  What time? 0 points 0 points  Count back from 20 0 points 0 points  Months in reverse 0 points 0 points  Repeat phrase 0 points 0 points  Total Score 0 points 0 points    Immunizations Immunization History  Administered Date(s) Administered   Fluad Quad(high Dose 65+) 12/22/2018, 02/04/2021   Influenza Split 10/26/2011   Influenza, High Dose Seasonal PF 11/15/2016, 09/27/2017   Influenza,inj,Quad PF,6+ Mos 10/25/2012, 11/25/2015   Influenza-Unspecified 10/19/2014   PFIZER(Purple Top)SARS-COV-2 Vaccination 04/27/2019, 05/25/2019   Pneumococcal Conjugate-13 05/24/2016   Pneumococcal Polysaccharide-23 06/27/2017   Td 10/04/2008    TDAP status: Due, Education has been provided regarding the importance of this vaccine. Advised may receive this vaccine at local pharmacy or  Health Dept. Aware to provide a copy of the vaccination record if obtained from local pharmacy or Health Dept. Verbalized acceptance and understanding.  Flu Vaccine status: Due, Education has been provided regarding the importance of this vaccine. Advised may receive this vaccine at local pharmacy or Health Dept. Aware to provide a copy of the vaccination record if obtained from local pharmacy or Health Dept. Verbalized acceptance and understanding.  Pneumococcal vaccine status: Up to date  Covid-19 vaccine status: Declined, Education has been provided regarding the importance of this vaccine but patient still declined. Advised may receive this vaccine at local pharmacy or Health Dept.or vaccine clinic. Aware to provide a copy of the vaccination record if obtained from local pharmacy or Health Dept. Verbalized acceptance and understanding.  Qualifies for Shingles Vaccine? Yes   Zostavax completed No   Shingrix Completed?: No.    Education has been provided regarding the importance of this vaccine. Patient has been advised to call insurance company to determine out of pocket expense if they have not yet received this vaccine. Advised may also receive vaccine at local pharmacy or Health Dept. Verbalized acceptance and understanding.  Screening Tests Health Maintenance  Topic Date Due   OPHTHALMOLOGY EXAM  02/04/2023 (Originally 11/13/2020)   COVID-19 Vaccine (3 - 2024-25 season) 02/16/2023 (Originally 09/19/2022)   INFLUENZA VACCINE  04/18/2023 (Originally 08/19/2022)   Zoster Vaccines- Shingrix (1 of 2) 05/01/2023 (Originally 12/03/2000)   MAMMOGRAM  07/19/2023 (Originally 11/30/2019)   DEXA SCAN  07/19/2023 (Originally 11/18/2019)   DTaP/Tdap/Td (2 - Tdap) 01/31/2024 (Originally 10/05/2018)   Colonoscopy  07/08/2023   Diabetic kidney evaluation - Urine ACR  07/19/2023   FOOT EXAM  07/19/2023   HEMOGLOBIN A1C  07/31/2023   Diabetic kidney evaluation - eGFR measurement  01/31/2024   Medicare  Annual Wellness (AWV)  02/01/2024   Pneumonia Vaccine 59+ Years old  Completed   Hepatitis C Screening  Completed   HPV VACCINES  Aged Out    Health Maintenance  There are no preventive care reminders to display for this patient.   Colorectal cancer screening: Referral to GI placed 02/01/23. Pt aware the office will call re: appt.  Mammogram status: Completed 11/12/*2019. Repeat every year* Due 2025  Bone Density status: Completed 11/17/2016. Results reflect: Bone density results: OSTEOPENIA. Repeat every 2 years.  Lung Cancer Screening: (Low Dose CT Chest recommended if Age 45-80 years, 20 pack-year currently smoking OR have quit w/in 15years.) does not qualify.   Lung Cancer Screening Referral: N/A  Additional Screening:  Hepatitis C Screening: does qualify; Completed 04/18/2015  Vision Screening: Recommended annual ophthalmology exams for early detection of glaucoma and other disorders of  the eye. Is the patient up to date with their annual eye exam?  No  Who is the provider or what is the name of the office in which the patient attends annual eye exams? [facility] If pt is not established with a provider, would they like to be referred to a provider to establish care? No .   Dental Screening: Recommended annual dental exams for proper oral hygiene  Diabetic Foot Exam: Diabetic Foot Exam: Completed 07/19/2022  Community Resource Referral / Chronic Care Management: CRR required this visit?  No   CCM required this visit?  No     Plan:     I have personally reviewed and noted the following in the patient's chart:   Medical and social history Use of alcohol, tobacco or illicit drugs  Current medications and supplements including opioid prescriptions. Patient is not currently taking opioid prescriptions. Functional ability and status Nutritional status Physical activity Advanced directives List of other physicians Hospitalizations, surgeries, and ER  visits in previous 12 months Vitals Screenings to include cognitive, depression, and falls Referrals and appointments  In addition, I have reviewed and discussed with patient certain preventive protocols, quality metrics, and best practice recommendations. A written personalized care plan for preventive services as well as general preventive health recommendations were provided to patient.     Desarea Ohagan L Thirza Pellicano, CMA   02/01/2023   After Visit Summary: (MyChart) Due to this being a telephonic visit, the after visit summary with patients personalized plan was offered to patient via MyChart   Nurse Notes: Patient is due for a Flu and a Tdap vaccine.  She does decline the Covid and Shingrix vaccines at this time.  Patient is due for a mammogram, however she would like to hold off on placing the order for one for now.  She is due for a colonoscopy and order has been placed today.  Patient had no other concerns to address today.

## 2023-02-01 NOTE — Patient Instructions (Addendum)
 Alexis Grant , Thank you for taking time to come for your Medicare Wellness Visit. I appreciate your ongoing commitment to your health goals. Please review the following plan we discussed and let me know if I can assist you in the future.   Referrals/Orders/Follow-Ups/Clinician Recommendations: You are due for a Flu vaccine and a Tetanus vaccine.  An order has been placed for a colonoscopy today.  Please call  [name] at [phone], to schedule.  You are also due for a mammogram.  It was nice talking to you today.  Each day, aim for 6 glasses of water, plenty of protein in your diet and try to get up and walk/ stretch every hour for 5-10 minutes at a time.    This is a list of the screening recommended for you and due dates:  Health Maintenance  Topic Date Due   Eye exam for diabetics  02/04/2023*   COVID-19 Vaccine (3 - 2024-25 season) 02/16/2023*   Flu Shot  04/18/2023*   Zoster (Shingles) Vaccine (1 of 2) 05/01/2023*   Mammogram  07/19/2023*   DEXA scan (bone density measurement)  07/19/2023*   DTaP/Tdap/Td vaccine (2 - Tdap) 01/31/2024*   Colon Cancer Screening  07/08/2023   Yearly kidney health urinalysis for diabetes  07/19/2023   Complete foot exam   07/19/2023   Hemoglobin A1C  07/31/2023   Yearly kidney function blood test for diabetes  01/31/2024   Medicare Annual Wellness Visit  02/01/2024   Pneumonia Vaccine  Completed   Hepatitis C Screening  Completed   HPV Vaccine  Aged Out  *Topic was postponed. The date shown is not the original due date.    Advanced directives: (Copy Requested) Please bring a copy of your health care power of attorney and living will to the office to be added to your chart at your convenience.  Next Medicare Annual Wellness Visit scheduled for next year: Yes

## 2023-02-02 ENCOUNTER — Encounter: Payer: Self-pay | Admitting: Internal Medicine

## 2023-02-03 ENCOUNTER — Other Ambulatory Visit: Payer: Self-pay

## 2023-02-03 DIAGNOSIS — E1151 Type 2 diabetes mellitus with diabetic peripheral angiopathy without gangrene: Secondary | ICD-10-CM

## 2023-02-03 MED ORDER — SEMAGLUTIDE (2 MG/DOSE) 8 MG/3ML ~~LOC~~ SOPN: 2.0000 mg | PEN_INJECTOR | SUBCUTANEOUS | 1 refills | Status: DC

## 2023-02-04 ENCOUNTER — Ambulatory Visit: Payer: Medicare HMO | Admitting: Neurology

## 2023-02-04 DIAGNOSIS — E119 Type 2 diabetes mellitus without complications: Secondary | ICD-10-CM | POA: Diagnosis not present

## 2023-02-04 DIAGNOSIS — H40013 Open angle with borderline findings, low risk, bilateral: Secondary | ICD-10-CM | POA: Diagnosis not present

## 2023-02-04 DIAGNOSIS — H04123 Dry eye syndrome of bilateral lacrimal glands: Secondary | ICD-10-CM | POA: Diagnosis not present

## 2023-02-04 DIAGNOSIS — G43E09 Chronic migraine with aura, not intractable, without status migrainosus: Secondary | ICD-10-CM

## 2023-02-04 DIAGNOSIS — H524 Presbyopia: Secondary | ICD-10-CM | POA: Diagnosis not present

## 2023-02-04 DIAGNOSIS — H26493 Other secondary cataract, bilateral: Secondary | ICD-10-CM | POA: Diagnosis not present

## 2023-02-04 LAB — HM DIABETES EYE EXAM

## 2023-02-04 MED ORDER — ONABOTULINUMTOXINA 100 UNITS IJ SOLR
200.0000 [IU] | Freq: Once | INTRAMUSCULAR | Status: AC
  Administered 2023-02-04: 155 [IU] via INTRAMUSCULAR

## 2023-02-04 NOTE — Progress Notes (Signed)
[facility]  ° °Procedure Note Botox ° °Attending: Dr.   ° °Preoperative Diagnosis(es): Chronic migraine ° °Consent obtained from: The patient °Benefits discussed included, but were not limited to decreased muscle tightness, increased joint range of motion, and decreased pain.  Risk discussed included, but were not limited pain and discomfort, bleeding, bruising, excessive weakness, venous thrombosis, muscle atrophy and dysphagia.  Anticipated outcomes of the procedure as well as he risks and benefits of the alternatives to the procedure, and the roles and tasks of the personnel to be involved, were discussed with the patient, and the patient consents to the procedure and agrees to proceed. A copy of the patient medication guide was given to the patient which explains the blackbox warning. ° °Patients identity and treatment sites confirmed Yes.  . ° °Details of Procedure: °Skin was cleaned with alcohol. Prior to injection, the needle plunger was aspirated to make sure the needle was not within a blood vessel.  There was no blood retrieved on aspiration.   ° °Following is a summary of the muscles injected  And the amount of Botulinum toxin used: ° °Dilution °200 units of Botox was reconstituted with 4 ml of preservative free normal saline. °Time of reconstitution: At the time of the office visit (<30 minutes prior to injection)  ° °Injections  °155 total units of Botox was injected with a 30 gauge needle. ° °Injection Sites: °L occipitalis: 15 units- 3 sites  °R occiptalis: 15 units- 3 sites ° °L upper trapezius: 15 units- 3 sites °R upper trapezius: 15 units- 3 sits          °L paraspinal: 10 units- 2 sites °R paraspinal: 10 units- 2 sites ° °Face °L frontalis(2 injection sites):10 units   °R frontalis(2 injection sites):10 units         °L corrugator: 5 units   °R corrugator: 5 units           °Procerus: 5 units   °L temporalis: 20 units °R temporalis: 20 units  ° °Agent:  °200 units of botulinum Type  A (Onobotulinum Toxin type A) was reconstituted with 4 ml of preservative free normal saline.  °Time of reconstitution: At the time of the office visit (<30 minutes prior to injection)  ° ° ° Total injected (Units):  155 ° Total wasted (Units):  45 ° °Patient tolerated procedure well without complications.   °Reinjection is anticipated in 3 months. ° ° °

## 2023-02-07 ENCOUNTER — Other Ambulatory Visit: Payer: Self-pay | Admitting: Internal Medicine

## 2023-02-07 DIAGNOSIS — E1151 Type 2 diabetes mellitus with diabetic peripheral angiopathy without gangrene: Secondary | ICD-10-CM

## 2023-02-08 LAB — DRUG MONITOR, OPIATES,W/CONF, URINE
Codeine: >10000 ng/mL — ABNORMAL HIGH (ref <50)
Hydrocodone: 167 ng/mL — ABNORMAL HIGH (ref <50)
Hydromorphone: NEGATIVE ng/mL (ref <50)
Morphine: 8252 ng/mL — ABNORMAL HIGH (ref <50)
Norhydrocodone: 1445 ng/mL — ABNORMAL HIGH (ref <50)
Opiates: POSITIVE ng/mL — AB (ref <100)

## 2023-02-08 LAB — DRUG MONITOR, TRAMADOL,QN, URINE
Desmethyltramadol: NEGATIVE ng/mL (ref <100)
Tramadol: NEGATIVE ng/mL (ref <100)

## 2023-02-08 LAB — DRUG MONITOR,AMPHETAMINE,W/CONF, URINE
Amphetamine: NEGATIVE ng/mL (ref <250)
Amphetamines: NEGATIVE ng/mL (ref <500)
Methamphetamine: NEGATIVE ng/mL (ref <250)

## 2023-02-08 LAB — TEST AUTHORIZATION

## 2023-02-08 LAB — PRESCRIBED DRUGS,MEDMATCH(R)

## 2023-02-08 LAB — MICROALBUMIN / CREATININE URINE RATIO
Creatinine, Urine: 576 mg/dL — ABNORMAL HIGH (ref 20–275)
Microalb Creat Ratio: 29 mg/g{creat} (ref <30)
Microalb, Ur: 16.5 mg/dL

## 2023-02-08 LAB — DRUG MONITOR, BENZO,W/CONF, URINE: Benzodiazepines: NEGATIVE ng/mL (ref <100)

## 2023-02-08 LAB — DRUG MONITOR, OXYCODONE,W/CONF, URINE: Oxycodone: NEGATIVE ng/mL (ref <100)

## 2023-02-08 LAB — DM TEMPLATE

## 2023-02-08 LAB — DRUG MONITOR,BARBITURATE,W/CONF, URINE: Barbiturates: NEGATIVE ng/mL (ref <300)

## 2023-02-08 LAB — DRUG MONITOR, COCAINEMETAB, W/CONF, URINE: Cocaine Metabolite: NEGATIVE ng/mL (ref <150)

## 2023-02-21 ENCOUNTER — Ambulatory Visit (INDEPENDENT_AMBULATORY_CARE_PROVIDER_SITE_OTHER): Payer: Medicare HMO | Admitting: Neurology

## 2023-02-21 ENCOUNTER — Encounter: Payer: Self-pay | Admitting: Neurology

## 2023-02-21 VITALS — BP 113/67 | HR 93 | Ht 62.0 in | Wt 266.6 lb

## 2023-02-21 DIAGNOSIS — G40309 Generalized idiopathic epilepsy and epileptic syndromes, not intractable, without status epilepticus: Secondary | ICD-10-CM

## 2023-02-21 DIAGNOSIS — G43E09 Chronic migraine with aura, not intractable, without status migrainosus: Secondary | ICD-10-CM

## 2023-02-21 DIAGNOSIS — R251 Tremor, unspecified: Secondary | ICD-10-CM

## 2023-02-21 MED ORDER — TOPIRAMATE 200 MG PO TABS: 200.0000 mg | ORAL_TABLET | Freq: Two times a day (BID) | ORAL | 4 refills | Status: AC

## 2023-02-21 NOTE — Progress Notes (Signed)
NEUROLOGY FOLLOW UP OFFICE NOTE  Alexis Grant [id number] 1950/02/25  HISTORY OF PRESENT ILLNESS: I had the pleasure of seeing Alexis Grant in follow-up in the neurology clinic on 02/21/2023.  The patient was last seen a year ago for seizures, migraines, and tremors. She is alone in the office today.  Records and images were personally reviewed where available.  Since her last visit, she reports she has been doing pretty good overall. She denies any seizures or seizure-like symptoms since 2002. No staring/unresponsive episodes, gaps in time, olfactory/gustatory hallucinations, focal numbness/tingling/weakness, myoclonic jerks. Botox has been a miracle drug, she has not had any migraines since starting Botox. She is also on Topiramate 200mg  BID for seizure and migraine prophylaxis. The tremors are also much better, there is just a little tremor but nothing like before. It does not affect writing, using utensils. She does not sleep well, she gets an average of 5 hours of interrupted sleep. She takes an occasional nap. She falls a lot, last fall was 2 months ago without her walker. She got dizzy/lightheaded and everything went black for a minute. She knocked the microwave off the counter and spilled soup. Sometimes her balance is really bad, she is off kilter and almost falls. She has neuropathy. No neck or back pain. Last HbA1 c was 7.4, Ozempic dose was increased.    History on Initial Assessment 11/28/2014: This is a pleasant 73 yo RH woman with a history of hypertension, hyperlipidemia, diabetes, obesity, sleep apnea, CAD s/p MI, with seizures and migraines. She reports seizures started at age 73, she woke up in the hospital with no prior warning symptoms. Since then, she has had 4 generalized tonic-clonic seizures, last was in 2002. She also report 5 or 6 "little episodes" where she feels dizzy, with blurred vision, then if she sits down quickly and tries to relax, she can avoid any progression. She  would feel briefly confused. No associated olfactory/gustatory hallucinations, focal numbness/tingling/weakness, myoclonic jerks. The last time she had these episodes was at least 12 years ago. She recalls taking Neurontin initially, which did not help. Topamax in the past which caused GI symptoms. She has been taking Depakote ER 500mg  in AM, 1000mg  in PM for many years now with no side effects. She reports having brain imaging and EEG in the past which were normal, no records available for review.    She reports migraines started at age 73 She has an aura of numbness in her face, cheeks, and temple, then she becomes very sensitive to lights, sounds, and smells. She sees flashing lights then her vision becomes blurred, followed by throbbing headaches in the frontal or occipital regions with associated nausea. No associated focal numbness/tingling/weakness in the extremities. She tried Imitrex with minimal effect. A course of Prednisone   She has chronic neck pain and has been dealing with diarrhea from IBS. She had an MI in 2010.    Epilepsy Risk Factors:  There is a strong family history of seizures in her paternal grandfather, paternal aunt, nephew. Otherwise she had a normal birth and early development.  There is no history of febrile convulsions, CNS infections such as meningitis/encephalitis, significant traumatic brain injury, neurosurgical procedures.   Prior migraine preventative: Neurontin, Depakote, Topamax, Emgality  PAST MEDICAL HISTORY: Past Medical History:  Diagnosis Date   Adenomatous polyp of colon 2008   Allergy    Anemia    Anxiety    Arthritis    Blood transfusion without reported diagnosis  following knee surgery    Cataract    hx- removed both eyes   Depression    Diabetes mellitus    type II    Dyslipidemia    Fibromyalgia    Gallstones    Gastritis    GERD (gastroesophageal reflux disease)    Hemangioma    Hiatal hernia    Fibromyalgia   Hyperlipidemia     Hypertension    Hypothyroidism 2010   Dr.Deveshawr   IBS (irritable bowel syndrome)    Dr Russella Dar   Migraines    Myocardial infarct [facility]) 2010   subendocardrial, following R TKR   Obesity    OSA (obstructive sleep apnea)    cpap - does not know settings    Pneumonia    Seizure disorder (HCC)    Seizures (HCC)    last seizure was 2002 per pt 07-29-16   Sleep apnea    Urinary tract infection     MEDICATIONS: Current Outpatient Medications on File Prior to Visit  Medication Sig Dispense Refill   acetaminophen (TYLENOL) 500 MG tablet Take 1 tablet (500 mg total) by mouth every 6 (six) hours as needed. 30 tablet 3   acetaminophen-codeine (TYLENOL #3) 300-30 MG tablet TAKE 2 TABLETS TWICE DAILY AS NEEDED FOR MODERATE PAIN 120 tablet 0   albuterol (VENTOLIN HFA) 108 (90 Base) MCG/ACT inhaler Inhale 2 puffs into the lungs every 6 (six) hours as needed for wheezing or shortness of breath. 8 g 5   Alcohol Swabs (DROPSAFE ALCOHOL PREP) 70 % PADS USE AS DIRECTED TO CHECK SUGAR 100 each 3   Blood Glucose Calibration (TRUE METRIX LEVEL 1) Low SOLN Use when checking blood sugar QID and PRN as QC DX.E11.9 1 each 1   blood glucose meter kit and supplies KIT Dispense based on patient and insurance preference. Use up to four times daily as directed. (FOR E11.9). 1 each 0   Blood Glucose Monitoring Suppl (TRUE METRIX AIR GLUCOSE METER) DEVI Use to check blood glucose QID and PRN DX E11.9 1 each 1   botulinum toxin Type A (BOTOX) 200 units injection Inject 155 units IM into multiple site in the face,neck and head once every 90 days 1 each 4   cholecalciferol (VITAMIN D3) 25 MCG (1000 UNIT) tablet Take 2,000 Units by mouth daily.     levothyroxine (SYNTHROID) 75 MCG tablet TAKE 1 TABLET EVERY DAY BEFORE BREAKFAST 90 tablet 3   losartan (COZAAR) 50 MG tablet TAKE 1 TABLET EVERY DAY 90 tablet 3   psyllium (METAMUCIL) 0.52 g capsule Take 1 capsule (0.52 g total) by mouth daily. 30 capsule 1   Semaglutide, 2  MG/DOSE, 8 MG/3ML SOPN Inject 2 mg as directed once a week. 9 mL 1   sertraline (ZOLOFT) 100 MG tablet TAKE 1 AND 1/2 TABLETS EVERY DAY 135 tablet 3   simvastatin (ZOCOR) 20 MG tablet TAKE 1 TABLET AT BEDTIME 90 tablet 3   topiramate (TOPAMAX) 200 MG tablet TAKE 1 TABLET TWICE DAILY 180 tablet 0   traZODone (DESYREL) 50 MG tablet TAKE 2 TABLETS AT BEDTIME FOR SLEEP 180 tablet 3   TRUE METRIX BLOOD GLUCOSE TEST test strip TEST BLOOD SUGAR FOUR TIMES DAILY AND AS NEEDED 450 strip 3   TRUEplus Lancets 33G MISC TEST FOUR TIMES DAILY AND AS NEEDED 500 each 3   No current facility-administered medications on file prior to visit.    ALLERGIES: Allergies  Allergen Reactions   Hydrocodone-Acetaminophen Itching    Itches all over;  without rash per pt   Jardiance [Empagliflozin] Other (See Comments)    Caused UTIs   Metformin And Related Other (See Comments)    GI  symptoms    FAMILY HISTORY: Family History  Problem Relation Age of Onset   Colon polyps Mother    Ulcers Father    Colon polyps Brother    Esophageal cancer Brother    Diabetes Sister    Kidney failure Sister    Diabetes Paternal Aunt    Esophageal cancer Brother    Kidney cancer Sister    Renal cancer Sister    Colon cancer Neg Hx    Heart attack Neg Hx    Stroke Neg Hx    Cancer Neg Hx    Gallbladder disease Neg Hx    Rectal cancer Neg Hx    Stomach cancer Neg Hx     SOCIAL HISTORY: Social History   Socioeconomic History   Marital status: Widowed    Spouse name: Not on file   Number of children: 3   Years of education: college   Highest education level: Some college, no degree  Occupational History   Occupation: Retired  Tobacco Use   Smoking status: Never   Smokeless tobacco: Never  Vaping Use   Vaping status: Never Used  Substance and Sexual Activity   Alcohol use: No    Alcohol/week: 0.0 standard drinks of alcohol   Drug use: No   Sexual activity: Not Currently  Other Topics Concern   Not on  file  Social History Narrative   Illicit drug use- no   Patient does not get regular exercise due to knee.   Patient lives with 2 adult grandsons-2025   Patient son and wife lives next door.   Patient has 3 children.    Patient has some college.    Patient retired May 2014.    Social Drivers of Corporate investment banker Strain: Low Risk  (02/01/2023)   Overall Financial Resource Strain (CARDIA)    Difficulty of Paying Living Expenses: Not very hard  Food Insecurity: No Food Insecurity (02/01/2023)   Hunger Vital Sign    Worried About Running Out of Food in the Last Year: Never true    Ran Out of Food in the Last Year: Never true  Transportation Needs: No Transportation Needs (02/01/2023)   PRAPARE - Administrator, Civil Service (Medical): No    Lack of Transportation (Non-Medical): No  Physical Activity: Inactive (02/01/2023)   Exercise Vital Sign    Days of Exercise per Week: 0 days    Minutes of Exercise per Session: 0 min  Stress: No Stress Concern Present (02/01/2023)   [employer] of Occupational Health - Occupational Stress Questionnaire    Feeling of Stress : Not at all  Recent Concern: Stress - Stress Concern Present (01/27/2023)   [employer] of Occupational Health - Occupational Stress Questionnaire    Feeling of Stress : Rather much  Social Connections: Socially Isolated (02/01/2023)   Social Connection and Isolation Panel [NHANES]    Frequency of Communication with Friends and Family: More than three times a week    Frequency of Social Gatherings with Friends and Family: Once a week    Attends Religious Services: Never    Database administrator or Organizations: No    Attends Banker Meetings: Never    Marital Status: Widowed  Intimate Partner Violence: Patient Unable To Answer (02/01/2023)   Humiliation, Afraid, Rape, and  Kick questionnaire    Fear of Current or Ex-Partner: Patient unable to answer    Emotionally Abused: Patient  unable to answer    Physically Abused: Patient unable to answer    Sexually Abused: Patient unable to answer     PHYSICAL EXAM: Vitals:   02/21/23 1402  BP: 113/67  Pulse: 93  SpO2: 96%   General: No acute distress Head:  Normocephalic/atraumatic Skin/Extremities: No rash, no edema Neurological Exam: alert and awake. No aphasia or dysarthria. Fund of knowledge is appropriate. Attention and concentration are normal.   Cranial nerves: Pupils equal, round. Extraocular movements intact with no nystagmus. Visual fields full.  No facial asymmetry.  Motor: Bulk and tone normal, muscle strength 5/5 throughout with no pronator drift.   Finger to nose testing intact.  Gait slow and cautious with walker, no ataxia. No resting tremor. There is very minimal postural tremor, no action tremor.    IMPRESSION: This is a pleasant 73 yo RH woman with a history of hypertension, hyperlipidemia, obesity, sleep apnea on CPAP, CAD s/p MI, intractable migraine with aura, convulsive seizures, and tremors. She has been seizure-free since 2002 on Topiramate 200mg  BID. Tremors significantly improved off Depakote. She has had excellent response to Botox for migraine prophylaxis. She does not drive but would like to, she is aware of [place] driving laws to stop driving after a seizure until 6 months seizure-free. We discussed her falls, some likely due to neuropathy, however she also reports feeling dizzy/lightheaded, advised to monitor BP during these episodes. Follow-up in 1 year, call for any changes.   Thank you for allowing me to participate in her care.  Please do not hesitate to call for any questions or concerns.    Patrcia Dolly, M.D.   CC: Dr. Lawerance Bach

## 2023-02-21 NOTE — Patient Instructions (Signed)
Always good to see you. Looking great!  Continue Topiramate 200mg  twice a day  2. Continue Botox as scheduled  3. Check BP at home at same time every morning, then when feeling lightheaded, and review with PCP  4. Let me know if you would like to go balance therapy (especially if more falls occur!)  5. Follow-up in 1 year, call for any changes   Seizure Precautions: 1. If medication has been prescribed for you to prevent seizures, take it exactly as directed.  Do not stop taking the medicine without talking to your doctor first, even if you have not had a seizure in a long time.   2. Avoid activities in which a seizure would cause danger to yourself or to others.  Don't operate dangerous machinery, swim alone, or climb in high or dangerous places, such as on ladders, roofs, or girders.  Do not drive unless your doctor says you may.  3. If you have any warning that you may have a seizure, lay down in a safe place where you can't hurt yourself.    4.  No driving for 6 months from last seizure, as per [facility].   Please refer to the following link on the Epilepsy Foundation of America's website for more information: http://www.epilepsyfoundation.org/answerplace/Social/driving/drivingu.cfm   5.  Maintain good sleep hygiene. Avoid alcohol.  6.  Contact your doctor if you have any problems that may be related to the medicine you are taking.  7.  Call 911 and bring the patient back to the ED if:        A.  The seizure lasts longer than 5 minutes.       B.  The patient doesn't awaken shortly after the seizure  C.  The patient has new problems such as difficulty seeing, speaking or moving  D.  The patient was injured during the seizure  E.  The patient has a temperature over 102 F (39C)  F.  The patient vomited and now is having trouble breathing

## 2023-02-22 DIAGNOSIS — H40013 Open angle with borderline findings, low risk, bilateral: Secondary | ICD-10-CM | POA: Diagnosis not present

## 2023-03-02 ENCOUNTER — Telehealth: Payer: Self-pay | Admitting: Internal Medicine

## 2023-03-02 ENCOUNTER — Other Ambulatory Visit: Payer: Self-pay | Admitting: Internal Medicine

## 2023-03-02 NOTE — Telephone Encounter (Signed)
Duplicate message.  Request received today.

## 2023-03-02 NOTE — Telephone Encounter (Signed)
Copied from CRM [id number]. Topic: General - Other >> Mar 02, 2023  2:20 PM Truddie Crumble wrote: Reason for CRM: amber from [facility] called wanting to see if the doctor has the fax that they sent in for the patient refill on acetaminophen-codeine (TYLENOL #3) 300-30 MG tablet and If not the patient is requesting a  refill of this medication CB [phone] ext [phone]

## 2023-03-04 ENCOUNTER — Other Ambulatory Visit: Payer: Self-pay | Admitting: Nurse Practitioner

## 2023-03-04 ENCOUNTER — Telehealth: Payer: Self-pay | Admitting: Internal Medicine

## 2023-03-04 MED ORDER — ACETAMINOPHEN-CODEINE 300-30 MG PO TABS: ORAL_TABLET | ORAL | 0 refills | Status: DC

## 2023-03-04 NOTE — Telephone Encounter (Signed)
Copied from CRM [id number]. Topic: Clinical - Prescription Issue >> Mar 04, 2023  9:48 AM Florestine Avers wrote: Reason for CRM: Patient states that she thinks her acetaminophen-codeine (TYLENOL #3) 300-30 MG tablet was sent to her mail order pharmacy, she is requesting [facility] [address] which she also [facility] on file. Please call patient back as well.

## 2023-03-06 ENCOUNTER — Encounter: Payer: Self-pay | Admitting: Internal Medicine

## 2023-03-06 ENCOUNTER — Other Ambulatory Visit: Payer: Self-pay | Admitting: Nurse Practitioner

## 2023-03-08 ENCOUNTER — Telehealth: Payer: Self-pay

## 2023-03-08 NOTE — Telephone Encounter (Signed)
Copied from CRM [id number]. Topic: Clinical - Medication Question >> Mar 08, 2023 12:13 PM Dennison Nancy wrote: Reason for CRM: want to leave a message for karla reason need to talk regarding her medication acetaminophen-codeine (TYLENOL #3) 300-30 MG tablet we been discussing back and forth regarding the medication

## 2023-03-09 ENCOUNTER — Other Ambulatory Visit: Payer: Self-pay | Admitting: Internal Medicine

## 2023-03-09 MED ORDER — ACETAMINOPHEN-CODEINE 300-30 MG PO TABS: ORAL_TABLET | ORAL | 0 refills | Status: DC

## 2023-03-23 DIAGNOSIS — H40013 Open angle with borderline findings, low risk, bilateral: Secondary | ICD-10-CM | POA: Diagnosis not present

## 2023-03-23 DIAGNOSIS — H40053 Ocular hypertension, bilateral: Secondary | ICD-10-CM | POA: Diagnosis not present

## 2023-03-28 ENCOUNTER — Other Ambulatory Visit: Payer: Self-pay | Admitting: Internal Medicine

## 2023-03-30 ENCOUNTER — Encounter: Payer: Self-pay | Admitting: Internal Medicine

## 2023-03-31 ENCOUNTER — Other Ambulatory Visit: Payer: Self-pay | Admitting: Internal Medicine

## 2023-04-22 ENCOUNTER — Other Ambulatory Visit: Payer: Self-pay | Admitting: Internal Medicine

## 2023-05-13 ENCOUNTER — Ambulatory Visit: Payer: Medicare HMO | Admitting: Neurology

## 2023-05-19 DIAGNOSIS — H524 Presbyopia: Secondary | ICD-10-CM | POA: Diagnosis not present

## 2023-05-27 ENCOUNTER — Ambulatory Visit: Admitting: Neurology

## 2023-05-27 DIAGNOSIS — G43E09 Chronic migraine with aura, not intractable, without status migrainosus: Secondary | ICD-10-CM | POA: Diagnosis not present

## 2023-05-27 NOTE — Progress Notes (Unsigned)

## 2023-05-30 DIAGNOSIS — G43E09 Chronic migraine with aura, not intractable, without status migrainosus: Secondary | ICD-10-CM | POA: Diagnosis not present

## 2023-05-30 MED ORDER — ONABOTULINUMTOXINA 100 UNITS IJ SOLR
200.0000 [IU] | Freq: Once | INTRAMUSCULAR | Status: AC
  Administered 2023-05-30: 155 [IU] via INTRAMUSCULAR

## 2023-07-22 ENCOUNTER — Other Ambulatory Visit: Payer: Self-pay | Admitting: Internal Medicine

## 2023-07-27 ENCOUNTER — Other Ambulatory Visit: Payer: Self-pay | Admitting: Internal Medicine

## 2023-07-27 NOTE — Telephone Encounter (Signed)
 Copied from CRM [id number]. Topic: Clinical - Medication Refill >> Jul 27, 2023 11:54 AM Martinique E wrote: Medication: acetaminophen -codeine  (TYLENOL  #3) 300-30 MG tablet  Has the patient contacted their pharmacy? Yes (Agent: If no, request that the patient contact the pharmacy for the refill. If patient does not wish to contact the pharmacy document the reason why and proceed with request.) (Agent: If yes, when and what did the pharmacy advise?)  This is the patient's preferred pharmacy:  [facility] - [place], [place] - [address] [place] [address] Phone: [phone] Fax: [phone]   Is this the correct pharmacy for this prescription? Yes If no, delete pharmacy and type the correct one.   Has the prescription been filled recently? No  Is the patient out of the medication? No, 2 days left.  Has the patient been seen for an appointment in the last year OR does the patient have an upcoming appointment? Yes  Can we respond through MyChart? Yes  Agent: Please be advised that Rx refills may take up to 3 business days. We ask that you follow-up with your pharmacy.

## 2023-08-01 ENCOUNTER — Encounter: Payer: Medicare HMO | Admitting: Internal Medicine

## 2023-08-03 ENCOUNTER — Other Ambulatory Visit: Payer: Self-pay | Admitting: Internal Medicine

## 2023-08-03 MED ORDER — ACETAMINOPHEN-CODEINE 300-30 MG PO TABS: ORAL_TABLET | ORAL | 0 refills | Status: DC

## 2023-08-03 NOTE — Telephone Encounter (Signed)
 Copied from CRM [id number]. Topic: Clinical - Medication Refill >> Aug 03, 2023 10:51 AM Tanazia G wrote: Medication:  acetaminophen -codeine  (TYLENOL  #3) 300-30 MG tablet    Has the patient contacted their pharmacy? Yes (Agent: If no, request that the patient contact the pharmacy for the refill. If patient does not wish to contact the pharmacy document the reason why and proceed with request.) (Agent: If yes, when and what did the pharmacy advise?)  This is the patient's preferred pharmacy:  [phone] [place] [place] [place] - [address] [place] [address] Phone: [phone] Fax: [phone]  Is this the correct pharmacy for this prescription? Yes If no, delete pharmacy and type the correct one.   Has the prescription been filled recently? Yes  Is the patient out of the medication? Yes  Has the patient been seen for an appointment in the last year OR does the patient have an upcoming appointment? Yes  Can we respond through MyChart? Yes  Agent: Please be advised that Rx refills may take up to 3 business days. We ask that you follow-up with your pharmacy.

## 2023-08-04 ENCOUNTER — Other Ambulatory Visit: Payer: Self-pay | Admitting: Internal Medicine

## 2023-08-04 DIAGNOSIS — E1151 Type 2 diabetes mellitus with diabetic peripheral angiopathy without gangrene: Secondary | ICD-10-CM

## 2023-08-12 ENCOUNTER — Ambulatory Visit: Admitting: Neurology

## 2023-08-18 ENCOUNTER — Telehealth: Payer: Self-pay

## 2023-08-18 MED ORDER — BOTOX 200 UNITS IJ SOLR: INTRAMUSCULAR | 4 refills | Status: DC

## 2023-08-18 NOTE — Telephone Encounter (Signed)
 New script needed. New script sent to [facility]. Please call back to check.

## 2023-08-26 ENCOUNTER — Ambulatory Visit: Admitting: Neurology

## 2023-08-26 DIAGNOSIS — G43E09 Chronic migraine with aura, not intractable, without status migrainosus: Secondary | ICD-10-CM

## 2023-08-26 MED ORDER — ONABOTULINUMTOXINA 100 UNITS IJ SOLR
200.0000 [IU] | Freq: Once | INTRAMUSCULAR | Status: AC
  Administered 2023-08-26: 155 [IU] via INTRAMUSCULAR

## 2023-08-26 NOTE — Progress Notes (Signed)

## 2023-08-30 ENCOUNTER — Other Ambulatory Visit: Payer: Self-pay | Admitting: Internal Medicine

## 2023-09-26 ENCOUNTER — Other Ambulatory Visit: Payer: Self-pay | Admitting: Internal Medicine

## 2023-09-26 NOTE — Telephone Encounter (Signed)
Prescription refused - overdue for appointment with me.  Please call patient to schedule a follow up appointment or physical exam.   Ok to send in small supply of medication if needed to get them to their appointment.

## 2023-09-27 ENCOUNTER — Telehealth: Payer: Self-pay

## 2023-10-04 ENCOUNTER — Telehealth: Payer: Self-pay | Admitting: Internal Medicine

## 2023-10-04 NOTE — Patient Instructions (Addendum)
   Flu immunization administered today.     Blood work was ordered.       Medications changes include :   myrbetriq  for the bladder increase pain meds to 3 three times a day     Return in about 6 months (around 04/03/2024) for Physical Exam.

## 2023-10-04 NOTE — Progress Notes (Unsigned)
 Subjective:    Patient ID: Alexis Grant, female    DOB: 1950/03/10, 73 y.o.   MRN: [id number]     HPI Hisae is here for follow up of her chronic medical problems.  Fell last week.  She was pushing her mattress and fell.  She is still sore.  She fell hard.  She does not feel that she broke anything and has been weak ambulating around.  She denies any skin tears.  She does feel sore all over.  Pain medication helps.  She still has significant pain.  She does not leave her house much because of the pain and difficulty moving around.   Terrible incontinence - has to get up every 2 hrs to urinate - she can not go back to sleep - lays awake for a while.  Has incontinence.  Wears diapers all the time.    Medications and allergies reviewed with patient and updated if appropriate.  Current Outpatient Medications on File Prior to Visit  Medication Sig Dispense Refill   acetaminophen  (TYLENOL ) 500 MG tablet Take 1 tablet (500 mg total) by mouth every 6 (six) hours as needed. 30 tablet 3   acetaminophen -codeine  (TYLENOL  #3) 300-30 MG tablet TAKE 2 TABLETS TWICE DAILY AS NEEDED FOR MODERATE PAIN 120 tablet 0   albuterol  (VENTOLIN  HFA) 108 (90 Base) MCG/ACT inhaler Inhale 2 puffs into the lungs every 6 (six) hours as needed for wheezing or shortness of breath. 8 g 5   Alcohol Swabs (DROPSAFE ALCOHOL PREP) 70 % PADS USE AS DIRECTED TO CHECK SUGAR 400 each 2   Blood Glucose Calibration (TRUE METRIX LEVEL 1) Low SOLN Use when checking blood sugar QID and PRN as QC DX.E11.9 1 each 1   blood glucose meter kit and supplies KIT Dispense based on patient and insurance preference. Use up to four times daily as directed. (FOR E11.9). 1 each 0   Blood Glucose Monitoring Suppl (TRUE METRIX AIR GLUCOSE METER) DEVI Use to check blood glucose QID and PRN DX E11.9 1 each 1   botulinum toxin Type A  (BOTOX ) 200 units injection Inject 155 units IM into multiple site in the face,neck and head once every 90  days 1 each 4   cholecalciferol (VITAMIN D3) 25 MCG (1000 UNIT) tablet Take 2,000 Units by mouth daily.     levothyroxine  (SYNTHROID ) 75 MCG tablet TAKE 1 TABLET EVERY DAY BEFORE BREAKFAST 90 tablet 3   losartan  (COZAAR ) 50 MG tablet TAKE 1 TABLET EVERY DAY 90 tablet 3   psyllium (METAMUCIL) 0.52 g capsule Take 1 capsule (0.52 g total) by mouth daily. 30 capsule 1   Semaglutide , 2 MG/DOSE, (OZEMPIC , 2 MG/DOSE,) 8 MG/3ML SOPN INJECT 2MG  UNDER THE SKIN ONE TIME WEEKLY AS DIRECTED 9 mL 3   sertraline  (ZOLOFT ) 100 MG tablet TAKE 1 AND 1/2 TABLETS EVERY DAY 135 tablet 3   simvastatin  (ZOCOR ) 20 MG tablet TAKE 1 TABLET AT BEDTIME 90 tablet 3   topiramate  (TOPAMAX ) 200 MG tablet Take 1 tablet (200 mg total) by mouth 2 (two) times daily. 180 tablet 4   traZODone  (DESYREL ) 50 MG tablet TAKE 2 TABLETS AT BEDTIME FOR SLEEP 180 tablet 3   TRUE METRIX BLOOD GLUCOSE TEST test strip TEST BLOOD SUGAR FOUR TIMES DAILY AND AS NEEDED 450 strip 3   TRUEplus Lancets 33G MISC TEST FOUR TIMES DAILY AND AS NEEDED 500 each 3   No current facility-administered medications on file prior to visit.  Review of Systems  Constitutional:  Negative for fever.  Respiratory:  Negative for cough, shortness of breath and wheezing.   Cardiovascular:  Negative for chest pain, palpitations and leg swelling.  Neurological:  Positive for dizziness (occ if stands too quick) and light-headedness (occ if stands too quick). Negative for headaches.       Objective:   Vitals:   10/05/23 1015  BP: 122/80  Pulse: 85  Temp: 98.1 F (36.7 C)  SpO2: 96%   BP Readings from Last 3 Encounters:  10/05/23 122/80  02/21/23 113/67  01/31/23 112/74   Wt Readings from Last 3 Encounters:  10/05/23 274 lb 9.6 oz (124.6 kg)  02/21/23 266 lb 9.6 oz (120.9 kg)  02/01/23 263 lb (119.3 kg)   Body mass index is 50.22 kg/m.    Physical Exam Constitutional:      General: She is not in acute distress.    Appearance: Normal appearance.   HENT:     Head: Normocephalic and atraumatic.  Eyes:     Conjunctiva/sclera: Conjunctivae normal.  Cardiovascular:     Rate and Rhythm: Normal rate and regular rhythm.     Heart sounds: Normal heart sounds.  Pulmonary:     Effort: Pulmonary effort is normal. No respiratory distress.     Breath sounds: Normal breath sounds. No wheezing.  Musculoskeletal:     Cervical back: Neck supple.     Right lower leg: No edema.     Left lower leg: No edema.  Lymphadenopathy:     Cervical: No cervical adenopathy.  Skin:    General: Skin is warm and dry.     Findings: No rash.  Neurological:     Mental Status: She is alert. Mental status is at baseline.  Psychiatric:        Mood and Affect: Mood normal.        Behavior: Behavior normal.        Lab Results  Component Value Date   WBC 7.3 07/19/2022   HGB 13.5 07/19/2022   HCT 41.4 07/19/2022   PLT 244.0 07/19/2022   GLUCOSE 128 (H) 01/31/2023   CHOL 192 01/31/2023   TRIG 128.0 01/31/2023   HDL 61.80 01/31/2023   LDLCALC 104 (H) 01/31/2023   ALT 11 01/31/2023   AST 13 01/31/2023   NA 137 01/31/2023   K 3.8 01/31/2023   CL 104 01/31/2023   CREATININE 0.80 01/31/2023   BUN 10 01/31/2023   CO2 25 01/31/2023   TSH 1.50 01/31/2023   INR 1.17 11/09/2008   HGBA1C 7.4 (H) 01/31/2023   MICROALBUR 16.5 01/31/2023     Assessment & Plan:    See Problem List for Assessment and Plan of chronic medical problems.

## 2023-10-04 NOTE — Telephone Encounter (Unsigned)
 Copied from CRM [id number]. Topic: Clinical - Medication Refill >> Oct 04, 2023  4:39 PM Kevelyn M wrote: Medication: traZODone  (DESYREL ) 50 MG tablet   Has the patient contacted their pharmacy? Yes (Agent: If no, request that the patient contact the pharmacy for the refill. If patient does not wish to contact the pharmacy document the reason why and proceed with request.) (Agent: If yes, when and what did the pharmacy advise?)  This is the patient's preferred pharmacy:  [facility] - [place], [place] - [address] [place] [address] Phone: [phone] Fax: [phone]  [facility] [address] [place], [place] - [address] [place] [address] Phone: [phone] Fax: [phone]  [facility] - [place], [place] - [address] [place] [address] Phone: [phone] Fax: [phone]  Is this the correct pharmacy for this prescription? Yes If no, delete pharmacy and type the correct one.   Has the prescription been filled recently? No  Is the patient out of the medication? Not sure  Has the patient been seen for an appointment in the last year OR does the patient have an upcoming appointment? Yes  Can we respond through MyChart? No  Agent: Please be advised that Rx refills may take up to 3 business days. We ask that you follow-up with your pharmacy.

## 2023-10-05 ENCOUNTER — Ambulatory Visit (INDEPENDENT_AMBULATORY_CARE_PROVIDER_SITE_OTHER): Admitting: Internal Medicine

## 2023-10-05 VITALS — BP 122/80 | HR 85 | Temp 98.1°F | Wt 274.6 lb

## 2023-10-05 DIAGNOSIS — F3289 Other specified depressive episodes: Secondary | ICD-10-CM

## 2023-10-05 DIAGNOSIS — E039 Hypothyroidism, unspecified: Secondary | ICD-10-CM | POA: Diagnosis not present

## 2023-10-05 DIAGNOSIS — G43109 Migraine with aura, not intractable, without status migrainosus: Secondary | ICD-10-CM

## 2023-10-05 DIAGNOSIS — E559 Vitamin D deficiency, unspecified: Secondary | ICD-10-CM

## 2023-10-05 DIAGNOSIS — G40309 Generalized idiopathic epilepsy and epileptic syndromes, not intractable, without status epilepticus: Secondary | ICD-10-CM

## 2023-10-05 DIAGNOSIS — E785 Hyperlipidemia, unspecified: Secondary | ICD-10-CM

## 2023-10-05 DIAGNOSIS — I152 Hypertension secondary to endocrine disorders: Secondary | ICD-10-CM | POA: Diagnosis not present

## 2023-10-05 DIAGNOSIS — E1159 Type 2 diabetes mellitus with other circulatory complications: Secondary | ICD-10-CM

## 2023-10-05 DIAGNOSIS — M160 Bilateral primary osteoarthritis of hip: Secondary | ICD-10-CM | POA: Diagnosis not present

## 2023-10-05 DIAGNOSIS — Z23 Encounter for immunization: Secondary | ICD-10-CM | POA: Diagnosis not present

## 2023-10-05 DIAGNOSIS — E1169 Type 2 diabetes mellitus with other specified complication: Secondary | ICD-10-CM | POA: Diagnosis not present

## 2023-10-05 DIAGNOSIS — G8929 Other chronic pain: Secondary | ICD-10-CM

## 2023-10-05 DIAGNOSIS — G479 Sleep disorder, unspecified: Secondary | ICD-10-CM

## 2023-10-05 DIAGNOSIS — N3281 Overactive bladder: Secondary | ICD-10-CM

## 2023-10-05 MED ORDER — ACETAMINOPHEN-CODEINE 300-30 MG PO TABS: ORAL_TABLET | ORAL | 0 refills | Status: DC

## 2023-10-05 MED ORDER — TRAZODONE HCL 100 MG PO TABS: 100.0000 mg | ORAL_TABLET | Freq: Every day | ORAL | 1 refills | Status: AC

## 2023-10-05 MED ORDER — MIRABEGRON ER 25 MG PO TB24: 25.0000 mg | ORAL_TABLET | Freq: Every day | ORAL | 5 refills | Status: DC

## 2023-10-05 NOTE — Assessment & Plan Note (Addendum)
 Chronic Not ideally controlled, but increasing her medication or changing her medication is unlikely to help Depression stems from her chronic pain and not being able to do much because of it Continue sertraline  150 mg daily

## 2023-10-05 NOTE — Assessment & Plan Note (Signed)
 Chronic Controlled Following with neurology On topamax 200 mg bid Getting Botox

## 2023-10-05 NOTE — Assessment & Plan Note (Addendum)
 Chronic associated with hyperlipidemia  Lab Results  Component Value Date   HGBA1C 7.4 (H) 01/31/2023   Sugars not ideally controlled Check A1c, urine albumin/creatinine ratio Continue Ozempic  2 mg weekly Stressed diabetic diet

## 2023-10-05 NOTE — Assessment & Plan Note (Addendum)
 Chronic Sleep is still not great-Gets interrupted sleep-mostly because she has been to the bathroom frequently and then cannot fall back asleep afterwards We will try medication for her overactive bladder to see if that helps Continue trazodone  100 mg nightly

## 2023-10-05 NOTE — Assessment & Plan Note (Addendum)
 Chronic Symptoms worse at night-frequent urination often only going small amount and significant disturbance to her sleep oxybutynin   - not effective in the past Trial of Myrbetriq  25 mg daily

## 2023-10-05 NOTE — Assessment & Plan Note (Addendum)
 Chronic Severe bilateral hip pain from primary osteoarthritis-she does not want surgery Pain is significant and definitely has an impact on her quality of life-she is not able to do very much and barely leaves her home Pain medication does help, but still has significant pain Continue Tylenol  # 3-Will increase to 2 tablets 3 times daily

## 2023-10-05 NOTE — Assessment & Plan Note (Signed)
 Chronic  Clinically euthyroid Check tsh and will titrate med dose if needed Currently taking levothyroxine  75 mcg daily

## 2023-10-05 NOTE — Assessment & Plan Note (Signed)
 Chronic Taking vitamin D daily Check vitamin D level

## 2023-10-05 NOTE — Assessment & Plan Note (Addendum)
 Chronic severe bilateral hip pain from end-stage primary osteoarthritis Does not want surgery Pain management with Tylenol  # 3 2 tabs twice daily-this does help, but she is on a very low dose of pain medication and her pain is significant and would benefit from a higher dose Increase Tylenol  3 to 2 tabs 3 times daily Pain contract signed 01/2023 [place]  pain management database checked.  She is taking the medication appropriately Denies side effects

## 2023-10-05 NOTE — Assessment & Plan Note (Addendum)
 Chronic Blood pressure controlled CMP, CBC Continue losartan 50 mg daily

## 2023-10-05 NOTE — Assessment & Plan Note (Addendum)
 Chronic Has lost significant amount of weight with Ozempic , but is starting to gain some of the weight back which is likely related to her severe hip pain

## 2023-10-05 NOTE — Assessment & Plan Note (Signed)
Chronic Regular exercise and healthy diet encouraged Check lipid panel, CMP Continue simvastatin 20 mg daily 

## 2023-10-05 NOTE — Assessment & Plan Note (Signed)
 Chronic Following with neurology Has been seizure-free since 2002 on topiramate 200 mg twice daily

## 2023-10-10 ENCOUNTER — Ambulatory Visit: Admitting: Internal Medicine

## 2023-10-10 NOTE — Telephone Encounter (Signed)
 Addressed.

## 2023-10-22 ENCOUNTER — Other Ambulatory Visit: Payer: Self-pay | Admitting: Internal Medicine

## 2023-10-31 ENCOUNTER — Other Ambulatory Visit: Payer: Self-pay | Admitting: Internal Medicine

## 2023-10-31 NOTE — Telephone Encounter (Unsigned)
 Copied from CRM [id number]. Topic: Clinical - Medication Refill >> Oct 31, 2023  3:21 PM Shereese L wrote: Medication: acetaminophen -codeine  (TYLENOL  #3) 300-30 MG tablet  Has the patient contacted their pharmacy? Yes (Agent: If no, request that the patient contact the pharmacy for the refill. If patient does not wish to contact the pharmacy document the reason why and proceed with request.) (Agent: If yes, when and what did the pharmacy advise?)  This is the patient's preferred pharmacy:  [facility] - [place], [place] - [address] [place] [address] Phone: [phone] Fax: [phone]   Is this the correct pharmacy for this prescription? Yes If no, delete pharmacy and type the correct one.   Has the prescription been filled recently? Yes  Is the patient out of the medication? Yes  Has the patient been seen for an appointment in the last year OR does the patient have an upcoming appointment? Yes  Can we respond through MyChart? Yes  Agent: Please be advised that Rx refills may take up to 3 business days. We ask that you follow-up with your pharmacy.

## 2023-11-01 MED ORDER — ACETAMINOPHEN-CODEINE 300-30 MG PO TABS: ORAL_TABLET | ORAL | 0 refills | Status: DC

## 2023-11-11 ENCOUNTER — Telehealth: Payer: Self-pay | Admitting: Neurology

## 2023-11-11 NOTE — Telephone Encounter (Signed)
 Who's calling (name and relationship to patient) : Pharmacy; [facility]  Best contact number: declined  Provider they see: Dr. Skeet  Reason for call:  Caller is needing to schedule Botox  delivery

## 2023-11-11 NOTE — Telephone Encounter (Signed)
 Called patient specialty pharmacy [facility] and BOTOX  delivery has been set up on November 17, 2023.

## 2023-11-25 ENCOUNTER — Ambulatory Visit: Admitting: Neurology

## 2023-11-25 ENCOUNTER — Encounter: Payer: Self-pay | Admitting: Neurology

## 2023-12-01 ENCOUNTER — Other Ambulatory Visit: Payer: Self-pay | Admitting: Internal Medicine

## 2023-12-12 ENCOUNTER — Telehealth: Payer: Self-pay

## 2023-12-12 NOTE — Telephone Encounter (Signed)
 As they probably know she has been on the medication for years and is doing okay so yes I approve the refill

## 2023-12-12 NOTE — Telephone Encounter (Signed)
 Copied from CRM [id number]. Topic: Clinical - Prescription Issue >> Dec 12, 2023 11:40 AM Darshell M wrote: Reason for CRM: Patient chart updated on 10/30 to indicate she is allergic to morphine  and codeine . [facility] pharmacy calling to determine if Dr. Geofm is aware of patient allergy and does she still want to refill acetaminophen  with codeine . [facility] pharmacy [phone]

## 2023-12-12 NOTE — Telephone Encounter (Signed)
 Copied from CRM [id number]. Topic: Clinical - Prescription Issue >> Dec 12, 2023 10:51 AM Viola F wrote: Reason for CRM: Patient called to follow up on refill for the acetaminophen -codeine  (TYLENOL  #3) 300-30 MG tablet - she knows that the script was sent 12/02/23 but [facility] says their waiting for questions to be answered by provider regarding patients allergies in order to release the medication. [facility] phone number is [phone]

## 2023-12-13 NOTE — Telephone Encounter (Signed)
 Spoke with Lauraine today, Pharmacist and she was able to complete over-ride for medication to be sent out.

## 2024-01-01 ENCOUNTER — Other Ambulatory Visit: Payer: Self-pay | Admitting: Internal Medicine

## 2024-01-31 ENCOUNTER — Other Ambulatory Visit: Payer: Self-pay | Admitting: Internal Medicine

## 2024-02-02 ENCOUNTER — Telehealth: Payer: Self-pay | Admitting: Neurology

## 2024-02-02 ENCOUNTER — Ambulatory Visit: Payer: Medicare HMO

## 2024-02-02 VITALS — Ht 62.0 in | Wt 274.0 lb

## 2024-02-02 DIAGNOSIS — Z0000 Encounter for general adult medical examination without abnormal findings: Secondary | ICD-10-CM

## 2024-02-02 DIAGNOSIS — Z1212 Encounter for screening for malignant neoplasm of rectum: Secondary | ICD-10-CM | POA: Diagnosis not present

## 2024-02-02 DIAGNOSIS — Z1211 Encounter for screening for malignant neoplasm of colon: Secondary | ICD-10-CM

## 2024-02-02 NOTE — Telephone Encounter (Signed)
 [facility] is asking for new PA for 2026 [id number] for Botox 

## 2024-02-02 NOTE — Patient Instructions (Addendum)
 Alexis Grant,  Thank you for taking the time for your Medicare Wellness Visit. I appreciate your continued commitment to your health goals. Please review the care plan we discussed, and feel free to reach out if I can assist you further.  Please note that Annual Wellness Visits do not include a physical exam. Some assessments may be limited, especially if the visit was conducted virtually. If needed, we may recommend an in-person follow-up with your provider.  Ongoing Care Seeing your primary care provider every 3 to 6 months helps us  monitor your health and provide consistent, personalized care. Next office visit on 04/06/2024.  You are due for a Shingles vaccine and a tetanus vaccine. These can be given at your local pharmacy.  You are due for a foot exam, a kidney evaluation and a A1C check, which will all be done during your next office visit. Aim for 30 minutes of exercise or brisk walking, 6-8 glasses of water, and 5 servings of fruits and vegetables each day.   Referrals If a referral was made during today's visit and you haven't received any updates within two weeks, please contact the referred provider directly to check on the status. Please call below to schedule a colonoscopy.  [facility] [address] [place] [address] Floor [place],  [place]  [address] Main: [phone]  Recommended Screenings:  Health Maintenance  Topic Date Due   Zoster (Shingles) Vaccine (1 of 2) Never done   DTaP/Tdap/Td vaccine (2 - Tdap) 10/05/2018   Osteoporosis screening with Bone Density Scan  11/18/2019   Breast Cancer Screening  11/30/2019   Colon Cancer Screening  07/08/2023   Complete foot exam   07/19/2023   Hemoglobin A1C  07/31/2023   COVID-19 Vaccine (3 - 2025-26 season) 09/19/2023   Yearly kidney function blood test for diabetes  01/31/2024   Yearly kidney health urinalysis for diabetes  01/31/2024   Medicare Annual Wellness Visit  02/01/2024   Eye exam for diabetics  02/04/2024    Pneumococcal Vaccine for age over 74  Completed   Flu Shot  Completed   Hepatitis C Screening  Completed   Meningitis B Vaccine  Aged Out       02/21/2023    2:08 PM  Advanced Directives  Does Patient Have a Medical Advance Directive? Yes  Type of Estate Agent of [place];Living will;Out of facility DNR (pink MOST or yellow form)    Vision: Annual vision screenings are recommended for early detection of glaucoma, cataracts, and diabetic retinopathy. These exams can also reveal signs of chronic conditions such as diabetes and high blood pressure.  Dental: Annual dental screenings help detect early signs of oral cancer, gum disease, and other conditions linked to overall health, including heart disease and diabetes.  Please see the attached documents for additional preventive care recommendations.

## 2024-02-02 NOTE — Progress Notes (Signed)
 "  Chief Complaint  Patient presents with   Medicare Wellness     Subjective:   Alexis Grant is a 74 y.o. female who presents for a Medicare Annual Wellness Visit.  Visit info / Clinical Intake: Medicare Wellness Visit Type:: Subsequent Annual Wellness Visit Persons participating in visit and providing information:: patient Medicare Wellness Visit Mode:: Video Since this visit was completed virtually, some vitals may be partially provided or unavailable. Missing vitals are due to the limitations of the virtual format.: Documented vitals are patient reported If Telephone or Video please confirm:: I connected with patient using audio/video enable telemedicine. I verified patient identity with two identifiers, discussed telehealth limitations, and patient agreed to proceed. Patient Location:: Home Provider Location:: Home Interpreter Needed?: No Pre-visit prep was completed: yes AWV questionnaire completed by patient prior to visit?: yes Date:: 01/29/24 Living arrangements:: (!) (Patient-Rptd) lives alone Patient's Overall Health Status Rating: (Patient-Rptd) good Typical amount of pain: (!) (Patient-Rptd) a lot Does pain affect daily life?: (!) (Patient-Rptd) yes Are you currently prescribed opioids?: no  Dietary Habits and Nutritional Risks How many meals a day?: (!) (Patient-Rptd) 1 Eats fruit and vegetables daily?: (Patient-Rptd) yes Most meals are obtained by: (Patient-Rptd) preparing own meals In the last 2 weeks, have you had any of the following?: none Diabetic:: (!) yes Any non-healing wounds?: no How often do you check your BS?: 2 Would you like to be referred to a Nutritionist or for Diabetic Management? : no  Functional Status Activities of Daily Living (to include ambulation/medication): (!) (Patient-Rptd) Needs Assist Ambulation: Independent with device- listed below Home Assistive Devices/Equipment: Walker (specify Type); Eyeglasses Medication Administration:  (Patient-Rptd) Independent Home Management (perform basic housework or laundry): (Patient-Rptd) Needs assistance (comment) Manage your own finances?: (!) (Patient-Rptd) no Primary transportation is: (Patient-Rptd) family / friends Concerns about vision?: no *vision screening is required for WTM* Concerns about hearing?: no  Fall Screening Falls in the past year?: (Patient-Rptd) 1 Number of falls in past year: (Patient-Rptd) 1 Was there an injury with Fall?: (Patient-Rptd) 0 Fall Risk Category Calculator: (Patient-Rptd) 2 Patient Fall Risk Level: (Patient-Rptd) Moderate Fall Risk  Fall Risk Patient at Risk for Falls Due to: Impaired balance/gait Fall risk Follow up: Falls evaluation completed; Falls prevention discussed  Home and Transportation Safety: All rugs have non-skid backing?: (Patient-Rptd) yes All stairs or steps have railings?: (Patient-Rptd) yes Grab bars in the bathtub or shower?: (Patient-Rptd) yes Have non-skid surface in bathtub or shower?: (!) (Patient-Rptd) no Good home lighting?: (Patient-Rptd) yes Regular seat belt use?: (Patient-Rptd) yes Hospital stays in the last year:: (Patient-Rptd) no  Cognitive Assessment Difficulty concentrating, remembering, or making decisions? : yes (memory issues) Will 6CIT or Mini Cog be Completed: yes What year is it?: 0 points What month is it?: 0 points Give patient an address phrase to remember (5 components): [address], [place], [place] About what time is it?: 0 points Count backwards from 20 to 1: 0 points Say the months of the year in reverse: 0 points Repeat the address phrase from earlier: 0 points 6 CIT Score: 0 points  Advance Directives (For Healthcare) Does Patient Have a Medical Advance Directive?: Yes Type of Advance Directive: Healthcare Power of [place]; Living will; Out of facility DNR (pink MOST or yellow form)  Reviewed/Updated  Reviewed/Updated: Reviewed All (Medical, Surgical, Family, Medications,  Allergies, Care Teams, Patient Goals)    Allergies (verified) Hydrocodone -acetaminophen , Jardiance  [empagliflozin ], and Metformin  and related   Current Medications (verified) Outpatient Encounter Medications as  of 02/02/2024  Medication Sig   acetaminophen  (TYLENOL ) 500 MG tablet Take 1 tablet (500 mg total) by mouth every 6 (six) hours as needed.   acetaminophen -codeine  (TYLENOL  #3) 300-30 MG tablet TAKE 2 TABLETS THREE TIMES DAILY AS NEEDED FOR CHRONIC HIP PAIN   Alcohol Swabs (DROPSAFE ALCOHOL PREP) 70 % PADS USE AS DIRECTED TO CHECK SUGAR   Blood Glucose Calibration (TRUE METRIX LEVEL 1) Low SOLN Use when checking blood sugar QID and PRN as QC DX.E11.9   blood glucose meter kit and supplies KIT Dispense based on patient and insurance preference. Use up to four times daily as directed. (FOR E11.9).   Blood Glucose Monitoring Suppl (TRUE METRIX AIR GLUCOSE METER) DEVI Use to check blood glucose QID and PRN DX E11.9   cholecalciferol (VITAMIN D3) 25 MCG (1000 UNIT) tablet Take 2,000 Units by mouth daily.   levothyroxine  (SYNTHROID ) 75 MCG tablet TAKE 1 TABLET EVERY DAY BEFORE BREAKFAST   losartan  (COZAAR ) 50 MG tablet TAKE 1 TABLET EVERY DAY   mirabegron  ER (MYRBETRIQ ) 25 MG TB24 tablet Take 1 tablet (25 mg total) by mouth daily.   psyllium (METAMUCIL) 0.52 g capsule Take 1 capsule (0.52 g total) by mouth daily.   Semaglutide , 2 MG/DOSE, (OZEMPIC , 2 MG/DOSE,) 8 MG/3ML SOPN INJECT 2MG  UNDER THE SKIN ONE TIME WEEKLY AS DIRECTED   sertraline  (ZOLOFT ) 100 MG tablet TAKE 1 AND 1/2 TABLETS EVERY DAY   simvastatin  (ZOCOR ) 20 MG tablet TAKE 1 TABLET AT BEDTIME   topiramate  (TOPAMAX ) 200 MG tablet Take 1 tablet (200 mg total) by mouth 2 (two) times daily.   traZODone  (DESYREL ) 100 MG tablet Take 1 tablet (100 mg total) by mouth at bedtime.   TRUE METRIX BLOOD GLUCOSE TEST test strip TEST BLOOD SUGAR FOUR TIMES DAILY AND AS NEEDED   TRUEplus Lancets 33G MISC TEST FOUR TIMES DAILY AND AS NEEDED    albuterol  (VENTOLIN  HFA) 108 (90 Base) MCG/ACT inhaler Inhale 2 puffs into the lungs every 6 (six) hours as needed for wheezing or shortness of breath.   botulinum toxin Type A  (BOTOX ) 200 units injection Inject 155 units IM into multiple site in the face,neck and head once every 90 days (Patient not taking: Reported on 02/02/2024)   traZODone  (DESYREL ) 50 MG tablet TAKE 2 TABLETS AT BEDTIME FOR SLEEP   No facility-administered encounter medications on file as of 02/02/2024.    History: Past Medical History:  Diagnosis Date   Adenomatous polyp of colon 2008   Allergy    Anemia    Anxiety    Arthritis    Blood transfusion without reported diagnosis    following knee surgery    Cataract    hx- removed both eyes   Depression    Diabetes mellitus    type II    Dyslipidemia    Fibromyalgia    Gallstones    Gastritis    GERD (gastroesophageal reflux disease)    Hemangioma    Hiatal hernia    Fibromyalgia   Hyperlipidemia    Hypertension    Hypothyroidism 2010   Dr.Deveshawr   IBS (irritable bowel syndrome)    Dr Aneita   Migraines    Myocardial infarct [facility]) 2010   subendocardrial, following R TKR   Obesity    OSA (obstructive sleep apnea)    cpap - does not know settings    Pneumonia    Seizure disorder (HCC)    Seizures (HCC)    last seizure was 2002 per pt 07-29-16  Sleep apnea    Urinary tract infection    Past Surgical History:  Procedure Laterality Date   ABDOMINAL HYSTERECTOMY  1988   CARDIAC CATHETERIZATION  2007   DrGamble ( now seeing Dr Anner ANGLES cardiology)   CHOLECYSTECTOMY  1985   COLONOSCOPY     COLONOSCOPY WITH PROPOFOL  N/A 07/16/2014   Procedure: COLONOSCOPY WITH PROPOFOL ;  Surgeon: Gwendlyn ONEIDA Buddy, MD;  Location: [facility];  Service: Endoscopy;  Laterality: N/A;   COLONOSCOPY WITH PROPOFOL  N/A 09/21/2016   Procedure: COLONOSCOPY WITH PROPOFOL ;  Surgeon: Buddy Gwendlyn ONEIDA, MD;  Location: [facility];  Service: Endoscopy;  Laterality: N/A;    POLYPECTOMY  2012   6 or 7 adenomatous, Dr.Stark   steroid injection to left si joint  12/2008   Dr.Newton    TOTAL KNEE ARTHROPLASTY Right 11/2005   TOTAL KNEE ARTHROPLASTY Left    TUBAL LIGATION  1986   Family History  Problem Relation Age of Onset   Colon polyps Mother    Ulcers Father    Colon polyps Brother    Esophageal cancer Brother    Diabetes Sister    Kidney failure Sister    Diabetes Paternal Aunt    Esophageal cancer Brother    Kidney cancer Sister    Renal cancer Sister    Colon cancer Neg Hx    Heart attack Neg Hx    Stroke Neg Hx    Cancer Neg Hx    Gallbladder disease Neg Hx    Rectal cancer Neg Hx    Stomach cancer Neg Hx    Social History   Occupational History   Occupation: Retired  Tobacco Use   Smoking status: Never   Smokeless tobacco: Never  Vaping Use   Vaping status: Never Used  Substance and Sexual Activity   Alcohol use: No    Alcohol/week: 0.0 standard drinks of alcohol   Drug use: No   Sexual activity: Not Currently   Tobacco Counseling Counseling given: Not Answered  SDOH Screenings   Food Insecurity: No Food Insecurity (01/29/2024)  Housing: Low Risk (01/29/2024)  Transportation Needs: No Transportation Needs (01/29/2024)  Utilities: Not At Risk (02/02/2024)  Alcohol Screen: Low Risk (02/01/2023)  Depression (PHQ2-9): Medium Risk (02/02/2024)  Financial Resource Strain: Low Risk (01/29/2024)  Physical Activity: Inactive (01/29/2024)  Social Connections: Moderately Isolated (01/29/2024)  Stress: Stress Concern Present (01/29/2024)  Tobacco Use: Low Risk (02/02/2024)  Health Literacy: Adequate Health Literacy (02/02/2024)   See flowsheets for full screening details  Depression Screen PHQ 2 & 9 Depression Scale- Over the past 2 weeks, how often have you been bothered by any of the following problems? Little interest or pleasure in doing things: 0 Feeling down, depressed, or hopeless (PHQ Adolescent also includes...irritable): 1  (depressed) PHQ-2 Total Score: 1 Trouble falling or staying asleep, or sleeping too much: 3 (staying asleep) Feeling tired or having little energy: 3 (no energy) Poor appetite or overeating (PHQ Adolescent also includes...weight loss): 3 (poor appetite) Feeling bad about yourself - or that you are a failure or have let yourself or your family down: 0 Trouble concentrating on things, such as reading the newspaper or watching television (PHQ Adolescent also includes...like school work): 0 Moving or speaking so slowly that other people could have noticed. Or the opposite - being so fidgety or restless that you have been moving around a lot more than usual: 0 Thoughts that you would be better off dead, or of hurting yourself in some way: 0 PHQ-9 Total Score:  10 If you checked off any problems, how difficult have these problems made it for you to do your work, take care of things at home, or get along with other people?: Very difficult  Depression Treatment Depression Interventions/Treatment : Patient refuses Treatment     Goals Addressed               This Visit's Progress     Patient Stated (pt-stated)        NOT AT THIS TIME/2026             Objective:    Today's Vitals   02/02/24 0926  Weight: 274 lb (124.3 kg)  Height: 5' 2 (1.575 m)   Body mass index is 50.12 kg/m.  Hearing/Vision screen Hearing Screening - Comments:: Denies hearing difficulties   Vision Screening - Comments:: Wears eyeglasses/Dr. VAN/UTD Immunizations and Health Maintenance Health Maintenance  Topic Date Due   Zoster Vaccines- Shingrix (1 of 2) Never done   DTaP/Tdap/Td (2 - Tdap) 10/05/2018   Bone Density Scan  11/18/2019   Mammogram  11/30/2019   Colonoscopy  07/08/2023   FOOT EXAM  07/19/2023   HEMOGLOBIN A1C  07/31/2023   COVID-19 Vaccine (3 - 2025-26 season) 09/19/2023   Diabetic kidney evaluation - eGFR measurement  01/31/2024   Diabetic kidney evaluation - Urine ACR  01/31/2024    OPHTHALMOLOGY EXAM  02/04/2024   Medicare Annual Wellness (AWV)  02/01/2025   Pneumococcal Vaccine: 50+ Years  Completed   Influenza Vaccine  Completed   Hepatitis C Screening  Completed   Meningococcal B Vaccine  Aged Out        Assessment/Plan:  This is a routine wellness examination for [employer].  Patient Care Team: Geofm Glade PARAS, MD as PCP - General (Internal Medicine) Georjean Darice HERO, MD as Consulting Physician (Neurology) Anderson Maude ORN, MD (Inactive) as Consulting Physician (Orthopedic Surgery) Shellia Oh, MD as Consulting Physician (Pulmonary Disease) Cleatus Collar, MD as Consulting Physician (Ophthalmology) Georjean Darice HERO, MD as Consulting Physician (Neurology) Szabat, Toribio BROCKS, [facility] (Inactive) (Pharmacist)  I have personally reviewed and noted the following in the patients chart:   Medical and social history Use of alcohol, tobacco or illicit drugs  Current medications and supplements including opioid prescriptions. Functional ability and status Nutritional status Physical activity Advanced directives List of other physicians Hospitalizations, surgeries, and ER visits in previous 12 months Vitals Screenings to include cognitive, depression, and falls Referrals and appointments  Orders Placed This Encounter  Procedures   Ambulatory referral to Gastroenterology    Referral Priority:   Routine    Referral Type:   Consultation    Referral Reason:   Specialty Services Required    Number of Visits Requested:   1   In addition, I have reviewed and discussed with patient certain preventive protocols, quality metrics, and best practice recommendations. A written personalized care plan for preventive services as well as general preventive health recommendations were provided to patient.   Castiel Lauricella L Laramie Meissner, CMA   02/02/2024   Return in 1 year (on 02/01/2025).  After Visit Summary: (MyChart) Due to this being a telephonic visit, the after visit summary with  patients personalized plan was offered to patient via MyChart   Nurse Notes: Patient declines a mammogram and a DEXA.  She is due for a colonoscopy and order ha been placed today.  Patient is also due for a tdap and Shingrix vaccine.  She is due for a foot exam, a A1C check, an UACR  and a eGFR, which all can be done during her next office visit.  Patient had no other concerns to address today. "

## 2024-02-03 ENCOUNTER — Telehealth: Payer: Self-pay | Admitting: Pharmacy Technician

## 2024-02-03 NOTE — Telephone Encounter (Signed)
 Pharmacy Patient Advocate Encounter   Received notification from Pt Calls Messages that prior authorization for BOTOX  200 is required/requested.   Insurance verification completed.   The patient is insured through [employer].   Per test claim: PA required; PA submitted to above mentioned insurance via Latent Key/confirmation #/EOC [id number]  Status is pending

## 2024-02-03 NOTE — Telephone Encounter (Signed)
 PA has been submitted, and telephone encounter has been created. Please see telephone encounter dated 1.16.26.

## 2024-02-08 ENCOUNTER — Other Ambulatory Visit: Payer: Self-pay | Admitting: Internal Medicine

## 2024-02-09 ENCOUNTER — Telehealth: Payer: Self-pay | Admitting: Neurology

## 2024-02-09 MED ORDER — QULIPTA 60 MG PO TABS: ORAL_TABLET | ORAL | 3 refills | Status: AC

## 2024-02-09 NOTE — Telephone Encounter (Signed)
 Our office received a denial of Medicare coverage for Botox . Patient was called and she stated she does not want to continue it because of cost. Letter stated preferred drug (which she has not tried) is Qulipta . Side effects discussed, she denies any constipation. She would like to start Qulipta .

## 2024-02-16 ENCOUNTER — Telehealth: Payer: Self-pay | Admitting: Neurology

## 2024-02-16 NOTE — Telephone Encounter (Signed)
 Spoke to [place] at [employer],  Cancelled Botox  script.

## 2024-02-16 NOTE — Telephone Encounter (Signed)
 Pt called to cancel her botox  appt for 03/02/24 since she can't pay for the botox . They wanted her to start Qulipta  and she agreed but now with her co pay card her cost is $1900.00. she isnt sure what to do. She would like a call back from someone. She states she sees Dr.Jaffe and Dr.Aquino

## 2024-02-17 NOTE — Telephone Encounter (Signed)
 We got a letter about the Botox  denial, saying she needs to do the preferred Qulipta  first. So do we need to get a prior authorization for Qulipta  to lower the cost?

## 2024-02-20 ENCOUNTER — Other Ambulatory Visit (HOSPITAL_COMMUNITY): Payer: Self-pay

## 2024-02-23 ENCOUNTER — Other Ambulatory Visit: Payer: Self-pay | Admitting: Internal Medicine

## 2024-03-02 ENCOUNTER — Ambulatory Visit: Admitting: Neurology

## 2024-04-06 ENCOUNTER — Encounter: Admitting: Internal Medicine
